# Patient Record
Sex: Male | Born: 1965 | Race: Black or African American | Hispanic: No | Marital: Married | Smoking: Never smoker
Health system: Southern US, Community
[De-identification: ages and names within clinical notes are randomized; demographics above are authoritative.]

## PROBLEM LIST (undated history)

## (undated) DIAGNOSIS — I509 Heart failure, unspecified: Secondary | ICD-10-CM

## (undated) DIAGNOSIS — I10 Essential (primary) hypertension: Secondary | ICD-10-CM

## (undated) DIAGNOSIS — N186 End stage renal disease: Secondary | ICD-10-CM

## (undated) HISTORY — PX: INCISION AND DRAINAGE: SHX5863

## (undated) HISTORY — PX: KNEE SURGERY: SHX244

## (undated) HISTORY — DX: End stage renal disease: N18.6

---

## 2002-09-08 ENCOUNTER — Encounter: Payer: Self-pay | Admitting: *Deleted

## 2002-09-08 ENCOUNTER — Emergency Department (HOSPITAL_COMMUNITY): Admission: EM | Admit: 2002-09-08 | Discharge: 2002-09-08 | Payer: Self-pay | Admitting: *Deleted

## 2003-06-20 ENCOUNTER — Emergency Department (HOSPITAL_COMMUNITY): Admission: EM | Admit: 2003-06-20 | Discharge: 2003-06-20 | Payer: Self-pay | Admitting: Emergency Medicine

## 2003-08-30 ENCOUNTER — Emergency Department (HOSPITAL_COMMUNITY): Admission: EM | Admit: 2003-08-30 | Discharge: 2003-08-30 | Payer: Self-pay | Admitting: Emergency Medicine

## 2005-04-09 ENCOUNTER — Emergency Department (HOSPITAL_COMMUNITY): Admission: EM | Admit: 2005-04-09 | Discharge: 2005-04-09 | Payer: Self-pay | Admitting: Emergency Medicine

## 2005-11-19 ENCOUNTER — Emergency Department (HOSPITAL_COMMUNITY): Admission: EM | Admit: 2005-11-19 | Discharge: 2005-11-19 | Payer: Self-pay | Admitting: *Deleted

## 2006-01-01 ENCOUNTER — Ambulatory Visit: Payer: Self-pay | Admitting: Orthopedic Surgery

## 2006-01-05 ENCOUNTER — Emergency Department (HOSPITAL_COMMUNITY): Admission: EM | Admit: 2006-01-05 | Discharge: 2006-01-05 | Payer: Self-pay | Admitting: Emergency Medicine

## 2006-01-15 ENCOUNTER — Ambulatory Visit (HOSPITAL_COMMUNITY): Admission: RE | Admit: 2006-01-15 | Discharge: 2006-01-15 | Payer: Self-pay | Admitting: Orthopedic Surgery

## 2006-02-05 ENCOUNTER — Ambulatory Visit: Payer: Self-pay | Admitting: Orthopedic Surgery

## 2006-02-19 ENCOUNTER — Encounter (HOSPITAL_COMMUNITY): Admission: RE | Admit: 2006-02-19 | Discharge: 2006-03-21 | Payer: Self-pay | Admitting: Orthopedic Surgery

## 2006-10-08 ENCOUNTER — Ambulatory Visit: Payer: Self-pay | Admitting: Orthopedic Surgery

## 2006-10-26 ENCOUNTER — Ambulatory Visit: Payer: Self-pay | Admitting: Orthopedic Surgery

## 2006-10-26 ENCOUNTER — Ambulatory Visit (HOSPITAL_COMMUNITY): Admission: RE | Admit: 2006-10-26 | Discharge: 2006-10-26 | Payer: Self-pay | Admitting: Orthopedic Surgery

## 2006-10-30 ENCOUNTER — Encounter (HOSPITAL_COMMUNITY): Admission: RE | Admit: 2006-10-30 | Discharge: 2006-11-29 | Payer: Self-pay | Admitting: Orthopedic Surgery

## 2006-11-01 ENCOUNTER — Ambulatory Visit: Payer: Self-pay | Admitting: Orthopedic Surgery

## 2006-11-15 ENCOUNTER — Ambulatory Visit: Payer: Self-pay | Admitting: Orthopedic Surgery

## 2008-10-29 ENCOUNTER — Emergency Department (HOSPITAL_COMMUNITY): Admission: EM | Admit: 2008-10-29 | Discharge: 2008-10-29 | Payer: Self-pay | Admitting: Emergency Medicine

## 2009-05-06 ENCOUNTER — Encounter (INDEPENDENT_AMBULATORY_CARE_PROVIDER_SITE_OTHER): Payer: Self-pay | Admitting: Urology

## 2009-05-06 ENCOUNTER — Ambulatory Visit (HOSPITAL_COMMUNITY): Admission: RE | Admit: 2009-05-06 | Discharge: 2009-05-06 | Payer: Self-pay | Admitting: Urology

## 2010-10-27 LAB — GLUCOSE, CAPILLARY
Glucose-Capillary: 169 mg/dL — ABNORMAL HIGH (ref 70–99)
Glucose-Capillary: 213 mg/dL — ABNORMAL HIGH (ref 70–99)

## 2010-10-27 LAB — BASIC METABOLIC PANEL
BUN: 14 mg/dL (ref 6–23)
CO2: 27 mEq/L (ref 19–32)
Calcium: 9.6 mg/dL (ref 8.4–10.5)
Chloride: 98 mEq/L (ref 96–112)
Creatinine, Ser: 1.01 mg/dL (ref 0.4–1.5)
GFR calc Af Amer: >60 mL/min (ref >60)
GFR calc non Af Amer: >60 mL/min (ref >60)
Glucose, Bld: 248 mg/dL — ABNORMAL HIGH (ref 70–99)
Potassium: 4.1 mEq/L (ref 3.5–5.1)
Sodium: 135 mEq/L (ref 135–145)

## 2010-10-27 LAB — HEMOGLOBIN AND HEMATOCRIT, BLOOD
HCT: 43.6 % (ref 39.0–52.0)
Hemoglobin: 14.6 g/dL (ref 13.0–17.0)

## 2010-12-09 NOTE — H&P (Signed)
NAME:  [name], [name] NO.:  [id number]   MEDICAL RECORD NO.:  [id number]          PATIENT TYPE:  AMB   LOCATION:  DAY                           FACILITY:  [facility]   PHYSICIAN:  Carole Civil, M.D.DATE OF BIRTH:  1966-07-23   DATE OF ADMISSION:  10/23/2006  DATE OF DISCHARGE:  LH                              HISTORY & PHYSICAL   CHIEF COMPLAINT:  Pain, left knee.   Dictation ended at this point.      Carole Civil, M.D.  Electronically Signed     SEH/MEDQ  D:  10/25/2006  T:  10/25/2006  Job:  [id number]

## 2010-12-09 NOTE — Op Note (Signed)
NAME:  [name], [name] NO.:  [id number]   MEDICAL RECORD NO.:  [id number]          PATIENT TYPE:  AMB   LOCATION:  DAY                           FACILITY:  [facility]   PHYSICIAN:  Carole Civil, M.D.DATE OF BIRTH:  1965-12-03   DATE OF PROCEDURE:  10/26/2006  DATE OF DISCHARGE:                               OPERATIVE REPORT   CHIEF COMPLAINT:  Left knee pain.   PREOPERATIVE DIAGNOSIS:  Torn medial meniscus, left knee.   POSTOPERATIVE DIAGNOSIS:  Chondromalacia, osteoarthritis, plaquing  chondrocalcinosis, left knee.   SURGEON:  Carole Civil, M.D.   ASSISTANT:  No assistants.   ANESTHETIC:  Spinal.   OPERATIVE FINDINGS:  The patient had chondromalacia mainly of the  patella and of the tibial plateau.  He had a large joint effusion upon  entry into the joint.  There was a plica of the medial compartment and  there was chondrocalcinosis in the knee.   The patient was identified as [name].  His left knee was marked  for surgery, countersigned by the surgeon.  Antibiotics were started.  He was given a spinal anesthetic once in the operating room and he was  placed supine, left leg placed in a knee holder, right leg in a padded  leg holder.  After sterile prep and drape, a time-out procedure was  completed.  Standard medial and lateral portals were established.  Diagnostic arthroscopy was performed.  Upon entering the joint with the  first portal, a large joint effusion was evacuated.  Diagnostic  arthroscopy was then performed.  Lateral meniscus, ACL, medial meniscus  and patella were all palpated on the second tour of the knee.  We noted  chondrocalcinosis immediately.  He had a medial plica; this was resected  using a shaver and biter.  Her had mild osteoarthritis, grade 2, on the  patella and tibial plateau.  We used the Paragon arthroscopic wand to  perform a chondroplasty; we also used a rasp to smooth the tibial  plateau surface.  We did a  chondroplasty with the Paragon of the  patella.  We irrigated the joint and closed with Steri-Strips.  We  injected 60 mL total of Marcaine, applied sterile dressings and Ace  bandage, Cryo Cuff and he was taken to the recovery room in stable  condition.  Postop plan is for full weightbearing.  I will follow him  next week.  He will have therapy next week.      Carole Civil, M.D.  Electronically Signed    SEH/MEDQ  D:  10/26/2006  T:  10/27/2006  Job:  [id number]

## 2010-12-09 NOTE — H&P (Signed)
NAME:  [name], [name] NO.:  [id number]   MEDICAL RECORD NO.:  [id number]          PATIENT TYPE:  AMB   LOCATION:  DAY                           FACILITY:  [facility]   PHYSICIAN:  Carole Civil, M.D.DATE OF BIRTH:  05-25-1966   DATE OF ADMISSION:  DATE OF DISCHARGE:  LH                              HISTORY & PHYSICAL   CHIEF COMPLAINT:  Left knee pain.   [name] is 45 years old, complains of left knee pain which has been  exacerbated over the last several months, associated with some catching,  locking and mechanical symptoms, severity moderate, timing constant,  quality dull and aching, unmodified by non-operative measures.  He  complains of a history of leg swelling, knee stiffening, fluid in his  lungs, arthritis.   SOCIAL HISTORY:  He is married.  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   REVIEW OF SYSTEMS:  Weight gain, chest pain, shortness of breath,  difficulty breathing, cough, history of joint pain and swelling.   EXAM:  GENERAL:  He is large, mesomorph to endomorphic, normal  development, grooming, hygiene awake, alert and oriented x3.  Pleasant  mood, sensation normal, coordination excellent, reflexes normal.  LYMPHS:  Normal.  CARDIOVASCULAR:  Peripheral pulses normal.  Venous stasis none.  Temperature normal.  No edema.  SKIN:  Normal.  MUSCULOSKELETAL:  Gait and station associated with a limp.  He has  restricted range of motion in the left knee.  The knee appears stable  with negative Lachman test, normal collaterals, normal patellofemoral,  medial joint line tender, meniscal signs questionable but seen positive.   IMPRESSION:  Torn medial meniscus, left knee.  Recommend arthroscopy,  left knee, code [id number].      Carole Civil, M.D.  Electronically Signed     SEH/MEDQ  D:  10/25/2006  T:  10/25/2006  Job:  [id number]   cc:   [facility]

## 2011-07-16 ENCOUNTER — Encounter: Payer: Self-pay | Admitting: *Deleted

## 2011-07-16 ENCOUNTER — Emergency Department (HOSPITAL_COMMUNITY)
Admission: EM | Admit: 2011-07-16 | Discharge: 2011-07-16 | Disposition: A | Discharge status: Home / Self Care | Payer: Managed Care, Other (non HMO) | Attending: Emergency Medicine | Admitting: Emergency Medicine

## 2011-07-16 DIAGNOSIS — R10814 Left lower quadrant abdominal tenderness: Secondary | ICD-10-CM | POA: Insufficient documentation

## 2011-07-16 DIAGNOSIS — L02219 Cutaneous abscess of trunk, unspecified: Secondary | ICD-10-CM | POA: Insufficient documentation

## 2011-07-16 DIAGNOSIS — Z79899 Other long term (current) drug therapy: Secondary | ICD-10-CM | POA: Insufficient documentation

## 2011-07-16 DIAGNOSIS — E119 Type 2 diabetes mellitus without complications: Secondary | ICD-10-CM | POA: Insufficient documentation

## 2011-07-16 DIAGNOSIS — L0291 Cutaneous abscess, unspecified: Secondary | ICD-10-CM

## 2011-07-16 DIAGNOSIS — L03319 Cellulitis of trunk, unspecified: Secondary | ICD-10-CM | POA: Insufficient documentation

## 2011-07-16 MED ORDER — DOXYCYCLINE HYCLATE 100 MG PO TABS
100.0000 mg | ORAL_TABLET | Freq: Once | ORAL | Status: AC
  Administered 2011-07-16: 100 mg via ORAL
  Filled 2011-07-16: qty 1

## 2011-07-16 MED ORDER — HYDROCODONE-ACETAMINOPHEN 5-325 MG PO TABS: 1.0000 | ORAL_TABLET | ORAL | Status: AC | PRN

## 2011-07-16 MED ORDER — HYDROCODONE-ACETAMINOPHEN 5-325 MG PO TABS
2.0000 | ORAL_TABLET | Freq: Once | ORAL | Status: AC
  Administered 2011-07-16: 2 via ORAL
  Filled 2011-07-16: qty 2

## 2011-07-16 MED ORDER — DOXYCYCLINE HYCLATE 100 MG PO CAPS: 100.0000 mg | ORAL_CAPSULE | Freq: Two times a day (BID) | ORAL | Status: DC

## 2011-07-16 NOTE — ED Notes (Addendum)
Pt reports lump in left lower portion of abdomen.  Area surrounding is reddened and warm to touch. Drainage noted from area.

## 2011-07-16 NOTE — ED Provider Notes (Signed)
History     CSN: [id number]  Arrival date & time 07/16/11  0105   First MD Initiated Contact with Patient 07/16/11 0118      Chief Complaint  Patient presents with  . Recurrent Skin Infections    (Consider location/radiation/quality/duration/timing/severity/associated sxs/prior treatment) HPI Comments: [name] is a 45 y.o. male who presents to the Emergency Department complaining of draining sore to his lower abdomen that has been present for two days. Patient states he had tenderness to the left lower abdomen for a week developing a raised erythematous lesion two days ago which opened and began to drain purulent material. He denies fever, chills. He has taken no medicines.   Past Medical History  Diagnosis Date  . Diabetes mellitus     Past Surgical History  Procedure Date  . Knee surgery     History reviewed. No pertinent family history.  History  Substance Use Topics  . Smoking status: Never Smoker   . Smokeless tobacco: Not on file  . Alcohol Use: No      Review of Systems 10 Systems reviewed and are negative for acute change except as noted in the HPI. Allergies  Review of patient's allergies indicates no known allergies.  Home Medications   Current Outpatient Rx  Name Route Sig Dispense Refill  . METFORMIN HCL 1000 MG PO TABS Oral Take 1,000 mg by mouth 2 (two) times daily with a meal.        BP 135/83  Pulse 108  Temp 101.3 F (38.5 C)  Resp 20  Ht 5\' 11"  (1.803 m)  Wt 288 lb (130.636 kg)  BMI 40.17 kg/m2  SpO2 96%  Physical Exam  Nursing note and vitals reviewed. Constitutional: He is oriented to person, place, and time. He appears well-developed and well-nourished.  HENT:  Head: Normocephalic.  Right Ear: External ear normal.  Nose: Nose normal.  Mouth/Throat: Oropharynx is clear and moist.  Eyes: EOM are normal.  Neck: Normal range of motion.  Cardiovascular: Normal rate, normal heart sounds and intact distal pulses.     Pulmonary/Chest: Effort normal and breath sounds normal.  Abdominal: Soft. Bowel sounds are normal.  Musculoskeletal: Normal range of motion.  Neurological: He is alert and oriented to person, place, and time.  Skin:       1 cm raised erythematous nodule to left lower abdomen draining scant amount of purulent material.No fluctuance.    ED Course  Procedures (including critical care time)      MDM  Patient with abscess to the lower abdomen. Initiated antibiotic therapy. Pt stable in ED with no significant deterioration in condition.The patient appears reasonably screened and/or stabilized for discharge and I doubt any other medical condition or other [facility] requiring further screening, evaluation, or treatment in the ED at this time prior to discharge.  MDM Reviewed: nursing note and vitals           Gypsy Balsam. Olin Hauser, MD 07/16/11 [phone]

## 2011-07-16 NOTE — ED Notes (Signed)
Pt states he has a knot on the lower left side of his abdomen. denies any other symptoms

## 2011-07-19 ENCOUNTER — Encounter (HOSPITAL_COMMUNITY): Payer: Self-pay

## 2011-07-19 ENCOUNTER — Inpatient Hospital Stay (HOSPITAL_COMMUNITY)
Admission: AD | Admit: 2011-07-19 | Discharge: 2011-07-24 | DRG: 603 | Disposition: A | Discharge status: Home / Self Care | Payer: Managed Care, Other (non HMO) | Source: Ambulatory Visit | Attending: Family Medicine | Admitting: Family Medicine

## 2011-07-19 DIAGNOSIS — E7800 Pure hypercholesterolemia, unspecified: Secondary | ICD-10-CM | POA: Diagnosis present

## 2011-07-19 DIAGNOSIS — B951 Streptococcus, group B, as the cause of diseases classified elsewhere: Secondary | ICD-10-CM | POA: Diagnosis present

## 2011-07-19 DIAGNOSIS — L02219 Cutaneous abscess of trunk, unspecified: Principal | ICD-10-CM | POA: Diagnosis present

## 2011-07-19 DIAGNOSIS — IMO0001 Reserved for inherently not codable concepts without codable children: Secondary | ICD-10-CM | POA: Diagnosis present

## 2011-07-19 LAB — DIFFERENTIAL
Basophils Absolute: 0 10*3/uL (ref 0.0–0.1)
Basophils Relative: 0 % (ref 0–1)
Eosinophils Absolute: 0 10*3/uL (ref 0.0–0.7)
Eosinophils Relative: 0 % (ref 0–5)
Lymphocytes Relative: 10 % — ABNORMAL LOW (ref 12–46)
Lymphs Abs: 1.7 10*3/uL (ref 0.7–4.0)
Monocytes Absolute: 1.8 10*3/uL — ABNORMAL HIGH (ref 0.1–1.0)
Monocytes Relative: 11 % (ref 3–12)
Neutro Abs: 12.8 10*3/uL — ABNORMAL HIGH (ref 1.7–7.7)
Neutrophils Relative %: 78 % — ABNORMAL HIGH (ref 43–77)

## 2011-07-19 LAB — COMPREHENSIVE METABOLIC PANEL
ALT: 18 U/L (ref 0–53)
AST: 12 U/L (ref 0–37)
Albumin: 2.6 g/dL — ABNORMAL LOW (ref 3.5–5.2)
Alkaline Phosphatase: 147 U/L — ABNORMAL HIGH (ref 39–117)
BUN: 8 mg/dL (ref 6–23)
CO2: 24 mEq/L (ref 19–32)
Calcium: 9.7 mg/dL (ref 8.4–10.5)
Chloride: 89 mEq/L — ABNORMAL LOW (ref 96–112)
Creatinine, Ser: 0.93 mg/dL (ref 0.50–1.35)
GFR calc Af Amer: >90 mL/min (ref >90)
GFR calc non Af Amer: >90 mL/min (ref >90)
Glucose, Bld: 292 mg/dL — ABNORMAL HIGH (ref 70–99)
Potassium: 3.8 mEq/L (ref 3.5–5.1)
Sodium: 128 mEq/L — ABNORMAL LOW (ref 135–145)
Total Bilirubin: 0.4 mg/dL (ref 0.3–1.2)
Total Protein: 7.9 g/dL (ref 6.0–8.3)

## 2011-07-19 LAB — CBC
HCT: 38.4 % — ABNORMAL LOW (ref 39.0–52.0)
Hemoglobin: 13.6 g/dL (ref 13.0–17.0)
MCH: 27.4 pg (ref 26.0–34.0)
MCHC: 35.4 g/dL (ref 30.0–36.0)
MCV: 77.3 fL — ABNORMAL LOW (ref 78.0–100.0)
Platelets: 332 10*3/uL (ref 150–400)
RBC: 4.97 MIL/uL (ref 4.22–5.81)
RDW: 12.8 % (ref 11.5–15.5)
WBC: 16.4 10*3/uL — ABNORMAL HIGH (ref 4.0–10.5)

## 2011-07-19 LAB — GLUCOSE, CAPILLARY: Glucose-Capillary: 258 mg/dL — ABNORMAL HIGH (ref 70–99)

## 2011-07-19 MED ORDER — SODIUM CHLORIDE 0.9 % IJ SOLN
INTRAMUSCULAR | Status: AC
  Administered 2011-07-19: 17:00:00
  Filled 2011-07-19: qty 3

## 2011-07-19 MED ORDER — HYDROMORPHONE HCL 4 MG PO TABS
4.0000 mg | ORAL_TABLET | ORAL | Status: DC | PRN
  Administered 2011-07-19: 4 mg via ORAL
  Filled 2011-07-19: qty 1

## 2011-07-19 MED ORDER — ONDANSETRON HCL 4 MG/2ML IJ SOLN: 8.0000 mg | Freq: Four times a day (QID) | INTRAMUSCULAR | Status: DC | PRN

## 2011-07-19 MED ORDER — DOXYCYCLINE HYCLATE 100 MG PO TABS
100.0000 mg | ORAL_TABLET | Freq: Two times a day (BID) | ORAL | Status: DC
  Administered 2011-07-19 – 2011-07-24 (×9): 100 mg via ORAL
  Filled 2011-07-19 (×10): qty 1

## 2011-07-19 MED ORDER — INSULIN ASPART 100 UNIT/ML ~~LOC~~ SOLN
0.0000 [IU] | Freq: Three times a day (TID) | SUBCUTANEOUS | Status: DC
Start: 2011-07-19 — End: 2011-07-24
  Administered 2011-07-19: 8 [IU] via SUBCUTANEOUS
  Administered 2011-07-20: 11 [IU] via SUBCUTANEOUS
  Administered 2011-07-20 (×2): 8 [IU] via SUBCUTANEOUS
  Administered 2011-07-21: 11 [IU] via SUBCUTANEOUS
  Administered 2011-07-22: 5 [IU] via SUBCUTANEOUS
  Administered 2011-07-22: 3 [IU] via SUBCUTANEOUS
  Administered 2011-07-22 – 2011-07-23 (×3): 8 [IU] via SUBCUTANEOUS
  Administered 2011-07-23 – 2011-07-24 (×3): 5 [IU] via SUBCUTANEOUS

## 2011-07-19 MED ORDER — HYDROMORPHONE HCL PF 1 MG/ML IJ SOLN
4.0000 mg | INTRAMUSCULAR | Status: DC | PRN
  Administered 2011-07-20: 4 mg via INTRAVENOUS
  Filled 2011-07-19: qty 4

## 2011-07-19 MED ORDER — METRONIDAZOLE IN NACL 5-0.79 MG/ML-% IV SOLN
500.0000 mg | Freq: Three times a day (TID) | INTRAVENOUS | Status: DC
  Administered 2011-07-19 – 2011-07-24 (×15): 500 mg via INTRAVENOUS
  Filled 2011-07-19 (×21): qty 100

## 2011-07-19 MED ORDER — HYDROMORPHONE HCL PF 1 MG/ML IJ SOLN: 4.0000 mg | INTRAMUSCULAR | Status: DC | PRN

## 2011-07-19 MED ORDER — INSULIN ASPART 100 UNIT/ML ~~LOC~~ SOLN
0.0000 [IU] | Freq: Every day | SUBCUTANEOUS | Status: DC
  Administered 2011-07-19: 4 [IU] via SUBCUTANEOUS
  Administered 2011-07-20: 2 [IU] via SUBCUTANEOUS
  Administered 2011-07-21 – 2011-07-22 (×2): 3 [IU] via SUBCUTANEOUS

## 2011-07-19 MED ORDER — ACETAMINOPHEN 500 MG PO TABS
500.0000 mg | ORAL_TABLET | ORAL | Status: DC | PRN
  Administered 2011-07-19: 500 mg via ORAL
  Filled 2011-07-19 (×2): qty 1

## 2011-07-19 MED ORDER — HYDROMORPHONE HCL 4 MG PO TABS
4.0000 mg | ORAL_TABLET | ORAL | Status: DC | PRN
  Administered 2011-07-20 (×3): 4 mg via ORAL
  Filled 2011-07-19 (×3): qty 1

## 2011-07-19 MED ORDER — HYDROMORPHONE HCL PF 2 MG/ML IJ SOLN: 4.0000 mg | INTRAMUSCULAR | Status: DC | PRN

## 2011-07-19 MED ORDER — HYDROMORPHONE HCL 4 MG PO TABS: 4.0000 mg | ORAL_TABLET | ORAL | Status: DC | PRN

## 2011-07-19 MED ORDER — METFORMIN HCL 500 MG PO TABS
1000.0000 mg | ORAL_TABLET | Freq: Two times a day (BID) | ORAL | Status: DC
  Administered 2011-07-19 – 2011-07-24 (×9): 1000 mg via ORAL
  Filled 2011-07-19 (×9): qty 2

## 2011-07-19 MED ORDER — DEXTROSE 5 % IV SOLN
1.0000 g | INTRAVENOUS | Status: DC
  Administered 2011-07-19 – 2011-07-23 (×5): 1 g via INTRAVENOUS
  Filled 2011-07-19 (×7): qty 10

## 2011-07-19 MED ORDER — SODIUM CHLORIDE 0.9 % IV SOLN
INTRAVENOUS | Status: DC
  Administered 2011-07-19 – 2011-07-20 (×3): via INTRAVENOUS

## 2011-07-20 ENCOUNTER — Inpatient Hospital Stay (HOSPITAL_COMMUNITY): Payer: Managed Care, Other (non HMO)

## 2011-07-20 LAB — GLUCOSE, CAPILLARY
Glucose-Capillary: 269 mg/dL — ABNORMAL HIGH (ref 70–99)
Glucose-Capillary: 258 mg/dL — ABNORMAL HIGH (ref 70–99)
Glucose-Capillary: 303 mg/dL — ABNORMAL HIGH (ref 70–99)
Glucose-Capillary: 250 mg/dL — ABNORMAL HIGH (ref 70–99)

## 2011-07-20 LAB — SURGICAL PCR SCREEN
MRSA, PCR: NEGATIVE
Staphylococcus aureus: NEGATIVE

## 2011-07-20 MED ORDER — ENOXAPARIN SODIUM 40 MG/0.4ML ~~LOC~~ SOLN
40.0000 mg | SUBCUTANEOUS | Status: AC
  Administered 2011-07-21: 40 mg via SUBCUTANEOUS

## 2011-07-20 MED ORDER — POTASSIUM CHLORIDE IN NACL 40-0.9 MEQ/L-% IV SOLN
INTRAVENOUS | Status: DC
  Administered 2011-07-20: via INTRAVENOUS
  Filled 2011-07-20 (×9): qty 1000

## 2011-07-20 MED ORDER — SODIUM CHLORIDE 0.9 % IJ SOLN
INTRAMUSCULAR | Status: AC
  Administered 2011-07-20: 16:00:00
  Filled 2011-07-20: qty 3

## 2011-07-20 MED ORDER — POTASSIUM CHLORIDE IN NACL 40-0.9 MEQ/L-% IV SOLN
INTRAVENOUS | Status: AC
  Filled 2011-07-20: qty 1000

## 2011-07-20 MED ORDER — ENOXAPARIN SODIUM 40 MG/0.4ML ~~LOC~~ SOLN: 40.0000 mg | Freq: Once | SUBCUTANEOUS | Status: DC

## 2011-07-20 NOTE — Progress Notes (Signed)
07/20/11 1142 Patient rated pain 9/10 despite receiving dilaudid 4 mg po tablet as ordered, also has order for dilaudid 4 mg IV. Notified Dr Everette Rank ( on call for dr Karie Kirks) of patient's persistent pain this morning, order received to go ahead and give dose of dilaudid IV as ordered, even though had not been 4 hours. Patient given dilaudid IV per m dickerson, RN. On reassessment, stated "took all my pain away, this is the first time i've been pain free". Nursing to monitor.

## 2011-07-20 NOTE — Progress Notes (Signed)
07/20/11 1650 Surgical PCR obtained and results negative for both MRSA and staph.

## 2011-07-20 NOTE — Progress Notes (Signed)
NAMETENNYSON, [name] NO.:  [id number]  MEDICAL RECORD NO.:  [id number]  LOCATION:  A306                          FACILITY:  [facility]  PHYSICIAN:  Estill Bamberg. Karie Kirks, M.D.DATE OF BIRTH:  01-Jul-1966  DATE OF PROCEDURE: DATE OF DISCHARGE:                                PROGRESS NOTE   SUBJECTIVE:  He is feeling a good deal of pain from his abscess.  He has been getting insulin on a regular basis.  OBJECTIVE:  His temp is 98.3, pulse 91, respiratory 20 blood pressure 145/83.  He is somewhat recumbent in bed.  Well-developed and morbidly obese.  His speech is normal.  His lungs are clear throughout.  His heart has a regular rhythm.  Rate of about 80.  His abdomen is soft, but in the inferior abdomen is tender with abscess is.  His white cell count on admission was 16,400, of which 78% neutrophils. Hemoglobin is 13.6, with an MCV of 77.3, platelet count of 332,000.  His serum sodium is 128, chloride 89, and glucose 292, alk phos 147, albumin 2.6.  Sugars have been in the 200 range since admission.  Wound cultures pending.  Few gram-negative rods are noted on Gram stain.  ASSESSMENT: 1. Abdominal abscess. 2. Uncontrolled type 2 diabetes mellitus. 3. Morbid obesity. 4. Electrolyte abnormalities.  PLAN:  Continue the metronidazole and Rocephin IV.  He has been seen by the general surgeon, Dr. Arnoldo Morale, general surgeon and will have an I and D tomorrow.     Estill Bamberg. Karie Kirks, M.D.     SDK/MEDQ  D:  07/20/2011  T:  07/20/2011  Job:  [id number]

## 2011-07-20 NOTE — Progress Notes (Signed)
Reason for Consult: Suprapubic abscess Referring Physician: Dr. Riley [name] is an 45 y.o. [name].  HPI: Patient is a [name] with multiple medical problems including diabetes mellitus who presents with a one-month history of worsening cellulitis and drainage in the suprapubic area over the trunk. He has had drainage from a previous wound, but has developed worsening suprapubic pain and swelling have a point inferior to the drainage part. He was noted to the hospital by Dr. [name] for control of his diabetes and intravenous antibiotic therapy. Surgery consultation is being obtained for drainage of the abscess.  Past Medical History  Diagnosis Date  . Diabetes mellitus     Past Surgical History  Procedure Date  . Knee surgery     No family history on file.  Social History:  reports that he has never smoked. He does not have any smokeless tobacco history on file. He reports that he does not drink alcohol or use illicit drugs.  Allergies: No Known Allergies  Medications: I have reviewed the patient's current medications.  Results for orders placed during the hospital encounter of 07/19/11 (from the past 48 hour(s))  WOUND CULTURE     Status: Normal (Preliminary result)   Collection Time   07/19/11  4:46 PM      Component Value Range Comment   Specimen Description OTHER      Special Requests Normal      Gram Stain        Value: RARE WBC PRESENT, PREDOMINANTLY PMN     NO SQUAMOUS EPITHELIAL CELLS SEEN     FEW GRAM NEGATIVE RODS   Culture NO GROWTH      Report Status PENDING     CBC     Status: Abnormal   Collection Time   07/19/11  5:01 PM      Component Value Range Comment   WBC 16.4 (*) 4.0 - 10.5 (K/uL)    RBC 4.97  4.22 - 5.81 (MIL/uL)    Hemoglobin 13.6  13.0 - 17.0 (g/dL)    HCT 38.4 (*) 39.0 - 52.0 (%)    MCV 77.3 (*) 78.0 - 100.0 (fL)    MCH 27.4  26.0 - 34.0 (pg)    MCHC 35.4  30.0 - 36.0 (g/dL)    RDW 12.8  11.5 - 15.5 (%)     Platelets 332  150 - 400 (K/uL)   DIFFERENTIAL     Status: Abnormal   Collection Time   07/19/11  5:01 PM      Component Value Range Comment   Neutrophils Relative 78 (*) 43 - 77 (%)    Neutro Abs 12.8 (*) 1.7 - 7.7 (K/uL)    Lymphocytes Relative 10 (*) 12 - 46 (%)    Lymphs Abs 1.7  0.7 - 4.0 (K/uL)    Monocytes Relative 11  3 - 12 (%)    Monocytes Absolute 1.8 (*) 0.1 - 1.0 (K/uL)    Eosinophils Relative 0  0 - 5 (%)    Eosinophils Absolute 0.0  0.0 - 0.7 (K/uL)    Basophils Relative 0  0 - 1 (%)    Basophils Absolute 0.0  0.0 - 0.1 (K/uL)   COMPREHENSIVE METABOLIC PANEL     Status: Abnormal   Collection Time   07/19/11  5:01 PM      Component Value Range Comment   Sodium 128 (*) 135 - 145 (mEq/L)    Potassium 3.8  3.5 - 5.1 (mEq/L)  Chloride 89 (*) 96 - 112 (mEq/L)    CO2 24  19 - 32 (mEq/L)    Glucose, Bld 292 (*) 70 - 99 (mg/dL)    BUN 8  6 - 23 (mg/dL)    Creatinine, Ser 0.93  0.50 - 1.35 (mg/dL)    Calcium 9.7  8.4 - 10.5 (mg/dL)    Total Protein 7.9  6.0 - 8.3 (g/dL)    Albumin 2.6 (*) 3.5 - 5.2 (g/dL)    AST 12  0 - 37 (U/L)    ALT 18  0 - 53 (U/L)    Alkaline Phosphatase 147 (*) 39 - 117 (U/L)    Total Bilirubin 0.4  0.3 - 1.2 (mg/dL)    GFR calc non Af Amer >90  >90 (mL/min)    GFR calc Af Amer >90  >90 (mL/min)   GLUCOSE, CAPILLARY     Status: Abnormal   Collection Time   07/19/11  5:23 PM      Component Value Range Comment   Glucose-Capillary 258 (*) 70 - 99 (mg/dL)    Comment 1 Notify RN      Comment 2 Documented in Chart     GLUCOSE, CAPILLARY     Status: Abnormal   Collection Time   07/19/11  9:07 PM      Component Value Range Comment   Glucose-Capillary 306 (*) 70 - 99 (mg/dL)   GLUCOSE, CAPILLARY     Status: Abnormal   Collection Time   07/20/11  7:22 AM      Component Value Range Comment   Glucose-Capillary 269 (*) 70 - 99 (mg/dL)    Comment 1 Notify RN      Comment 2 Documented in Chart       No results found.  ROS: See chart Blood  pressure 145/83, pulse 91, temperature 98.3 F (36.8 C), temperature source Oral, resp. rate 20, height 5\' 11"  (1.803 m), weight 132 kg (291 lb 0.1 oz), SpO2 96.00%. Physical Exam: Mildly obese black [name] in no acute distress. In the suprapubic area towards the left side, the patient has a large fluctuant, indurated mass. A small draining sinus is noted superior to this in the groin crease.  Assessment/Plan: Suprapubic abscess Plan: Patient will be taken to the operating room tomorrow for incision and drainage of the suprapubic abscess. The risks and benefits of the procedure were fully explained to the patient, gave informed consent.  Anyiah Coverdale A 07/20/2011, 8:02 AM

## 2011-07-20 NOTE — Progress Notes (Signed)
Inpatient Diabetes Program Recommendations  AACE/ADA: New Consensus Statement on Inpatient Glycemic Control (2009)  Target Ranges:  Prepandial:   less than 140 mg/dL      Peak postprandial:   less than 180 mg/dL (1-2 hours)      Critically ill patients:  140 - 180 mg/dL   Reason for Visit: Elevated fasting glucose: 269 mg/dL  Inpatient Diabetes Program Recommendations Insulin - Basal: Add Lantus 25 units daily:  (132 kg * 0.2= 26.4 units of basal insulin) HgbA1C: Check HgbA1C to assess glycemic control

## 2011-07-20 NOTE — Progress Notes (Signed)
07/20/11 1522 Notified radiology of patient's order for CXR preop, surgery tomorrow. Also notified respiratory therapy of order for EKG preop, stated would be done first thing in the morning.

## 2011-07-20 NOTE — H&P (Signed)
[name], [name] NO.:  [id number]  MEDICAL RECORD NO.:  [id number]  LOCATION:  A306                          FACILITY:  [facility]  PHYSICIAN:  Estill Bamberg. Karie Kirks, M.D.DATE OF BIRTH:  1965-09-14  DATE OF ADMISSION:  07/19/2011 DATE OF DISCHARGE:  LH                             HISTORY & PHYSICAL   HISTORY OF PRESENT ILLNESS:  This 45 year old man presented to the office today with an abscess to left lower abdomen.  He was in exquisite pain.  He had been seen at the [facility] emergency room 3 nights ago and started on doxycycline 100 mg b.i.d. but really was not significantly better.  The abscess has really been bothering him for about 4 weeks, 2 weeks quite badly.  He has been having draining from the abscess for 2 weeks.  He was in [place] when it started draining.  CURRENT MEDICATIONS:  Include: 1. Doxycycline 100 mg b.i.d. 2. Metformin 1000 mg b.i.d. 3. Hydrocodone/acetaminophen 5/325 q.4 hours p.r.n. pain. He ran out of glyburide about a month ago.  He was supposed to be on pravastatin 40 mg daily but did not really know whether he was taking his.  SOCIAL HISTORY:  He works as a Animal nutritionist.  He is married, and his [place] lives in the [place].  ADMISSION EXAM:  GENERAL:  An obese middle-aged man.  He was in exquisite pain. VITAL SIGNS:  His weight was 275 pounds.  His height is 69-1/2 inches. His BMI is 40.  Blood pressure 136/74, pulse 92.  His weight was down from 292 pounds back in May. HEART:  Regular rhythm, rate of 90. LUNGS:  Clear throughout. ABDOMEN:  He had an abscess oozing a whitish yellow material noted in the left inguinal region.  He had severe swelling and erythema and tenderness of the superior aspect of the mons pubis.  ADMISSION DIAGNOSES:  Include: 1. Abdominal wall abscess. 2. Type 2 diabetes, poorly controlled. 3. Morbid obesity. 4. Hypercholesterolemia. I have discussed his case with Dr. Arnoldo Morale,  general surgeon.  The patient will be on IV fluids, IV pain meds, Rocephin and metronidazole IV.  Culture of the infection is pending.  He will be on sliding-scale insulin, and I will continue him on metformin at this point.  A CBC and CMP are pending.     Estill Bamberg. Karie Kirks, M.D.     SDK/MEDQ  D:  07/19/2011  T:  07/20/2011  Job:  [id number]

## 2011-07-21 ENCOUNTER — Encounter (HOSPITAL_COMMUNITY): Payer: Self-pay | Admitting: Anesthesiology

## 2011-07-21 ENCOUNTER — Other Ambulatory Visit: Payer: Self-pay

## 2011-07-21 ENCOUNTER — Inpatient Hospital Stay (HOSPITAL_COMMUNITY): Payer: Managed Care, Other (non HMO) | Admitting: Anesthesiology

## 2011-07-21 ENCOUNTER — Encounter (HOSPITAL_COMMUNITY): Payer: Self-pay | Admitting: *Deleted

## 2011-07-21 ENCOUNTER — Encounter (HOSPITAL_COMMUNITY)
Admission: AD | Disposition: A | Discharge status: Home / Self Care | Payer: Self-pay | Source: Ambulatory Visit | Attending: Family Medicine

## 2011-07-21 LAB — CBC
HCT: 36.1 % — ABNORMAL LOW (ref 39.0–52.0)
MCH: 26.6 pg (ref 26.0–34.0)
MCV: 78.1 fL (ref 78.0–100.0)
Platelets: 323 10*3/uL (ref 150–400)
RDW: 12.8 % (ref 11.5–15.5)
WBC: 13.6 10*3/uL — ABNORMAL HIGH (ref 4.0–10.5)

## 2011-07-21 LAB — BASIC METABOLIC PANEL
BUN: 9 mg/dL (ref 6–23)
Calcium: 8.9 mg/dL (ref 8.4–10.5)
Chloride: 93 mEq/L — ABNORMAL LOW (ref 96–112)
Creatinine, Ser: 0.83 mg/dL (ref 0.50–1.35)
GFR calc Af Amer: >90 mL/min (ref >90)

## 2011-07-21 LAB — GLUCOSE, CAPILLARY
Glucose-Capillary: 249 mg/dL — ABNORMAL HIGH (ref 70–99)
Glucose-Capillary: 237 mg/dL — ABNORMAL HIGH (ref 70–99)
Glucose-Capillary: 265 mg/dL — ABNORMAL HIGH (ref 70–99)

## 2011-07-21 SURGERY — INCISION AND DRAINAGE, ABSCESS
Anesthesia: General | Wound class: Dirty or Infected

## 2011-07-21 MED ORDER — POTASSIUM CHLORIDE IN NACL 20-0.9 MEQ/L-% IV SOLN
INTRAVENOUS | Status: DC
  Administered 2011-07-21 – 2011-07-23 (×3): via INTRAVENOUS

## 2011-07-21 MED ORDER — ACETAMINOPHEN 10 MG/ML IV SOLN
INTRAVENOUS | Status: AC
  Filled 2011-07-21: qty 100

## 2011-07-21 MED ORDER — GLYCOPYRROLATE 0.2 MG/ML IJ SOLN
INTRAMUSCULAR | Status: AC
  Administered 2011-07-21: 0.2 mg via INTRAVENOUS
  Filled 2011-07-21: qty 1

## 2011-07-21 MED ORDER — PROPOFOL 10 MG/ML IV EMUL
INTRAVENOUS | Status: AC
  Filled 2011-07-21: qty 20

## 2011-07-21 MED ORDER — SODIUM CHLORIDE 0.9 % IR SOLN
Status: DC | PRN
  Administered 2011-07-21: 1000 mL

## 2011-07-21 MED ORDER — GLYCOPYRROLATE 0.2 MG/ML IJ SOLN
0.2000 mg | Freq: Once | INTRAMUSCULAR | Status: AC | PRN
  Administered 2011-07-21: 0.2 mg via INTRAVENOUS

## 2011-07-21 MED ORDER — PROPOFOL 10 MG/ML IV EMUL
INTRAVENOUS | Status: DC | PRN
  Administered 2011-07-21: 150 mg via INTRAVENOUS
  Administered 2011-07-21: 50 mg via INTRAVENOUS
  Administered 2011-07-21 (×2): 100 mg via INTRAVENOUS

## 2011-07-21 MED ORDER — LACTATED RINGERS IV SOLN
INTRAVENOUS | Status: DC
  Administered 2011-07-21: 1000 mL via INTRAVENOUS

## 2011-07-21 MED ORDER — ONDANSETRON HCL 4 MG PO TABS: 4.0000 mg | ORAL_TABLET | Freq: Four times a day (QID) | ORAL | Status: DC | PRN

## 2011-07-21 MED ORDER — ACETAMINOPHEN 325 MG PO TABS: 325.0000 mg | ORAL_TABLET | ORAL | Status: DC | PRN

## 2011-07-21 MED ORDER — ENOXAPARIN SODIUM 40 MG/0.4ML ~~LOC~~ SOLN
SUBCUTANEOUS | Status: AC
  Administered 2011-07-21: 40 mg via SUBCUTANEOUS
  Filled 2011-07-21: qty 0.4

## 2011-07-21 MED ORDER — HYDROMORPHONE HCL PF 1 MG/ML IJ SOLN: 2.0000 mg | INTRAMUSCULAR | Status: DC | PRN

## 2011-07-21 MED ORDER — MIDAZOLAM HCL 2 MG/2ML IJ SOLN
1.0000 mg | INTRAMUSCULAR | Status: DC | PRN
  Administered 2011-07-21: 2 mg via INTRAVENOUS

## 2011-07-21 MED ORDER — FENTANYL CITRATE 0.05 MG/ML IJ SOLN
INTRAMUSCULAR | Status: DC | PRN
  Administered 2011-07-21 (×2): 50 ug via INTRAVENOUS

## 2011-07-21 MED ORDER — FENTANYL CITRATE 0.05 MG/ML IJ SOLN: 25.0000 ug | INTRAMUSCULAR | Status: DC | PRN

## 2011-07-21 MED ORDER — ACETAMINOPHEN 10 MG/ML IV SOLN
1000.0000 mg | Freq: Four times a day (QID) | INTRAVENOUS | Status: AC
  Administered 2011-07-21 – 2011-07-22 (×4): 1000 mg via INTRAVENOUS
  Filled 2011-07-21 (×3): qty 100

## 2011-07-21 MED ORDER — HYDROMORPHONE HCL 4 MG PO TABS
4.0000 mg | ORAL_TABLET | ORAL | Status: DC | PRN
  Administered 2011-07-21 – 2011-07-24 (×11): 4 mg via ORAL
  Filled 2011-07-21 (×5): qty 1
  Filled 2011-07-21: qty 2
  Filled 2011-07-21 (×5): qty 1

## 2011-07-21 MED ORDER — ENOXAPARIN SODIUM 40 MG/0.4ML ~~LOC~~ SOLN
40.0000 mg | SUBCUTANEOUS | Status: DC
  Administered 2011-07-22 – 2011-07-24 (×3): 40 mg via SUBCUTANEOUS
  Filled 2011-07-21 (×3): qty 0.4

## 2011-07-21 MED ORDER — MIDAZOLAM HCL 2 MG/2ML IJ SOLN
INTRAMUSCULAR | Status: AC
  Administered 2011-07-21: 2 mg via INTRAVENOUS
  Filled 2011-07-21: qty 2

## 2011-07-21 MED ORDER — ONDANSETRON HCL 4 MG/2ML IJ SOLN
INTRAMUSCULAR | Status: AC
  Administered 2011-07-21: 4 mg via INTRAVENOUS
  Filled 2011-07-21: qty 2

## 2011-07-21 MED ORDER — ONDANSETRON HCL 4 MG/2ML IJ SOLN: 4.0000 mg | Freq: Four times a day (QID) | INTRAMUSCULAR | Status: DC | PRN

## 2011-07-21 MED ORDER — FENTANYL CITRATE 0.05 MG/ML IJ SOLN
INTRAMUSCULAR | Status: AC
  Filled 2011-07-21: qty 2

## 2011-07-21 MED ORDER — ONDANSETRON HCL 4 MG/2ML IJ SOLN
4.0000 mg | Freq: Once | INTRAMUSCULAR | Status: AC
  Administered 2011-07-21: 4 mg via INTRAVENOUS

## 2011-07-21 MED ORDER — ONDANSETRON HCL 4 MG/2ML IJ SOLN: 4.0000 mg | Freq: Once | INTRAMUSCULAR | Status: DC | PRN

## 2011-07-21 SURGICAL SUPPLY — 26 items
BAG HAMPER (MISCELLANEOUS) ×2 IMPLANT
BANDAGE CONFORM 2  STR LF (GAUZE/BANDAGES/DRESSINGS) IMPLANT
CLOTH BEACON ORANGE TIMEOUT ST (SAFETY) ×2 IMPLANT
COVER LIGHT HANDLE STERIS (MISCELLANEOUS) ×4 IMPLANT
ELECT REM PT RETURN 9FT ADLT (ELECTROSURGICAL) ×2
ELECTRODE REM PT RTRN 9FT ADLT (ELECTROSURGICAL) ×1 IMPLANT
GAUZE PACKING IODOFORM 2 (PACKING) ×2 IMPLANT
GLOVE BIOGEL M STRL SZ7.5 (GLOVE) ×2 IMPLANT
GLOVE BIOGEL PI IND STRL 7.5 (GLOVE) ×1 IMPLANT
GLOVE BIOGEL PI INDICATOR 7.5 (GLOVE) ×1
GLOVE ECLIPSE 7.0 STRL STRAW (GLOVE) ×2 IMPLANT
GOWN STRL REIN XL XLG (GOWN DISPOSABLE) ×4 IMPLANT
KIT ROOM TURNOVER APOR (KITS) ×2 IMPLANT
MANIFOLD NEPTUNE II (INSTRUMENTS) ×2 IMPLANT
MARKER SKIN DUAL TIP RULER LAB (MISCELLANEOUS) IMPLANT
NS IRRIG 1000ML POUR BTL (IV SOLUTION) ×2 IMPLANT
PACK BASIC LIMB (CUSTOM PROCEDURE TRAY) IMPLANT
PACK MINOR (CUSTOM PROCEDURE TRAY) ×2 IMPLANT
PAD ABD 5X9 TENDERSORB (GAUZE/BANDAGES/DRESSINGS) ×2 IMPLANT
PAD ARMBOARD 7.5X6 YLW CONV (MISCELLANEOUS) ×2 IMPLANT
SET BASIN LINEN APH (SET/KITS/TRAYS/PACK) ×2 IMPLANT
SPONGE GAUZE 4X4 12PLY (GAUZE/BANDAGES/DRESSINGS) ×2 IMPLANT
SWAB CULTURE LIQ STUART DBL (MISCELLANEOUS) ×2 IMPLANT
SYR BULB IRRIGATION 50ML (SYRINGE) IMPLANT
TAPE CLOTH SURG 4X10 WHT LF (GAUZE/BANDAGES/DRESSINGS) ×2 IMPLANT
TUBE ANAEROBIC PORT A CUL  W/M (MISCELLANEOUS) ×2 IMPLANT

## 2011-07-21 NOTE — Transfer of Care (Signed)
Immediate Anesthesia Transfer of Care Note  Patient: [name]  Procedure(s) Performed:  INCISION AND DRAINAGE ABSCESS - Suprapubic abscess  Patient Location: PACU  Anesthesia Type: General  Level of Consciousness: awake  Airway & Oxygen Therapy: Patient Spontanous Breathing and non-rebreather face mask  Post-op Assessment: Report given to PACU RN, Post -op Vital signs reviewed and stable and Patient moving all extremities  Post vital signs: Reviewed and stable  Complications: No apparent anesthesia complications

## 2011-07-21 NOTE — Anesthesia Preprocedure Evaluation (Signed)
Anesthesia Evaluation  Patient identified by MRN, date of birth, ID band Patient awake    Reviewed: Allergy & Precautions, H&P , NPO status , Patient's Chart, lab work & pertinent test results  History of Anesthesia Complications Negative for: history of anesthetic complications  Airway Mallampati: I TM Distance: >3 FB Neck ROM: Full    Dental No notable dental hx.    Pulmonary neg pulmonary ROS,    Pulmonary exam normal       Cardiovascular neg cardio ROS Regular Normal    Neuro/Psych Negative Neurological ROS  Negative Psych ROS   GI/Hepatic negative GI ROS, Neg liver ROS,   Endo/Other  Diabetes mellitus-, Poorly Controlled, Type 2, Oral Hypoglycemic AgentsMorbid obesity  Renal/GU negative Renal ROS     Musculoskeletal negative musculoskeletal ROS (+)   Abdominal (+) obese,  Abdomen: soft.    Peds  Hematology  (+) Blood dyscrasia, anemia ,   Anesthesia Other Findings   Reproductive/Obstetrics                           Anesthesia Physical Anesthesia Plan  ASA: II  Anesthesia Plan: General   Post-op Pain Management:    Induction: Intravenous  Airway Management Planned: LMA  Additional Equipment:   Intra-op Plan:   Post-operative Plan: Extubation in OR  Informed Consent: I have reviewed the patients History and Physical, chart, labs and discussed the procedure including the risks, benefits and alternatives for the proposed anesthesia with the patient or authorized representative who has indicated his/her understanding and acceptance.     Plan Discussed with: CRNA  Anesthesia Plan Comments:         Anesthesia Quick Evaluation

## 2011-07-21 NOTE — Anesthesia Postprocedure Evaluation (Signed)
Anesthesia Post Note  Patient: [name]  Procedure(s) Performed:  INCISION AND DRAINAGE ABSCESS - Suprapubic abscess  Anesthesia type: General  Patient location: PACU  Post pain: Pain level controlled  Post assessment: Post-op Vital signs reviewed, Patient's Cardiovascular Status Stable, Respiratory Function Stable, Patent Airway, No signs of Nausea or vomiting and Pain level controlled  Last Vitals:  Filed Vitals:   07/21/11 1025  BP: 143/79  Pulse: 80  Temp: 36.6 C  Resp: 12    Post vital signs: Reviewed and stable  Level of consciousness: awake and alert   Complications: No apparent anesthesia complications

## 2011-07-21 NOTE — Progress Notes (Signed)
CARE MANAGEMENT NOTE 07/21/2011  Patient:  [name], [name]   Account Number:  [id number]  Date Initiated:  07/21/2011  Documentation initiated by:  Claretha Cooper  Subjective/Objective Assessment:   Pt admitted with lower abd abscess, suprapubic. PTA, lived at home with spouse.     Action/Plan:   Dc home with no identified HH needs   Anticipated DC Date:  07/22/2011   Anticipated DC Plan:  [place]  CM consult      Choice offered to / List presented to:             Status of service:  In process, will continue to follow Medicare Important Message given?   (If response is "NO", the following Medicare IM given date fields will be blank) Date Medicare IM given:   Date Additional Medicare IM given:    Discharge Disposition:    Per UR Regulation:    Comments:  07/21/11 [place]

## 2011-07-21 NOTE — Op Note (Signed)
Patient:  [name]  DOB:  1966-04-24  MRN:  [id number]   Preop Diagnosis:  Abscess, abdominal wall, suprapubic region  Postop Diagnosis:  Same  Procedure:  Incision and drainage of suprapubic abscess, abdominal wall  Surgeon:  Aviva Signs, M.D.  Anes:  General   Indications:  Patient is a 45 year old black male with diabetes mellitus who presents with a one-month history of worsening cellulitis and drainage from a suprapubic abscess. Patient now comes the operating room for incision and drainage of the abscess. The risks and benefits of the procedure were fully explained to the patient, gave informed consent.  Procedure note:  Patient was placed in the supine position after general anesthesia was administered. The suprapubic region was prepped and draped using usual sterile technique with Betadine. Surgical site confirmation was performed.  An incision was made in the suprapubic region down to the subcutaneous tissue. On further blunt dissection, a large abscess cavity was noted in the left suprapubic region, extending superiorly to the abdominal wall in the left groin region. Aerobic and anaerobic cultures were taken and sent to microbiology. The wound was copious irrigated normal saline. Any bleeding was controlled using Bovie electrocautery. Iodoform new gauze was then packed into the wound. A dry sterile dressing was then applied.  All tape and needle counts were correct the end of the procedure. Patient was awakened and transferred to PACU in stable condition.  Complications:  None  EBL:  10 cc  Specimen:  Aerobic and anaerobic cultures of abdominal wall abscess

## 2011-07-21 NOTE — Anesthesia Procedure Notes (Signed)
Procedure Name: LMA Insertion Date/Time: 07/21/2011 9:54 AM Performed by: Drucie Opitz Pre-anesthesia Checklist: Patient identified, Patient being monitored, Emergency Drugs available, Timeout performed and Suction available Patient Re-evaluated:Patient Re-evaluated prior to inductionOxygen Delivery Method: Circle System Utilized Preoxygenation: Pre-oxygenation with 100% oxygen Intubation Type: IV induction Ventilation: Mask ventilation with difficulty LMA: LMA inserted LMA Size: 4.0 Number of attempts: 1 Placement Confirmation: positive ETCO2 and breath sounds checked- equal and bilateral Comments: Leak with LMA 4 will remove and use 5 LMA    Procedure Name: LMA Insertion Date/Time: 07/21/2011 9:59 AM Performed by: Drucie Opitz Pre-anesthesia Checklist: Patient identified, Patient being monitored, Emergency Drugs available, Timeout performed and Suction available Patient Re-evaluated:Patient Re-evaluated prior to inductionOxygen Delivery Method: Circle System Utilized Preoxygenation: Pre-oxygenation with 100% oxygen Intubation Type: IV induction LMA: LMA inserted LMA Size: 4.0 and 5.0 Number of attempts: 1 Placement Confirmation: positive ETCO2 and breath sounds checked- equal and bilateral Comments: Small leak

## 2011-07-21 NOTE — Progress Notes (Signed)
[name], MANDALA NO.:  [id number]  MEDICAL RECORD NO.:  [id number]  LOCATION:  APPO                          FACILITY:  [facility]  PHYSICIAN:  Estill Bamberg. Karie Kirks, M.D.DATE OF BIRTH:  [birth year]  DATE OF PROCEDURE: DATE OF DISCHARGE:                                PROGRESS NOTE   SUBJECTIVE:  He is feeling somewhat better.  OBJECTIVE:  Temp is 98.8, pulse 87, respiratory rate 18, blood pressure 140/90.  He is somewhat recumbent in bed.  He is in no acute distress. Well-developed and morbidly obese.  His heart has a regular rhythm, rate of 110.  Lungs are clear throughout.  He is moving air.  Abdomen is soft.  He does have a 2 cm diameter umbilical hernia.  He still has extensive swelling and erythema of the superior aspect of the mons pubis, particularly on the left.  His white cell count down to 13,600, and his sodium is up to 131 with a chloride of 93.  ASSESSMENT: 1. Abdominal abscess. 2. Uncontrolled type 2 diabetes. 3. Morbid obesity. 4. Electrolyte abnormalities, improving.  PLAN:  He will have a night and I and D today done in the operating room.  Continue with sliding scale insulin.  I reviewed his wound culture, which showed no growth at present.     Estill Bamberg. Karie Kirks, M.D.     SDK/MEDQ  D:  07/21/2011  T:  07/21/2011  Job:  [id number]

## 2011-07-22 LAB — BASIC METABOLIC PANEL WITH GFR
BUN: 9 mg/dL (ref 6–23)
CO2: 28 meq/L (ref 19–32)
Calcium: 8.9 mg/dL (ref 8.4–10.5)
Chloride: 94 meq/L — ABNORMAL LOW (ref 96–112)
Creatinine, Ser: 0.83 mg/dL (ref 0.50–1.35)
GFR calc Af Amer: >90 mL/min
GFR calc non Af Amer: >90 mL/min
Glucose, Bld: 259 mg/dL — ABNORMAL HIGH (ref 70–99)
Potassium: 3.9 meq/L (ref 3.5–5.1)
Sodium: 131 meq/L — ABNORMAL LOW (ref 135–145)

## 2011-07-22 LAB — CBC
HCT: 35.6 % — ABNORMAL LOW (ref 39.0–52.0)
Hemoglobin: 11.9 g/dL — ABNORMAL LOW (ref 13.0–17.0)
MCH: 26.2 pg (ref 26.0–34.0)
MCHC: 33.4 g/dL (ref 30.0–36.0)
MCV: 78.2 fL (ref 78.0–100.0)
Platelets: 330 10*3/uL (ref 150–400)
RBC: 4.55 MIL/uL (ref 4.22–5.81)
RDW: 12.9 % (ref 11.5–15.5)
WBC: 8.9 10*3/uL (ref 4.0–10.5)

## 2011-07-22 LAB — GLUCOSE, CAPILLARY: Glucose-Capillary: 231 mg/dL — ABNORMAL HIGH (ref 70–99)

## 2011-07-22 LAB — WOUND CULTURE

## 2011-07-22 MED ORDER — SODIUM CHLORIDE 0.9 % IJ SOLN
INTRAMUSCULAR | Status: AC
  Administered 2011-07-22: 3 mL
  Filled 2011-07-22: qty 3

## 2011-07-22 NOTE — Progress Notes (Signed)
1 Day Post-Op  Subjective: Moderate pain at incision site.  Objective: Vital signs in last 24 hours: Temp:  [97.9 F (36.6 C)-99 F (37.2 C)] 98.2 F (36.8 C) (12/29 0609) Pulse Rate:  [80-100] 83  (12/29 0609) Resp:  [0-25] 20  (12/29 0609) BP: (115-152)/(68-92) 137/87 mmHg (12/29 0609) SpO2:  [90 %-100 %] 93 % (12/29 0609) Last BM Date: 07/20/11 (per patient)  Intake/Output from previous day: 12/28 0701 - 12/29 0700 In: 2415 [P.O.:1415; I.V.:900; IV Piggyback:100] Out: 10 [Blood:10] Intake/Output this shift:    Incision/Wound: packing removed from suprapubic wound. No purulent drainage noted. Some blood noted.  Lab Results:   [facility] 07/22/11 0635 07/21/11 0452  WBC 8.9 13.6*  HGB 11.9* 12.3*  HCT 35.6* 36.1*  PLT 330 323   BMET  Basename 07/22/11 0635 07/21/11 0452  NA 131* 131*  K 3.9 3.8  CL 94* 93*  CO2 28 27  GLUCOSE 259* 219*  BUN 9 9  CREATININE 0.83 0.83  CALCIUM 8.9 8.9   Cultures: No organisms seen on Gram stain. Final cultures pending.  Studies/Results: Dg Chest 2 View  07/20/2011  *RADIOLOGY REPORT*  Clinical Data: Preoperative respiratory evaluation.  CHEST - 2 VIEW 07/20/2011:  Comparison: Portable chest x-ray 01/05/2006 [facility].  Findings: Cardiac silhouette upper normal in size to perhaps slightly enlarged but stable.  Hilar and mediastinal contours otherwise unremarkable.  Lungs clear.  Bronchovascular markings normal.  Pulmonary vascularity normal.  No pleural effusions. Visualized bony thorax intact.  IMPRESSION: Borderline heart size.  No acute cardiopulmonary disease.  Original Report Authenticated By: Deniece Portela, M.D.    Anti-infectives: Anti-infectives     Start     Dose/Rate Route Frequency Ordered Stop   07/19/11 2200   doxycycline (VIBRA-TABS) tablet 100 mg        100 mg Oral Every 12 hours 07/19/11 1626     07/19/11 1730   metroNIDAZOLE (FLAGYL) IVPB 500 mg        500 mg 100 mL/hr over 60 Minutes  Intravenous Every 8 hours 07/19/11 1619     07/19/11 1700   cefTRIAXone (ROCEPHIN) 1 g in dextrose 5 % 50 mL IVPB        1 g 100 mL/hr over 30 Minutes Intravenous Every 24 hours 07/19/11 1619            Assessment/Plan: s/p Procedure(s): INCISION AND DRAINAGE ABSCESS Plan: Wound care orders have been prescribed. Patient will need home health for ongoing wound management. Will need tighter control of his blood glucoses.  LOS: 3 days    [name] A 07/22/2011

## 2011-07-22 NOTE — Anesthesia Postprocedure Evaluation (Signed)
Anesthesia Post Note  Patient: [name]  Procedure(s) Performed:  INCISION AND DRAINAGE ABSCESS - Suprapubic abscess  Anesthesia type: General  Patient location: 306  Post pain: Pain level controlled  Post assessment: Post-op Vital signs reviewed, Patient's Cardiovascular Status Stable, Respiratory Function Stable, Patent Airway, No signs of Nausea or vomiting and Pain level controlled  Last Vitals:  Filed Vitals:   07/22/11 1500  BP: 136/84  Pulse: 82  Temp: 36.7 C  Resp: 18    Post vital signs: Reviewed and stable  Level of consciousness: awake and alert   Complications: No apparent anesthesia complications

## 2011-07-22 NOTE — Addendum Note (Signed)
Addendum  created 07/22/11 1512 by Drucie Opitz, CRNA   Modules edited:Notes Section

## 2011-07-23 LAB — GLUCOSE, CAPILLARY
Glucose-Capillary: 259 mg/dL — ABNORMAL HIGH (ref 70–99)
Glucose-Capillary: 216 mg/dL — ABNORMAL HIGH (ref 70–99)
Glucose-Capillary: 182 mg/dL — ABNORMAL HIGH (ref 70–99)

## 2011-07-23 MED ORDER — SODIUM CHLORIDE 0.9 % IJ SOLN
INTRAMUSCULAR | Status: AC
  Filled 2011-07-23: qty 3

## 2011-07-23 NOTE — Progress Notes (Signed)
NAMELINVEL, SCHWENKER NO.:  [id number]  MEDICAL RECORD NO.:  [id number]  LOCATION:  A306                          FACILITY:  [facility]  PHYSICIAN:  Unk Lightning, MDDATE OF BIRTH:  05/16/1966  DATE OF PROCEDURE: DATE OF DISCHARGE:                                PROGRESS NOTE   The patient is postop day number two and a half for abdominal abscess, has obesity, type 2 diabetes, Currently given insulin, currently antibiotic regimen is Rocephin, doxycycline, as well as Flagyl intravenously.  The patient appears somewhat comfortable.  PHYSICAL EXAMINATION:  LUNGS:  Clear. HEART:  Unremarkable. ABDOMEN:  Tender in lower part near incision site.  White count dropped from 13.6 to 8.9, hemoglobin 11.9, potassium 3.9, glucose is averaging about 220. LUNGS:  Clear. HEART:  Unremarkable.  No S3, S4, gallop.  The chest x-ray is essentially clear.  Plan right now is continue triple antibiotic regimen, surgical oversight.  Monitor glucoses with a.c. and h.s. glucoses sliding scale. Continue metformin and Dr. Karie Kirks will follow up in the a.m.     Unk Lightning, MD     RMD/MEDQ  D:  07/23/2011  T:  07/23/2011  Job:  [id number]

## 2011-07-23 NOTE — Progress Notes (Signed)
2 Days Post-Op  Subjective: Less pain noted at incision site  Objective: Vital signs in last 24 hours: Temp:  [98 F (36.7 C)-98.4 F (36.9 C)] 98.3 F (36.8 C) (12/30 0532) Pulse Rate:  [82-94] 84  (12/30 0532) Resp:  [18] 18  (12/30 0532) BP: (126-145)/(84-87) 126/86 mmHg (12/30 0532) SpO2:  [90 %-96 %] 93 % (12/30 0532) Last BM Date: 07/20/11  Intake/Output from previous day: 12/29 0701 - 12/30 0700 In: 996 [P.O.:996] Out: -  Intake/Output this shift:    General appearance: alert, cooperative and no distress Incision/Wound: dressing intact. Less erythema noted.  Lab Results:   [facility] 07/22/11 0635 07/21/11 0452  WBC 8.9 13.6*  HGB 11.9* 12.3*  HCT 35.6* 36.1*  PLT 330 323   BMET  Basename 07/22/11 0635 07/21/11 0452  NA 131* 131*  K 3.9 3.8  CL 94* 93*  CO2 28 27  GLUCOSE 259* 219*  BUN 9 9  CREATININE 0.83 0.83  CALCIUM 8.9 8.9   PT/INR No results found for this basename: LABPROT:2,INR:2 in the last 72 hours  Studies/Results: No results found.  Anti-infectives: Anti-infectives     Start     Dose/Rate Route Frequency Ordered Stop   07/19/11 2200   doxycycline (VIBRA-TABS) tablet 100 mg        100 mg Oral Every 12 hours 07/19/11 1626     07/19/11 1730   metroNIDAZOLE (FLAGYL) IVPB 500 mg        500 mg 100 mL/hr over 60 Minutes Intravenous Every 8 hours 07/19/11 1619     07/19/11 1700   cefTRIAXone (ROCEPHIN) 1 g in dextrose 5 % 50 mL IVPB        1 g 100 mL/hr over 30 Minutes Intravenous Every 24 hours 07/19/11 1619            Assessment/Plan: s/p Procedure(s): INCISION AND DRAINAGE ABSCESS, resolving Plan: Continue wound care. Anticipate discharge in next 2448 hours. Blood glucose is still elevated.  LOS: 4 days    Jaszmine Navejas A 07/23/2011

## 2011-07-23 NOTE — Progress Notes (Signed)
[id number] 

## 2011-07-23 NOTE — Progress Notes (Signed)
NAMECHARLESON, MIKULICH NO.:  [id number]  MEDICAL RECORD NO.:  [id number]  LOCATION:  A306                          FACILITY:  [facility]  PHYSICIAN:  Estill Bamberg. Karie Kirks, M.D.DATE OF BIRTH:  1966-01-18  DATE OF PROCEDURE:  07/22/2011 DATE OF DISCHARGE:                                PROGRESS NOTE   SUBJECTIVE:  The patient feels about the same as yesterday, but now he is having pain in his suprapubic region where he had an I and D yesterday through Dr. Arnoldo Morale the general surgeon.  OBJECTIVE:  VITAL SIGNS:  Temperature 98.4. Pulse 89. Respiratory rate 18.  Blood pressure 145/87. LUNGS:  Clear throughout. HEART:  Regular rhythm, rate of about 80. GENITAL AREA:  He has a horizontal incision over the superior aspect of the mons pubis.  LABORATORY DATA:  White cell count today was 8900.  Sodium was 131 with a chloride of 94.  BUN was 9 with a creatinine of 0.83.  Sugars have been in the 2-3 hundred range  His wound culture showed a few gram- negative rods and rare WBCs.  The cultures will be re-incubated for better growth.  Anaerobic and routine cultures of his abscess from yesterday are pending, negative at this point.  ASSESSMENT: 1. Abdominal wall abscess. 2. Uncontrolled type 2 diabetes. 3. Morbid obesity. 4. Electrolyte abnormalities.  PLAN:  Continue on IV antibiotics.  Cultures are pending.  I am discontinuing his IV today, switching to a saline lock so he can get up and around.  I discussed his case with Dr. Arnoldo Morale, general surgeon, this morning.     Estill Bamberg. Karie Kirks, M.D.     SDK/MEDQ  D:  07/22/2011  T:  07/23/2011  Job:  [id number]

## 2011-07-24 LAB — CULTURE, ROUTINE-ABSCESS

## 2011-07-24 MED ORDER — METRONIDAZOLE 500 MG PO TABS: 500.0000 mg | ORAL_TABLET | Freq: Three times a day (TID) | ORAL | Status: AC

## 2011-07-24 MED ORDER — GLIMEPIRIDE 4 MG PO TABS: 4.0000 mg | ORAL_TABLET | Freq: Every day | ORAL | Status: DC

## 2011-07-24 MED ORDER — METFORMIN HCL 1000 MG PO TABS: 1000.0000 mg | ORAL_TABLET | Freq: Two times a day (BID) | ORAL | Status: DC

## 2011-07-24 MED ORDER — CEFUROXIME AXETIL 250 MG PO TABS: 500.0000 mg | ORAL_TABLET | Freq: Two times a day (BID) | ORAL | Status: AC

## 2011-07-24 NOTE — Discharge Summary (Signed)
NAMEBERTRAM, GIPE NO.:  [id number]  MEDICAL RECORD NO.:  [id number]  LOCATION:  A306                          FACILITY:  [facility]  PHYSICIAN:  Estill Bamberg. Karie Kirks, M.D.DATE OF BIRTH:  September 21, 1965  DATE OF ADMISSION:  07/19/2011 DATE OF DISCHARGE:  LH                              DISCHARGE SUMMARY   HISTORY:  This 45 year old was admitted to the hospital with an abdominal wall abscess and uncontrolled diabetes.  He had a benign 6 day hospitalization extending from July 19, 2011 to July 24, 2011. Vital signs were stable.  His admission white cell count was 16,400 of which 78% were neutrophils, 10 lymphs, sodium was 128 with a chloride 89, glucose 292, alk phos 147. White cell count dropped to 13,600, 8900.  Sodium remained stable at 131, chloride 93, recheck 94.  His glucoses were in the 200 range.  Routine cultures abscess showed no growth at 3 days.  Anaerobic cultures were also negative at 3 days.  Wound culture from admission from the drains at the left lateral lower abdomen showed moderate group B strep isolated.  Gram stain showed a few gram-negative rods.  This was a Streptococcus agalactiae.  DIAGNOSTICS: 1. Admission chest x-ray was essentially normal.  The cardiac     silhouette was felt to be upper normal size. 2. His 12-lead EKG was essentially normal.  TREATMENT:  Included ceftriaxone 1 gm IV q.24 h.; metronidazole 500 mg IV q.8 h.; Metformin 1000 mg b.i.d.; sliding-scale insulin; Lovenox 40 mg subcutaneous daily; doxycycline 100 mg q.12 h. and p.r.n. hydromorphone and ondansetron.  He is put on normal saline IV  initially ran at 100 mL an hour.   By his third day, Dr. Guido Sander did an incision and drainage on his abscess.  At that point, he felt he was dealing with a sterile abscess and cultures were negative as above.  Patient gradually recuperated from this and ready for discharge home his sixth hospital day.  FINAL DISCHARGE  DIAGNOSES: 1. Abdominal wall abscess. 2. Streptococcus agalactiae (group B Strep) infection. 3. Uncontrolled type 2 diabetes. 4. Morbid obesity. 5. Hypercholesterolemia.  DISPOSITION:  The patient discharged home today.  DISCHARGE MEDICATIONS: 1. Cefuroxime 250 mg two tablets b.i.d. for a 10-day course. 2. He will also be on metronidazole 500 mg t.i.d. for a 10-day course     (in case anaerobes were missed). 3. He is to continue on metformin 1000 mg b.i.d. (180 with three     refills). 4. Go back to glimepiride 4 mg q.a.m. (90 with three refills).  FOLLOW UP: 1. He will see me back in the office in 4 days for followup. 2. He will see the surgeon, Dr. Guido Sander in a week. 3. Visiting nurse will be checking his wound daily and irrigating it.     Estill Bamberg. Karie Kirks, M.D.     SDK/MEDQ  D:  07/24/2011  T:  07/24/2011  Job:  [id number]

## 2011-07-24 NOTE — Progress Notes (Signed)
CARE MANAGEMENT NOTE 07/24/2011  Patient:  [name], [name]   Account Number:  [id number]  Date Initiated:  07/21/2011  Documentation initiated by:  Claretha Cooper  Subjective/Objective Assessment:   Pt admitted with lower abd abscess, suprapubic. PTA, lived at home with spouse.     Action/Plan:   Dc home with no identified [place] needs   Anticipated DC Date:  07/22/2011   Anticipated DC Plan:  [place]  CM consult      Choice offered to / List presented to:          [facility] arranged  HH-1 RN      [place].   Status of service:  Completed, signed off Medicare Important Message given?   (If response is "NO", the following Medicare IM given date fields will be blank) Date Medicare IM given:   Date Additional Medicare IM given:    Discharge Disposition:  [place]  Per UR Regulation:    Comments:  07/21/11 [place] BSN

## 2011-07-24 NOTE — Progress Notes (Signed)
Dressing change to left groin area wnl,states understanding of discharge instructions,prescriptions given.

## 2011-07-24 NOTE — Progress Notes (Signed)
3 Days Post-Op  Subjective: Feels much better.  Objective: Vital signs in last 24 hours: Temp:  [98.3 F (36.8 C)-98.4 F (36.9 C)] 98.3 F (36.8 C) (12/31 0500) Pulse Rate:  [75-97] 77  (12/31 0500) Resp:  [18] 18  (12/31 0500) BP: (128-136)/(72-83) 136/72 mmHg (12/31 0500) SpO2:  [92 %-94 %] 94 % (12/31 0500) Last BM Date: 07/20/11  Intake/Output from previous day: 12/30 0701 - 12/31 0700 In: 840 [P.O.:840] Out: -  Intake/Output this shift:    Wound healing well.  Aerobic cultures are negative for growth.  Lab Results:   [facility] 07/22/11 0635  WBC 8.9  HGB 11.9*  HCT 35.6*  PLT 330   BMET  Basename 07/22/11 0635  NA 131*  K 3.9  CL 94*  CO2 28  GLUCOSE 259*  BUN 9  CREATININE 0.83  CALCIUM 8.9   PT/INR No results found for this basename: LABPROT:2,INR:2 in the last 72 hours  Studies/Results: No results found.  Anti-infectives: Anti-infectives     Start     Dose/Rate Route Frequency Ordered Stop   07/19/11 2200   doxycycline (VIBRA-TABS) tablet 100 mg        100 mg Oral Every 12 hours 07/19/11 1626     07/19/11 1730   metroNIDAZOLE (FLAGYL) IVPB 500 mg        500 mg 100 mL/hr over 60 Minutes Intravenous Every 8 hours 07/19/11 1619     07/19/11 1700   cefTRIAXone (ROCEPHIN) 1 g in dextrose 5 % 50 mL IVPB        1 g 100 mL/hr over 30 Minutes Intravenous Every 24 hours 07/19/11 1619            Assessment/Plan: s/p Procedure(s): INCISION AND DRAINAGE ABSCESS Agree with discharge. Home health to see the patient daily for wound care.  LOS: 5 days    Oaklen Thiam A 07/24/2011

## 2011-07-26 LAB — ANAEROBIC CULTURE: Gram Stain: NONE SEEN

## 2011-08-19 ENCOUNTER — Encounter (HOSPITAL_COMMUNITY): Payer: Self-pay

## 2011-08-19 ENCOUNTER — Emergency Department (HOSPITAL_COMMUNITY)
Admission: EM | Admit: 2011-08-19 | Discharge: 2011-08-19 | Disposition: A | Discharge status: Home / Self Care | Payer: Managed Care, Other (non HMO) | Attending: Emergency Medicine | Admitting: Emergency Medicine

## 2011-08-19 DIAGNOSIS — Z5189 Encounter for other specified aftercare: Secondary | ICD-10-CM | POA: Insufficient documentation

## 2011-08-19 DIAGNOSIS — E119 Type 2 diabetes mellitus without complications: Secondary | ICD-10-CM | POA: Insufficient documentation

## 2011-08-19 MED ORDER — HYDROMORPHONE HCL PF 2 MG/ML IJ SOLN
2.0000 mg | Freq: Once | INTRAMUSCULAR | Status: AC
  Administered 2011-08-19: 2 mg via INTRAMUSCULAR
  Filled 2011-08-19: qty 1

## 2011-08-19 NOTE — ED Provider Notes (Signed)
Scribed for Trisha Mangle, MD, the patient was seen in room APA03/APA03 . This chart was scribed by Glory Buff.   CSN: [id number]  Arrival date & time 08/19/11  2016   First MD Initiated Contact with Patient 08/19/11 2033      Chief Complaint  Patient presents with  . Wound Check    (Consider location/radiation/quality/duration/timing/severity/associated sxs/prior treatment) HPI Pt seen at 8:36 PM AJITH GABY is a 46 y.o. male who presents to the Emergency Department for wound check. Pt says he had an abscess drained on his lower abdomin 07/24/2011 by Dr. Arnoldo Morale. At a follow up with the surgeon on 08/01/2011, Pt c/o of "ball" around the wound and was told it was part of the healing process. Pt says he pulled gauze out 5 days ago from the wound that was not supposed to be left in the wound. Is concerned more guaze remains and that it is now infected. Pt  denies fever. Last antibiotic dosage 07/29/2011.    Past Medical History  Diagnosis Date  . Diabetes mellitus     Past Surgical History  Procedure Date  . Knee surgery     No family history on file.  History  Substance Use Topics  . Smoking status: Never Smoker   . Smokeless tobacco: Not on file  . Alcohol Use: No     Review of Systems  Constitutional: Negative for fever, activity change, appetite change and fatigue.  HENT: Negative for congestion, sore throat, rhinorrhea, neck pain and neck stiffness.   Respiratory: Negative for cough and wheezing.   Cardiovascular: Negative for chest pain and palpitations.  Gastrointestinal: Negative for nausea, vomiting and abdominal pain.  Genitourinary: Negative for dysuria, urgency, frequency and flank pain.  Skin: Negative for rash.  Neurological: Negative for dizziness, weakness, light-headedness, numbness and headaches.  All other systems reviewed and are negative.    Allergies  Review of patient's allergies indicates no known allergies.  Home Medications   Current  Outpatient Rx  Name Route Sig Dispense Refill  . GLIMEPIRIDE 4 MG PO TABS Oral Take 1 tablet (4 mg total) by mouth daily before breakfast. 90 tablet 3  . METFORMIN HCL 1000 MG PO TABS Oral Take 1,000 mg by mouth 2 (two) times daily with a meal.      . METFORMIN HCL 1000 MG PO TABS Oral Take 1 tablet (1,000 mg total) by mouth 2 (two) times daily with a meal. 180 tablet 3    BP 136/75  Pulse 108  Temp(Src) 98.2 F (36.8 C) (Oral)  Resp 20  Ht 5\' 11"  (1.803 m)  Wt 289 lb 6 oz (131.26 kg)  BMI 40.36 kg/m2  SpO2 96%  Physical Exam  Nursing note and vitals reviewed. Constitutional: He is oriented to person, place, and time. He appears well-developed and well-nourished. No distress.  HENT:  Head: Normocephalic and atraumatic.  Eyes: Conjunctivae and EOM are normal.  Neck: Normal range of motion. Neck supple.  Pulmonary/Chest: Effort normal. No respiratory distress.  Abdominal: Soft. There is tenderness.       Area of tenderness and induration associated with .5cm present in left groin.   Musculoskeletal: Normal range of motion.  Neurological: He is alert and oriented to person, place, and time.  Skin: Skin is warm and dry.  Psychiatric: He has a normal mood and affect. His behavior is normal.    ED Course  Procedures (including critical care time) DIAGNOSTIC STUDIES: Oxygen Saturation is 96% on room air, normal by  my interpretation.    COORDINATION OF CARE:  Labs Reviewed  GLUCOSE, CAPILLARY - Abnormal; Notable for the following:    Glucose-Capillary 371 (*)    All other components within normal limits  POCT CBG MONITORING   No results found.  8:41 PM EDP performed bedside US. No additional signs of abscess visualized.  8:47 PM Consult with Dr. Arnoldo Morale, surgeon. Dr. Arnoldo Morale agrees to see patient.   1. Visit for wound check      MDM  Patient was concerned that there was gauze still present within the wound. I consulted Dr. Arnoldo Morale who performed the initial incision and  drainage on this patient. He evaluated the patient and states he is safe for discharge to home without antibiotics or pain medication. He is instructed to followup in the clinic.  No concern for abscess or cellulitis at this time.  Dr Arnoldo Morale evaluated for the possibility of foreign body present  I personally performed the services described in this documentation, which was scribed in my presence. The recorded information has been reviewed and considered.         Trisha Mangle, MD 08/19/11 2130

## 2011-08-19 NOTE — ED Notes (Signed)
Pt presents with wound to lower abdomen. Pt states he had surgery on abscess and was released on 07/24/2011. Pt followed up with surgeon on 08/01/2011. Pt states he was released from surgeons care but noticed a "ball" around wound. Pt states he pulled gauze out of wound that was not supposed to be left in him. Wife states "infection" is present as well. Both insist gauze is still in wound.

## 2011-08-19 NOTE — Consult Note (Signed)
Patient is s/p incision and drainage of a suprapubic abscess 07/20/11.  Was cared for postoperatively by home health with packing dressing.  Patient and wife state they pulled out some gauze six days ago from the wound.  Is a Architectural technologist.  Presented this evening because of the concern of a retained piece of gauze.  Wound has healed over, with a small granuloma present just inferior to the wound.  U/S of the area did not show an abscess.  I explored the granuloma opening locally and did not find any foreign body.  No deep induration noted.  No purulent drainage present.  I told them that I could not find any gauze in the area of concern.  Should it not heal over the next few days, he should see me in the office.  He was fine with that.  Wife still convinced that gauze may be present.  I told them that if the wound does not heal, I could explore it in the OR under sedation as the original cavity was deep.  No need for antibiotic therapy.

## 2011-08-19 NOTE — ED Notes (Signed)
Pt alert & oriented x4, stable gait. Pt given discharge instructions, paperwork, pt verbalized understanding. Pt left department w/ no further questions.

## 2011-08-19 NOTE — ED Notes (Signed)
As per dr Arnoldo Morale, d/c home, follow up in office. edp notified.

## 2011-08-19 NOTE — ED Notes (Signed)
Dr Arnoldo Morale in w/ pt at this time.

## 2011-08-19 NOTE — ED Notes (Signed)
Wound to the left groin, pt states thinks gauze is still in place. edp used ultra sound & talked w/ Psychologist, sport and exercise.

## 2012-11-27 ENCOUNTER — Other Ambulatory Visit (HOSPITAL_COMMUNITY): Payer: Self-pay

## 2012-11-27 ENCOUNTER — Ambulatory Visit: Payer: Managed Care, Other (non HMO) | Attending: Family Medicine | Admitting: Sleep Medicine

## 2012-11-27 DIAGNOSIS — G4733 Obstructive sleep apnea (adult) (pediatric): Secondary | ICD-10-CM

## 2012-11-27 DIAGNOSIS — Z6841 Body Mass Index (BMI) 40.0 and over, adult: Secondary | ICD-10-CM | POA: Insufficient documentation

## 2012-12-02 NOTE — Procedures (Signed)
Estell Manor A. Merlene Laughter, MD     [email or web address]        NAME:  ARMARI, POINT               ACCOUNT NO.:  [id number]  MEDICAL RECORD NO.:  [id number]          PATIENT TYPE:  OUT  LOCATION:  SLEEP LAB                     FACILITY:  [facility]  PHYSICIAN:  Deara Bober A. Merlene Laughter, M.D. DATE OF BIRTH:  18-Oct-1965  DATE OF STUDY:  11/27/2012                           NOCTURNAL POLYSOMNOGRAM  REFERRING PHYSICIAN:  Estill Bamberg. Karie Kirks, M.D.  INDICATION:  This is a 47 year old man who presents with obesity and snoring.  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  MEDICATIONS:  Metformin.  EPWORTH SLEEPINESS SCALE:  3.  BMI:  44.  ARCHITECTURAL SUMMARY:  The total recording time is 383 minutes. Sleep efficiency 93%.  Sleep latency 13 minutes.  REM latency 68 minutes.  Stage N1 of 5%, N2 of 67%, N3 of 0%, and REM sleep 29%.  RESPIRATORY SUMMARY:  Baseline oxygen saturation is 96, lowest saturation 75 during REM sleep.  Diagnostic AHI is 20 with the events occurring almost exclusively during REM sleep.  The REM AHI is 58.  LIMB MOVEMENT SUMMARY:  PLM index 0.  ELECTROCARDIOGRAM SUMMARY:  Average heart rate is 73 with no significant dysrhythmias observed.  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  RECOMMENDATION:  Formal CPAP titration study.  Thanks for this referal.    Lissy Deuser A. Merlene Laughter, M.D.    KAD/MEDQ  D:  12/02/2012 09:21:50  T:  12/02/2012 09:56:16  Job:  [id number]

## 2012-12-06 ENCOUNTER — Encounter (HOSPITAL_COMMUNITY): Payer: Self-pay | Admitting: *Deleted

## 2012-12-06 ENCOUNTER — Emergency Department (HOSPITAL_COMMUNITY)
Admission: EM | Admit: 2012-12-06 | Discharge: 2012-12-06 | Disposition: A | Discharge status: Home / Self Care | Payer: Managed Care, Other (non HMO) | Attending: Emergency Medicine | Admitting: Emergency Medicine

## 2012-12-06 DIAGNOSIS — Y929 Unspecified place or not applicable: Secondary | ICD-10-CM | POA: Insufficient documentation

## 2012-12-06 DIAGNOSIS — S90861A Insect bite (nonvenomous), right foot, initial encounter: Secondary | ICD-10-CM

## 2012-12-06 DIAGNOSIS — E119 Type 2 diabetes mellitus without complications: Secondary | ICD-10-CM | POA: Insufficient documentation

## 2012-12-06 DIAGNOSIS — IMO0002 Reserved for concepts with insufficient information to code with codable children: Secondary | ICD-10-CM | POA: Insufficient documentation

## 2012-12-06 DIAGNOSIS — Y939 Activity, unspecified: Secondary | ICD-10-CM | POA: Insufficient documentation

## 2012-12-06 DIAGNOSIS — Z79899 Other long term (current) drug therapy: Secondary | ICD-10-CM | POA: Insufficient documentation

## 2012-12-06 MED ORDER — DOXYCYCLINE HYCLATE 100 MG PO CAPS: 100.0000 mg | ORAL_CAPSULE | Freq: Two times a day (BID) | ORAL | Status: DC

## 2012-12-06 NOTE — ED Notes (Signed)
Had tick removed lt medial ankle 2 weeks ago. Has red  Area to this area.  Painful

## 2012-12-06 NOTE — ED Provider Notes (Signed)
History    This chart was scribed for Rhunette Croft, MD by Shona Needles, ED Scribe. The patient was seen in room APA06/APA06. Patient's care was started at 2243.   CSN: [id number]  Arrival date & time 12/06/12  2243   First MD Initiated Contact with Patient 12/06/12 2301      No chief complaint on file.  The history is provided by the patient. No language interpreter was used.    HPI Comments:  HEWEY REDINGTON is a 47 y.o. male with h/o DM, who presents to the Emergency Department for insect bites to the right ankle. Pt now has 1 week of rash with redness to the inner right ankle after removing 5 small black "tick-like" insects from the area of complaint. Pain is 7/10, constant, and worse when walk. Despite cleaning the area with rubbing alcohol there has been no improvement. Pt denies headache, dysuria, nausea, vomiting, diarrhea, weakness, cough, SOB and any other pain. Pt denies allergies. Pt denies use of tobacco, alcohol, and illicit drug use. Pt's current address is [address] [place], [place].    Past Medical History  Diagnosis Date  . Diabetes mellitus     Past Surgical History  Procedure Laterality Date  . Knee surgery      History reviewed. No pertinent family history.  History  Substance Use Topics  . Smoking status: Never Smoker   . Smokeless tobacco: Not on file  . Alcohol Use: No      Review of Systems At least 10pt or greater review of systems completed and are negative except where specified in the HPI.  Allergies  Review of patient's allergies indicates no known allergies.  Home Medications   Current Outpatient Rx  Name  Route  Sig  Dispense  Refill  . EXPIRED: glimepiride (AMARYL) 4 MG tablet   Oral   Take 1 tablet (4 mg total) by mouth daily before breakfast.   90 tablet   3   . metFORMIN (GLUCOPHAGE) 1000 MG tablet   Oral   Take 1,000 mg by mouth 2 (two) times daily with a meal.           . EXPIRED: metFORMIN (GLUCOPHAGE) 1000 MG  tablet   Oral   Take 1 tablet (1,000 mg total) by mouth 2 (two) times daily with a meal.   180 tablet   3     BP 127/75  Pulse 99  Temp(Src) 100.3 F (37.9 C) (Oral)  Resp 18  Ht 5\' 11"  (1.803 m)  Wt 312 lb (141.522 kg)  BMI 43.53 kg/m2  SpO2 100%  Physical Exam  Nursing notes reviewed.  Electronic medical record reviewed. VITAL SIGNS:   Filed Vitals:   12/06/12 2255 12/06/12 2256  BP:  127/75  Pulse:  99  Temp:  100.3 F (37.9 C)  TempSrc:  Oral  Resp:  18  Height: 5\' 11"  (1.803 m)   Weight: 312 lb (141.522 kg)   SpO2:  100%   CONSTITUTIONAL: Awake, oriented, appears non-toxic HENT: Atraumatic, normocephalic, oral mucosa pink and moist, airway patent. Nares patent without drainage. External ears normal. EYES: Conjunctiva clear, EOMI, PERRLA NECK: Trachea midline, non-tender, supple CARDIOVASCULAR: Normal heart rate, Normal rhythm, No murmurs, rubs, gallops PULMONARY/CHEST: Clear to auscultation, no rhonchi, wheezes, or rales. Symmetrical breath sounds. Non-tender. ABDOMINAL: Non-distended, soft, non-tender - no rebound or guarding.  BS normal. NEUROLOGIC: Non-focal, moving all four extremities, no gross sensory or motor deficits. EXTREMITIES: No clubbing, cyanosis, or edema SKIN: Warm, Dry,  No erythema, annular erythematous rash on right ankle as shown    ED Course  Procedures (including critical care time)  Labs Reviewed - No data to display No results found.   1. Tick bite of foot, right, initial encounter       MDM  DANNI MEES is a 47 y.o. male presents with tick bite about a week ago now has a red area to the right medial aspect of his ankle. This is tender to palpation, it does not appear classic for erythema migrans, and Lyme disease not endemic to this area, however based on the patient's past history of difficulty with healing specifically with a lower abdominal abscess, we'll place the patient on doxycycline and have advised him to protect  the area using moleskin or similar materials to avoid chafing or rubbing the area off. Patient will followup with his primary care physician in about a week. Patient is nontoxic, does have a low-grade temp of 100.3 which also supports treating with antibiotics, otherwise his vital signs are stable and within normal limits     I personally performed the services described in this documentation, which was scribed in my presence. The recorded information has been reviewed and is accurate. Rhunette Croft, M.D.      Rhunette Croft, MD 12/07/12 [id number]

## 2012-12-06 NOTE — ED Notes (Signed)
Red area noted to inside of right ankle. Patient complaining of pain in that area.

## 2014-01-02 ENCOUNTER — Emergency Department (HOSPITAL_COMMUNITY)
Admission: EM | Admit: 2014-01-02 | Discharge: 2014-01-02 | Disposition: A | Discharge status: Home / Self Care | Payer: Managed Care, Other (non HMO) | Attending: Emergency Medicine | Admitting: Emergency Medicine

## 2014-01-02 ENCOUNTER — Encounter (HOSPITAL_COMMUNITY): Payer: Self-pay | Admitting: Emergency Medicine

## 2014-01-02 DIAGNOSIS — Z9889 Other specified postprocedural states: Secondary | ICD-10-CM | POA: Insufficient documentation

## 2014-01-02 DIAGNOSIS — M109 Gout, unspecified: Secondary | ICD-10-CM | POA: Insufficient documentation

## 2014-01-02 DIAGNOSIS — E119 Type 2 diabetes mellitus without complications: Secondary | ICD-10-CM | POA: Insufficient documentation

## 2014-01-02 DIAGNOSIS — Z79899 Other long term (current) drug therapy: Secondary | ICD-10-CM | POA: Insufficient documentation

## 2014-01-02 MED ORDER — COLCHICINE 0.6 MG PO TABS: 0.6000 mg | ORAL_TABLET | Freq: Two times a day (BID) | ORAL | Status: DC

## 2014-01-02 MED ORDER — HYDROCODONE-ACETAMINOPHEN 5-325 MG PO TABS: 2.0000 | ORAL_TABLET | ORAL | Status: DC | PRN

## 2014-01-02 NOTE — Discharge Instructions (Signed)

## 2014-01-02 NOTE — ED Provider Notes (Signed)
CSN: [id number]     Arrival date & time 01/02/14  1836 History   First MD Initiated Contact with Patient 01/02/14 1854     Chief Complaint  Patient presents with  . Foot Pain     (Consider location/radiation/quality/duration/timing/severity/associated sxs/prior Treatment) Patient is a 48 y.o. male presenting with lower extremity pain. The history is provided by the patient. No language interpreter was used.  Foot Pain This is a new problem. The current episode started today. The problem occurs constantly. The problem has been gradually worsening. Associated symptoms include joint swelling and myalgias. Nothing aggravates the symptoms. He has tried nothing for the symptoms. The treatment provided moderate relief.    Past Medical History  Diagnosis Date  . Diabetes mellitus    Past Surgical History  Procedure Laterality Date  . Knee surgery     History reviewed. No pertinent family history. History  Substance Use Topics  . Smoking status: Never Smoker   . Smokeless tobacco: Not on file  . Alcohol Use: No    Review of Systems  Musculoskeletal: Positive for joint swelling and myalgias.  All other systems reviewed and are negative.     Allergies  Review of patient's allergies indicates no known allergies.  Home Medications   Prior to Admission medications   Medication Sig Start Date End Date Taking? Authorizing Provider  glimepiride (AMARYL) 4 MG tablet Take 1 tablet (4 mg total) by mouth daily before breakfast. 07/24/11 01/02/14 Yes Robert Bellow, MD  ibuprofen (ADVIL,MOTRIN) 200 MG tablet Take 400 mg by mouth every 6 (six) hours as needed.   Yes Historical Provider, MD  metFORMIN (GLUCOPHAGE) 1000 MG tablet Take 1,000 mg by mouth 2 (two) times daily with a meal.     Yes Historical Provider, MD  colchicine 0.6 MG tablet Take 1 tablet (0.6 mg total) by mouth 2 (two) times daily. 01/02/14   Fransico Meadow, PA-C  HYDROcodone-acetaminophen (NORCO/VICODIN) 5-325 MG per  tablet Take 2 tablets by mouth every 4 (four) hours as needed for moderate pain. 01/02/14   Fransico Meadow, PA-C   BP 138/91  Pulse 107  Temp(Src) 98.1 F (36.7 C) (Oral)  Resp 16  Ht 5\' 11"  (1.803 m)  Wt 287 lb (130.182 kg)  BMI 40.05 kg/m2  SpO2 95% Physical Exam  Nursing note and vitals reviewed. Constitutional: He is oriented to person, place, and time. He appears well-developed and well-nourished.  HENT:  Head: Normocephalic and atraumatic.  Eyes: EOM are normal. Pupils are equal, round, and reactive to light.  Cardiovascular: Normal rate.   Pulmonary/Chest: Effort normal.  Abdominal: He exhibits no distension.  Musculoskeletal: He exhibits tenderness.  Swollen right 1st toe,  Decreased range of motion,  nv and ns intact  Neurological: He is alert and oriented to person, place, and time.  Skin: There is erythema.  Psychiatric: He has a normal mood and affect.    ED Course  Procedures (including critical care time) Labs Review Labs Reviewed - No data to display  Imaging Review No results found.   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

## 2014-01-02 NOTE — ED Provider Notes (Signed)
Medical screening examination/treatment/procedure(s) were performed by non-physician practitioner and as supervising physician I was immediately available for consultation/collaboration.   EKG Interpretation None        Orpah Greek, MD 01/02/14 1907

## 2014-01-02 NOTE — ED Notes (Signed)
Pain rt foot, and rt foot.  No known injury

## 2014-01-02 NOTE — ED Notes (Signed)
Patient with continued pain, Rx for pain medication given.Marland Kitchen Respirations even and unlabored. Skin warm/dry. Discharge instructions reviewed with patient at this time. Patient given opportunity to voice concerns/ask questions. Patient discharged at this time and left Emergency Department with steady gait.

## 2020-03-02 ENCOUNTER — Encounter (HOSPITAL_COMMUNITY): Payer: Self-pay | Admitting: Emergency Medicine

## 2020-03-02 ENCOUNTER — Other Ambulatory Visit: Payer: Self-pay

## 2020-03-02 DIAGNOSIS — J189 Pneumonia, unspecified organism: Secondary | ICD-10-CM | POA: Diagnosis not present

## 2020-03-02 DIAGNOSIS — Z20822 Contact with and (suspected) exposure to covid-19: Secondary | ICD-10-CM | POA: Insufficient documentation

## 2020-03-02 DIAGNOSIS — Z7984 Long term (current) use of oral hypoglycemic drugs: Secondary | ICD-10-CM | POA: Insufficient documentation

## 2020-03-02 DIAGNOSIS — Z79899 Other long term (current) drug therapy: Secondary | ICD-10-CM | POA: Diagnosis not present

## 2020-03-02 DIAGNOSIS — R0981 Nasal congestion: Secondary | ICD-10-CM | POA: Diagnosis present

## 2020-03-02 DIAGNOSIS — I10 Essential (primary) hypertension: Secondary | ICD-10-CM | POA: Insufficient documentation

## 2020-03-02 DIAGNOSIS — E119 Type 2 diabetes mellitus without complications: Secondary | ICD-10-CM | POA: Insufficient documentation

## 2020-03-02 LAB — CBG MONITORING, ED: Glucose-Capillary: 245 mg/dL — ABNORMAL HIGH (ref 70–99)

## 2020-03-02 NOTE — ED Triage Notes (Addendum)
Pt c/o shortness of breath with cold symptoms. Denies known covid exposure. NAD noted

## 2020-03-03 ENCOUNTER — Emergency Department (HOSPITAL_COMMUNITY)
Admission: EM | Admit: 2020-03-03 | Discharge: 2020-03-03 | Disposition: A | Discharge status: Home / Self Care | Payer: Commercial Managed Care - PPO | Attending: Emergency Medicine | Admitting: Emergency Medicine

## 2020-03-03 ENCOUNTER — Emergency Department (HOSPITAL_COMMUNITY): Payer: Commercial Managed Care - PPO

## 2020-03-03 DIAGNOSIS — J189 Pneumonia, unspecified organism: Secondary | ICD-10-CM

## 2020-03-03 DIAGNOSIS — R0981 Nasal congestion: Secondary | ICD-10-CM

## 2020-03-03 DIAGNOSIS — I10 Essential (primary) hypertension: Secondary | ICD-10-CM

## 2020-03-03 HISTORY — DX: Essential (primary) hypertension: I10

## 2020-03-03 LAB — SARS CORONAVIRUS 2 BY RT PCR (HOSPITAL ORDER, PERFORMED IN ~~LOC~~ HOSPITAL LAB): SARS Coronavirus 2: NEGATIVE

## 2020-03-03 LAB — SARS CORONAVIRUS 2 (TAT 6-24 HRS): SARS Coronavirus 2: NEGATIVE

## 2020-03-03 MED ORDER — AZITHROMYCIN 250 MG PO TABS
250.0000 mg | ORAL_TABLET | Freq: Every day | ORAL | 0 refills | Status: DC
Start: 2020-03-03 — End: 2020-04-10

## 2020-03-03 MED ORDER — AZITHROMYCIN 250 MG PO TABS
500.0000 mg | ORAL_TABLET | Freq: Once | ORAL | Status: AC
  Administered 2020-03-03: 500 mg via ORAL
  Filled 2020-03-03: qty 2

## 2020-03-03 MED ORDER — CEFTRIAXONE SODIUM 1 G IJ SOLR
1.0000 g | Freq: Once | INTRAMUSCULAR | Status: AC
  Administered 2020-03-03: 1 g via INTRAMUSCULAR
  Filled 2020-03-03: qty 10

## 2020-03-03 MED ORDER — STERILE WATER FOR INJECTION IJ SOLN
INTRAMUSCULAR | Status: AC
  Administered 2020-03-03: 10 mL
  Filled 2020-03-03: qty 10

## 2020-03-03 MED ORDER — CEFDINIR 300 MG PO CAPS
300.0000 mg | ORAL_CAPSULE | Freq: Two times a day (BID) | ORAL | 0 refills | Status: DC
Start: 2020-03-03 — End: 2020-04-10

## 2020-03-03 MED ORDER — AMLODIPINE BESYLATE 5 MG PO TABS
5.0000 mg | ORAL_TABLET | Freq: Every day | ORAL | 0 refills | Status: DC
Start: 2020-03-03 — End: 2020-04-10

## 2020-03-03 NOTE — ED Provider Notes (Signed)
[facility] EMERGENCY DEPARTMENT Provider Note   CSN: [id number] Arrival date & time: 03/02/20  2137     History Chief Complaint  Patient presents with  . Nasal Congestion    [name] is a 54 y.o. male.  Patient presents to the emergency department for evaluation of URI symptoms. Patient reports he has had nasal congestion for a couple of days. Today started having a cough and brought up a bunch of phlegm. He was feeling short of breath earlier but since he coughed up of the phlegm his breathing has significantly improved. He has not noticed any fevers. He has not had Covid vaccination.        Past Medical History:  Diagnosis Date  . Diabetes mellitus   . Hypertension     There are no problems to display for this patient.   Past Surgical History:  Procedure Laterality Date  . INCISION AND DRAINAGE    . KNEE SURGERY         History reviewed. No pertinent family history.  Social History   Tobacco Use  . Smoking status: Never Smoker  . Smokeless tobacco: Never Used  Substance Use Topics  . Alcohol use: No  . Drug use: No    Home Medications Prior to Admission medications   Medication Sig Start Date End Date Taking? Authorizing Provider  amLODipine (NORVASC) 5 MG tablet Take 1 tablet (5 mg total) by mouth daily. 03/03/20   Orpah Greek, MD  azithromycin (ZITHROMAX Z-PAK) 250 MG tablet Take 1 tablet (250 mg total) by mouth daily. 03/03/20   Orpah Greek, MD  cefdinir (OMNICEF) 300 MG capsule Take 1 capsule (300 mg total) by mouth 2 (two) times daily. 03/03/20   Orpah Greek, MD  colchicine 0.6 MG tablet Take 1 tablet (0.6 mg total) by mouth 2 (two) times daily. 01/02/14   Fransico Meadow, PA-C  glimepiride (AMARYL) 4 MG tablet Take 1 tablet (4 mg total) by mouth daily before breakfast. 07/24/11 01/02/14  [name], MD  HYDROcodone-acetaminophen (NORCO/VICODIN) 5-325 MG per tablet Take 2 tablets by mouth every 4 (four) hours  as needed for moderate pain. 01/02/14   Fransico Meadow, PA-C  ibuprofen (ADVIL,MOTRIN) 200 MG tablet Take 400 mg by mouth every 6 (six) hours as needed.    [provider]  metFORMIN (GLUCOPHAGE) 1000 MG tablet Take 1,000 mg by mouth 2 (two) times daily with a meal.      [provider]    Allergies    Patient has no known allergies.  Review of Systems   Review of Systems  Physical Exam Updated Vital Signs BP (!) 188/106   Pulse 84   Temp 98.4 F (36.9 C) (Oral)   Resp 16   Ht 5\' 11"  (1.803 m)   Wt 123.5 kg   SpO2 99%   BMI 37.98 kg/m   Physical Exam  ED Results / Procedures / Treatments   Labs (all labs ordered are listed, but only abnormal results are displayed) Labs Reviewed  CBG MONITORING, ED - Abnormal; Notable for the following components:      Result Value   Glucose-Capillary 245 (*)    All other components within normal limits  SARS CORONAVIRUS 2 BY RT PCR (HOSPITAL ORDER, [facility])  SARS CORONAVIRUS 2 (TAT 6-24 HRS)    EKG EKG Interpretation  Date/Time:  Tuesday March 02 2020 22:05:05 EDT Ventricular Rate:  94 PR Interval:  176 QRS Duration:  88 QT Interval:  356 QTC Calculation: 445 R Axis:   113 Text Interpretation: Normal sinus rhythm Left posterior fascicular block Nonspecific T wave abnormality Abnormal ECG No significant change since last tracing Confirmed by Orpah Greek [phone]) on 03/03/2020 2:50:25 AM   Radiology DG Chest Port 1 View  Result Date: 03/03/2020 CLINICAL DATA:  Shortness of breath.  Cold symptoms. EXAM: PORTABLE CHEST 1 VIEW COMPARISON:  Two-view chest x-ray 07/20/2011 FINDINGS: Heart size is exaggerate by low lung volumes. Bibasilar airspace opacities are present, left greater than right. Pleural effusions are present. Diffuse interstitial coarsening is present bilaterally. IMPRESSION: 1. Bibasilar airspace disease, left greater than right. This is concerning for bilateral  pneumonia. 2. Small bilateral pleural effusions. Electronically Signed   By: San Morelle M.D.   On: 03/03/2020 04:06    Procedures Procedures (including critical care time)  Medications Ordered in ED Medications  cefTRIAXone (ROCEPHIN) injection 1 g (has no administration in time range)  azithromycin (ZITHROMAX) tablet 500 mg (has no administration in time range)    ED Course  I have reviewed the triage vital signs and the nursing notes.  Pertinent labs & imaging results that were available during my care of the patient were reviewed by me and considered in my medical decision making (see chart for details).    MDM Rules/Calculators/A&P                          Patient appears comfortable currently. His oxygen saturations are 98% on room air. He was reporting shortness of breath earlier that cleared after he coughed up a bunch of phlegm. He has had URI symptoms for several days and has not been vaccinated. This was concerning for possible Covid infection. Patient with bilateral bibasilar airspace disease on his x-ray which was also concerning for possible Covid pneumonia. His PCR Covid test, however, was negative. We'll send repeat test to outpatient lab. As patient is in no distress he does not require hospitalization, will treat for community-acquired pneumonia.  Blood pressure noted to be elevated here today. He is asymptomatic. Will give prescription for Norvasc. Patient is to follow-up with primary care in the next week for recheck of his blood pressure and to ensure clearing of his pneumonia. Return to the ER for worsening symptoms.  Final Clinical Impression(s) / ED Diagnoses Final diagnoses:  Community acquired pneumonia, unspecified laterality  Essential hypertension    Rx / DC Orders ED Discharge Orders         Ordered    amLODipine (NORVASC) 5 MG tablet  Daily     Discontinue  Reprint     03/03/20 0539    cefdinir (OMNICEF) 300 MG capsule  2 times daily      Discontinue  Reprint     03/03/20 0539    azithromycin (ZITHROMAX Z-PAK) 250 MG tablet  Daily     Discontinue  Reprint     03/03/20 0539           Orpah Greek, MD 03/03/20 [phone]

## 2020-03-03 NOTE — Discharge Instructions (Signed)
Schedule follow-up with your doctor in 1 week for a recheck of your blood pressure and to ensure that your lungs are improving. If you have increasing difficulty breathing return to the ER.

## 2020-04-05 ENCOUNTER — Ambulatory Visit
Admission: EM | Admit: 2020-04-05 | Discharge: 2020-04-05 | Disposition: A | Discharge status: Home / Self Care | Payer: 59

## 2020-04-05 ENCOUNTER — Encounter (HOSPITAL_COMMUNITY): Payer: Self-pay

## 2020-04-05 ENCOUNTER — Emergency Department (HOSPITAL_COMMUNITY): Payer: Commercial Managed Care - PPO

## 2020-04-05 ENCOUNTER — Inpatient Hospital Stay (HOSPITAL_COMMUNITY)
Admission: EM | Admit: 2020-04-05 | Discharge: 2020-04-10 | DRG: 291 | Disposition: A | Discharge status: Home / Self Care | Payer: Commercial Managed Care - PPO | Attending: Internal Medicine | Admitting: Internal Medicine

## 2020-04-05 ENCOUNTER — Other Ambulatory Visit: Payer: Self-pay

## 2020-04-05 DIAGNOSIS — E1121 Type 2 diabetes mellitus with diabetic nephropathy: Secondary | ICD-10-CM

## 2020-04-05 DIAGNOSIS — N182 Chronic kidney disease, stage 2 (mild): Secondary | ICD-10-CM | POA: Diagnosis not present

## 2020-04-05 DIAGNOSIS — E1165 Type 2 diabetes mellitus with hyperglycemia: Secondary | ICD-10-CM | POA: Diagnosis not present

## 2020-04-05 DIAGNOSIS — N179 Acute kidney failure, unspecified: Secondary | ICD-10-CM | POA: Diagnosis present

## 2020-04-05 DIAGNOSIS — G4733 Obstructive sleep apnea (adult) (pediatric): Secondary | ICD-10-CM | POA: Diagnosis present

## 2020-04-05 DIAGNOSIS — E1122 Type 2 diabetes mellitus with diabetic chronic kidney disease: Secondary | ICD-10-CM | POA: Diagnosis present

## 2020-04-05 DIAGNOSIS — E669 Obesity, unspecified: Secondary | ICD-10-CM

## 2020-04-05 DIAGNOSIS — I5033 Acute on chronic diastolic (congestive) heart failure: Secondary | ICD-10-CM | POA: Diagnosis present

## 2020-04-05 DIAGNOSIS — Z7984 Long term (current) use of oral hypoglycemic drugs: Secondary | ICD-10-CM | POA: Diagnosis not present

## 2020-04-05 DIAGNOSIS — R600 Localized edema: Secondary | ICD-10-CM | POA: Diagnosis not present

## 2020-04-05 DIAGNOSIS — Z833 Family history of diabetes mellitus: Secondary | ICD-10-CM

## 2020-04-05 DIAGNOSIS — Z9111 Patient's noncompliance with dietary regimen: Secondary | ICD-10-CM | POA: Diagnosis not present

## 2020-04-05 DIAGNOSIS — Z8701 Personal history of pneumonia (recurrent): Secondary | ICD-10-CM | POA: Diagnosis not present

## 2020-04-05 DIAGNOSIS — I509 Heart failure, unspecified: Secondary | ICD-10-CM | POA: Diagnosis not present

## 2020-04-05 DIAGNOSIS — I5031 Acute diastolic (congestive) heart failure: Secondary | ICD-10-CM | POA: Diagnosis not present

## 2020-04-05 DIAGNOSIS — Z20822 Contact with and (suspected) exposure to covid-19: Secondary | ICD-10-CM | POA: Diagnosis present

## 2020-04-05 DIAGNOSIS — Z79899 Other long term (current) drug therapy: Secondary | ICD-10-CM

## 2020-04-05 DIAGNOSIS — Z6838 Body mass index (BMI) 38.0-38.9, adult: Secondary | ICD-10-CM

## 2020-04-05 DIAGNOSIS — N1831 Chronic kidney disease, stage 3a: Secondary | ICD-10-CM | POA: Diagnosis not present

## 2020-04-05 DIAGNOSIS — I130 Hypertensive heart and chronic kidney disease with heart failure and stage 1 through stage 4 chronic kidney disease, or unspecified chronic kidney disease: Secondary | ICD-10-CM | POA: Diagnosis not present

## 2020-04-05 DIAGNOSIS — I10 Essential (primary) hypertension: Secondary | ICD-10-CM

## 2020-04-05 DIAGNOSIS — I160 Hypertensive urgency: Secondary | ICD-10-CM | POA: Diagnosis not present

## 2020-04-05 DIAGNOSIS — E785 Hyperlipidemia, unspecified: Secondary | ICD-10-CM | POA: Diagnosis present

## 2020-04-05 DIAGNOSIS — E6609 Other obesity due to excess calories: Secondary | ICD-10-CM | POA: Diagnosis not present

## 2020-04-05 LAB — CBC WITH DIFFERENTIAL/PLATELET
Abs Immature Granulocytes: 0.02 10*3/uL (ref 0.00–0.07)
Basophils Absolute: 0 10*3/uL (ref 0.0–0.1)
Basophils Relative: 0 %
Eosinophils Absolute: 0.2 10*3/uL (ref 0.0–0.5)
Eosinophils Relative: 3 %
HCT: 33.4 % — ABNORMAL LOW (ref 39.0–52.0)
Hemoglobin: 10.8 g/dL — ABNORMAL LOW (ref 13.0–17.0)
Immature Granulocytes: 0 %
Lymphocytes Relative: 28 %
Lymphs Abs: 1.4 10*3/uL (ref 0.7–4.0)
MCH: 27.1 pg (ref 26.0–34.0)
MCHC: 32.3 g/dL (ref 30.0–36.0)
MCV: 83.9 fL (ref 80.0–100.0)
Monocytes Absolute: 0.6 10*3/uL (ref 0.1–1.0)
Monocytes Relative: 11 %
Neutro Abs: 2.9 10*3/uL (ref 1.7–7.7)
Neutrophils Relative %: 58 %
Platelets: 242 10*3/uL (ref 150–400)
RBC: 3.98 MIL/uL — ABNORMAL LOW (ref 4.22–5.81)
RDW: 14 % (ref 11.5–15.5)
WBC: 5.1 10*3/uL (ref 4.0–10.5)
nRBC: 0 % (ref 0.0–0.2)

## 2020-04-05 LAB — COMPREHENSIVE METABOLIC PANEL
ALT: 35 U/L (ref 0–44)
AST: 32 U/L (ref 15–41)
Albumin: 3 g/dL — ABNORMAL LOW (ref 3.5–5.0)
Alkaline Phosphatase: 94 U/L (ref 38–126)
Anion gap: 8 (ref 5–15)
BUN: 33 mg/dL — ABNORMAL HIGH (ref 6–20)
CO2: 26 mmol/L (ref 22–32)
Calcium: 8.1 mg/dL — ABNORMAL LOW (ref 8.9–10.3)
Chloride: 106 mmol/L (ref 98–111)
Creatinine, Ser: 1.67 mg/dL — ABNORMAL HIGH (ref 0.61–1.24)
GFR calc Af Amer: 53 mL/min — ABNORMAL LOW (ref >60)
GFR calc non Af Amer: 45 mL/min — ABNORMAL LOW (ref >60)
Glucose, Bld: 192 mg/dL — ABNORMAL HIGH (ref 70–99)
Potassium: 3.5 mmol/L (ref 3.5–5.1)
Sodium: 140 mmol/L (ref 135–145)
Total Bilirubin: 0.4 mg/dL (ref 0.3–1.2)
Total Protein: 6 g/dL — ABNORMAL LOW (ref 6.5–8.1)

## 2020-04-05 LAB — TROPONIN I (HIGH SENSITIVITY)
Troponin I (High Sensitivity): 14 ng/L (ref <18)
Troponin I (High Sensitivity): 14 ng/L (ref <18)

## 2020-04-05 LAB — SARS CORONAVIRUS 2 BY RT PCR (HOSPITAL ORDER, PERFORMED IN ~~LOC~~ HOSPITAL LAB): SARS Coronavirus 2: NEGATIVE

## 2020-04-05 LAB — BRAIN NATRIURETIC PEPTIDE: B Natriuretic Peptide: 311 pg/mL — ABNORMAL HIGH (ref 0.0–100.0)

## 2020-04-05 MED ORDER — HYDRALAZINE HCL 25 MG PO TABS
25.0000 mg | ORAL_TABLET | Freq: Four times a day (QID) | ORAL | Status: DC | PRN
  Administered 2020-04-05 – 2020-04-09 (×6): 25 mg via ORAL
  Filled 2020-04-05 (×5): qty 1

## 2020-04-05 MED ORDER — FUROSEMIDE 10 MG/ML IJ SOLN
40.0000 mg | Freq: Two times a day (BID) | INTRAMUSCULAR | Status: DC
  Administered 2020-04-06: 40 mg via INTRAVENOUS
  Filled 2020-04-05: qty 4

## 2020-04-05 MED ORDER — CARVEDILOL 3.125 MG PO TABS
6.2500 mg | ORAL_TABLET | Freq: Two times a day (BID) | ORAL | Status: DC
  Administered 2020-04-05: 6.25 mg via ORAL
  Filled 2020-04-05: qty 2

## 2020-04-05 MED ORDER — ONDANSETRON HCL 4 MG PO TABS: 4.0000 mg | ORAL_TABLET | Freq: Four times a day (QID) | ORAL | Status: DC | PRN

## 2020-04-05 MED ORDER — AMLODIPINE BESYLATE 5 MG PO TABS
5.0000 mg | ORAL_TABLET | Freq: Every day | ORAL | Status: DC
  Administered 2020-04-05: 5 mg via ORAL
  Filled 2020-04-05: qty 1

## 2020-04-05 MED ORDER — PRAVASTATIN SODIUM 40 MG PO TABS
40.0000 mg | ORAL_TABLET | Freq: Every day | ORAL | Status: DC
  Administered 2020-04-05 – 2020-04-10 (×6): 40 mg via ORAL
  Filled 2020-04-05 (×8): qty 1

## 2020-04-05 MED ORDER — SODIUM CHLORIDE 0.9% FLUSH: 3.0000 mL | INTRAVENOUS | Status: DC | PRN

## 2020-04-05 MED ORDER — ENOXAPARIN SODIUM 40 MG/0.4ML ~~LOC~~ SOLN
40.0000 mg | Freq: Two times a day (BID) | SUBCUTANEOUS | Status: DC
  Administered 2020-04-05 – 2020-04-10 (×10): 40 mg via SUBCUTANEOUS
  Filled 2020-04-05 (×10): qty 0.4

## 2020-04-05 MED ORDER — SODIUM CHLORIDE 0.9 % IV SOLN: 250.0000 mL | INTRAVENOUS | Status: DC | PRN

## 2020-04-05 MED ORDER — SODIUM CHLORIDE 0.9% FLUSH
3.0000 mL | Freq: Two times a day (BID) | INTRAVENOUS | Status: DC
  Administered 2020-04-05 – 2020-04-10 (×10): 3 mL via INTRAVENOUS

## 2020-04-05 MED ORDER — LABETALOL HCL 5 MG/ML IV SOLN
20.0000 mg | Freq: Once | INTRAVENOUS | Status: AC
  Administered 2020-04-05: 20 mg via INTRAVENOUS
  Filled 2020-04-05: qty 4

## 2020-04-05 MED ORDER — ONDANSETRON HCL 4 MG/2ML IJ SOLN: 4.0000 mg | Freq: Four times a day (QID) | INTRAMUSCULAR | Status: DC | PRN

## 2020-04-05 MED ORDER — LISINOPRIL 10 MG PO TABS
10.0000 mg | ORAL_TABLET | Freq: Every day | ORAL | Status: DC
  Filled 2020-04-05: qty 1

## 2020-04-05 MED ORDER — FUROSEMIDE 10 MG/ML IJ SOLN
40.0000 mg | Freq: Once | INTRAMUSCULAR | Status: AC
  Administered 2020-04-05: 40 mg via INTRAVENOUS
  Filled 2020-04-05: qty 4

## 2020-04-05 MED ORDER — INSULIN ASPART 100 UNIT/ML ~~LOC~~ SOLN
0.0000 [IU] | Freq: Three times a day (TID) | SUBCUTANEOUS | Status: DC
  Administered 2020-04-06: 2 [IU] via SUBCUTANEOUS
  Administered 2020-04-06: 1 [IU] via SUBCUTANEOUS
  Administered 2020-04-06 – 2020-04-08 (×4): 2 [IU] via SUBCUTANEOUS
  Administered 2020-04-08: 3 [IU] via SUBCUTANEOUS
  Administered 2020-04-08: 2 [IU] via SUBCUTANEOUS
  Administered 2020-04-09: 1 [IU] via SUBCUTANEOUS
  Administered 2020-04-09 (×2): 3 [IU] via SUBCUTANEOUS
  Administered 2020-04-10: 1 [IU] via SUBCUTANEOUS
  Administered 2020-04-10: 3 [IU] via SUBCUTANEOUS
  Filled 2020-04-05 (×2): qty 1

## 2020-04-05 MED ORDER — LISINOPRIL 10 MG PO TABS
10.0000 mg | ORAL_TABLET | Freq: Once | ORAL | Status: AC
  Administered 2020-04-05: 10 mg via ORAL
  Filled 2020-04-05: qty 1

## 2020-04-05 NOTE — H&P (Signed)
TRH H&P    Patient Demographics:    [name], is a 54 y.o. male  MRN: [id number]  DOB - 07/22/1966  Admit Date - 04/05/2020  Referring MD/NP/PA: Evalee Jefferson  Outpatient Primary MD for the patient is [name], MD  Patient coming from: Home  Chief complaint-leg swelling   HPI:    [name]  is a 54 y.o. male, with history of hypertension, diabetes mellitus type 2, presented to ED with complaints of 1 month history of bilateral lower extremity swelling and shortness of breath on exertion. Patient states that he has hypertension and has been compliant with taking his medications. He was seen in the ED on 03/03/2020 and was prescribed cefdinir for pneumonia. Patient said that he has noted increasing swelling of lower extremities and has gained weight. He denies chest pain. Denies nausea vomiting or diarrhea. Complains of mild dysuria. No previous history of stroke or seizures. No history of cancer In the ED, patient was found to be in hypertensive urgency, BNP elevated at 311. Creatinine was elevated 1.67, last creatinine from 2012 was 0.83. Patient was given Lasix 40 mg IV x1.   Review of systems:    In addition to the HPI above,    All other systems reviewed and are negative.    Past History of the following :    Past Medical History:  Diagnosis Date  . Diabetes mellitus   . Hypertension       Past Surgical History:  Procedure Laterality Date  . INCISION AND DRAINAGE    . KNEE SURGERY        Social History:      Social History   Tobacco Use  . Smoking status: Never Smoker  . Smokeless tobacco: Never Used  Substance Use Topics  . Alcohol use: No       Family History :   Patient's father had cancer but patient not sure which cancer he had.   Home Medications:   Prior to Admission medications   Medication Sig Start Date End Date Taking? Authorizing Provider   aspirin 325 MG tablet Take 325 mg by mouth every 6 (six) hours as needed for mild pain.   Yes [provider]  cefdinir (OMNICEF) 300 MG capsule Take 1 capsule (300 mg total) by mouth 2 (two) times daily. 03/03/20  Yes Pollina, Gwenyth Allegra, MD  glimepiride (AMARYL) 4 MG tablet Take 1 tablet (4 mg total) by mouth daily before breakfast. Patient taking differently: Take 4 mg by mouth in the morning and at bedtime.  07/24/11 04/05/20 Yes [name], MD  ibuprofen (ADVIL,MOTRIN) 200 MG tablet Take 400 mg by mouth every 6 (six) hours as needed for mild pain.    Yes [provider]  lisinopril (ZESTRIL) 10 MG tablet Take 10 mg by mouth daily.   Yes [provider]  metFORMIN (GLUCOPHAGE-XR) 500 MG 24 hr tablet Take 500 mg by mouth in the morning and at bedtime.   Yes [provider]  Multiple Vitamin (MULTIVITAMIN WITH MINERALS) TABS tablet Take 1  tablet by mouth daily.   Yes [provider]  pravastatin (PRAVACHOL) 40 MG tablet Take 40 mg by mouth daily.   Yes [provider]  amLODipine (NORVASC) 5 MG tablet Take 1 tablet (5 mg total) by mouth daily. Patient not taking: Reported on 04/05/2020 03/03/20   Orpah Greek, MD  azithromycin (ZITHROMAX Z-PAK) 250 MG tablet Take 1 tablet (250 mg total) by mouth daily. Patient not taking: Reported on 04/05/2020 03/03/20   Orpah Greek, MD  colchicine 0.6 MG tablet Take 1 tablet (0.6 mg total) by mouth 2 (two) times daily. Patient not taking: Reported on 04/05/2020 01/02/14   Fransico Meadow, PA-C     Allergies:    No Known Allergies   Physical Exam:   Vitals  Blood pressure (!) 188/107, pulse 81, temperature 98.1 F (36.7 C), temperature source Oral, resp. rate 18, height 5\' 11"  (1.803 m), weight 122.5 kg, SpO2 94 %.  1.  General: Appears in no acute distress  2. Psychiatric: Alert, oriented x3, intact insight and judgment  3. Neurologic: Cranial nerves II through grossly  intact, no focal deficit noted  4. HEENMT:  Atraumatic normocephalic, extraocular muscles are intact  5. Respiratory : Bibasilar crackles auscultated  6. Cardiovascular : S1-S2, regular, no murmur auscultated, bilateral 2+ pitting edema to the extremities  7. Gastrointestinal:  Abdomen is soft, distended, nontender to palpation  8. Skin:  No rashes noted     Data Review:    CBC Recent Labs  Lab 04/05/20 1350  WBC 5.1  HGB 10.8*  HCT 33.4*  PLT 242  MCV 83.9  MCH 27.1  MCHC 32.3  RDW 14.0  LYMPHSABS 1.4  MONOABS 0.6  EOSABS 0.2  BASOSABS 0.0   ------------------------------------------------------------------------------------------------------------------  Results for orders placed or performed during the hospital encounter of 04/05/20 (from the past 48 hour(s))  Troponin I (High Sensitivity)     Status: None   Collection Time: 04/05/20  1:50 PM  Result Value Ref Range   Troponin I (High Sensitivity) 14 <18 ng/L    Comment: (NOTE) Elevated high sensitivity troponin I (hsTnI) values and significant  changes across serial measurements may suggest ACS but many other  chronic and acute conditions are known to elevate hsTnI results.  Refer to the "Links" section for chest pain algorithms and additional  guidance. Performed at [facility], [address] [place], [place] [address]   Brain natriuretic peptide     Status: Abnormal   Collection Time: 04/05/20  1:50 PM  Result Value Ref Range   B Natriuretic Peptide 311.0 (H) 0.0 - 100.0 pg/mL    Comment: Performed at [facility], [address] [place], [place] [address]  Comprehensive metabolic panel     Status: Abnormal   Collection Time: 04/05/20  1:50 PM  Result Value Ref Range   Sodium 140 135 - 145 mmol/L   Potassium 3.5 3.5 - 5.1 mmol/L   Chloride 106 98 - 111 mmol/L   CO2 26 22 - 32 mmol/L   Glucose, Bld 192 (H) 70 - 99 mg/dL    Comment: Glucose reference range applies only to samples taken after  fasting for at least 8 hours.   BUN 33 (H) 6 - 20 mg/dL   Creatinine, Ser 1.67 (H) 0.61 - 1.24 mg/dL   Calcium 8.1 (L) 8.9 - 10.3 mg/dL   Total Protein 6.0 (L) 6.5 - 8.1 g/dL   Albumin 3.0 (L) 3.5 - 5.0 g/dL   AST 32 15 -  41 U/L   ALT 35 0 - 44 U/L   Alkaline Phosphatase 94 38 - 126 U/L   Total Bilirubin 0.4 0.3 - 1.2 mg/dL   GFR calc non Af Amer 45 (L) >60 mL/min   GFR calc Af Amer 53 (L) >60 mL/min   Anion gap 8 5 - 15    Comment: Performed at [facility], [address] [place], [place] [address]  CBC with Differential/Platelet     Status: Abnormal   Collection Time: 04/05/20  1:50 PM  Result Value Ref Range   WBC 5.1 4.0 - 10.5 K/uL   RBC 3.98 (L) 4.22 - 5.81 MIL/uL   Hemoglobin 10.8 (L) 13.0 - 17.0 g/dL   HCT 33.4 (L) 39 - 52 %   MCV 83.9 80.0 - 100.0 fL   MCH 27.1 26.0 - 34.0 pg   MCHC 32.3 30.0 - 36.0 g/dL   RDW 14.0 11.5 - 15.5 %   Platelets 242 150 - 400 K/uL   nRBC 0.0 0.0 - 0.2 %   Neutrophils Relative % 58 %   Neutro Abs 2.9 1.7 - 7.7 K/uL   Lymphocytes Relative 28 %   Lymphs Abs 1.4 0.7 - 4.0 K/uL   Monocytes Relative 11 %   Monocytes Absolute 0.6 0 - 1 K/uL   Eosinophils Relative 3 %   Eosinophils Absolute 0.2 0 - 0 K/uL   Basophils Relative 0 %   Basophils Absolute 0.0 0 - 0 K/uL   Immature Granulocytes 0 %   Abs Immature Granulocytes 0.02 0.00 - 0.07 K/uL    Comment: Performed at [facility], [address] [place], [place] [address]  SARS Coronavirus 2 by RT PCR (hospital order, performed in [facility] hospital lab) Nasopharyngeal Nasopharyngeal Swab     Status: None   Collection Time: 04/05/20  1:53 PM   Specimen: Nasopharyngeal Swab  Result Value Ref Range   SARS Coronavirus 2 NEGATIVE NEGATIVE    Comment: (NOTE) SARS-CoV-2 target nucleic acids are NOT DETECTED.  The SARS-CoV-2 RNA is generally detectable in upper and lower respiratory specimens during the acute phase of infection. The lowest concentration of SARS-CoV-2 viral copies this assay  can detect is 250 copies / mL. A negative result does not preclude SARS-CoV-2 infection and should not be used as the sole basis for treatment or other patient management decisions.  A negative result may occur with improper specimen collection / handling, submission of specimen other than nasopharyngeal swab, presence of viral mutation(s) within the areas targeted by this assay, and inadequate number of viral copies (<250 copies / mL). A negative result must be combined with clinical observations, patient history, and epidemiological information.  Fact Sheet for Patients:   StrictlyIdeas.no  Fact Sheet for Healthcare Providers: BankingDealers.co.za  This test is not yet approved or  cleared by the [place] FDA and has been authorized for detection and/or diagnosis of SARS-CoV-2 by FDA under an Emergency Use Authorization (EUA).  This EUA will remain in effect (meaning this test can be used) for the duration of the COVID-19 declaration under Section 564(b)(1) of the Act, 21 U.S.C. section 360bbb-3(b)(1), unless the authorization is terminated or revoked sooner.  Performed at [facility], [address], [place], [place] [address]   Troponin I (High Sensitivity)     Status: None   Collection Time: 04/05/20  3:31 PM  Result Value Ref Range   Troponin I (High Sensitivity) 14 <18 ng/L    Comment: (NOTE) Elevated high sensitivity  troponin I (hsTnI) values and significant  changes across serial measurements may suggest ACS but many other  chronic and acute conditions are known to elevate hsTnI results.  Refer to the "Links" section for chest pain algorithms and additional  guidance. Performed at [facility], [address], [place], [place] [address]     Chemistries  Recent Labs  Lab 04/05/20 1350  NA 140  K 3.5  CL 106  CO2 26  GLUCOSE 192*  BUN 33*  CREATININE 1.67*  CALCIUM 8.1*  AST 32  ALT 35  ALKPHOS 94  BILITOT  0.4   ------------------------------------------------------------------------------------------------------------------  ------------------------------------------------------------------------------------------------------------------ GFR: Estimated Creatinine Clearance: 66.6 mL/min (A) (by C-G formula based on SCr of 1.67 mg/dL (H)). Liver Function Tests:    Imaging Results:    DG Chest Portable 1 View  Result Date: 04/05/2020 CLINICAL DATA:  Shortness of breath and lower extremity edema EXAM: PORTABLE CHEST 1 VIEW COMPARISON:  March 03, 2020 FINDINGS: There is a small left pleural effusion. There is mild left base atelectasis. Lungs elsewhere are clear. Heart is borderline enlarged with pulmonary vascularity normal. No adenopathy. No bone lesions. IMPRESSION: Small left pleural effusion with mild left base atelectasis. Lungs elsewhere clear. Stable cardiac prominence. No adenopathy appreciable. Electronically Signed   By: Lowella Grip III M.D.   On: 04/05/2020 13:40    My personal review of EKG: Rhythm NSR, right axis deviation   Assessment & Plan:    Active Problems:   Bilateral leg edema  1. Bilateral leg edema-concern for? Acute diastolic versus systolic CHF. He also has bibasilar crackles. Will obtain echocardiogram in a.m. Patient received Lasix 40 mg IV in the ED. Will start Lasix 40 mg IV every 2 hour. Strict intake and output. Daily weights. Check BMP in a.m. Consult cardiology in a.m. 2. Diabetes mellitus type 2-we will start sliding scale insulin NovoLog. Hold Metformin. 3. Hypertension-blood pressure is significantly elevated. Will start Coreg 6.25 mg p.o. twice daily, continue home medication including amlodipine 5 mg daily, lisinopril 10 mg daily. Start hydralazine 25 mg p.o. every 6 hours as needed for BP greater than 160/100. 4. Hyperlipidemia-continue Pravachol   DVT Prophylaxis-   Lovenox   AM Labs Ordered, also please review Full Orders  Family  Communication: Admission, patients condition and plan of care including tests being ordered have been discussed with the patient  who indicate understanding and agree with the plan and Code Status.  Code Status: Full code  Admission status: Observation/Inpatient :The appropriate admission status for this patient is INPATIENT. Inpatient status is judged to be reasonable and necessary in order to provide the required intensity of service to ensure the patient's safety. The patient's presenting symptoms, physical exam findings, and initial radiographic and laboratory data in the context of their chronic comorbidities is felt to place them at high risk for further clinical deterioration. Furthermore, it is not anticipated that the patient will be medically stable for discharge from the hospital within 2 midnights of admission. The following factors support the admission status of inpatient.     The patient's presenting symptoms include leg swelling and shortness of breath. The worrisome physical exam findings include bilateral lower extremity edema, bibasilar crackles. The initial radiographic and laboratory data are worrisome because of CHF. The chronic co-morbidities include diabetes mellitus type 2.       * I certify that at the point of admission it is my clinical judgment that the patient will require inpatient hospital care spanning beyond 2 midnights from  the point of admission due to high intensity of service, high risk for further deterioration and high frequency of surveillance required.*  Time spent in minutes : 60 minutes   Gale Hulse S Stormie Ventola M.D

## 2020-04-05 NOTE — ED Notes (Signed)
Oxygen sat  down to 70's while sleeping.  Pt woke easy when spoke name.  Pt states he does have a history of sleep apnea,  But does not wear a mask.  Placed on [place] 2 liters.

## 2020-04-05 NOTE — ED Provider Notes (Signed)
[facility] EMERGENCY DEPARTMENT Provider Note   CSN: [id number] Arrival date & time: 04/05/20  1309     History Chief Complaint  Patient presents with   Shortness of [name] is a 54 y.o. male with a history of DM and HTN, presenting with persistent shortness of breath and worsening bilateral lower extremity edema, he also notes his abdomen feels swollen as well since his last visit here on August 11.  At that time he was diagnosed with community-acquired pneumonia, completed a partial course of cefdinir which made him feel bad so stopped taking, since then has had increased shortness of breath and bilateral lower extremity edema.  He reports cough which has been dry, positive for orthopnea and difficulty with sob with ambulation.  He denies chest pain, dizziness, palpitations, also denies lower extremity pain, but his legs feel heavy, especially after a night on his feet (works nights).  He has not taken his morning bp medications today prior to arrival.   The history is provided by the patient.       Past Medical History:  Diagnosis Date   Diabetes mellitus    Hypertension     There are no problems to display for this patient.   Past Surgical History:  Procedure Laterality Date   INCISION AND DRAINAGE     KNEE SURGERY         No family history on file.  Social History   Tobacco Use   Smoking status: Never Smoker   Smokeless tobacco: Never Used  Substance Use Topics   Alcohol use: No   Drug use: No    Home Medications Prior to Admission medications   Medication Sig Start Date End Date Taking? Authorizing Provider  aspirin 325 MG tablet Take 325 mg by mouth every 6 (six) hours as needed for mild pain.   Yes [provider]  cefdinir (OMNICEF) 300 MG capsule Take 1 capsule (300 mg total) by mouth 2 (two) times daily. 03/03/20  Yes Pollina, Gwenyth Allegra, MD  glimepiride (AMARYL) 4 MG tablet Take 1 tablet (4 mg total) by mouth  daily before breakfast. Patient taking differently: Take 4 mg by mouth in the morning and at bedtime.  07/24/11 04/05/20 Yes [name], MD  ibuprofen (ADVIL,MOTRIN) 200 MG tablet Take 400 mg by mouth every 6 (six) hours as needed for mild pain.    Yes [provider]  lisinopril (ZESTRIL) 10 MG tablet Take 10 mg by mouth daily.   Yes [provider]  metFORMIN (GLUCOPHAGE-XR) 500 MG 24 hr tablet Take 500 mg by mouth in the morning and at bedtime.   Yes [provider]  Multiple Vitamin (MULTIVITAMIN WITH MINERALS) TABS tablet Take 1 tablet by mouth daily.   Yes [provider]  pravastatin (PRAVACHOL) 40 MG tablet Take 40 mg by mouth daily.   Yes [provider]  amLODipine (NORVASC) 5 MG tablet Take 1 tablet (5 mg total) by mouth daily. Patient not taking: Reported on 04/05/2020 03/03/20   Orpah Greek, MD  azithromycin (ZITHROMAX Z-PAK) 250 MG tablet Take 1 tablet (250 mg total) by mouth daily. Patient not taking: Reported on 04/05/2020 03/03/20   Orpah Greek, MD  colchicine 0.6 MG tablet Take 1 tablet (0.6 mg total) by mouth 2 (two) times daily. Patient not taking: Reported on 04/05/2020 01/02/14   Fransico Meadow, PA-C    Allergies    Patient has no known allergies.  Review of  Systems   Review of Systems  Constitutional: Negative for chills and fever.  HENT: Negative for congestion.   Eyes: Negative.   Respiratory: Positive for cough and shortness of breath. Negative for chest tightness and wheezing.   Cardiovascular: Positive for leg swelling. Negative for chest pain and palpitations.  Gastrointestinal: Negative for abdominal pain, nausea and vomiting.  Genitourinary: Negative.   Musculoskeletal: Negative for arthralgias, joint swelling and neck pain.  Skin: Negative.  Negative for rash and wound.  Neurological: Negative for dizziness, weakness, light-headedness, numbness and headaches.  Psychiatric/Behavioral:  Negative.     Physical Exam Updated Vital Signs BP (!) 184/107    Pulse 80    Temp 98.1 F (36.7 C) (Oral)    Resp 18    Ht 5\' 11"  (1.803 m)    Wt 122.5 kg    SpO2 95%    BMI 37.66 kg/m   Physical Exam Vitals and nursing note reviewed.  Constitutional:      Appearance: He is well-developed.  HENT:     Head: Normocephalic and atraumatic.  Eyes:     Conjunctiva/sclera: Conjunctivae normal.  Neck:     Vascular: No JVD.  Cardiovascular:     Rate and Rhythm: Normal rate and regular rhythm.     Heart sounds: Normal heart sounds.  Pulmonary:     Effort: Pulmonary effort is normal.     Breath sounds: Examination of the left-lower field reveals rales. Rales present. No wheezing or rhonchi.  Abdominal:     General: Bowel sounds are normal.     Palpations: Abdomen is soft.     Tenderness: There is no abdominal tenderness. There is no guarding.  Musculoskeletal:        General: Normal range of motion.     Cervical back: Normal range of motion.     Right lower leg: Edema present.     Left lower leg: Edema present.     Comments: Bilateral pitting edema to knees.   Skin:    General: Skin is warm and dry.  Neurological:     Mental Status: He is alert.     ED Results / Procedures / Treatments   Labs (all labs ordered are listed, but only abnormal results are displayed) Labs Reviewed  BRAIN NATRIURETIC PEPTIDE - Abnormal; Notable for the following components:      Result Value   B Natriuretic Peptide 311.0 (*)    All other components within normal limits  COMPREHENSIVE METABOLIC PANEL - Abnormal; Notable for the following components:   Glucose, Bld 192 (*)    BUN 33 (*)    Creatinine, Ser 1.67 (*)    Calcium 8.1 (*)    Total Protein 6.0 (*)    Albumin 3.0 (*)    GFR calc non Af Amer 45 (*)    GFR calc Af Amer 53 (*)    All other components within normal limits  CBC WITH DIFFERENTIAL/PLATELET - Abnormal; Notable for the following components:   RBC 3.98 (*)    Hemoglobin  10.8 (*)    HCT 33.4 (*)    All other components within normal limits  SARS CORONAVIRUS 2 BY RT PCR (HOSPITAL ORDER, [facility])  TROPONIN I (HIGH SENSITIVITY)  TROPONIN I (HIGH SENSITIVITY)    EKG EKG Interpretation  Date/Time:  Monday April 05 2020 13:26:54 EDT Ventricular Rate:  87 PR Interval:    QRS Duration: 96 QT Interval:  385 QTC Calculation: 464 R Axis:  101 Text Interpretation: Sinus rhythm Right axis deviation Confirmed by Milton Ferguson [phone]) on 04/05/2020 1:55:20 PM   Radiology DG Chest Portable 1 View  Result Date: 04/05/2020 CLINICAL DATA:  Shortness of breath and lower extremity edema EXAM: PORTABLE CHEST 1 VIEW COMPARISON:  March 03, 2020 FINDINGS: There is a small left pleural effusion. There is mild left base atelectasis. Lungs elsewhere are clear. Heart is borderline enlarged with pulmonary vascularity normal. No adenopathy. No bone lesions. IMPRESSION: Small left pleural effusion with mild left base atelectasis. Lungs elsewhere clear. Stable cardiac prominence. No adenopathy appreciable. Electronically Signed   By: Lowella Grip III M.D.   On: 04/05/2020 13:40    Procedures Procedures (including critical care time)  Medications Ordered in ED Medications  labetalol (NORMODYNE) injection 20 mg (20 mg Intravenous Given 04/05/20 1422)  furosemide (LASIX) injection 40 mg (40 mg Intravenous Given 04/05/20 1422)  lisinopril (ZESTRIL) tablet 10 mg (10 mg Oral Given 04/05/20 1640)    ED Course  I have reviewed the triage vital signs and the nursing notes.  Pertinent labs & imaging results that were available during my care of the patient were reviewed by me and considered in my medical decision making (see chart for details).    MDM Rules/Calculators/A&P                          Pt with new onset CHF and acute renal injury in setting of HTN and noncompliance (at least today) with bp meds, pt stating he does take his meds  most days however. Labs significant for an elevated BNP at 311,  Creatinine 1.67, BUN 33, comparison creatinine 0.83, although this was from 2012, pt denies being told he had any kidney problems prior to today.    He was given labetolol IV and had transient improvement in BP,  Added PO lisinopril since he missed this am's dose, also given Lasix IV  40 mg.  He will benefit from admission for further diuresis along with monitoring of his renal function status.    Pt is covid negative.    Discussed with Dr. Darrick Meigs who accepts pt for admission.    Final Clinical Impression(s) / ED Diagnoses Final diagnoses:  Acute congestive heart failure, unspecified heart failure type ([place])  Acute renal failure, unspecified acute renal failure type [facility])  Essential hypertension    Rx / DC Orders ED Discharge Orders    None       Landis Martins 04/05/20 1750    Milton Ferguson, MD 04/07/20 1249

## 2020-04-05 NOTE — ED Triage Notes (Signed)
Pt presents wit continues sob and bilateral leg edema after being treated for pneumonia  Patient is being discharged from the Urgent Care and sent to the Emergency Department via private vehicle  . Per B W[place]st , patient is in need of higher level of care to rule out CHF . Patient is aware and verbalizes understanding of plan of care.  Vitals:   04/05/20 1259  BP: (!) 200/120  Pulse: (!) 18  Resp: 20  Temp: 98.9 F (37.2 C)  SpO2: 95%

## 2020-04-05 NOTE — ED Notes (Signed)
Pt resting quietly.  Does c/o SOB.  Edema to lower extremities times one month.  Pt no visible distress.

## 2020-04-05 NOTE — ED Triage Notes (Signed)
Pt presents to ED with continued SOB and bilateral leg edema. Pt also c/o constipation

## 2020-04-06 ENCOUNTER — Inpatient Hospital Stay (HOSPITAL_COMMUNITY): Payer: Commercial Managed Care - PPO

## 2020-04-06 ENCOUNTER — Encounter (HOSPITAL_COMMUNITY): Payer: Self-pay | Admitting: Family Medicine

## 2020-04-06 DIAGNOSIS — E785 Hyperlipidemia, unspecified: Secondary | ICD-10-CM

## 2020-04-06 DIAGNOSIS — I160 Hypertensive urgency: Secondary | ICD-10-CM

## 2020-04-06 DIAGNOSIS — E1165 Type 2 diabetes mellitus with hyperglycemia: Secondary | ICD-10-CM

## 2020-04-06 DIAGNOSIS — I5031 Acute diastolic (congestive) heart failure: Secondary | ICD-10-CM

## 2020-04-06 LAB — ECHOCARDIOGRAM COMPLETE
AR max vel: 2.66 cm2
AV Area VTI: 2.73 cm2
AV Area mean vel: 2.51 cm2
AV Mean grad: 2 mmHg
AV Peak grad: 3.9 mmHg
Ao pk vel: 0.99 m/s
Area-P 1/2: 3.53 cm2
Height: 71 in
MV M vel: 2.51 m/s
MV Peak grad: 25.2 mmHg
S' Lateral: 3.99 cm
Weight: 4320 oz

## 2020-04-06 LAB — CBC
HCT: 32.3 % — ABNORMAL LOW (ref 39.0–52.0)
Hemoglobin: 10.5 g/dL — ABNORMAL LOW (ref 13.0–17.0)
MCH: 27.3 pg (ref 26.0–34.0)
MCHC: 32.5 g/dL (ref 30.0–36.0)
MCV: 83.9 fL (ref 80.0–100.0)
Platelets: 252 10*3/uL (ref 150–400)
RBC: 3.85 MIL/uL — ABNORMAL LOW (ref 4.22–5.81)
RDW: 14.2 % (ref 11.5–15.5)
WBC: 4.8 10*3/uL (ref 4.0–10.5)
nRBC: 0 % (ref 0.0–0.2)

## 2020-04-06 LAB — GLUCOSE, CAPILLARY
Glucose-Capillary: 163 mg/dL — ABNORMAL HIGH (ref 70–99)
Glucose-Capillary: 164 mg/dL — ABNORMAL HIGH (ref 70–99)

## 2020-04-06 LAB — BASIC METABOLIC PANEL
Anion gap: 8 (ref 5–15)
BUN: 30 mg/dL — ABNORMAL HIGH (ref 6–20)
CO2: 28 mmol/L (ref 22–32)
Calcium: 8.2 mg/dL — ABNORMAL LOW (ref 8.9–10.3)
Chloride: 106 mmol/L (ref 98–111)
Creatinine, Ser: 1.58 mg/dL — ABNORMAL HIGH (ref 0.61–1.24)
GFR calc Af Amer: 56 mL/min — ABNORMAL LOW (ref >60)
GFR calc non Af Amer: 49 mL/min — ABNORMAL LOW (ref >60)
Glucose, Bld: 138 mg/dL — ABNORMAL HIGH (ref 70–99)
Potassium: 3.6 mmol/L (ref 3.5–5.1)
Sodium: 142 mmol/L (ref 135–145)

## 2020-04-06 LAB — HEMOGLOBIN A1C
Hgb A1c MFr Bld: 9.4 % — ABNORMAL HIGH (ref 4.8–5.6)
Hgb A1c MFr Bld: 9.5 % — ABNORMAL HIGH (ref 4.8–5.6)
Mean Plasma Glucose: 223.08 mg/dL
Mean Plasma Glucose: 225.95 mg/dL

## 2020-04-06 LAB — CBG MONITORING, ED
Glucose-Capillary: 140 mg/dL — ABNORMAL HIGH (ref 70–99)
Glucose-Capillary: 182 mg/dL — ABNORMAL HIGH (ref 70–99)

## 2020-04-06 LAB — HIV ANTIBODY (ROUTINE TESTING W REFLEX): HIV Screen 4th Generation wRfx: NONREACTIVE

## 2020-04-06 MED ORDER — AMLODIPINE BESYLATE 5 MG PO TABS
10.0000 mg | ORAL_TABLET | Freq: Every day | ORAL | Status: DC
  Administered 2020-04-07 – 2020-04-10 (×4): 10 mg via ORAL
  Filled 2020-04-06 (×4): qty 2

## 2020-04-06 MED ORDER — CARVEDILOL 12.5 MG PO TABS: 12.5000 mg | ORAL_TABLET | Freq: Two times a day (BID) | ORAL | Status: DC

## 2020-04-06 MED ORDER — HYDRALAZINE HCL 25 MG PO TABS
25.0000 mg | ORAL_TABLET | Freq: Three times a day (TID) | ORAL | Status: DC
  Administered 2020-04-07: 25 mg via ORAL
  Filled 2020-04-06 (×3): qty 1

## 2020-04-06 MED ORDER — FUROSEMIDE 10 MG/ML IJ SOLN: 20.0000 mg | Freq: Two times a day (BID) | INTRAMUSCULAR | Status: DC

## 2020-04-06 MED ORDER — AMLODIPINE BESYLATE 5 MG PO TABS
5.0000 mg | ORAL_TABLET | Freq: Every day | ORAL | Status: DC
  Administered 2020-04-06: 5 mg via ORAL
  Filled 2020-04-06: qty 1

## 2020-04-06 MED ORDER — FUROSEMIDE 10 MG/ML IJ SOLN
40.0000 mg | Freq: Two times a day (BID) | INTRAMUSCULAR | Status: DC
  Administered 2020-04-06 – 2020-04-10 (×8): 40 mg via INTRAVENOUS
  Filled 2020-04-06 (×9): qty 4

## 2020-04-06 MED ORDER — AMLODIPINE BESYLATE 5 MG PO TABS: 2.5000 mg | ORAL_TABLET | Freq: Every day | ORAL | Status: DC

## 2020-04-06 MED ORDER — AMLODIPINE BESYLATE 5 MG PO TABS
5.0000 mg | ORAL_TABLET | Freq: Once | ORAL | Status: AC
  Administered 2020-04-06: 5 mg via ORAL
  Filled 2020-04-06: qty 1

## 2020-04-06 NOTE — Consult Note (Addendum)
Cardiology Consult    Patient ID: [name]; [id number]; March 30, 1966   Admit date: 04/05/2020 Date of Consult: 04/06/2020  Primary Care Provider: [name], MD Primary Cardiologist: New to [facility] - Dr. Harl Bowie  Patient Profile    [name] is a 54 y.o. male with past medical history of HTN, HLD, and Type 2 DM who is being seen today for the evaluation of CHF at the request of Dr. Darrick Meigs.   History of Present Illness    [name] was recently evaluated at [facility] ED last month for URI symptoms including dyspnea, a productive cough and nasal congestion. Was negative for COVID-19 but CXR did show bibasilar airspace disease concerning for PNA and small bilateral pleural effusions. Was treated for CAP with Omnicef and Azithromycin along with Amlodipine 5 mg daily being added for elevated BP.   He presented back to the ED on 04/05/2020 for evaluation of worsening dyspnea and lower extremity edema for the past month. In talking with the patient today, he reports having worsening dyspnea on exertion which actually started prior to his ED evaluation in 02/2020.  He noticed this initially with exertion but says over the past several weeks he has developed orthopnea and PND. He has always slept with 2-3 pillows at night but is now having to sleep with more. Says he wakes up in the night and cannot catch his breath. Also reports a productive cough. Denies any recent chest pain or palpitations. He has experienced worsening lower extremity edema and abdominal distention. Reports his edema worsened after he was started on Amlodipine 5 mg daily and this was reduced by his PCP to 2.5 mg daily with improvement in his symptoms.   He denies any known history of CAD or CHF. No prior tobacco use. No known family history of CAD or CHF.  Reports his mother passed away from dementia and his father is still living has Type II DM.  BP was initially significantly elevated to 184/107 while in the ED.  Initial labs show WBC 5.1, Hgb 10.8, platelets 242, Na+ 140, K+ 3.5 and creatinine 1.67 (no recent labs available for comparison - previously 0.8 in 2012). BNP 311. Initial and delta HS Troponin values negative. COVID negative. CXR showing a small left pleural effusion. EKG shows NSR, HR 88 with LPFB. Nonspecific ST abnormality along inferior leads.   He was started on IV Lasix 40mg  BID on admission and reports significant urine output thus far (recorded amount of -1.4 L and additional 500 + mL in urinal this AM). Was continued on Lisinopril 10mg  daily and Amlodipine 5 mg daily with Coreg 6.25mg  BID being added to his medication regimen.    Past Medical History:  Diagnosis Date  . Diabetes mellitus   . Hypertension     Past Surgical History:  Procedure Laterality Date  . INCISION AND DRAINAGE    . KNEE SURGERY       Home Medications:  Prior to Admission medications   Medication Sig Start Date End Date Taking? Authorizing Provider  aspirin 325 MG tablet Take 325 mg by mouth every 6 (six) hours as needed for mild pain.   Yes [provider]  cefdinir (OMNICEF) 300 MG capsule Take 1 capsule (300 mg total) by mouth 2 (two) times daily. 03/03/20  Yes Pollina, Gwenyth Allegra, MD  glimepiride (AMARYL) 4 MG tablet Take 1 tablet (4 mg total) by mouth daily before breakfast. Patient taking differently: Take 4 mg by mouth in the  morning and at bedtime.  07/24/11 04/05/20 Yes [name], MD  ibuprofen (ADVIL,MOTRIN) 200 MG tablet Take 400 mg by mouth every 6 (six) hours as needed for mild pain.    Yes [provider]  lisinopril (ZESTRIL) 10 MG tablet Take 10 mg by mouth daily.   Yes [provider]  metFORMIN (GLUCOPHAGE-XR) 500 MG 24 hr tablet Take 500 mg by mouth in the morning and at bedtime.   Yes [provider]  Multiple Vitamin (MULTIVITAMIN WITH MINERALS) TABS tablet Take 1 tablet by mouth daily.   Yes [provider]  pravastatin (PRAVACHOL) 40  MG tablet Take 40 mg by mouth daily.   Yes [provider]  amLODipine (NORVASC) 5 MG tablet Take 1 tablet (5 mg total) by mouth daily. Patient not taking: Reported on 04/05/2020 03/03/20   Orpah Greek, MD  azithromycin (ZITHROMAX Z-PAK) 250 MG tablet Take 1 tablet (250 mg total) by mouth daily. Patient not taking: Reported on 04/05/2020 03/03/20   Orpah Greek, MD  colchicine 0.6 MG tablet Take 1 tablet (0.6 mg total) by mouth 2 (two) times daily. Patient not taking: Reported on 04/05/2020 01/02/14   Fransico Meadow, PA-C    Inpatient Medications: Scheduled Meds: . amLODipine  5 mg Oral Daily  . carvedilol  12.5 mg Oral BID WC  . enoxaparin (LOVENOX) injection  40 mg Subcutaneous Q12H  . furosemide  40 mg Intravenous BID  . hydrALAZINE  25 mg Oral Q8H  . insulin aspart  0-9 Units Subcutaneous TID WC  . pravastatin  40 mg Oral Daily  . sodium chloride flush  3 mL Intravenous Q12H   Continuous Infusions: . sodium chloride     PRN Meds: sodium chloride, hydrALAZINE, ondansetron **OR** ondansetron (ZOFRAN) IV, sodium chloride flush  Allergies:   No Known Allergies  Social History:   Social History   Socioeconomic History  . Marital status: Married    Spouse name: Not on file  . Number of children: Not on file  . Years of education: Not on file  . Highest education level: Not on file  Occupational History  . Not on file  Tobacco Use  . Smoking status: Never Smoker  . Smokeless tobacco: Never Used  Substance and Sexual Activity  . Alcohol use: No  . Drug use: No  . Sexual activity: Not on file  Other Topics Concern  . Not on file  Social History Narrative  . Not on file   Social Determinants of Health   Financial Resource Strain:   . Difficulty of Paying Living Expenses: Not on file  Food Insecurity:   . Worried About Charity fundraiser in the Last Year: Not on file  . Ran Out of Food in the Last Year: Not on file  Transportation Needs:     . Lack of Transportation (Medical): Not on file  . Lack of Transportation (Non-Medical): Not on file  Physical Activity:   . Days of Exercise per Week: Not on file  . Minutes of Exercise per Session: Not on file  Stress:   . Feeling of Stress : Not on file  Social Connections:   . Frequency of Communication with Friends and Family: Not on file  . Frequency of Social Gatherings with Friends and Family: Not on file  . Attends Religious Services: Not on file  . Active Member of Clubs or Organizations: Not on file  . Attends Archivist Meetings: Not on file  .  Marital Status: Not on file  Intimate Partner Violence:   . Fear of Current or Ex-Partner: Not on file  . Emotionally Abused: Not on file  . Physically Abused: Not on file  . Sexually Abused: Not on file     Family History:    Family History  Problem Relation Age of Onset  . Dementia Mother       Review of Systems    General:  No chills, fever, night sweats or weight changes.  Cardiovascular:  No chest pain, palpitations. Positive for dyspnea on exertion, edema and orthopnea. Dermatological: No rash, lesions/masses Respiratory: Positive for cough and dyspnea. Urologic: No hematuria, dysuria Abdominal:   No nausea, vomiting, diarrhea, bright red blood per rectum, melena, or hematemesis Neurologic:  No visual changes, wkns, changes in mental status. All other systems reviewed and are otherwise negative except as noted above.  Physical Exam/Data    Vitals:   04/06/20 0700 04/06/20 0800 04/06/20 0815 04/06/20 0830  BP: (!) 144/83 (!) 189/106  (!) 179/102  Pulse: 79 82 83 83  Resp:      Temp:      TempSrc:      SpO2: 94% 96% 96% 96%  Weight:      Height:        Intake/Output Summary (Last 24 hours) at 04/06/2020 0858 Last data filed at 04/05/2020 1748 Gross per 24 hour  Intake --  Output 1425 ml  Net -1425 ml   Filed Weights   04/05/20 1315  Weight: 122.5 kg   Body mass index is 37.66 kg/m.    General: Pleasant male appearing in NAD Psych: Normal affect. Neuro: Alert and oriented X 3. Moves all extremities spontaneously. HEENT: Normal  Neck: Supple without bruits. JVD at 9 cm. Lungs:  Resp regular and unlabored, rales along bases bilaterally. Heart: RRR no s3, s4, or murmurs. Abdomen: Soft, non-tender, non-distended, BS + x 4.  Extremities: No clubbing or cyanosis. 2+ pitting edema bilaterally. DP/PT/Radials 2+ and equal bilaterally.   EKG:  The EKG was personally reviewed and demonstrates: NSR, HR 88 with LPFB. Nonspecific ST abnormality along inferior leads.   Telemetry:  Telemetry was personally reviewed and demonstrates: NSR, HR in 70's to 80's No significant arrhythmias.    Labs/Studies     Relevant CV Studies:  Echocardiogram: Pending  Laboratory Data:  Chemistry Recent Labs  Lab 04/05/20 1350  NA 140  K 3.5  CL 106  CO2 26  GLUCOSE 192*  BUN 33*  CREATININE 1.67*  CALCIUM 8.1*  GFRNONAA 45*  GFRAA 53*  ANIONGAP 8    Recent Labs  Lab 04/05/20 1350  PROT 6.0*  ALBUMIN 3.0*  AST 32  ALT 35  ALKPHOS 94  BILITOT 0.4   Hematology Recent Labs  Lab 04/05/20 1350 04/06/20 0249  WBC 5.1 4.8  RBC 3.98* 3.85*  HGB 10.8* 10.5*  HCT 33.4* 32.3*  MCV 83.9 83.9  MCH 27.1 27.3  MCHC 32.3 32.5  RDW 14.0 14.2  PLT 242 252   Cardiac EnzymesNo results for input(s): TROPONINI in the last 168 hours. No results for input(s): TROPIPOC in the last 168 hours.  BNP Recent Labs  Lab 04/05/20 1350  BNP 311.0*    DDimer No results for input(s): DDIMER in the last 168 hours.  Radiology/Studies:  DG Chest Portable 1 View  Result Date: 04/05/2020 CLINICAL DATA:  Shortness of breath and lower extremity edema EXAM: PORTABLE CHEST 1 VIEW COMPARISON:  March 03, 2020 FINDINGS: There is a  small left pleural effusion. There is mild left base atelectasis. Lungs elsewhere are clear. Heart is borderline enlarged with pulmonary vascularity normal. No  adenopathy. No bone lesions. IMPRESSION: Small left pleural effusion with mild left base atelectasis. Lungs elsewhere clear. Stable cardiac prominence. No adenopathy appreciable. Electronically Signed   By: Lowella Grip III M.D.   On: 04/05/2020 13:40     Assessment & Plan    1. Acute CHF Exacerbation (Echo pending to determine Systolic versus Diastolic Dysfunction) - He presented with a 1+ month history of progressive dyspnea on exertion, orthopnea and edema. Initially treated for PNA but did not experience improvement in his symptoms.  - BNP elevated to 311. Initial and delta HS Troponin values negative. Echo pending.  - Would continue with IV Lasix 40mg  BID as he reports a good urinary response with this and has already noticed improvement in his respiratory status. Remains volume overloaded and will likely require several days of IV diuresis. Will order BMET for this AM. Follow I&O's along with daily weights.  - May require further medication adjustments pending the results of his echo. At risk for hypertensive cardiomyopathy given his significantly elevated BP but also has multiple risk factors for CAD (HTN, HLD, and Type 2 DM).   2. Accelerated HTN - BP has been elevated at 135/110 - 200/120 since admission. Says he does not check this regularly but was elevated at his outpatient appointment several weeks ago. He has been continued on Lisinopril and would follow renal function closely as this may need to be discontinued if renal function worsens. He is currently on Amlodipine 2.5mg  daily at home as he experienced worsening edema with higher dosing and will adjust his inpatient dose to this. He has been started on Coreg 6.25mg  BID which can be further titrated.   3. HLD - Followed by his PCP as an outpatient. He has been continued on Pravastatin 40mg  daily.   4. AKI - Creatinine elevated to 1.67 on admission with no recent values available for comparison. Will order repeat BMET for this  AM. Follow with diuresis.  5. Nocturnal Desaturations - Oxygen saturations declined into the 70's overnight by review of notes. He did have a sleep study in 2014 which showed OSA but he does not currently use a CPAP. Would recommend a repeat sleep study as an outpatient.    For questions or updates, please contact [place] Please consult [email or web address] for contact info under Cardiology/STEMI.  Signed, Erma Heritage, PA-C 04/06/2020, 8:58 AM Pager: [phone]  Attending note Patient seen and discussed with PA Ahmed Prima, I agree with her documentation. 54 yo male history of HTN,DM2 admitted with SOB and LE edema. FOund to have severe HTN in er with SBP in 200s, signs of volume overload. Sats were 98% on RA.     ER vitlas 200/120 p 88 98% RA BNP 311 K 3.5 Cr 1.67 BUN 33 WBC 5.1 Hgb 10.8 Plt 242 HgbA1c 9.4  COVID neg hstop 14-->14 CXR small left effusion eKG SR, RAD  Prelim echo read: LVEF 50%, grade II diastolic dysfunction   Acute diastolic HF in setting of severe uncontrolled HTN. I/Os not documented, he had received IV lasix 40mg  x 2 total doses. Downtrend in Cr with diuresis consistnet with venous congestion and CHF. Continue IV diuresis. Essentially hypertensive heart disease with grade II diastolic dysfunction, severe LVH. Suspect poorly controled severe HTN for some time.   Severe HTN on admission SBPs in 200s. Currently on norvasc 5, hydralazine 25mg   tid. Home lisinopril on hold given renal function. Increase norvasc to 10mg , room to titrate hydralzine. Pending Cr trend could restart his ACE-I. Bp's should have some improvement with diuresis as well. With normal LVEF would not start coreg at this time, titrate other hypertensive meds initially.   Carlyle Dolly MD

## 2020-04-06 NOTE — ED Notes (Signed)
Echo in progress.

## 2020-04-06 NOTE — Progress Notes (Addendum)
PROGRESS NOTE    [name]  YIR:[id number] DOB: [birth year] DOA: 04/05/2020 PCP: [name], MD    Chief Complaint  Patient presents with  . Shortness of Breath    Brief Narrative:  [name]  is a 54 y.o. male, with history of hypertension, diabetes mellitus type 2, presented to ED with complaints of 1 month history of bilateral lower extremity swelling and shortness of breath on exertion. Patient states that he has hypertension and has been compliant with taking his medications. He was seen in the ED on 03/03/2020 and was prescribed cefdinir for pneumonia. Patient said that he has noted increasing swelling of lower extremities and has gained weight.  In the ED, patient was found to be in hypertensive urgency, BNP elevated at 311. Creatinine was elevated 1.67, last creatinine from 2012 was 0.83. Patient was given Lasix 40 mg IV x1.   Assessment & Plan:   Active Problems:   Bilateral leg edema  1. Bilateral leg edema- Acute diastolic versus systolic CHF.  -2D echocardiogram ordered.  Patient started on Lasix 40 IV twice daily.  However, his creatinine elevated.  If his creatinine continues to be elevated will consider decreasing the dose of Lasix.  Cardiology consulted.  Continue to monitor intake/output.  Continue to monitor BMP, daily weights.   2. Diabetes mellitus type 2: Patient admits to being noncompliant with his diabetic diet in the past but now trying to be more careful.  Will order hemoglobin A1c.  Given that the creatinine is trending up hold Metformin for now.   -Continue to monitor Accu-Cheks, insulin sliding scale.  If his blood glucose is on the higher side we may need to start him on basal insulin.    3. Hypertension-blood pressure is significantly elevated.  Started on Coreg 6.25 mg twice daily.  Since his blood pressure continues to be high will increase the dose of Coreg, increase the dose of amlodipine.  Given that his creatinine is worsening will hold  lisinopril.  Continue hydralazine.  Continue to monitor blood pressure closely and adjust medications as needed.    4. Hyperlipidemia-continue Pravachol.  Will order fasting lipid profile in a.m.  Follow-up and adjust medications accordingly.  5.  Obstructive sleep apnea-patient says he was diagnosed with obstructive sleep apnea in the past and was recommended to use CPAP.  He says he used to CPAP briefly but felt uncomfortable with the CPAP therefore he stopped using CPAP. -Discussed with the patient about the risks of untreated sleep apnea and encouraged him to start using the CPAP again.    DVT prophylaxis: Subcutaneous Lovenox Code Status: Full code Family Communication: Patient awake and oriented, no family at the bedside. Disposition:   Status is: Inpatient  Remains inpatient appropriate because:Patient with volume overload, CHF exacerbation, uncontrolled hypertension requiring IV diuresis   Dispo: The patient is from: Home              Anticipated d/c is to: Home              Anticipated d/c date is: 2 days              Patient currently is not medically stable to d/c.     Consultants:   Cardiology  Procedures:  None  Antimicrobials:   None    Subjective: He is on oxygen by nasal cannula.  He is still complaining of shortness of breath. Has lower extremity edema.  Blood pressure continues to be elevated. Creatinine worsened today.  Objective: Vitals:   04/06/20 1245 04/06/20 1400 04/06/20 1415 04/06/20 1443  BP:  (!) 158/89  (!) 178/101  Pulse: 86 83 87 88  Resp:    18  Temp:      TempSrc:      SpO2: 100% 95% 99% 97%  Weight:    125.1 kg  Height:    5\' 11"  (1.803 m)    Intake/Output Summary (Last 24 hours) at 04/06/2020 1530 Last data filed at 04/05/2020 1748 Gross per 24 hour  Intake --  Output 825 ml  Net -825 ml   Filed Weights   04/05/20 1315 04/06/20 1443  Weight: 122.5 kg 125.1 kg    Examination:  General exam: Awake and oriented, not in  any acute distress at this time Respiratory system: Decreased breath sounds lower lobes, bibasilar crackles, no rhonchi. Cardiovascular system: S1 & S2, pedal edema. Gastrointestinal system: Abdomen is nondistended, soft and nontender. No organomegaly or masses felt. Normal bowel sounds heard. Central nervous system: Alert and oriented. No focal neurological deficits. Extremities: Lower extremity edema, symmetric 5 x 5 power. Skin: No rashes, lesions or ulcers Psychiatry: Judgement and insight appear normal. Mood & affect appropriate.     Data Reviewed: I have personally reviewed following labs and imaging studies  CBC: Recent Labs  Lab 04/05/20 1350 04/06/20 0249  WBC 5.1 4.8  NEUTROABS 2.9  --   HGB 10.8* 10.5*  HCT 33.4* 32.3*  MCV 83.9 83.9  PLT 242 175    Basic Metabolic Panel: Recent Labs  Lab 04/05/20 1350 04/06/20 0847  NA 140 142  K 3.5 3.6  CL 106 106  CO2 26 28  GLUCOSE 192* 138*  BUN 33* 30*  CREATININE 1.67* 1.58*  CALCIUM 8.1* 8.2*    GFR: Estimated Creatinine Clearance: 71.1 mL/min (A) (by C-G formula based on SCr of 1.58 mg/dL (H)).  Liver Function Tests: Recent Labs  Lab 04/05/20 1350  AST 32  ALT 35  ALKPHOS 94  BILITOT 0.4  PROT 6.0*  ALBUMIN 3.0*    CBG: Recent Labs  Lab 04/06/20 0812 04/06/20 1252  GLUCAP 140* 182*     Recent Results (from the past 240 hour(s))  SARS Coronavirus 2 by RT PCR (hospital order, performed in [facility] hospital lab) Nasopharyngeal Nasopharyngeal Swab     Status: None   Collection Time: 04/05/20  1:53 PM   Specimen: Nasopharyngeal Swab  Result Value Ref Range Status   SARS Coronavirus 2 NEGATIVE NEGATIVE Final    Comment: (NOTE) SARS-CoV-2 target nucleic acids are NOT DETECTED.  The SARS-CoV-2 RNA is generally detectable in upper and lower respiratory specimens during the acute phase of infection. The lowest concentration of SARS-CoV-2 viral copies this assay can detect is 250 copies / mL. A  negative result does not preclude SARS-CoV-2 infection and should not be used as the sole basis for treatment or other patient management decisions.  A negative result may occur with improper specimen collection / handling, submission of specimen other than nasopharyngeal swab, presence of viral mutation(s) within the areas targeted by this assay, and inadequate number of viral copies (<250 copies / mL). A negative result must be combined with clinical observations, patient history, and epidemiological information.  Fact Sheet for Patients:   StrictlyIdeas.no  Fact Sheet for Healthcare Providers: BankingDealers.co.za  This test is not yet approved or  cleared by the [place] FDA and has been authorized for detection and/or diagnosis of SARS-CoV-2 by FDA under an Emergency Use Authorization (EUA).  This EUA will remain in effect (meaning this test can be used) for the duration of the COVID-19 declaration under Section 564(b)(1) of the Act, 21 U.S.C. section 360bbb-3(b)(1), unless the authorization is terminated or revoked sooner.  Performed at [facility], [address], [place], [place] [address]          Radiology Studies: DG Chest Portable 1 View  Result Date: 04/05/2020 CLINICAL DATA:  Shortness of breath and lower extremity edema EXAM: PORTABLE CHEST 1 VIEW COMPARISON:  March 03, 2020 FINDINGS: There is a small left pleural effusion. There is mild left base atelectasis. Lungs elsewhere are clear. Heart is borderline enlarged with pulmonary vascularity normal. No adenopathy. No bone lesions. IMPRESSION: Small left pleural effusion with mild left base atelectasis. Lungs elsewhere clear. Stable cardiac prominence. No adenopathy appreciable. Electronically Signed   By: Lowella Grip III M.D.   On: 04/05/2020 13:40   ECHOCARDIOGRAM COMPLETE  Result Date: 04/06/2020    ECHOCARDIOGRAM REPORT   Patient Name:   [name]  Date of Exam: 04/06/2020 Medical Rec #:  [id number]       Height:       71.0 in Accession #:    [id number]      Weight:       270.0 lb Date of Birth:  1965/08/27       BSA:          2.395 m Patient Age:    70 years        BP:           182/105 mmHg Patient Gender: M               HR:           80 bpm. Exam Location:  [facility] Procedure: 2D Echo Indications:    CHF-Acute Diastolic 528.41 / L24.40  History:        Patient has no prior history of Echocardiogram examinations.                 Risk Factors:Diabetes, Non-Smoker and Hypertension. Bilateral                 Edema.  Sonographer:    Leavy Cella RDCS (AE) Referring Phys: [place]  1. Left ventricular ejection fraction, by estimation, is 50%. The left ventricle has normal function. The left ventricle has no regional wall motion abnormalities. There is severe left ventricular hypertrophy. Left ventricular diastolic parameters are consistent with Grade II diastolic dysfunction (pseudonormalization). Elevated left atrial pressure.  2. Right ventricular systolic function is normal. The right ventricular size is normal.  3. Left atrial size was mildly dilated.  4. Large pleural effusion in the left lateral region.  5. The mitral valve is normal in structure. Trivial mitral valve regurgitation. No evidence of mitral stenosis.  6. The aortic valve is tricuspid. Aortic valve regurgitation is not visualized. No aortic stenosis is present.  7. The inferior vena cava is normal in size with greater than 50% respiratory variability, suggesting right atrial pressure of 3 mmHg. FINDINGS  Left Ventricle: Left ventricular ejection fraction, by estimation, is 50%. The left ventricle has normal function. The left ventricle has no regional wall motion abnormalities. The left ventricular internal cavity size was normal in size. There is severe left ventricular hypertrophy. Left ventricular diastolic parameters are consistent with Grade II diastolic  dysfunction (pseudonormalization). Elevated left atrial pressure. Right Ventricle: The right ventricular size is normal. No increase in right  ventricular wall thickness. Right ventricular systolic function is normal. Left Atrium: Left atrial size was mildly dilated. Right Atrium: Right atrial size was normal in size. Pericardium: There is no evidence of pericardial effusion. Mitral Valve: The mitral valve is normal in structure. Trivial mitral valve regurgitation. No evidence of mitral valve stenosis. Tricuspid Valve: The tricuspid valve is normal in structure. Tricuspid valve regurgitation is not demonstrated. No evidence of tricuspid stenosis. Aortic Valve: The aortic valve is tricuspid. Aortic valve regurgitation is not visualized. No aortic stenosis is present. Aortic valve mean gradient measures 2.0 mmHg. Aortic valve peak gradient measures 3.9 mmHg. Aortic valve area, by VTI measures 2.73 cm. Pulmonic Valve: The pulmonic valve was not well visualized. Pulmonic valve regurgitation is not visualized. No evidence of pulmonic stenosis. Aorta: The aortic root is normal in size and structure. Pulmonary Artery: Indeterminant PASP, inadequate TR jet. Venous: The inferior vena cava is normal in size with greater than 50% respiratory variability, suggesting right atrial pressure of 3 mmHg. IAS/Shunts: No atrial level shunt detected by color flow Doppler. Additional Comments: There is a large pleural effusion in the left lateral region.  LEFT VENTRICLE PLAX 2D LVIDd:         5.09 cm  Diastology LVIDs:         3.99 cm  LV e' medial:    7.29 cm/s LV PW:         1.50 cm  LV E/e' medial:  13.0 LV IVS:        1.42 cm  LV e' lateral:   6.42 cm/s LVOT diam:     2.10 cm  LV E/e' lateral: 14.8 LV SV:         52 LV SV Index:   22 LVOT Area:     3.46 cm  RIGHT VENTRICLE RV S prime:     11.00 cm/s TAPSE (M-mode): 2.5 cm LEFT ATRIUM           Index       RIGHT ATRIUM           Index LA diam:      4.50 cm 1.88 cm/m  RA Area:      15.00 cm LA Vol (A2C): 65.3 ml 27.26 ml/m RA Volume:   40.70 ml  16.99 ml/m LA Vol (A4C): 66.0 ml 27.55 ml/m  AORTIC VALVE AV Area (Vmax):    2.66 cm AV Area (Vmean):   2.51 cm AV Area (VTI):     2.73 cm AV Vmax:           98.82 cm/s AV Vmean:          67.098 cm/s AV VTI:            0.191 m AV Peak Grad:      3.9 mmHg AV Mean Grad:      2.0 mmHg LVOT Vmax:         75.84 cm/s LVOT Vmean:        48.631 cm/s LVOT VTI:          0.151 m LVOT/AV VTI ratio: 0.79  AORTA Ao Root diam: 2.80 cm MITRAL VALVE MV Area (PHT): 3.53 cm    SHUNTS MV Decel Time: 215 msec    Systemic VTI:  0.15 m MR Peak grad: 25.2 mmHg    Systemic Diam: 2.10 cm MR Vmax:      251.00 cm/s MV E velocity: 95.10 cm/s MV A velocity: 20.10 cm/s MV E/A ratio:  4.73 Carlyle Dolly MD Electronically  signed by Carlyle Dolly MD Signature Date/Time: 04/06/2020/11:04:27 AM    Final     Scheduled Meds: . Derrill Memo ON 04/07/2020] amLODipine  10 mg Oral Daily  . enoxaparin (LOVENOX) injection  40 mg Subcutaneous Q12H  . furosemide  40 mg Intravenous BID  . hydrALAZINE  25 mg Oral Q8H  . insulin aspart  0-9 Units Subcutaneous TID WC  . pravastatin  40 mg Oral Daily  . sodium chloride flush  3 mL Intravenous Q12H   Continuous Infusions: . sodium chloride       LOS: 1 day    Yaakov Guthrie, MD [facility]   To contact the attending provider between 7A-7P or the covering provider during after hours 7P-7A, please log into the web site [email or web address] and access using universal [place] password for that web site. If you do not have the password, please call the hospital operator.  04/06/2020, 3:30 PM

## 2020-04-06 NOTE — Progress Notes (Signed)
*  PRELIMINARY RESULTS* Echocardiogram 2D Echocardiogram has been performed.  Leavy Cella 04/06/2020, 10:06 AM

## 2020-04-07 DIAGNOSIS — Z6838 Body mass index (BMI) 38.0-38.9, adult: Secondary | ICD-10-CM

## 2020-04-07 DIAGNOSIS — N182 Chronic kidney disease, stage 2 (mild): Secondary | ICD-10-CM

## 2020-04-07 DIAGNOSIS — I5031 Acute diastolic (congestive) heart failure: Secondary | ICD-10-CM

## 2020-04-07 DIAGNOSIS — E6609 Other obesity due to excess calories: Secondary | ICD-10-CM

## 2020-04-07 DIAGNOSIS — E1122 Type 2 diabetes mellitus with diabetic chronic kidney disease: Secondary | ICD-10-CM

## 2020-04-07 LAB — BASIC METABOLIC PANEL
Anion gap: 10 (ref 5–15)
BUN: 31 mg/dL — ABNORMAL HIGH (ref 6–20)
CO2: 27 mmol/L (ref 22–32)
Calcium: 8.2 mg/dL — ABNORMAL LOW (ref 8.9–10.3)
Chloride: 104 mmol/L (ref 98–111)
Creatinine, Ser: 1.66 mg/dL — ABNORMAL HIGH (ref 0.61–1.24)
GFR calc Af Amer: 53 mL/min — ABNORMAL LOW (ref >60)
GFR calc non Af Amer: 46 mL/min — ABNORMAL LOW (ref >60)
Glucose, Bld: 124 mg/dL — ABNORMAL HIGH (ref 70–99)
Potassium: 3.5 mmol/L (ref 3.5–5.1)
Sodium: 141 mmol/L (ref 135–145)

## 2020-04-07 LAB — GLUCOSE, CAPILLARY
Glucose-Capillary: 117 mg/dL — ABNORMAL HIGH (ref 70–99)
Glucose-Capillary: 191 mg/dL — ABNORMAL HIGH (ref 70–99)
Glucose-Capillary: 180 mg/dL — ABNORMAL HIGH (ref 70–99)
Glucose-Capillary: 202 mg/dL — ABNORMAL HIGH (ref 70–99)

## 2020-04-07 LAB — LIPID PANEL
Cholesterol: 161 mg/dL (ref 0–200)
HDL: 58 mg/dL (ref >40)
LDL Cholesterol: 87 mg/dL (ref 0–99)
Total CHOL/HDL Ratio: 2.8 RATIO
Triglycerides: 80 mg/dL (ref <150)
VLDL: 16 mg/dL (ref 0–40)

## 2020-04-07 LAB — CBC
HCT: 34 % — ABNORMAL LOW (ref 39.0–52.0)
Hemoglobin: 10.8 g/dL — ABNORMAL LOW (ref 13.0–17.0)
MCH: 26.5 pg (ref 26.0–34.0)
MCHC: 31.8 g/dL (ref 30.0–36.0)
MCV: 83.5 fL (ref 80.0–100.0)
Platelets: 269 10*3/uL (ref 150–400)
RBC: 4.07 MIL/uL — ABNORMAL LOW (ref 4.22–5.81)
RDW: 13.9 % (ref 11.5–15.5)
WBC: 5.2 10*3/uL (ref 4.0–10.5)
nRBC: 0 % (ref 0.0–0.2)

## 2020-04-07 MED ORDER — HYDRALAZINE HCL 25 MG PO TABS
50.0000 mg | ORAL_TABLET | Freq: Three times a day (TID) | ORAL | Status: DC
  Administered 2020-04-07 (×3): 50 mg via ORAL
  Filled 2020-04-07 (×4): qty 2

## 2020-04-07 MED ORDER — ISOSORB DINITRATE-HYDRALAZINE 20-37.5 MG PO TABS
2.0000 | ORAL_TABLET | Freq: Two times a day (BID) | ORAL | Status: DC
  Filled 2020-04-07 (×5): qty 2

## 2020-04-07 NOTE — Progress Notes (Signed)
PROGRESS NOTE    [name]  BPZ:[id number] DOB: 10-Oct-1965 DOA: 04/05/2020 PCP: [name], MD    Chief Complaint  Patient presents with  . Shortness of Breath    Brief Narrative:  [name]  is a 54 y.o. male, with history of hypertension, diabetes mellitus type 2, presented to ED with complaints of 1 month history of bilateral lower extremity swelling and shortness of breath on exertion. Patient states that he has hypertension and has been compliant with taking his medications. He was seen in the ED on 03/03/2020 and was prescribed cefdinir for pneumonia. Patient said that he has noted increasing swelling of lower extremities and has gained weight.  In the ED, patient was found to be in hypertensive urgency, BNP elevated at 311. Creatinine was elevated 1.67, last creatinine from 2012 was 0.83. Patient was given Lasix 40 mg IV x1.   Assessment & Plan:   Active Problems:   Bilateral leg edema  1. Acute hypertensive diastolic CHF.  -2D echocardiogram demonstrating hypertensive HF, grade 2 diastolic CHF -continue Lasix 40mg  IV twice daily. -follow daily weight and strict I's and O's -low sodium diet emphasized  -continue BP control and follow cardiology service rec's.   2. Diabetes mellitus type 2: with nephropathy -Patient admits to being noncompliant with his diabetic diet in the past but now trying to be more careful.  -A1c 9.5.   -continue holding metformin while inpatient -continue SSI and continue modified carb diet -CBG's < 200 currently  3.  Uncontrolled/accelerated hypertension -blood pressure continue to be elevated and not at goal. -Continue the use of hydralazine 50 mg 3 times daily, 10 mg of Norvasc and IV diuretics.   -Due to increasing creatinine level we will continue holding the use of ACE inhibitors or ARB.    4. Hyperlipidemia -continue Pravachol.  -Heart healthy diet has been encouraged.  5.  Obstructive sleep apnea-patient says he was  diagnosed with obstructive sleep apnea in the past and was recommended to use CPAP.  He says he used to CPAP briefly but felt uncomfortable with the CPAP therefore he stopped using it. -Discussed with the patient about the risks of untreated sleep apnea -Patient will require repeat a sleep study after discharge and resumption of CPAP management.  6.  Class II obesity -Low calorie diet, portion control and increase physical activity discussed with patient. -Body mass index is 38.43 kg/m.    7.  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 Disposition:   Status is: Inpatient  Remains inpatient appropriate because:Patient with volume overload, CHF exacerbation, uncontrolled hypertension requiring IV diuresis.   Dispo: The patient is from: Home              Anticipated d/c is to: Home              Anticipated d/c date is: 2 days              Patient currently is no medically stable for discharge; still with signs of fluid overload and feeling short of breath\short winded with activity.  Actively receiving IV Lasix and further adjustment on antihypertensive agents.  Continue to closely follow electrolytes and renal function.     Consultants:   Cardiology  Procedures:  None  Antimicrobials:   None    Subjective: Currently  no requiring oxygen supplementation; continue to have elevated blood pressure (even improved); denies chest pain, no nausea, no vomiting.  Still with signs of fluid overload  Objective: Vitals:   04/07/20 0850 04/07/20 1011 04/07/20 1546 04/07/20 1640  BP: (!) 183/111 (!) 166/93 (!) 169/101 (!) 165/96  Pulse:   95   Resp:   17   Temp:   98.8 F (37.1 C)   TempSrc:      SpO2:   91%   Weight:      Height:        Intake/Output  Summary (Last 24 hours) at 04/07/2020 1659 Last data filed at 04/07/2020 0440 Gross per 24 hour  Intake 240 ml  Output 1250 ml  Net -1010 ml   Filed Weights   04/05/20 1315 04/06/20 1443 04/07/20 0448  Weight: 122.5 kg 125.1 kg 125 kg    Examination:  General exam: Alert, awake, oriented x 3, denies chest pain, no nausea, no vomiting. Respiratory system: Good air movement bilaterally, decreased breath sounds at the bases; no frank crackles, no wheezing, no using accessory muscles. Cardiovascular system:RRR. No murmurs, rubs or gallops.  2+ lower extremity edema appreciated bilaterally. Gastrointestinal system: Abdomen is nondistended, soft and nontender. No organomegaly or masses felt. Normal bowel sounds heard. Central nervous system: Alert and oriented. No focal neurological deficits. Extremities: No cyanosis or clubbing.  Muscle strength 5 out of 5 bilaterally. Skin: No rashes, lesions or ulcers. Psychiatry: Judgement and insight appear normal. Mood & affect appropriate.    Data Reviewed: I have personally reviewed following labs and imaging studies  CBC: Recent Labs  Lab 04/05/20 1350 04/06/20 0249 04/07/20 0602  WBC 5.1 4.8 5.2  NEUTROABS 2.9  --   --   HGB 10.8* 10.5* 10.8*  HCT 33.4* 32.3* 34.0*  MCV 83.9 83.9 83.5  PLT 242 252 810    Basic Metabolic Panel: Recent Labs  Lab 04/05/20 1350 04/06/20 0847 04/07/20 0602  NA 140 142 141  K 3.5 3.6 3.5  CL 106 106 104  CO2 26 28 27   GLUCOSE 192* 138* 124*  BUN 33* 30* 31*  CREATININE 1.67* 1.58* 1.66*  CALCIUM 8.1* 8.2* 8.2*    GFR: Estimated Creatinine Clearance: 67.7 mL/min (A) (by C-G formula based on SCr of 1.66 mg/dL (H)).  Liver Function Tests: Recent Labs  Lab 04/05/20 1350  AST 32  ALT 35  ALKPHOS 94  BILITOT 0.4  PROT 6.0*  ALBUMIN 3.0*    CBG: Recent Labs  Lab 04/06/20 1652 04/06/20 2142 04/07/20 0743 04/07/20 1125 04/07/20 1614  GLUCAP 163* 164* 117* 191* 180*     Recent  Results (from the past 240 hour(s))  SARS Coronavirus 2 by RT PCR (hospital order, performed in [facility] hospital lab) Nasopharyngeal Nasopharyngeal Swab     Status: None   Collection Time: 04/05/20  1:53 PM   Specimen: Nasopharyngeal Swab  Result Value Ref Range Status   SARS Coronavirus 2 NEGATIVE NEGATIVE Final    Comment: (NOTE) SARS-CoV-2 target nucleic acids are NOT DETECTED.  The SARS-CoV-2 RNA is generally detectable in upper and lower respiratory specimens during the acute phase of infection. The lowest concentration of SARS-CoV-2 viral copies this assay can detect is 250 copies / mL. A negative result does not preclude SARS-CoV-2 infection and should not be used as the sole basis for treatment or other patient management decisions.  A negative result may occur with improper specimen collection / handling, submission of specimen other than nasopharyngeal swab,  presence of viral mutation(s) within the areas targeted by this assay, and inadequate number of viral copies (<250 copies / mL). A negative result must be combined with clinical observations, patient history, and epidemiological information.  Fact Sheet for Patients:   StrictlyIdeas.no  Fact Sheet for Healthcare Providers: BankingDealers.co.za  This test is not yet approved or  cleared by the [place] FDA and has been authorized for detection and/or diagnosis of SARS-CoV-2 by FDA under an Emergency Use Authorization (EUA).  This EUA will remain in effect (meaning this test can be used) for the duration of the COVID-19 declaration under Section 564(b)(1) of the Act, 21 U.S.C. section 360bbb-3(b)(1), unless the authorization is terminated or revoked sooner.  Performed at [facility], [address], [place], [place] [address]      Radiology Studies: ECHOCARDIOGRAM COMPLETE  Result Date: 04/06/2020    ECHOCARDIOGRAM REPORT   Patient Name:   [name]  Date of Exam: 04/06/2020 Medical Rec #:  [id number]       Height:       71.0 in Accession #:    [id number]      Weight:       270.0 lb Date of Birth:  [birth year]       BSA:          2.395 m Patient Age:    67 years        BP:           182/105 mmHg Patient Gender: M               HR:           80 bpm. Exam Location:  [facility] Procedure: 2D Echo Indications:    CHF-Acute Diastolic 568.12 / X51.70  History:        Patient has no prior history of Echocardiogram examinations.                 Risk Factors:Diabetes, Non-Smoker and Hypertension. Bilateral                 Edema.  Sonographer:    Leavy Cella RDCS (AE) Referring Phys: [place]  1. Left ventricular ejection fraction, by estimation, is 50%. The left ventricle has normal function. The left ventricle has no regional wall motion abnormalities. There is severe left ventricular hypertrophy. Left ventricular diastolic parameters are consistent with Grade II diastolic dysfunction (pseudonormalization). Elevated left atrial pressure.  2. Right ventricular systolic function is normal. The right ventricular size is normal.  3. Left atrial size was mildly dilated.  4. Large pleural effusion in the left lateral region.  5. The mitral valve is normal in structure. Trivial mitral valve regurgitation. No evidence of mitral stenosis.  6. The aortic valve is tricuspid. Aortic valve regurgitation is not visualized. No aortic stenosis is present.  7. The inferior vena cava is normal in size with greater than 50% respiratory variability, suggesting right atrial pressure of 3 mmHg. FINDINGS  Left Ventricle: Left ventricular ejection fraction, by estimation, is 50%. The left ventricle has normal function. The left ventricle has no regional wall motion abnormalities. The left ventricular internal cavity size was normal in size. There is severe left ventricular hypertrophy. Left ventricular diastolic parameters are consistent with Grade II diastolic  dysfunction (pseudonormalization). Elevated left atrial pressure. Right Ventricle: The right ventricular size is normal. No increase in right ventricular wall thickness. Right ventricular systolic function is normal. Left Atrium: Left atrial size was mildly  dilated. Right Atrium: Right atrial size was normal in size. Pericardium: There is no evidence of pericardial effusion. Mitral Valve: The mitral valve is normal in structure. Trivial mitral valve regurgitation. No evidence of mitral valve stenosis. Tricuspid Valve: The tricuspid valve is normal in structure. Tricuspid valve regurgitation is not demonstrated. No evidence of tricuspid stenosis. Aortic Valve: The aortic valve is tricuspid. Aortic valve regurgitation is not visualized. No aortic stenosis is present. Aortic valve mean gradient measures 2.0 mmHg. Aortic valve peak gradient measures 3.9 mmHg. Aortic valve area, by VTI measures 2.73 cm. Pulmonic Valve: The pulmonic valve was not well visualized. Pulmonic valve regurgitation is not visualized. No evidence of pulmonic stenosis. Aorta: The aortic root is normal in size and structure. Pulmonary Artery: Indeterminant PASP, inadequate TR jet. Venous: The inferior vena cava is normal in size with greater than 50% respiratory variability, suggesting right atrial pressure of 3 mmHg. IAS/Shunts: No atrial level shunt detected by color flow Doppler. Additional Comments: There is a large pleural effusion in the left lateral region.  LEFT VENTRICLE PLAX 2D LVIDd:         5.09 cm  Diastology LVIDs:         3.99 cm  LV e' medial:    7.29 cm/s LV PW:         1.50 cm  LV E/e' medial:  13.0 LV IVS:        1.42 cm  LV e' lateral:   6.42 cm/s LVOT diam:     2.10 cm  LV E/e' lateral: 14.8 LV SV:         52 LV SV Index:   22 LVOT Area:     3.46 cm  RIGHT VENTRICLE RV S prime:     11.00 cm/s TAPSE (M-mode): 2.5 cm LEFT ATRIUM           Index       RIGHT ATRIUM           Index LA diam:      4.50 cm 1.88 cm/m  RA Area:      15.00 cm LA Vol (A2C): 65.3 ml 27.26 ml/m RA Volume:   40.70 ml  16.99 ml/m LA Vol (A4C): 66.0 ml 27.55 ml/m  AORTIC VALVE AV Area (Vmax):    2.66 cm AV Area (Vmean):   2.51 cm AV Area (VTI):     2.73 cm AV Vmax:           98.82 cm/s AV Vmean:          67.098 cm/s AV VTI:            0.191 m AV Peak Grad:      3.9 mmHg AV Mean Grad:      2.0 mmHg LVOT Vmax:         75.84 cm/s LVOT Vmean:        48.631 cm/s LVOT VTI:          0.151 m LVOT/AV VTI ratio: 0.79  AORTA Ao Root diam: 2.80 cm MITRAL VALVE MV Area (PHT): 3.53 cm    SHUNTS MV Decel Time: 215 msec    Systemic VTI:  0.15 m MR Peak grad: 25.2 mmHg    Systemic Diam: 2.10 cm MR Vmax:      251.00 cm/s MV E velocity: 95.10 cm/s MV A velocity: 20.10 cm/s MV E/A ratio:  4.73 Carlyle Dolly MD Electronically signed by Carlyle Dolly MD Signature Date/Time: 04/06/2020/11:04:27 AM    Final  Scheduled Meds: . amLODipine  10 mg Oral Daily  . enoxaparin (LOVENOX) injection  40 mg Subcutaneous Q12H  . furosemide  40 mg Intravenous BID  . hydrALAZINE  50 mg Oral TID  . insulin aspart  0-9 Units Subcutaneous TID WC  . pravastatin  40 mg Oral Daily  . sodium chloride flush  3 mL Intravenous Q12H   Continuous Infusions: . sodium chloride       LOS: 2 days    Barton Dubois MD [facility]   To contact the attending provider between 7A-7P or the covering provider during after hours 7P-7A, please log into the web site [email or web address] and access using universal [place] password for that web site. If you do not have the password, please call the hospital operator.  04/07/2020, 4:59 PM

## 2020-04-07 NOTE — Progress Notes (Addendum)
Progress Note  Patient Name: [name] Date of Encounter: 04/07/2020  Primary Cardiologist: Carlyle Dolly, MD   Subjective   Orthopnea and dyspnea improved. No chest pain or palpitations. Says he urinated multiple times in the commode yesterday due to his urinal being full and output was unable to be recorded.    Inpatient Medications    Scheduled Meds: . amLODipine  10 mg Oral Daily  . enoxaparin (LOVENOX) injection  40 mg Subcutaneous Q12H  . furosemide  40 mg Intravenous BID  . insulin aspart  0-9 Units Subcutaneous TID WC  . isosorbide-hydrALAZINE  2 tablet Oral BID  . pravastatin  40 mg Oral Daily  . sodium chloride flush  3 mL Intravenous Q12H   Continuous Infusions: . sodium chloride     PRN Meds: sodium chloride, hydrALAZINE, ondansetron **OR** ondansetron (ZOFRAN) IV, sodium chloride flush   Vital Signs    Vitals:   04/06/20 1443 04/06/20 2333 04/07/20 0445 04/07/20 0448  BP: (!) 178/101 (!) 162/89 (!) 183/100   Pulse: 88 92 88   Resp: 18 18    Temp:  98.5 F (36.9 C) 98.5 F (36.9 C)   TempSrc:   Oral   SpO2: 97% 95% 97%   Weight: 125.1 kg   125 kg  Height: 5\' 11"  (1.803 m)       Intake/Output Summary (Last 24 hours) at 04/07/2020 0837 Last data filed at 04/07/2020 0440 Gross per 24 hour  Intake 240 ml  Output 1250 ml  Net -1010 ml    Last 3 Weights 04/07/2020 04/06/2020 04/05/2020  Weight (lbs) 275 lb 9.2 oz 275 lb 12.7 oz 270 lb  Weight (kg) 125 kg 125.1 kg 122.471 kg      Telemetry    NSR, HR in 80's to 90's. No significant arrhythmias.  - Personally Reviewed  ECG    No new tracings.   Physical Exam   General: Well developed, well nourished, male appearing in no acute distress. Head: Normocephalic, atraumatic.  Neck: Supple without bruits, JVD at 8 cm. Lungs:  Resp regular and unlabored, mild rales along left base. Heart: RRR, S1, S2, no S3, S4, or murmur; no rub. Abdomen: Soft, non-tender, non-distended with normoactive  bowel sounds. No hepatomegaly. No rebound/guarding. No obvious abdominal masses. Extremities: No clubbing or cyanosis, 2+ pitting edema bilaterally. Distal pedal pulses are 2+ bilaterally. Neuro: Alert and oriented X 3. Moves all extremities spontaneously. Psych: Normal affect.  Labs    Chemistry Recent Labs  Lab 04/05/20 1350 04/06/20 0847 04/07/20 0602  NA 140 142 141  K 3.5 3.6 3.5  CL 106 106 104  CO2 26 28 27   GLUCOSE 192* 138* 124*  BUN 33* 30* 31*  CREATININE 1.67* 1.58* 1.66*  CALCIUM 8.1* 8.2* 8.2*  PROT 6.0*  --   --   ALBUMIN 3.0*  --   --   AST 32  --   --   ALT 35  --   --   ALKPHOS 94  --   --   BILITOT 0.4  --   --   GFRNONAA 45* 49* 46*  GFRAA 53* 56* 53*  ANIONGAP 8 8 10      Hematology Recent Labs  Lab 04/05/20 1350 04/06/20 0249 04/07/20 0602  WBC 5.1 4.8 5.2  RBC 3.98* 3.85* 4.07*  HGB 10.8* 10.5* 10.8*  HCT 33.4* 32.3* 34.0*  MCV 83.9 83.9 83.5  MCH 27.1 27.3 26.5  MCHC 32.3 32.5 31.8  RDW 14.0 14.2 13.9  PLT 242 252 269    Cardiac EnzymesNo results for input(s): TROPONINI in the last 168 hours. No results for input(s): TROPIPOC in the last 168 hours.   BNP Recent Labs  Lab 04/05/20 1350  BNP 311.0*     DDimer No results for input(s): DDIMER in the last 168 hours.   Radiology    DG Chest Portable 1 View  Result Date: 04/05/2020 CLINICAL DATA:  Shortness of breath and lower extremity edema EXAM: PORTABLE CHEST 1 VIEW COMPARISON:  March 03, 2020 FINDINGS: There is a small left pleural effusion. There is mild left base atelectasis. Lungs elsewhere are clear. Heart is borderline enlarged with pulmonary vascularity normal. No adenopathy. No bone lesions. IMPRESSION: Small left pleural effusion with mild left base atelectasis. Lungs elsewhere clear. Stable cardiac prominence. No adenopathy appreciable. Electronically Signed   By: Lowella Grip III M.D.   On: 04/05/2020 13:40   Cardiac Studies   Echocardiogram:  04/06/2020 IMPRESSIONS    1. Left ventricular ejection fraction, by estimation, is 50%. The left  ventricle has normal function. The left ventricle has no regional wall  motion abnormalities. There is severe left ventricular hypertrophy. Left  ventricular diastolic parameters are  consistent with Grade II diastolic dysfunction (pseudonormalization).  Elevated left atrial pressure.  2. Right ventricular systolic function is normal. The right ventricular  size is normal.  3. Left atrial size was mildly dilated.  4. Large pleural effusion in the left lateral region.  5. The mitral valve is normal in structure. Trivial mitral valve  regurgitation. No evidence of mitral stenosis.  6. The aortic valve is tricuspid. Aortic valve regurgitation is not  visualized. No aortic stenosis is present.  7. The inferior vena cava is normal in size with greater than 50%  respiratory variability, suggesting right atrial pressure of 3 mmHg.   Patient Profile     54 y.o. male w/ PMH of HTN, HLD, and Type 2 DM who presented with a 1 month history of progressive dyspnea and lower extremity edema. Admitted for an acute CHF exacerbation.   Assessment & Plan    1. Acute Diastolic CHF Exacerbation - BNP elevated to 311 on admission and CXR consistent with CHF. Echocardiogram shows a low-normal EF of 50% with no regional WMA. Noted to have severe LVH and Grade 2 DD. - He has been receiving IV Lasix 40mg  BID with a recorded output of -2.4L thus far but multiple urine occurrences have not been recorded as discussed above. Weight listed as 275 lbs (unchanged from admission) and he reports a baseline of 250 - 260 lbs. Continue with IV Lasix today. Repeat BMET in AM and if stable renal function, would further titrate Lasix. Reviewed the importance of limiting sodium intake with the patient. 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    2. Accelerated HTN - BP has remained elevated at 144/83 - 189/106 within the past 24 hours. Currently on Amlodipine 10mg  daily and Hydralazine 25mg  TID. He did not receive his first dose of Hydralazine until this morning by review of the MAR. Would follow BP today with initiation of this and can titrate to 50mg  TID tomorrow if BP remains above goal. PTA ACE-I held given AKI.   3. HLD - FLP shows total cholesterol of 161, HDL 58 and LDL 87. He has been continued on PTA Pravastatin 40mg  daily.    4. AKI - Previous creatinine of  0.83 eight years ago but no recent labs for comparison. Creatinine at 1.67 on admission, at 1.66 today.   5. Nocturnal Desaturations - Prior sleep study in 2014 showed OSA but he does not currently use a CPAP. Would recommend an outpatient sleep study.   6. Type 2 DM - Hgb A1c elevated to 9.5 this admission. Will need close follow-up with his PCP as an outpatient for additional medication adjustments.    For questions or updates, please contact [place] Please consult [email or web address] for contact info under Cardiology/STEMI.   Arna Medici , PA-C 8:37 AM 04/07/2020 Pager: [phone]  Attending note Patient seen and discussed with PA Ahmed Prima, I agree with her documentation. Admitted with acute diastolic HF. Echo with normal LVEF, grade II diastolic dysfunction, severe LVH. Severe HTN on admission, I suspect poorly controlled for some time leading to LVH and subsequent diastolic dysfunciton. I/Os incomplete, urinated frequently in ER yesterday but not documented. He is on IV lasix 40mg  bid, mild variations in renal function, continue IV lasix  Severe HTN on admisson. Currently he is on norvac 10mg ,. Given his normal LVEF and cost of bidil would favor using just hydralazine for him. Would use hydralazine 50mg  tid. May have some improvement with diuresis in bp as well. Room to titrate hydral further, other options would include labetalol or  aldactone. With renal function avoiding ACE/ARB. Unknown baseline renal function, I suspect with his HTN and signs of cardiac end organ damage may also have hypertensive renal disease and this may be chronic dysfunction, follow trends.   Carlyle Dolly MD

## 2020-04-08 LAB — GLUCOSE, CAPILLARY
Glucose-Capillary: 165 mg/dL — ABNORMAL HIGH (ref 70–99)
Glucose-Capillary: 214 mg/dL — ABNORMAL HIGH (ref 70–99)
Glucose-Capillary: 158 mg/dL — ABNORMAL HIGH (ref 70–99)

## 2020-04-08 LAB — BASIC METABOLIC PANEL
Anion gap: 8 (ref 5–15)
BUN: 37 mg/dL — ABNORMAL HIGH (ref 6–20)
CO2: 29 mmol/L (ref 22–32)
Calcium: 8.4 mg/dL — ABNORMAL LOW (ref 8.9–10.3)
Chloride: 103 mmol/L (ref 98–111)
Creatinine, Ser: 1.96 mg/dL — ABNORMAL HIGH (ref 0.61–1.24)
GFR calc Af Amer: 43 mL/min — ABNORMAL LOW (ref >60)
GFR calc non Af Amer: 37 mL/min — ABNORMAL LOW (ref >60)
Glucose, Bld: 172 mg/dL — ABNORMAL HIGH (ref 70–99)
Potassium: 4 mmol/L (ref 3.5–5.1)
Sodium: 140 mmol/L (ref 135–145)

## 2020-04-08 LAB — MAGNESIUM: Magnesium: 1.7 mg/dL (ref 1.7–2.4)

## 2020-04-08 MED ORDER — HYDRALAZINE HCL 25 MG PO TABS
75.0000 mg | ORAL_TABLET | Freq: Three times a day (TID) | ORAL | Status: DC
  Administered 2020-04-08 (×3): 75 mg via ORAL
  Filled 2020-04-08 (×3): qty 3

## 2020-04-08 NOTE — Progress Notes (Addendum)
SATURATION QUALIFICATIONS: (This note is used to comply with regulatory documentation for home oxygen)  Patient Saturations on Room Air at Rest = 93%  Patient Saturations on Room Air while Ambulating = 87% 

## 2020-04-08 NOTE — Progress Notes (Signed)
Inpatient Diabetes Program Recommendations  AACE/ADA: New Consensus Statement on Inpatient Glycemic Control (2015)  Target Ranges:  Prepandial:   less than 140 mg/dL      Peak postprandial:   less than 180 mg/dL (1-2 hours)      Critically ill patients:  140 - 180 mg/dL   Lab Results  Component Value Date   GLUCAP 214 (H) 04/08/2020   HGBA1C 9.5 (H) 04/06/2020    Review of Glycemic Control Results for MCCLELLAN, DEMARAIS (MRN [id number]) as of 04/08/2020 14:12  Ref. Range 04/07/2020 21:36 04/08/2020 08:07 04/08/2020 11:28  Glucose-Capillary Latest Ref Range: 70 - 99 mg/dL 202 (H) 165 (H) 214 (H)   Diabetes history: DM 2 Outpatient Diabetes medications:  Amaryl 4 mg bid, Metformin 500 mg bid Current orders for Inpatient glycemic control:  Novolog sensitive tid with meals Inpatient Diabetes Program Recommendations:   May consider adding Lantus 10 units daily.  Thanks,  Adah Perl, RN, BC-ADM Inpatient Diabetes Coordinator Pager [phone] (8a-5p)

## 2020-04-08 NOTE — Progress Notes (Addendum)
Progress Note  Patient Name: [name] Date of Encounter: 04/08/2020  Primary Cardiologist: Carlyle Dolly, MD   Subjective   Breathing improved but he did experience orthopnea overnight. No chest pain or palpitations.   Inpatient Medications    Scheduled Meds: . amLODipine  10 mg Oral Daily  . enoxaparin (LOVENOX) injection  40 mg Subcutaneous Q12H  . furosemide  40 mg Intravenous BID  . hydrALAZINE  50 mg Oral TID  . insulin aspart  0-9 Units Subcutaneous TID WC  . pravastatin  40 mg Oral Daily  . sodium chloride flush  3 mL Intravenous Q12H   Continuous Infusions: . sodium chloride     PRN Meds: sodium chloride, hydrALAZINE, ondansetron **OR** ondansetron (ZOFRAN) IV, sodium chloride flush   Vital Signs    Vitals:   04/07/20 1938 04/07/20 2136 04/08/20 0600 04/08/20 0616  BP:  (!) 157/88  (!) 170/98  Pulse:  95  93  Resp:    16  Temp:  98.6 F (37 C)  98.2 F (36.8 C)  TempSrc:      SpO2: 94% 90%  94%  Weight:   122.6 kg   Height:       No intake or output data in the 24 hours ending 04/08/20 0752  Last 3 Weights 04/08/2020 04/07/2020 04/06/2020  Weight (lbs) 270 lb 4.5 oz 275 lb 9.2 oz 275 lb 12.7 oz  Weight (kg) 122.6 kg 125 kg 125.1 kg      Telemetry    NSR, HR in 80's to 90's. No significant arrhythmias.  - Personally Reviewed  ECG    No new tracings.   Physical Exam   General: Well developed, well nourished, male appearing in no acute distress. Head: Normocephalic, atraumatic.  Neck: Supple without bruits, JVD at 8 cm. Lungs:  Resp regular and unlabored, mild rales along bases. Heart: RRR, S1, S2, no S3, S4, or murmur; no rub. Abdomen: Soft, non-tender, non-distended with normoactive bowel sounds. No hepatomegaly. No rebound/guarding. No obvious abdominal masses. Extremities: No clubbing or cyanosis. 2+ pitting edema bilaterally. Distal pedal pulses are 2+ bilaterally. Neuro: Alert and oriented X 3. Moves all extremities  spontaneously. Psych: Normal affect.  Labs    Chemistry Recent Labs  Lab 04/05/20 1350 04/06/20 0847 04/07/20 0602  NA 140 142 141  K 3.5 3.6 3.5  CL 106 106 104  CO2 26 28 27   GLUCOSE 192* 138* 124*  BUN 33* 30* 31*  CREATININE 1.67* 1.58* 1.66*  CALCIUM 8.1* 8.2* 8.2*  PROT 6.0*  --   --   ALBUMIN 3.0*  --   --   AST 32  --   --   ALT 35  --   --   ALKPHOS 94  --   --   BILITOT 0.4  --   --   GFRNONAA 45* 49* 46*  GFRAA 53* 56* 53*  ANIONGAP 8 8 10      Hematology Recent Labs  Lab 04/05/20 1350 04/06/20 0249 04/07/20 0602  WBC 5.1 4.8 5.2  RBC 3.98* 3.85* 4.07*  HGB 10.8* 10.5* 10.8*  HCT 33.4* 32.3* 34.0*  MCV 83.9 83.9 83.5  MCH 27.1 27.3 26.5  MCHC 32.3 32.5 31.8  RDW 14.0 14.2 13.9  PLT 242 252 269    Cardiac EnzymesNo results for input(s): TROPONINI in the last 168 hours. No results for input(s): TROPIPOC in the last 168 hours.   BNP Recent Labs  Lab 04/05/20 1350  BNP 311.0*  DDimer No results for input(s): DDIMER in the last 168 hours.   Radiology   No new imaging.   Cardiac Studies   Echocardiogram: 03/2020 IMPRESSIONS    1. Left ventricular ejection fraction, by estimation, is 50%. The left  ventricle has normal function. The left ventricle has no regional wall  motion abnormalities. There is severe left ventricular hypertrophy. Left  ventricular diastolic parameters are  consistent with Grade II diastolic dysfunction (pseudonormalization).  Elevated left atrial pressure.  2. Right ventricular systolic function is normal. The right ventricular  size is normal.  3. Left atrial size was mildly dilated.  4. Large pleural effusion in the left lateral region.  5. The mitral valve is normal in structure. Trivial mitral valve  regurgitation. No evidence of mitral stenosis.  6. The aortic valve is tricuspid. Aortic valve regurgitation is not  visualized. No aortic stenosis is present.  7. The inferior vena cava is normal in  size with greater than 50%  respiratory variability, suggesting right atrial pressure of 3 mmHg.   Patient Profile     54 y.o. male w/ PMH of HTN, HLD, and Type 2 DMwho presented with a 1 month history of progressive dyspnea and lower extremity edema. Admitted for an acute CHF exacerbation.   Assessment & Plan    1. Acute Diastolic CHF Exacerbation -BNPelevated CH885 on admission and CXR consistent with CHF. Echocardiogram shows a low-normal EF of 50% with no regional WMA. Noted to have severe LVH and Grade 2 DD. - Currently receiving IV Lasix 40mg  BID. I&O's not recorded but weight has declined by 5 lbs (275 --> 270 lbs). Reports a previous baseline of 250-260 lbs. Will order a repeat BMET and Mg. If renal function stable, would recommend further titration of IV Lasix to 60mg  BID given his volume status. Sodium and fluid restriction reviewed.   2. Accelerated HTN - BP has improved but still elevated at 157/88 - 183/111 within the past 24 hours, at 170/98 on most recent check. Currently receiving Amlodipine 10mg  daily and Hydralazine 50mg  TID. Will titrate Hydralazine to 75mg  TID. Would consider the addition of Coreg or Spironolactone if BP remains above goal.   3. HLD - Lipid panel this admission shows total cholesterol 161, HDL 58, Triglycerides 80 and LDL 87. Remains on Pravastatin 40mg  daily.    4. AKI - Previous creatinine of 0.83 eight years ago but no recent labs for comparison. Suspect a component of underlying CKD given his uncontrolled HTN. Creatinine was at 1.66 on 6/15 which is similar to values on admission. Will order a repeat BMET for this AM.   5. Nocturnal Desaturations - He did have a sleep study in 2014 which demonstrated OSA but was intolerant to CPAP at that time. Would benefit from a repeat sleep study as an outpatient.   6. Type 2 DM - Hgb A1c at 9.5 this admission. Further management per admitting team.    For questions or updates, please contact [place] Please consult [email or web address] for contact info under Cardiology/STEMI.   Arna Medici , PA-C 7:52 AM 04/08/2020 Pager: [phone]   Patient seen and discussed with PA Ahmed Prima, I agree with her documentation. Admitted with acute diatolic HF in setting of uncontrolled HTN. I/Os data is incomplete, weights would suggest 5 lbs weight loss. He is on IV lasix 40mg  bid, labs are pending today. Symptoms and edema have been improving on this regimen, but remains fluid overloaded, continue IV diuresis, f/u labs. Agree with stable  renal function can increase lasix to 60mg  bid.   Admitted with severe HTN SBPs in the 200s. Medication options limited due to renal dysfunction. On norvasc 10mg , hydral 50mg  tid. BP's remain hypertensive, increase hydralazine to 75mg  tid. Other options would include labetalol and/or spironolactone, diuresis should also help.    Carlyle Dolly MD

## 2020-04-08 NOTE — Progress Notes (Signed)
PROGRESS NOTE    [name]  KGM:[id number] DOB: 03/02/1966 DOA: 04/05/2020 PCP: [name], MD    Chief Complaint  Patient presents with  . Shortness of Breath    Brief Narrative:  [name]  is a 54 y.o. male, with history of hypertension, diabetes mellitus type 2, presented to ED with complaints of 1 month history of bilateral lower extremity swelling and shortness of breath on exertion. Patient states that he has hypertension and has been compliant with taking his medications. He was seen in the ED on 03/03/2020 and was prescribed cefdinir for pneumonia. Patient said that he has noted increasing swelling of lower extremities and has gained weight.  In the ED, patient was found to be in hypertensive urgency, BNP elevated at 311. Creatinine was elevated 1.67, last creatinine from 2012 was 0.83. Patient was given Lasix 40 mg IV x1.   Assessment & Plan:   Active Problems:   Bilateral leg edema  1. Acute hypertensive diastolic CHF.  -2D echocardiogram demonstrating hypertensive HF, grade 2 diastolic CHF -continue Lasix 40mg  IV twice daily. -follow daily weight and strict I's and O's -low sodium diet emphasized  -continue BP control and follow cardiology service rec's.   2. Diabetes mellitus type 2: with nephropathy -Patient admits to being noncompliant with his diabetic diet in the past but now trying to be more careful.  -A1c 9.5.   -continue holding metformin while inpatient -continue SSI and continue modified carb diet -CBG's < 200 currently  3.  Uncontrolled/accelerated hypertension -blood pressure continue to be elevated and not at goal. -Continue the use of hydralazine now 75 mg 3 times daily, 10 mg of Norvasc and IV diuretics.   -Due to increasing creatinine level we will continue holding the use of ACE inhibitors or ARB.    4. Hyperlipidemia -continue Pravachol.  -Heart healthy diet has been encouraged.  5.  Obstructive sleep apnea-patient says he  was diagnosed with obstructive sleep apnea in the past and was recommended to use CPAP.  He says he used to CPAP briefly but felt uncomfortable with the CPAP therefore he stopped using it. -Discussed with the patient about the risks of untreated sleep apnea -Patient will require repeat a sleep study after discharge and resumption of CPAP management.  6.  Class II obesity -Low calorie diet, portion control and increase physical activity discussed with patient. -Body mass index is 37.7 kg/m.    7.  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  DVT prophylaxis: Subcutaneous Lovenox Code Status: Full code Family Communication: Patient awake and oriented, no family at the bedside. Disposition:   Status is: Inpatient  Remains inpatient appropriate because:Patient with volume overload, CHF exacerbation, uncontrolled hypertension requiring IV diuresis.   Dispo: The patient is from: Home              Anticipated d/c is to: Home              Anticipated d/c date is: 2 days              Patient currently is no medically stable for discharge; still with signs of fluid overload and feeling short of breath\short winded with activity.  Actively receiving IV Lasix and further adjustment on antihypertensive agents.  Continue to closely follow electrolytes and renal function.     Consultants:   Cardiology  Procedures:  None  Antimicrobials:   None    Subjective: No CP, no nausea, no vomiting; still having orthopnea symptoms and feeling SOB with activity. BP is better today.  Objective: Vitals:   04/07/20 1938 04/07/20 2136 04/08/20 0600 04/08/20 0616  BP:  (!) 157/88  (!) 170/98  Pulse:  95  93  Resp:    16  Temp:  98.6 F (37 C)  98.2 F (36.8 C)  TempSrc:      SpO2: 94% 90%  94%  Weight:   122.6 kg   Height:        Intake/Output Summary  (Last 24 hours) at 04/08/2020 1107 Last data filed at 04/08/2020 0800 Gross per 24 hour  Intake --  Output 800 ml  Net -800 ml   Filed Weights   04/06/20 1443 04/07/20 0448 04/08/20 0600  Weight: 125.1 kg 125 kg 122.6 kg    Examination: General exam: Alert, awake, oriented x 3; denies chest pain, no nausea, no vomiting.  Still complaining of orthopnea and having some shortness of breath with activity.  Patient reports improvement in his swelling and is also having good urine output. Respiratory system: Decreased breath sounds at the bases; no wheezing, no using accessory muscles. Cardiovascular system: Rate controlled, no rubs, no gallops, no JVD on exam.  2+ lower extremity edema appreciated bilaterally. Gastrointestinal system: Abdomen is nondistended, soft and nontender. No organomegaly or masses felt. Normal bowel sounds heard. Central nervous system: Alert and oriented. No focal neurological deficits. Extremities: No cyanosis or clubbing. Skin: No rashes, no petechiae. Psychiatry: Judgement and insight appear normal. Mood & affect appropriate.     Data Reviewed: I have personally reviewed following labs and imaging studies  CBC: Recent Labs  Lab 04/05/20 1350 04/06/20 0249 04/07/20 0602  WBC 5.1 4.8 5.2  NEUTROABS 2.9  --   --   HGB 10.8* 10.5* 10.8*  HCT 33.4* 32.3* 34.0*  MCV 83.9 83.9 83.5  PLT 242 252 166    Basic Metabolic Panel: Recent Labs  Lab 04/05/20 1350 04/06/20 0847 04/07/20 0602 04/08/20 0820  NA 140 142 141 140  K 3.5 3.6 3.5 4.0  CL 106 106 104 103  CO2 26 28 27 29   GLUCOSE 192* 138* 124* 172*  BUN 33* 30* 31* 37*  CREATININE 1.67* 1.58* 1.66* 1.96*  CALCIUM 8.1* 8.2* 8.2* 8.4*  MG  --   --   --  1.7    GFR: Estimated Creatinine Clearance: 56.7 mL/min (A) (by C-G formula based on SCr of 1.96 mg/dL (H)).  Liver Function Tests: Recent Labs  Lab 04/05/20 1350  AST 32  ALT 35  ALKPHOS 94  BILITOT 0.4  PROT 6.0*  ALBUMIN 3.0*     CBG: Recent Labs  Lab 04/07/20 0743 04/07/20 1125 04/07/20 1614 04/07/20 2136 04/08/20 0807  GLUCAP 117* 191* 180* 202* 165*     Recent Results (from the past 240 hour(s))  SARS Coronavirus 2 by RT PCR (hospital order, performed in [facility] hospital lab) Nasopharyngeal Nasopharyngeal Swab     Status: None   Collection Time: 04/05/20  1:53 PM   Specimen: Nasopharyngeal Swab  Result Value Ref Range Status   SARS Coronavirus 2 NEGATIVE NEGATIVE Final    Comment: (NOTE) SARS-CoV-2 target nucleic acids are NOT DETECTED.  The SARS-CoV-2 RNA is generally detectable in upper and lower respiratory specimens during the acute phase of infection. The lowest concentration of SARS-CoV-2 viral copies this assay can detect is 250 copies / mL. A negative result  does not preclude SARS-CoV-2 infection and should not be used as the sole basis for treatment or other patient management decisions.  A negative result may occur with improper specimen collection / handling, submission of specimen other than nasopharyngeal swab, presence of viral mutation(s) within the areas targeted by this assay, and inadequate number of viral copies (<250 copies / mL). A negative result must be combined with clinical observations, patient history, and epidemiological information.  Fact Sheet for Patients:   StrictlyIdeas.no  Fact Sheet for Healthcare Providers: BankingDealers.co.za  This test is not yet approved or  cleared by the [place] FDA and has been authorized for detection and/or diagnosis of SARS-CoV-2 by FDA under an Emergency Use Authorization (EUA).  This EUA will remain in effect (meaning this test can be used) for the duration of the COVID-19 declaration under Section 564(b)(1) of the Act, 21 U.S.C. section 360bbb-3(b)(1), unless the authorization is terminated or revoked sooner.  Performed at [facility], [address]  [place], [place] [address]      Radiology Studies: No results found.  Scheduled Meds: . amLODipine  10 mg Oral Daily  . enoxaparin (LOVENOX) injection  40 mg Subcutaneous Q12H  . furosemide  40 mg Intravenous BID  . hydrALAZINE  75 mg Oral TID  . insulin aspart  0-9 Units Subcutaneous TID WC  . pravastatin  40 mg Oral Daily  . sodium chloride flush  3 mL Intravenous Q12H   Continuous Infusions: . sodium chloride       LOS: 3 days    Barton Dubois MD [facility]   To contact the attending provider between 7A-7P or the covering provider during after hours 7P-7A, please log into the web site [email or web address] and access using universal [place] password for that web site. If you do not have the password, please call the hospital operator.  04/08/2020, 11:07 AM

## 2020-04-09 ENCOUNTER — Telehealth: Payer: Self-pay | Admitting: Licensed Clinical Social Worker

## 2020-04-09 LAB — BASIC METABOLIC PANEL
Anion gap: 8 (ref 5–15)
BUN: 34 mg/dL — ABNORMAL HIGH (ref 6–20)
CO2: 30 mmol/L (ref 22–32)
Calcium: 8.1 mg/dL — ABNORMAL LOW (ref 8.9–10.3)
Chloride: 103 mmol/L (ref 98–111)
Creatinine, Ser: 1.77 mg/dL — ABNORMAL HIGH (ref 0.61–1.24)
GFR calc Af Amer: 49 mL/min — ABNORMAL LOW (ref >60)
GFR calc non Af Amer: 42 mL/min — ABNORMAL LOW (ref >60)
Glucose, Bld: 139 mg/dL — ABNORMAL HIGH (ref 70–99)
Potassium: 3.7 mmol/L (ref 3.5–5.1)
Sodium: 141 mmol/L (ref 135–145)

## 2020-04-09 LAB — GLUCOSE, CAPILLARY
Glucose-Capillary: 122 mg/dL — ABNORMAL HIGH (ref 70–99)
Glucose-Capillary: 206 mg/dL — ABNORMAL HIGH (ref 70–99)
Glucose-Capillary: 231 mg/dL — ABNORMAL HIGH (ref 70–99)
Glucose-Capillary: 257 mg/dL — ABNORMAL HIGH (ref 70–99)

## 2020-04-09 MED ORDER — LABETALOL HCL 200 MG PO TABS
100.0000 mg | ORAL_TABLET | Freq: Two times a day (BID) | ORAL | Status: DC
  Filled 2020-04-09: qty 1

## 2020-04-09 MED ORDER — LABETALOL HCL 200 MG PO TABS
200.0000 mg | ORAL_TABLET | Freq: Two times a day (BID) | ORAL | Status: DC
  Administered 2020-04-09 – 2020-04-10 (×3): 200 mg via ORAL
  Filled 2020-04-09 (×2): qty 1

## 2020-04-09 MED ORDER — HYDRALAZINE HCL 25 MG PO TABS
100.0000 mg | ORAL_TABLET | Freq: Three times a day (TID) | ORAL | Status: DC
  Administered 2020-04-09 – 2020-04-10 (×5): 100 mg via ORAL
  Filled 2020-04-09 (×5): qty 4

## 2020-04-09 NOTE — Telephone Encounter (Signed)
CSW referred to assist patient with obtaining a BP cuff. CSW contacted patient to inform cuff will be delivered to home. Patient grateful for support and assistance. CSW available as needed. Jackie Dutchess Crosland, LCSW, CCSW-MCS [phone]  

## 2020-04-09 NOTE — Plan of Care (Signed)

## 2020-04-09 NOTE — Progress Notes (Signed)
PROGRESS NOTE    [name]  ZTI:[id number] DOB: 11/07/1965 DOA: 04/05/2020 PCP: [name], MD    Chief Complaint  Patient presents with  . Shortness of Breath    Brief Narrative:  [name]  is a 54 y.o. male, with history of hypertension, diabetes mellitus type 2, presented to ED with complaints of 1 month history of bilateral lower extremity swelling and shortness of breath on exertion. Patient states that he has hypertension and has been compliant with taking his medications. He was seen in the ED on 03/03/2020 and was prescribed cefdinir for pneumonia. Patient said that he has noted increasing swelling of lower extremities and has gained weight.  In the ED, patient was found to be in hypertensive urgency, BNP elevated at 311. Creatinine was elevated 1.67, last creatinine from 2012 was 0.83. Patient was given Lasix 40 mg IV x1.   Assessment & Plan:   Active Problems:   Bilateral leg edema  1. Acute hypertensive diastolic CHF.  -2D echocardiogram demonstrating hypertensive HF, grade 2 diastolic CHF -continue Lasix 40mg  IV twice daily. -follow daily weight and strict I's and O's -low sodium diet emphasized  -continue BP control and follow cardiology service rec's.   2. Diabetes mellitus type 2: with nephropathy -Patient admits to being noncompliant with his diabetic diet in the past but now trying to be more careful.  -A1c 9.5.   -continue holding metformin while inpatient -continue SSI and continue modified carb diet -CBG's < 200 currently  3.  Uncontrolled/accelerated hypertension -blood pressure continue to be elevated and not at goal. -Continue the use of hydralazine now 100 mg 3 times daily, 10 mg of Norvasc daily, labetalol 200mg  BID and continue IV diuretics.   -Due to increasing creatinine level we will continue holding the use of ACE inhibitors or ARB.    4. Hyperlipidemia -continue Pravachol.  -Heart healthy diet has been encouraged.  5.   Obstructive sleep apnea-patient says he was diagnosed with obstructive sleep apnea in the past and was recommended to use CPAP.  He says he used to CPAP briefly but felt uncomfortable with the CPAP therefore he stopped using it. -Discussed with the patient about the risks of untreated sleep apnea -Patient will require repeat a sleep study after discharge and resumption of CPAP management.  6.  Class II obesity -Low calorie diet, portion control and increase physical activity discussed with patient. -Body mass index is 37.57 kg/m.    7.  Elevated creatinine: Patient meeting criteria for acute on chronic kidney disease a stage II -Continue to follow trend closely -diuresis and CHF contributing to problem -Cr 1.77 now.  DVT prophylaxis: Subcutaneous Lovenox Code Status: Full code Family Communication: Patient awake and oriented, no family at the bedside. Disposition:   Status is: Inpatient  Remains inpatient appropriate because:Patient with volume overload, CHF exacerbation, uncontrolled hypertension requiring IV diuresis.   Dispo: The patient is from: Home              Anticipated d/c is to: Home              Anticipated d/c date is: 2 days              Patient currently is no medically stable for discharge; still with signs of fluid overload and feeling short of breath\short winded with activity.  Actively receiving IV Lasix and further adjustment on antihypertensive agents.  Continue to closely follow electrolytes and renal function.     Consultants:  Cardiology  Procedures:  None  Antimicrobials:   None    Subjective: Some improvement in blood pressure appreciated with most recent adjustment; denies chest pain, no palpitations, no nausea, no vomiting.  Improving urine output.  Still short winded with activity, no requiring oxygen supplementation and able to speak in full sentences.  Positive signs of fluid overload appreciated on examination.  Patient reports  orthopnea.  Objective: Vitals:   04/09/20 0500 04/09/20 0547 04/09/20 1019 04/09/20 1800  BP:  (!) 175/101 (!) 162/90 (!) 142/82  Pulse:  92 96 96  Resp:  18 18 16   Temp:  98.6 F (37 C)    TempSrc:      SpO2:  91% 98% 98%  Weight: 122.2 kg     Height:        Intake/Output Summary (Last 24 hours) at 04/09/2020 1857 Last data filed at 04/09/2020 1530 Gross per 24 hour  Intake 6 ml  Output 2450 ml  Net -2444 ml   Filed Weights   04/07/20 0448 04/08/20 0600 04/09/20 0500  Weight: 125 kg 122.6 kg 122.2 kg    Examination: General exam: Alert, awake, oriented x 3; no chest pain, no nausea, no vomiting, no fever.  Still with signs of fluid overload and complains of orthopnea (even more improved according to patient reports).  Patient able to speak in full sentences but very short winded with activity. Respiratory system: Decreased breath sounds at the bases, no wheezing, no using accessory muscle.  Good oxygen saturation on room air. Cardiovascular system: RRR. No murmurs, rubs, gallops.  1-2+ edema appreciated bilaterally; TED hoses in place. Gastrointestinal system: Abdomen is nondistended, soft and nontender. No organomegaly or masses felt. Normal bowel sounds heard. Central nervous system: Alert and oriented. No focal neurological deficits. Extremities: No cyanosis or clubbing. Skin: No rashes, no petechiae. Psychiatry: Judgement and insight appear normal. Mood & affect appropriate.   Data Reviewed: I have personally reviewed following labs and imaging studies  CBC: Recent Labs  Lab 04/05/20 1350 04/06/20 0249 04/07/20 0602  WBC 5.1 4.8 5.2  NEUTROABS 2.9  --   --   HGB 10.8* 10.5* 10.8*  HCT 33.4* 32.3* 34.0*  MCV 83.9 83.9 83.5  PLT 242 252 809    Basic Metabolic Panel: Recent Labs  Lab 04/05/20 1350 04/06/20 0847 04/07/20 0602 04/08/20 0820 04/09/20 0844  NA 140 142 141 140 141  K 3.5 3.6 3.5 4.0 3.7  CL 106 106 104 103 103  CO2 26 28 27 29 30   GLUCOSE  192* 138* 124* 172* 139*  BUN 33* 30* 31* 37* 34*  CREATININE 1.67* 1.58* 1.66* 1.96* 1.77*  CALCIUM 8.1* 8.2* 8.2* 8.4* 8.1*  MG  --   --   --  1.7  --     GFR: Estimated Creatinine Clearance: 62.8 mL/min (A) (by C-G formula based on SCr of 1.77 mg/dL (H)).  Liver Function Tests: Recent Labs  Lab 04/05/20 1350  AST 32  ALT 35  ALKPHOS 94  BILITOT 0.4  PROT 6.0*  ALBUMIN 3.0*    CBG: Recent Labs  Lab 04/08/20 1128 04/08/20 1639 04/09/20 0801 04/09/20 1135 04/09/20 1630  GLUCAP 214* 158* 122* 206* 231*     Recent Results (from the past 240 hour(s))  SARS Coronavirus 2 by RT PCR (hospital order, performed in [facility] hospital lab) Nasopharyngeal Nasopharyngeal Swab     Status: None   Collection Time: 04/05/20  1:53 PM   Specimen: Nasopharyngeal Swab  Result Value  Ref Range Status   SARS Coronavirus 2 NEGATIVE NEGATIVE Final    Comment: (NOTE) SARS-CoV-2 target nucleic acids are NOT DETECTED.  The SARS-CoV-2 RNA is generally detectable in upper and lower respiratory specimens during the acute phase of infection. The lowest concentration of SARS-CoV-2 viral copies this assay can detect is 250 copies / mL. A negative result does not preclude SARS-CoV-2 infection and should not be used as the sole basis for treatment or other patient management decisions.  A negative result may occur with improper specimen collection / handling, submission of specimen other than nasopharyngeal swab, presence of viral mutation(s) within the areas targeted by this assay, and inadequate number of viral copies (<250 copies / mL). A negative result must be combined with clinical observations, patient history, and epidemiological information.  Fact Sheet for Patients:   StrictlyIdeas.no  Fact Sheet for Healthcare Providers: BankingDealers.co.za  This test is not yet approved or  cleared by the [place] FDA and has been authorized  for detection and/or diagnosis of SARS-CoV-2 by FDA under an Emergency Use Authorization (EUA).  This EUA will remain in effect (meaning this test can be used) for the duration of the COVID-19 declaration under Section 564(b)(1) of the Act, 21 U.S.C. section 360bbb-3(b)(1), unless the authorization is terminated or revoked sooner.  Performed at [facility], [address], [place], [place] [address]      Radiology Studies: No results found.  Scheduled Meds: . amLODipine  10 mg Oral Daily  . enoxaparin (LOVENOX) injection  40 mg Subcutaneous Q12H  . furosemide  40 mg Intravenous BID  . hydrALAZINE  100 mg Oral TID  . insulin aspart  0-9 Units Subcutaneous TID WC  . labetalol  200 mg Oral BID  . pravastatin  40 mg Oral Daily  . sodium chloride flush  3 mL Intravenous Q12H   Continuous Infusions: . sodium chloride       LOS: 4 days    Barton Dubois MD [facility]   To contact the attending provider between 7A-7P or the covering provider during after hours 7P-7A, please log into the web site [email or web address] and access using universal [place] password for that web site. If you do not have the password, please call the hospital operator.  04/09/2020, 6:57 PM

## 2020-04-09 NOTE — Progress Notes (Signed)
Progress Note  Patient Name: TEAGHAN FORMICA Date of Encounter: 04/09/2020  [facility] Cardiologist: Carlyle Dolly, MD   Subjective   Breathing is improving.  Inpatient Medications    Scheduled Meds: . amLODipine  10 mg Oral Daily  . enoxaparin (LOVENOX) injection  40 mg Subcutaneous Q12H  . furosemide  40 mg Intravenous BID  . hydrALAZINE  75 mg Oral TID  . insulin aspart  0-9 Units Subcutaneous TID WC  . labetalol  100 mg Oral BID  . pravastatin  40 mg Oral Daily  . sodium chloride flush  3 mL Intravenous Q12H   Continuous Infusions: . sodium chloride     PRN Meds: sodium chloride, hydrALAZINE, ondansetron **OR** ondansetron (ZOFRAN) IV, sodium chloride flush   Vital Signs    Vitals:   04/08/20 2110 04/08/20 2245 04/09/20 0500 04/09/20 0547  BP:  (!) 159/91  (!) 175/101  Pulse:  90  92  Resp:    18  Temp:    98.6 F (37 C)  TempSrc:      SpO2: 91%   91%  Weight:   122.2 kg   Height:        Intake/Output Summary (Last 24 hours) at 04/09/2020 0951 Last data filed at 04/08/2020 2058 Gross per 24 hour  Intake 240 ml  Output 3400 ml  Net -3160 ml   Last 3 Weights 04/09/2020 04/08/2020 04/07/2020  Weight (lbs) 269 lb 6.4 oz 270 lb 4.5 oz 275 lb 9.2 oz  Weight (kg) 122.2 kg 122.6 kg 125 kg      Telemetry    SR- Personally Reviewed  ECG    n/a - Personally Reviewed  Physical Exam   GEN: No acute distress.   Neck: elevated JVD Cardiac: RRR, no murmurs, rubs, or gallops.  Respiratory: Clear to auscultation bilaterally. GI: Soft, nontender, non-distended  MS: 1+bilateral LE edema; No deformity. Neuro:  Nonfocal  Psych: Normal affect   Labs    High Sensitivity Troponin:   Recent Labs  Lab 04/05/20 1350 04/05/20 1531  TROPONINIHS 14 14      Chemistry Recent Labs  Lab 04/05/20 1350 04/06/20 0847 04/07/20 0602 04/08/20 0820 04/09/20 0844  NA 140   < > 141 140 141  K 3.5   < > 3.5 4.0 3.7  CL 106   < > 104 103 103  CO2 26   < > 27  29 30   GLUCOSE 192*   < > 124* 172* 139*  BUN 33*   < > 31* 37* 34*  CREATININE 1.67*   < > 1.66* 1.96* 1.77*  CALCIUM 8.1*   < > 8.2* 8.4* 8.1*  PROT 6.0*  --   --   --   --   ALBUMIN 3.0*  --   --   --   --   AST 32  --   --   --   --   ALT 35  --   --   --   --   ALKPHOS 94  --   --   --   --   BILITOT 0.4  --   --   --   --   GFRNONAA 45*   < > 46* 37* 42*  GFRAA 53*   < > 53* 43* 49*  ANIONGAP 8   < > 10 8 8    < > = values in this interval not displayed.     Hematology Recent Labs  Lab 04/05/20 1350 04/06/20 0249  04/07/20 0602  WBC 5.1 4.8 5.2  RBC 3.98* 3.85* 4.07*  HGB 10.8* 10.5* 10.8*  HCT 33.4* 32.3* 34.0*  MCV 83.9 83.9 83.5  MCH 27.1 27.3 26.5  MCHC 32.3 32.5 31.8  RDW 14.0 14.2 13.9  PLT 242 252 269    BNP Recent Labs  Lab 04/05/20 1350  BNP 311.0*     DDimer No results for input(s): DDIMER in the last 168 hours.   Radiology    No results found.  Cardiac Studies     Patient Profile     54 y.o. male w/ PMHof HTN, HLD, and Type 2 DMwho presented with a 1 month history of progressive dyspnea and lower extremity edema. Admitted for an acute CHF exacerbation.  Assessment & Plan    1. Acute diastolic HF - Echocardiogram shows a low-normal EF of 50% with no regional WMA. Noted to have severe LVH and Grade 2 DD. - negative 4 L yesterday, neg 6.5 L since admission. He is on IV lasix 40mg  bid, fluctuations in renal function without clear trend.   - diuresing well, continue current IV lasix.    2. Resistant HTN - Admitted with severe HTN SBPs in the 200s. Medication options limited due to renal dysfunction - on norvasc 10mg  daily, hydralazine 75 mg tid, labetalol 100mg  bid - bp's remain elevated, increase hydral to 100mg  tid. To start labetalol this AM, would increase to 200mg  bid.  - for consideration of secondary HTN add renin/aldo to labs, TSH. WIll need outpatient sleep study. If initial workup benign consider renal arter [place]  3. OSA -  will need outpatient eval - may be playing a role in resistant HTN      For questions or updates, please contact [place] Please consult [email or web address] for contact info under        Signed, Carlyle Dolly, MD  04/09/2020, 9:51 AM

## 2020-04-10 DIAGNOSIS — E669 Obesity, unspecified: Secondary | ICD-10-CM

## 2020-04-10 DIAGNOSIS — I5033 Acute on chronic diastolic (congestive) heart failure: Secondary | ICD-10-CM

## 2020-04-10 DIAGNOSIS — I10 Essential (primary) hypertension: Secondary | ICD-10-CM

## 2020-04-10 DIAGNOSIS — E1121 Type 2 diabetes mellitus with diabetic nephropathy: Secondary | ICD-10-CM

## 2020-04-10 DIAGNOSIS — N179 Acute kidney failure, unspecified: Secondary | ICD-10-CM

## 2020-04-10 DIAGNOSIS — E66812 Obesity, class 2: Secondary | ICD-10-CM

## 2020-04-10 DIAGNOSIS — N1831 Chronic kidney disease, stage 3a: Secondary | ICD-10-CM

## 2020-04-10 LAB — BASIC METABOLIC PANEL
Anion gap: 8 (ref 5–15)
BUN: 40 mg/dL — ABNORMAL HIGH (ref 6–20)
CO2: 30 mmol/L (ref 22–32)
Calcium: 8 mg/dL — ABNORMAL LOW (ref 8.9–10.3)
Chloride: 101 mmol/L (ref 98–111)
Creatinine, Ser: 2.12 mg/dL — ABNORMAL HIGH (ref 0.61–1.24)
GFR calc Af Amer: 39 mL/min — ABNORMAL LOW (ref >60)
GFR calc non Af Amer: 34 mL/min — ABNORMAL LOW (ref >60)
Glucose, Bld: 170 mg/dL — ABNORMAL HIGH (ref 70–99)
Potassium: 3.8 mmol/L (ref 3.5–5.1)
Sodium: 139 mmol/L (ref 135–145)

## 2020-04-10 LAB — TSH: TSH: 2.318 u[IU]/mL (ref 0.350–4.500)

## 2020-04-10 LAB — GLUCOSE, CAPILLARY
Glucose-Capillary: 149 mg/dL — ABNORMAL HIGH (ref 70–99)
Glucose-Capillary: 213 mg/dL — ABNORMAL HIGH (ref 70–99)

## 2020-04-10 MED ORDER — METFORMIN HCL ER 500 MG PO TB24
1000.0000 mg | ORAL_TABLET | Freq: Two times a day (BID) | ORAL | 2 refills | Status: DC
Start: 2020-04-10 — End: 2020-10-01

## 2020-04-10 MED ORDER — FUROSEMIDE 40 MG PO TABS: 60.0000 mg | ORAL_TABLET | Freq: Every day | ORAL | 2 refills | Status: DC

## 2020-04-10 MED ORDER — AMLODIPINE BESYLATE 10 MG PO TABS
10.0000 mg | ORAL_TABLET | Freq: Every day | ORAL | 2 refills | Status: DC
Start: 2020-04-11 — End: 2020-04-28

## 2020-04-10 MED ORDER — LABETALOL HCL 200 MG PO TABS
200.0000 mg | ORAL_TABLET | Freq: Two times a day (BID) | ORAL | 2 refills | Status: DC
Start: 2020-04-10 — End: 2020-04-28

## 2020-04-10 MED ORDER — HYDRALAZINE HCL 100 MG PO TABS
100.0000 mg | ORAL_TABLET | Freq: Three times a day (TID) | ORAL | 2 refills | Status: DC
Start: 2020-04-10 — End: 2020-10-01

## 2020-04-10 NOTE — Discharge Summary (Signed)
Physician Discharge Summary  GABERIAL CADA WOE:[id number] DOB: 07/12/1966 DOA: 04/05/2020  PCP: [name], MD  Admit date: 04/05/2020 Discharge date: 04/10/2020  Time spent: 35 minutes  Recommendations for Outpatient Follow-up:  1. Repeat basic metabolic panel to follow across renal function 2. Reassess blood pressure and further adjust antihypertensive regimen as needed 3. Close monitoring of patient's CBGs with repeat A1c and further adjustment to hypoglycemic regimen recommended. 4. Please assist patient in getting sleep study as an outpatient with resumption of CPAP if required   Discharge Diagnoses:  Active Problems:   Bilateral leg edema   Essential hypertension   Type 2 diabetes with nephropathy (HCC)   Acute diastolic CHF (congestive heart failure) (HCC)   Acute renal failure superimposed on stage 3a chronic kidney disease (HCC)   Class 2 obesity Obstructive sleep apnea  Discharge Condition: Stable and improved.  Discharged home with instruction to follow-up with PCP in 10 days.  Patient will also follow-up with cardiology service as an outpatient.  CODE STATUS: Full code.  Diet recommendation: Heart healthy diet and modified carbohydrate.  Filed Weights   04/08/20 0600 04/09/20 0500 04/10/20 0540  Weight: 122.6 kg 122.2 kg 122.6 kg    History of present illness:  CharlesMoyeris a54 y.o.male,with history of hypertension, diabetes mellitus type 2, presented to ED with complaints of 1 month history of bilateral lower extremity swelling and shortness of breath on exertion. Patient states that he has hypertension and has been compliant with taking his medications. He was seen in the ED on 03/03/2020 and was prescribed cefdinir for pneumonia. Patient said that he has noted increasing swelling of lower extremities and has gained weight.  In the ED, patient was found to be in hypertensive urgency, BNP elevated at 311. Creatinine was elevated 1.67, last creatinine  from 2012 was 0.83. Patient was given Lasix 40 mg IV x1.  Hospital Course:  1. Acute hypertensive diastolic CHF.  -2D echocardiogram demonstrating hypertensive HF, grade 2 diastolic CHF -Patient diuresis successfully and no longer short of breath, no experiencing orthopnea and a slightly increased in his baseline renal function appreciated. -Discharged home on Lasix 60 mg by mouth daily. -Continue to follow daily weights. -low sodium diet emphasized  -continue BP control and follow with cardiology service as an outpatient.  2. Diabetes mellitus type 2: with nephropathy -Patient admits to being noncompliant with his diabetic diet in the past but now trying to be more careful.  -A1c 9.5.   -Resume adjusted dose of hypoglycemic regimen -Patient instructed to follow modified carbohydrate diet -Close monitoring of patient's CBGs and repeat A1c to further determine hypoglycemic management is recommended -Patient moving towards the need of insulin therapy.  3.  Uncontrolled/accelerated hypertension -blood pressure much improved at time of discharge.  Will require further adjustment to antihypertensive regimen in the next 2 weeks. -Continue the use of hydralazine now 100 mg 3 times daily, 10 mg of Norvasc daily, labetalol 200mg  BID and Lasix 60 mg by mouth daily. -Due to increasing creatinine level we will continue holding the use of ACE inhibitors or ARB. -Patient advised to follow low-sodium diet and to maintain adequate hydration.   4. Hyperlipidemia -continue Pravachol.  -Heart healthy diet has been encouraged.  5.  Obstructive sleep apnea-patient says he was diagnosed with obstructive sleep apnea in the past and was recommended to use CPAP.  He says he used to CPAP briefly but felt uncomfortable with the CPAP therefore he stopped using it. -Discussed with the patient about the  risks of untreated sleep apnea -Patient will require repeat a sleep study after discharge and resumption of  CPAP management.  6.  Class II obesity -Low calorie diet, portion control and increase physical activity discussed with patient. -Body mass index is 37.57 kg/m.    7.  Elevated creatinine: Patient meeting criteria for acute on chronic kidney disease a stage II -diuresis and CHF contributing to problem. -Cr 2.1 at time of discharge.   -Recommended repeat basic metabolic panel follow-up visit to reassess renal function and electrolytes stability. -Patient advised to maintain adequate hydration and continue minimizing the use of nephrotoxic agents.   Procedures:  See below for x-ray reports  2D echo: Demonstrating grade 2 diastolic dysfunction, no wall motion normality.  No significant valvular disorders.  Consultations:  Cardiology service  Discharge Exam: Vitals:   04/10/20 0540 04/10/20 0937  BP: (!) 153/82 (!) 165/90  Pulse: 89 90  Resp: 20   Temp: 99.1 F (37.3 C)   SpO2: 95%     General: Afebrile, no chest pain, no nausea, no vomiting, no orthopnea.  Reports feeling back to his baseline from a breathing standpoint and is ready to go home. Cardiovascular: S1 and S2, no rubs, no gallops, no murmurs.  No JVD on exam. Respiratory: Improved air movement bilaterally, no wheezing, no frank crackles, no using accessory muscle.  Good oxygen saturation on room air. Abdomen: Soft, nontender, distended, positive bowel sounds Extremities: No cyanosis or clubbing; TED hoses in place.  Trace to 1+ edema appreciated bilaterally.  Discharge Instructions   Discharge Instructions    (HEART FAILURE PATIENTS) Call MD:  Anytime you have any of the following symptoms: 1) 3 pound weight gain in 24 hours or 5 pounds in 1 week 2) shortness of breath, with or without a dry hacking cough 3) swelling in the hands, feet or stomach 4) if you have to sleep on extra pillows at night in order to breathe.   Complete by: As directed    Diet - low sodium heart healthy   Complete by: As directed     Diet Carb Modified   Complete by: As directed    Discharge instructions   Complete by: As directed    Medications are prescribed Maintain adequate hydration Check your weight on daily basis Follow heart healthy/modified carbohydrate diet (less than 2.5 g of sodium on daily basis). Arrange follow-up with PCP in 10 days Follow-up with cardiology service as instructed. Continue to use TED hoses 12 hours on 12 hours off to assist with lower extremity swelling. Check blood sugar at least 3 times a day and keep log of your measurements for future adjustments.     Allergies as of 04/10/2020   No Known Allergies     Medication List    STOP taking these medications   aspirin 325 MG tablet   azithromycin 250 MG tablet Commonly known as: Zithromax Z-Pak   cefdinir 300 MG capsule Commonly known as: OMNICEF   colchicine 0.6 MG tablet   ibuprofen 200 MG tablet Commonly known as: ADVIL   lisinopril 10 MG tablet Commonly known as: ZESTRIL     TAKE these medications   amLODipine 10 MG tablet Commonly known as: NORVASC Take 1 tablet (10 mg total) by mouth daily. Start taking on: April 11, 2020 What changed:   medication strength  how much to take   furosemide 40 MG tablet Commonly known as: Lasix Take 1.5 tablets (60 mg total) by mouth daily.   glimepiride 4  MG tablet Commonly known as: Amaryl Take 1 tablet (4 mg total) by mouth daily before breakfast. What changed: when to take this   hydrALAZINE 100 MG tablet Commonly known as: APRESOLINE Take 1 tablet (100 mg total) by mouth 3 (three) times daily.   labetalol 200 MG tablet Commonly known as: NORMODYNE Take 1 tablet (200 mg total) by mouth 2 (two) times daily.   metFORMIN 500 MG 24 hr tablet Commonly known as: GLUCOPHAGE-XR Take 2 tablets (1,000 mg total) by mouth in the morning and at bedtime. What changed: how much to take   multivitamin with minerals Tabs tablet Take 1 tablet by mouth daily.    pravastatin 40 MG tablet Commonly known as: PRAVACHOL Take 40 mg by mouth daily.      No Known Allergies  Follow-up Information    [name], MD. Schedule an appointment as soon as possible for a visit in 10 day(s).   Specialty: Family Medicine Contact information: [place] [address] [phone]        [name], MD .   Specialty: Cardiology Contact information: [address] [place] [address] [phone]               The results of significant diagnostics from this hospitalization (including imaging, microbiology, ancillary and laboratory) are listed below for reference.    Significant Diagnostic Studies: DG Chest Portable 1 View  Result Date: 04/05/2020 CLINICAL DATA:  Shortness of breath and lower extremity edema EXAM: PORTABLE CHEST 1 VIEW COMPARISON:  March 03, 2020 FINDINGS: There is a small left pleural effusion. There is mild left base atelectasis. Lungs elsewhere are clear. Heart is borderline enlarged with pulmonary vascularity normal. No adenopathy. No bone lesions. IMPRESSION: Small left pleural effusion with mild left base atelectasis. Lungs elsewhere clear. Stable cardiac prominence. No adenopathy appreciable. Electronically Signed   By: Lowella Grip III M.D.   On: 04/05/2020 13:40   ECHOCARDIOGRAM COMPLETE  Result Date: 04/06/2020    ECHOCARDIOGRAM REPORT   Patient Name:   [name] Date of Exam: 04/06/2020 Medical Rec #:  [id number]       Height:       71.0 in Accession #:    [id number]      Weight:       270.0 lb Date of Birth:  29-Dec-1965       BSA:          2.395 m Patient Age:    54 years        BP:           182/105 mmHg Patient Gender: M               HR:           80 bpm. Exam Location:  [facility] Procedure: 2D Echo Indications:    CHF-Acute Diastolic 390.30 / S92.33  History:        Patient has no prior history of Echocardiogram examinations.                 Risk Factors:Diabetes, Non-Smoker  and Hypertension. Bilateral                 Edema.  Sonographer:    Leavy Cella RDCS (AE) Referring Phys: [place]  1. Left ventricular ejection fraction, by estimation, is 50%. The left ventricle has normal function. The left ventricle has no regional wall motion abnormalities. There is severe left ventricular hypertrophy.  Left ventricular diastolic parameters are consistent with Grade II diastolic dysfunction (pseudonormalization). Elevated left atrial pressure.  2. Right ventricular systolic function is normal. The right ventricular size is normal.  3. Left atrial size was mildly dilated.  4. Large pleural effusion in the left lateral region.  5. The mitral valve is normal in structure. Trivial mitral valve regurgitation. No evidence of mitral stenosis.  6. The aortic valve is tricuspid. Aortic valve regurgitation is not visualized. No aortic stenosis is present.  7. The inferior vena cava is normal in size with greater than 50% respiratory variability, suggesting right atrial pressure of 3 mmHg. FINDINGS  Left Ventricle: Left ventricular ejection fraction, by estimation, is 50%. The left ventricle has normal function. The left ventricle has no regional wall motion abnormalities. The left ventricular internal cavity size was normal in size. There is severe left ventricular hypertrophy. Left ventricular diastolic parameters are consistent with Grade II diastolic dysfunction (pseudonormalization). Elevated left atrial pressure. Right Ventricle: The right ventricular size is normal. No increase in right ventricular wall thickness. Right ventricular systolic function is normal. Left Atrium: Left atrial size was mildly dilated. Right Atrium: Right atrial size was normal in size. Pericardium: There is no evidence of pericardial effusion. Mitral Valve: The mitral valve is normal in structure. Trivial mitral valve regurgitation. No evidence of mitral valve stenosis. Tricuspid Valve: The tricuspid  valve is normal in structure. Tricuspid valve regurgitation is not demonstrated. No evidence of tricuspid stenosis. Aortic Valve: The aortic valve is tricuspid. Aortic valve regurgitation is not visualized. No aortic stenosis is present. Aortic valve mean gradient measures 2.0 mmHg. Aortic valve peak gradient measures 3.9 mmHg. Aortic valve area, by VTI measures 2.73 cm. Pulmonic Valve: The pulmonic valve was not well visualized. Pulmonic valve regurgitation is not visualized. No evidence of pulmonic stenosis. Aorta: The aortic root is normal in size and structure. Pulmonary Artery: Indeterminant PASP, inadequate TR jet. Venous: The inferior vena cava is normal in size with greater than 50% respiratory variability, suggesting right atrial pressure of 3 mmHg. IAS/Shunts: No atrial level shunt detected by color flow Doppler. Additional Comments: There is a large pleural effusion in the left lateral region.  LEFT VENTRICLE PLAX 2D LVIDd:         5.09 cm  Diastology LVIDs:         3.99 cm  LV e' medial:    7.29 cm/s LV PW:         1.50 cm  LV E/e' medial:  13.0 LV IVS:        1.42 cm  LV e' lateral:   6.42 cm/s LVOT diam:     2.10 cm  LV E/e' lateral: 14.8 LV SV:         52 LV SV Index:   22 LVOT Area:     3.46 cm  RIGHT VENTRICLE RV S prime:     11.00 cm/s TAPSE (M-mode): 2.5 cm LEFT ATRIUM           Index       RIGHT ATRIUM           Index LA diam:      4.50 cm 1.88 cm/m  RA Area:     15.00 cm LA Vol (A2C): 65.3 ml 27.26 ml/m RA Volume:   40.70 ml  16.99 ml/m LA Vol (A4C): 66.0 ml 27.55 ml/m  AORTIC VALVE AV Area (Vmax):    2.66 cm AV Area (Vmean):   2.51 cm AV Area (VTI):  2.73 cm AV Vmax:           98.82 cm/s AV Vmean:          67.098 cm/s AV VTI:            0.191 m AV Peak Grad:      3.9 mmHg AV Mean Grad:      2.0 mmHg LVOT Vmax:         75.84 cm/s LVOT Vmean:        48.631 cm/s LVOT VTI:          0.151 m LVOT/AV VTI ratio: 0.79  AORTA Ao Root diam: 2.80 cm MITRAL VALVE MV Area (PHT): 3.53 cm     SHUNTS MV Decel Time: 215 msec    Systemic VTI:  0.15 m MR Peak grad: 25.2 mmHg    Systemic Diam: 2.10 cm MR Vmax:      251.00 cm/s MV E velocity: 95.10 cm/s MV A velocity: 20.10 cm/s MV E/A ratio:  4.73 Carlyle Dolly MD Electronically signed by Carlyle Dolly MD Signature Date/Time: 04/06/2020/11:04:27 AM    Final     Microbiology: Recent Results (from the past 240 hour(s))  SARS Coronavirus 2 by RT PCR (hospital order, performed in [place] hospital lab) Nasopharyngeal Nasopharyngeal Swab     Status: None   Collection Time: 04/05/20  1:53 PM   Specimen: Nasopharyngeal Swab  Result Value Ref Range Status   SARS Coronavirus 2 NEGATIVE NEGATIVE Final    Comment: (NOTE) SARS-CoV-2 target nucleic acids are NOT DETECTED.  The SARS-CoV-2 RNA is generally detectable in upper and lower respiratory specimens during the acute phase of infection. The lowest concentration of SARS-CoV-2 viral copies this assay can detect is 250 copies / mL. A negative result does not preclude SARS-CoV-2 infection and should not be used as the sole basis for treatment or other patient management decisions.  A negative result may occur with improper specimen collection / handling, submission of specimen other than nasopharyngeal swab, presence of viral mutation(s) within the areas targeted by this assay, and inadequate number of viral copies (<250 copies / mL). A negative result must be combined with clinical observations, patient history, and epidemiological information.  Fact Sheet for Patients:   StrictlyIdeas.no  Fact Sheet for Healthcare Providers: BankingDealers.co.za  This test is not yet approved or  cleared by the [place] FDA and has been authorized for detection and/or diagnosis of SARS-CoV-2 by FDA under an Emergency Use Authorization (EUA).  This EUA will remain in effect (meaning this test can be used) for the duration of the COVID-19  declaration under Section 564(b)(1) of the Act, 21 U.S.C. section 360bbb-3(b)(1), unless the authorization is terminated or revoked sooner.  Performed at [facility], [address], [place], [place] [address]      Labs: Basic Metabolic Panel: Recent Labs  Lab 04/06/20 0847 04/07/20 0602 04/08/20 0820 04/09/20 0844 04/10/20 0639  NA 142 141 140 141 139  K 3.6 3.5 4.0 3.7 3.8  CL 106 104 103 103 101  CO2 28 27 29 30 30   GLUCOSE 138* 124* 172* 139* 170*  BUN 30* 31* 37* 34* 40*  CREATININE 1.58* 1.66* 1.96* 1.77* 2.12*  CALCIUM 8.2* 8.2* 8.4* 8.1* 8.0*  MG  --   --  1.7  --   --    Liver Function Tests: Recent Labs  Lab 04/05/20 1350  AST 32  ALT 35  ALKPHOS 94  BILITOT 0.4  PROT 6.0*  ALBUMIN 3.0*  CBC: Recent Labs  Lab 04/05/20 1350 04/06/20 0249 04/07/20 0602  WBC 5.1 4.8 5.2  NEUTROABS 2.9  --   --   HGB 10.8* 10.5* 10.8*  HCT 33.4* 32.3* 34.0*  MCV 83.9 83.9 83.5  PLT 242 252 269    BNP (last 3 results) Recent Labs    04/05/20 1350  BNP 311.0*    ProBNP (last 3 results) No results for input(s): PROBNP in the last 8760 hours.  CBG: Recent Labs  Lab 04/09/20 1135 04/09/20 1630 04/09/20 2113 04/10/20 0755 04/10/20 1119  GLUCAP 206* 231* 257* 149* 213*       Signed:  Barton Dubois MD.  [facility] 04/10/2020, 2:53 PM

## 2020-04-10 NOTE — Progress Notes (Signed)
Nsg Discharge Note  Admit Date:  04/05/2020 Discharge date: 04/10/2020   [name] to be D/C'd Home  per MD order.  AVS completed.  Patient able to verbalize understanding.  Discharge Medication: Allergies as of 04/10/2020   No Known Allergies     Medication List    STOP taking these medications   aspirin 325 MG tablet   azithromycin 250 MG tablet Commonly known as: Zithromax Z-Pak   cefdinir 300 MG capsule Commonly known as: OMNICEF   colchicine 0.6 MG tablet   ibuprofen 200 MG tablet Commonly known as: ADVIL   lisinopril 10 MG tablet Commonly known as: ZESTRIL     TAKE these medications   amLODipine 10 MG tablet Commonly known as: NORVASC Take 1 tablet (10 mg total) by mouth daily. Start taking on: April 11, 2020 What changed:   medication strength  how much to take   furosemide 40 MG tablet Commonly known as: Lasix Take 1.5 tablets (60 mg total) by mouth daily.   glimepiride 4 MG tablet Commonly known as: Amaryl Take 1 tablet (4 mg total) by mouth daily before breakfast. What changed: when to take this   hydrALAZINE 100 MG tablet Commonly known as: APRESOLINE Take 1 tablet (100 mg total) by mouth 3 (three) times daily.   labetalol 200 MG tablet Commonly known as: NORMODYNE Take 1 tablet (200 mg total) by mouth 2 (two) times daily.   metFORMIN 500 MG 24 hr tablet Commonly known as: GLUCOPHAGE-XR Take 2 tablets (1,000 mg total) by mouth in the morning and at bedtime. What changed: how much to take   multivitamin with minerals Tabs tablet Take 1 tablet by mouth daily.   pravastatin 40 MG tablet Commonly known as: PRAVACHOL Take 40 mg by mouth daily.       Discharge Assessment: Vitals:   04/10/20 0540 04/10/20 0937  BP: (!) 153/82 (!) 165/90  Pulse: 89 90  Resp: 20   Temp: 99.1 F (37.3 C)   SpO2: 95%    Skin clean, dry and intact without evidence of skin break down, no evidence of skin tears noted. IV catheter discontinued  intact. Site without signs and symptoms of complications - no redness or edema noted at insertion site, patient denies c/o pain - only slight tenderness at site.  Dressing with slight pressure applied.  D/c Instructions-Education: Discharge instructions given to patient with verbalized understanding. D/c education completed with patient including follow up instructions, medication list, d/c activities limitations if indicated, with other d/c instructions as indicated by MD - patient able to verbalize understanding, all questions fully answered. Patient instructed to return to ED, call 911, or call MD for any changes in condition.  Patient escorted via [place], and D/C home via private auto.  Berton Bon, RN 04/10/2020 3:05 PM

## 2020-04-21 ENCOUNTER — Ambulatory Visit (HOSPITAL_COMMUNITY)
Admission: RE | Admit: 2020-04-21 | Discharge: 2020-04-21 | Disposition: A | Discharge status: Home / Self Care | Payer: Commercial Managed Care - PPO | Source: Ambulatory Visit | Attending: Nurse Practitioner | Admitting: Nurse Practitioner

## 2020-04-21 ENCOUNTER — Other Ambulatory Visit: Payer: Self-pay | Admitting: Nurse Practitioner

## 2020-04-21 ENCOUNTER — Other Ambulatory Visit (HOSPITAL_COMMUNITY): Payer: Self-pay | Admitting: Nurse Practitioner

## 2020-04-21 ENCOUNTER — Other Ambulatory Visit: Payer: Self-pay

## 2020-04-21 DIAGNOSIS — R600 Localized edema: Secondary | ICD-10-CM | POA: Insufficient documentation

## 2020-04-27 NOTE — Progress Notes (Signed)
Cardiology Office Note    Date:  04/28/2020   ID:  [name], DOB 06-30-1966, MRN [id number]  PCP:  [name], MD  Cardiologist: Carlyle Dolly, MD EPS: None  Chief Complaint  Patient presents with  . Hospitalization Follow-up    History of Present Illness:  [name] is a 54 y.o. male with past medical history of HTN, HLD, and Type 2 DM   Patient discharged 04/10/2020 from [facility] after admission with acute CHF diuresed over 6 L.  Also had resistant hypertension.  Will need outpatient sleep study and consider renal artery ultrasound if initial work-up benign.  Most recent creatinine 2.12 on 04/10/2020.  Discharged home on Lasix 60 mg daily weight 269 lbs. Echo 04/06/20 EF 50% severe LVH, grade 2 DD  Patient comes in for f/u in [place] b/c no appt in [place]. Weight up to 280 lbs. Swelling about the same. Eating frozen dinners. Complains of 2 pillow orthopnea. Short of breath walking to the mailbox. Didn't take meds today and BP high. Was supposed to be on labetalol 200 mg bid and norvasc 10 mg daily and doesn't have.  Past Medical History:  Diagnosis Date  . Diabetes mellitus   . Hypertension     Past Surgical History:  Procedure Laterality Date  . INCISION AND DRAINAGE    . KNEE SURGERY      Current Medications: Current Meds  Medication Sig  . furosemide (LASIX) 40 MG tablet Take 1.5 tablets (60 mg total) by mouth 2 (two) times daily.  Marland Kitchen glimepiride (AMARYL) 4 MG tablet Take 1 tablet (4 mg total) by mouth daily before breakfast. (Patient taking differently: Take 4 mg by mouth in the morning and at bedtime. )  . hydrALAZINE (APRESOLINE) 100 MG tablet Take 1 tablet (100 mg total) by mouth 3 (three) times daily.  . metFORMIN (GLUCOPHAGE-XR) 500 MG 24 hr tablet Take 2 tablets (1,000 mg total) by mouth in the morning and at bedtime.  . Multiple Vitamin (MULTIVITAMIN WITH MINERALS) TABS tablet Take 1 tablet by mouth daily.  . pravastatin (PRAVACHOL)  40 MG tablet Take 40 mg by mouth daily.  . [DISCONTINUED] furosemide (LASIX) 40 MG tablet Take 1.5 tablets (60 mg total) by mouth daily.     Allergies:   Patient has no known allergies.   Social History   Socioeconomic History  . Marital status: Married    Spouse name: Not on file  . Number of children: Not on file  . Years of education: Not on file  . Highest education level: Not on file  Occupational History  . Not on file  Tobacco Use  . Smoking status: Never Smoker  . Smokeless tobacco: Never Used  Substance and Sexual Activity  . Alcohol use: No  . Drug use: No  . Sexual activity: Not on file  Other Topics Concern  . Not on file  Social History Narrative  . Not on file   Social Determinants of Health   Financial Resource Strain:   . Difficulty of Paying Living Expenses: Not on file  Food Insecurity:   . Worried About Charity fundraiser in the Last Year: Not on file  . Ran Out of Food in the Last Year: Not on file  Transportation Needs:   . Lack of Transportation (Medical): Not on file  . Lack of Transportation (Non-Medical): Not on file  Physical Activity:   . Days of Exercise per Week: Not on file  . Minutes of  Exercise per Session: Not on file  Stress:   . Feeling of Stress : Not on file  Social Connections:   . Frequency of Communication with Friends and Family: Not on file  . Frequency of Social Gatherings with Friends and Family: Not on file  . Attends Religious Services: Not on file  . Active Member of Clubs or Organizations: Not on file  . Attends Archivist Meetings: Not on file  . Marital Status: Not on file     Family History:  The patient's family history includes Dementia in his mother.   ROS:   Please see the history of present illness.    ROS All other systems reviewed and are negative.   PHYSICAL EXAM:   VS:  BP (!) 200/100   Pulse (!) 101   Ht _0  (1.803 m)   Wt 280 lb 12.8 oz (127.4 kg)   SpO2 94%   BMI 39.16 kg/m     Physical Exam  GEN: Obese, in no acute distress  Neck: no JVD, carotid bruits, or masses Cardiac:RRR; no murmurs, rubs, or gallops  Respiratory:  clear to auscultation bilaterally, normal work of breathing GI: soft, nontender, nondistended, + BS Ext: +3 edema bilaterally   Neuro:  Alert and Oriented x 3 Psych: euthymic mood, full affect  Wt Readings from Last 3 Encounters:  04/28/20 280 lb 12.8 oz (127.4 kg)  04/10/20 270 lb 4.5 oz (122.6 kg)  03/02/20 272 lb 4.8 oz (123.5 kg)      Studies/Labs Reviewed:   EKG:  EKG is not ordered today.    Recent Labs: 04/05/2020: ALT 35; B Natriuretic Peptide 311.0 04/07/2020: Hemoglobin 10.8; Platelets 269 04/08/2020: Magnesium 1.7 04/10/2020: BUN 40; Creatinine, Ser 2.12; Potassium 3.8; Sodium 139; TSH 2.318   Lipid Panel    Component Value Date/Time   CHOL 161 04/07/2020 0602   TRIG 80 04/07/2020 0602   HDL 58 04/07/2020 0602   CHOLHDL 2.8 04/07/2020 0602   VLDL 16 04/07/2020 0602   LDLCALC 87 04/07/2020 0602    Additional studies/ records that were reviewed today include:  04/06/2020  IMPRESSIONS     1. Left ventricular ejection fraction, by estimation, is 50%. The left  ventricle has normal function. The left ventricle has no regional wall  motion abnormalities. There is severe left ventricular hypertrophy. Left  ventricular diastolic parameters are  consistent with Grade II diastolic dysfunction (pseudonormalization).  Elevated left atrial pressure.   2. Right ventricular systolic function is normal. The right ventricular  size is normal.   3. Left atrial size was mildly dilated.   4. Large pleural effusion in the left lateral region.   5. The mitral valve is normal in structure. Trivial mitral valve  regurgitation. No evidence of mitral stenosis.   6. The aortic valve is tricuspid. Aortic valve regurgitation is not  visualized. No aortic stenosis is present.   7. The inferior vena cava is normal in size with greater than  50%  respiratory variability, suggesting right atrial pressure of 3 mmHg.   FINDINGS   Left Ventricle: Left ventricular ejection fraction, by estimation, is  50%. The left ventricle has normal function. The left ventricle has no  regional wall motion abnormalities. The left ventricular internal cavity  size was normal in size. There is  severe left ventricular hypertrophy. Left ventricular diastolic parameters  are consistent with Grade II diastolic dysfunction (pseudonormalization).  Elevated left atrial pressure.   Right Ventricle: The right ventricular size is normal.  No increase in  right ventricular wall thickness. Right ventricular systolic function is  normal.   Left Atrium: Left atrial size was mildly dilated.   Right Atrium: Right atrial size was normal in size.   Pericardium: There is no evidence of pericardial effusion.   Mitral Valve: The mitral valve is normal in structure. Trivial mitral  valve regurgitation. No evidence of mitral valve stenosis.   Tricuspid Valve: The tricuspid valve is normal in structure. Tricuspid  valve regurgitation is not demonstrated. No evidence of tricuspid  stenosis.   Aortic Valve: The aortic valve is tricuspid. Aortic valve regurgitation is  not visualized. No aortic stenosis is present. Aortic valve mean gradient  measures 2.0 mmHg. Aortic valve peak gradient measures 3.9 mmHg. Aortic  valve area, by VTI measures 2.73  cm.   Pulmonic Valve: The pulmonic valve was not well visualized. Pulmonic valve  regurgitation is not visualized. No evidence of pulmonic stenosis.   Aorta: The aortic root is normal in size and structure.   Pulmonary Artery: Indeterminant PASP, inadequate TR jet.   Venous: The inferior vena cava is normal in size with greater than 50%  respiratory variability, suggesting right atrial pressure of 3 mmHg.   IAS/Shunts: No atrial level shunt detected by color flow Doppler.   Additional Comments: There is a  large pleural effusion in the left lateral  region.    ASSESSMENT:    1. Acute on chronic diastolic (congestive) heart failure ([place])   2. Resistant hypertension   3. OSA (obstructive sleep apnea)   4. Hyperlipidemia, unspecified hyperlipidemia type   5. Type 2 diabetes with nephropathy (H[facility])      PLAN:  In order of problems listed above:  Acute diastolic HF - Echocardiogram shows a low-normal EF of 50% with no regional WMA. Noted to have severe LVH and Grade 2 DD.  neg 6.5 L during hospitalization.   Discharge weight 269 pounds.  Patient's weight is up to 280 pounds today.  Blood pressure has been running high.  He never got labetalol or amlodipine filled.  Eating frozen dinners.  Has not taken his meds yet today.  He has his meds here so I gave him his hydralazine and Lasix.  Increase Lasix to 60 mg twice daily check be met in follow-up next week in [place].  Ask home health to see and assist with CHF management       Resistant HTN - Admitted with severe HTN SBPs in the 200s. Medication options limited due to renal dysfunction -Was supposed to be on norvasc 64m daily,  labetalol 2064mbid in addition to hydralazine but has not gotten these amlodipine or labetalol filled. - bp's remain elevated, WIll need outpatient sleep study but will hold off on ordering until CHF controlled    OSA - will need outpatient eval - may be playing a role in resistant HTN    HLD on Pravachol  DM type II with nephropathy A1c 9.5 managed by PCP       Medication Adjustments/Labs and Tests Ordered: Current medicines are reviewed at length with the patient today.  Concerns regarding medicines are outlined above.  Medication changes, Labs and Tests ordered today are listed in the Patient Instructions below. Patient Instructions   Medication Instructions:  Your physician has recommended you make the following change in your medication:   START: Labetalol 20073mwice daily START:  Amlodipine 36m62mily INCREASE: Furosemide to 60mg47mce daily  *If you need a refill on your cardiac medications  before your next appointment, please call your pharmacy*   Lab Work: TODAY: BMET  If you have labs (blood work) drawn today and your tests are completely normal, you will receive your results only by: Marland Kitchen MyChart Message (if you have MyChart) OR . A paper copy in the mail If you have any lab test that is abnormal or we need to change your treatment, we will call you to review the results.   Testing/Procedures: None   Follow-Up: At [facility], you and your health needs are our priority.  As part of our continuing mission to provide you with exceptional heart care, we have created designated Provider Care Teams.  These Care Teams include your primary Cardiologist (physician) and Advanced Practice Providers (APPs -  Physician Assistants and Nurse Practitioners) who all work together to provide you with the care you need, when you need it.  We recommend signing up for the patient portal called "MyChart".  Sign up information is provided on this After Visit Summary.  MyChart is used to connect with patients for Virtual Visits (Telemedicine).  Patients are able to view lab/test results, encounter notes, upcoming appointments, etc.  Non-urgent messages can be sent to your provider as well.   To learn more about what you can do with MyChart, go to [email or web address].    Your next appointment:    05/03/2020 @ 1:30  The format for your next appointment:   In Person  Provider:   Ermalinda Barrios, PA-C   Two Gram Sodium Diet 2000 mg  What is Sodium? Sodium is a mineral found naturally in many foods. The most significant source of sodium in the diet is table salt, which is about 40% sodium.  Processed, convenience, and preserved foods also contain a large amount of sodium.  The body needs only 500 mg of sodium daily to function,  A normal diet provides more than enough sodium  even if you do not use salt.  Why Limit Sodium? A build up of sodium in the body can cause thirst, increased blood pressure, shortness of breath, and water retention.  Decreasing sodium in the diet can reduce edema and risk of heart attack or stroke associated with high blood pressure.  Keep in mind that there are many other factors involved in these health problems.  Heredity, obesity, lack of exercise, cigarette smoking, stress and what you eat all play a role.  General Guidelines:  Do not add salt at the table or in cooking.  One teaspoon of salt contains over 2 grams of sodium.  Read food labels  Avoid processed and convenience foods  Ask your dietitian before eating any foods not dicussed in the menu planning guidelines  Consult your physician if you wish to use a salt substitute or a sodium containing medication such as antacids.  Limit milk and milk products to 16 oz (2 cups) per day.  Shopping Hints:  READ LABELS!! "Dietetic" does not necessarily mean low sodium.  Salt and other sodium ingredients are often added to foods during processing.   Menu Planning Guidelines Food Group Choose More Often Avoid  Beverages (see also the milk group All fruit juices, low-sodium, salt-free vegetables juices, low-sodium carbonated beverages Regular vegetable or tomato juices, commercially softened water used for drinking or cooking  Breads and Cereals Enriched white, wheat, rye and pumpernickel bread, hard rolls and dinner rolls; muffins, cornbread and waffles; most dry cereals, cooked cereal without added salt; unsalted crackers and breadsticks; low sodium or homemade bread crumbs Bread,  rolls and crackers with salted tops; quick breads; instant hot cereals; pancakes; commercial bread stuffing; self-rising flower and biscuit mixes; regular bread crumbs or cracker crumbs  Desserts and Sweets Desserts and sweets mad with mild should be within allowance Instant pudding mixes and cake mixes  Fats  Butter or margarine; vegetable oils; unsalted salad dressings, regular salad dressings limited to 1 Tbs; light, sour and heavy cream Regular salad dressings containing bacon fat, bacon bits, and salt pork; snack dips made with instant soup mixes or processed cheese; salted nuts  Fruits Most fresh, frozen and canned fruits Fruits processed with salt or sodium-containing ingredient (some dried fruits are processed with sodium sulfites        Vegetables Fresh, frozen vegetables and low- sodium canned vegetables Regular canned vegetables, sauerkraut, pickled vegetables, and others prepared in brine; frozen vegetables in sauces; vegetables seasoned with ham, bacon or salt pork  Condiments, Sauces, Miscellaneous  Salt substitute with physician's approval; pepper, herbs, spices; vinegar, lemon or lime juice; hot pepper sauce; garlic powder, onion powder, low sodium soy sauce (1 Tbs.); low sodium condiments (ketchup, chili sauce, mustard) in limited amounts (1 tsp.) fresh ground horseradish; unsalted tortilla chips, pretzels, potato chips, popcorn, salsa (1/4 cup) Any seasoning made with salt including garlic salt, celery salt, onion salt, and seasoned salt; sea salt, rock salt, kosher salt; meat tenderizers; monosodium glutamate; mustard, regular soy sauce, barbecue, sauce, chili sauce, teriyaki sauce, steak sauce, Worcestershire sauce, and most flavored vinegars; canned gravy and mixes; regular condiments; salted snack foods, olives, picles, relish, horseradish sauce, catsup   Food preparation: Try these seasonings Meats:    Pork Sage, onion Serve with applesauce  Chicken Poultry seasoning, thyme, parsley Serve with cranberry sauce  Lamb Curry powder, rosemary, garlic, thyme Serve with mint sauce or jelly  Veal Marjoram, basil Serve with current jelly, cranberry sauce  Beef Pepper, bay leaf Serve with dry mustard, unsalted chive butter  Fish Bay leaf, dill Serve with unsalted lemon butter, unsalted  parsley butter  Vegetables:    Asparagus Lemon juice   Broccoli Lemon juice   Carrots Mustard dressing parsley, mint, nutmeg, glazed with unsalted butter and sugar   Green beans Marjoram, lemon juice, nutmeg,dill seed   Tomatoes Basil, marjoram, onion   Spice /blend for [employer]" 4 tsp ground thyme 1 tsp ground sage 3 tsp ground rosemary 4 tsp ground marjoram   Test your knowledge 1. A product that says "Salt Free" may still contain sodium. True or False 2. Garlic Powder and Hot Pepper Sauce an be used as alternative seasonings.True or False 3. Processed foods have more sodium than fresh foods.  True or False 4. Canned Vegetables have less sodium than froze True or False  WAYS TO DECREASE YOUR SODIUM INTAKE 1. Avoid the use of added salt in cooking and at the table.  Table salt (and other prepared seasonings which contain salt) is probably one of the greatest sources of sodium in the diet.  Unsalted foods can gain flavor from the sweet, sour, and butter taste sensations of herbs and spices.  Instead of using salt for seasoning, try the following seasonings with the foods listed.  Remember: how you use them to enhance natural food flavors is limited only by your creativity... Allspice-Meat, fish, eggs, fruit, peas, red and yellow vegetables Almond Extract-Fruit baked goods Anise Seed-Sweet breads, fruit, carrots, beets, cottage cheese, cookies (tastes like licorice) Basil-Meat, fish, eggs, vegetables, rice, vegetables salads, soups, sauces Bay Leaf-Meat, fish, stews, poultry Burnet-Salad, vegetables (  cucumber-like flavor) Caraway Seed-Bread, cookies, cottage cheese, meat, vegetables, cheese, rice Cardamon-Baked goods, fruit, soups Celery Powder or seed-Salads, salad dressings, sauces, meatloaf, soup, bread.Do not use  celery salt Chervil-Meats, salads, fish, eggs, vegetables, cottage cheese (parsley-like flavor) Chili Power-Meatloaf, chicken cheese, corn, eggplant, egg  dishes Chives-Salads cottage cheese, egg dishes, soups, vegetables, sauces Cilantro-Salsa, casseroles Cinnamon-Baked goods, fruit, pork, lamb, chicken, carrots Cloves-Fruit, baked goods, fish, pot roast, green beans, beets, carrots Coriander-Pastry, cookies, meat, salads, cheese (lemon-orange flavor) Cumin-Meatloaf, fish,cheese, eggs, cabbage,fruit pie (caraway flavor) [employer], fruit, eggs, fish, poultry, cottage cheese, vegetables Dill Seed-Meat, cottage cheese, poultry, vegetables, fish, salads, bread Fennel Seed-Bread, cookies, apples, pork, eggs, fish, beets, cabbage, cheese, Licorice-like flavor Garlic-(buds or powder) Salads, meat, poultry, fish, bread, butter, vegetables, potatoes.Do not  use garlic salt Ginger-Fruit, vegetables, baked goods, meat, fish, poultry Horseradish Root-Meet, vegetables, butter Lemon Juice or Extract-Vegetables, fruit, tea, baked goods, fish salads Mace-Baked goods fruit, vegetables, fish, poultry (taste like nutmeg) Maple Extract-Syrups Marjoram-Meat, chicken, fish, vegetables, breads, green salads (taste like Sage) Mint-Tea, lamb, sherbet, vegetables, desserts, carrots, cabbage Mustard, Dry or Seed-Cheese, eggs, meats, vegetables, poultry Nutmeg-Baked goods, fruit, chicken, eggs, vegetables, desserts Onion Powder-Meat, fish, poultry, vegetables, cheese, eggs, bread, rice salads (Do not use   Onion salt) Orange Extract-Desserts, baked goods Oregano-Pasta, eggs, cheese, onions, pork, lamb, fish, chicken, vegetables, green salads Paprika-Meat, fish, poultry, eggs, cheese, vegetables Parsley Flakes-Butter, vegetables, meat fish, poultry, eggs, bread, salads (certain forms may   Contain sodium Pepper-Meat fish, poultry, vegetables, eggs Peppermint Extract-Desserts, baked goods Poppy Seed-Eggs, bread, cheese, fruit dressings, baked goods, noodles, vegetables, cottage  [employer], poultry, meat, fish,  cauliflower, turnips,eggs bread Saffron-Rice, bread, veal, chicken, fish, eggs Sage-Meat, fish, poultry, onions, eggplant, tomateos, pork, stews Savory-Eggs, salads, poultry, meat, rice, vegetables, soups, pork Tarragon-Meat, poultry, fish, eggs, butter, vegetables (licorice-like flavor)  Thyme-Meat, poultry, fish, eggs, vegetables, (clover-like flavor), sauces, soups Tumeric-Salads, butter, eggs, fish, rice, vegetables (saffron-like flavor) Vanilla Extract-Baked goods, candy Vinegar-Salads, vegetables, meat marinades Walnut Extract-baked goods, candy  2. Choose your Foods Wisely   The following is a list of foods to avoid which are high in sodium:  Meats-Avoid all smoked, canned, salt cured, dried and kosher meat and fish as well as Anchovies   [employer] meats:Bologna, Liverwurst, Pastrami Canned meat or fish  Marinated herring Caviar    Pepperoni Corned Beef   Pizza Dried chipped beef  Salami Frozen breaded fish or meat Salt pork Frankfurters or hot dogs  Sardines Gefilte fish   Sausage Ham (boiled ham, Proscuitto Smoked butt    spiced ham)   Spam      TV Dinners Vegetables Canned vegetables (Regular) Relish Canned mushrooms  Sauerkraut Olives    Tomato juice Pickles  Bakery and Dessert Products Canned puddings  Cream pies Cheesecake   Decorated cakes Cookies  Beverages/Juices Tomato juice, regular  Gatorade   V-8 vegetable juice, regular  Breads and Cereals Biscuit mixes   Salted potato chips, corn chips, pretzels Bread stuffing mixes  Salted crackers and rolls Pancake and waffle mixes Self-rising flour  Seasonings Accent    Meat sauces Barbecue sauce  Meat tenderizer Catsup    Monosodium glutamate (MSG) Celery salt   Onion salt Chili sauce   Prepared mustard Garlic salt   Salt, seasoned salt, sea salt Gravy mixes   Soy sauce Horseradish   Steak sauce Ketchup   Tartar sauce Lite salt    Teriyaki sauce Marinade mixes   Worcestershire  sauce  Others Baking powder   Cocoa and cocoa mixes Baking soda   Commercial casserole mixes Candy-caramels, chocolate  Dehydrated soups    Bars, fudge,nougats  Instant rice and pasta mixes Canned broth or soup  Maraschino cherries Cheese, aged and processed cheese and cheese spreads  Learning Assessment Quiz  Indicated T (for True) or F (for False) for each of the following statements:  1. _____ Fresh fruits and vegetables and unprocessed grains are generally low in sodium 2. _____ Water may contain a considerable amount of sodium, depending on the source 3. _____ You can always tell if a food is high in sodium by tasting it 4. _____ Certain laxatives my be high in sodium and should be avoided unless prescribed   by a physician or pharmacist 5. _____ Salt substitutes may be used freely by anyone on a sodium restricted diet 6. _____ Sodium is present in table salt, food additives and as a natural component of   most foods 7. _____ Table salt is approximately 90% sodium 8. _____ Limiting sodium intake may help prevent excess fluid accumulation in the body 9. _____ On a sodium-restricted diet, seasonings such as bouillon soy sauce, and    cooking wine should be used in place of table salt 10. _____ On an ingredient list, a product which lists monosodium glutamate as the first   ingredient is an appropriate food to include on a low sodium diet  Circle the best answer(s) to the following statements (Hint: there may be more than one correct answer)  11. On a low-sodium diet, some acceptable snack items are:    A. Olives  F. Bean dip   K. Grapefruit juice    B. Salted Pretzels G. Commercial Popcorn   L. Canned peaches    C. Carrot Sticks  H. Bouillon   M. Unsalted nuts   D. [place] fries  I. Peanut butter crackers N. Salami   E. Sweet pickles J. Tomato Juice   O. Pizza  12.  Seasonings that may be used freely on a reduced - sodium diet include   A. Lemon wedges F.Monosodium  glutamate K. Celery seed    B.Soysauce   G. Pepper   L. Mustard powder   C. Sea salt  H. Cooking wine  M. Onion flakes   D. Vinegar  E. Prepared horseradish N. Salsa   E. Sage   J. Worcestershire sauce  O. [id number]         Sumner Boast, PA-C  04/28/2020 2:34 PM    [facility] [address], [place], [place]  [address] Phone: [phone]; Fax: [phone]

## 2020-04-28 ENCOUNTER — Other Ambulatory Visit: Payer: Self-pay

## 2020-04-28 ENCOUNTER — Telehealth: Payer: Self-pay | Admitting: *Deleted

## 2020-04-28 ENCOUNTER — Ambulatory Visit (INDEPENDENT_AMBULATORY_CARE_PROVIDER_SITE_OTHER): Payer: Commercial Managed Care - PPO | Admitting: Physician Assistant

## 2020-04-28 ENCOUNTER — Encounter: Payer: Self-pay | Admitting: Physician Assistant

## 2020-04-28 VITALS — BP 200/100 | HR 101 | Ht 71.0 in | Wt 280.8 lb

## 2020-04-28 DIAGNOSIS — E785 Hyperlipidemia, unspecified: Secondary | ICD-10-CM | POA: Diagnosis not present

## 2020-04-28 DIAGNOSIS — I10 Essential (primary) hypertension: Secondary | ICD-10-CM | POA: Diagnosis not present

## 2020-04-28 DIAGNOSIS — I5033 Acute on chronic diastolic (congestive) heart failure: Secondary | ICD-10-CM

## 2020-04-28 DIAGNOSIS — E1121 Type 2 diabetes mellitus with diabetic nephropathy: Secondary | ICD-10-CM

## 2020-04-28 DIAGNOSIS — G4733 Obstructive sleep apnea (adult) (pediatric): Secondary | ICD-10-CM | POA: Diagnosis not present

## 2020-04-28 MED ORDER — FUROSEMIDE 40 MG PO TABS: 60.0000 mg | ORAL_TABLET | Freq: Two times a day (BID) | ORAL | 3 refills | Status: DC

## 2020-04-28 MED ORDER — LABETALOL HCL 200 MG PO TABS: 200.0000 mg | ORAL_TABLET | Freq: Two times a day (BID) | ORAL | 3 refills | Status: DC

## 2020-04-28 MED ORDER — AMLODIPINE BESYLATE 10 MG PO TABS: 10.0000 mg | ORAL_TABLET | Freq: Every day | ORAL | 3 refills | Status: DC

## 2020-04-28 NOTE — Addendum Note (Signed)
Addended by: Carylon Perches on: 04/28/2020 04:24 PM   Modules accepted: Orders

## 2020-04-28 NOTE — Telephone Encounter (Signed)
[facility] Visit Initial Request  Date of Request ([place]):  April 28, 2020  Requesting Provider:  Estella Husk, [facility]    Agency Requested:    [facility] Contact:  Glory Buff, NP [address] [place], [place] [address] Phone #:  [phone] Fax #:  [phone]  Patient Demographic Information: Name:  [name] Age:  54 y.o.   DOB:  1965/11/26  MRN:  [id number]   Address:   [address] [place] [address]   Phone Numbers:   Home Phone [phone]  Mobile [phone]     Emergency Contact Information on File:   Contact Information    Name Relation Home Work Mobile   Losee,Annette Spouse [phone]        The above family members may be contacted for information on this patient (review DPR on file):  Yes    Patient Clinical Information:  Primary Care Provider:  [name], MD  Primary Cardiologist:  Carlyle Dolly, MD  Primary Electrophysiologist:  None   Past Medical Hx: Mr. Cahoon  has a past medical history of Diabetes mellitus and Hypertension.   Allergies: He has No Known Allergies.   Medications: Current Outpatient Medications on File Prior to Visit  Medication Sig  . amLODipine (NORVASC) 10 MG tablet Take 1 tablet (10 mg total) by mouth daily.  . furosemide (LASIX) 40 MG tablet Take 1.5 tablets (60 mg total) by mouth 2 (two) times daily.  Marland Kitchen glimepiride (AMARYL) 4 MG tablet Take 1 tablet (4 mg total) by mouth daily before breakfast. (Patient taking differently: Take 4 mg by mouth in the morning and at bedtime. )  . hydrALAZINE (APRESOLINE) 100 MG tablet Take 1 tablet (100 mg total) by mouth 3 (three) times daily.  Marland Kitchen labetalol (NORMODYNE) 200 MG tablet Take 1 tablet (200 mg total) by mouth 2 (two) times daily.  . metFORMIN (GLUCOPHAGE-XR) 500 MG 24 hr tablet Take 2 tablets (1,000 mg total) by mouth in the morning and at bedtime.  . Multiple Vitamin (MULTIVITAMIN WITH MINERALS) TABS tablet  Take 1 tablet by mouth daily.  . pravastatin (PRAVACHOL) 40 MG tablet Take 40 mg by mouth daily.   No current facility-administered medications on file prior to visit.     Social Hx: He  reports that he has never smoked. He has never used smokeless tobacco. He reports that he does not drink alcohol and does not use drugs.    Diagnosis/Reason for Visit:   HEART FAILURE  Services Requested:  Vital Signs (BP, Pulse, O2, Weight)  Physical Exam  Medication Reconciliation  HEART FAILURE MANAGEMENT  # of Visits Needed/Frequency per Week: 2 PER WEEK

## 2020-04-28 NOTE — Progress Notes (Signed)
Cardiology Office Note    Date:  05/03/2020   ID:  [name], DOB [birth year], MRN [id number]  PCP:  [name], MD  Cardiologist: Carlyle Dolly, MD EPS: None  No chief complaint on file.   History of Present Illness:  [name] is a 54 y.o. male with past medical history of HTN, HLD, and Type 2 DM   Patient discharged 04/10/2020 from [facility] after admission with acute CHF diuresed over 6 L.  Also had resistant hypertension.  Will need outpatient sleep study and consider renal artery ultrasound if initial work-up benign.  Most recent creatinine 2.12 on 04/10/2020.  Discharged home on Lasix 60 mg daily weight 269 lbs. Echo 04/06/20 EF 50% severe LVH, grade 2 DD  I saw the Patient comes for f/u in [facility] 04/28/20 b/c no appt in [place]. Weight was up to 280 lbs. Swelling about the same. Eating frozen dinners. Complains of 2 pillow orthopnea. Short of breath walking to the mailbox. Didn't take meds and BP high. Was supposed to be on labetalol 200 mg bid and norvasc 10 mg daily and doesn't have.  I resume these medications and increase Lasix to 60 mg twice daily.  Patient comes into the office very dizzy and blurred vision. Blood sugar was 76 this am and only 87 when he got here. Usually runs in 300's. didn't get norvasc and labetolol filled because [facility] told him he had picked it up before and couldn't get it until today.      Past Medical History:  Diagnosis Date  . Diabetes mellitus   . Hypertension     Past Surgical History:  Procedure Laterality Date  . INCISION AND DRAINAGE    . KNEE SURGERY      Current Medications: Current Meds  Medication Sig  . furosemide (LASIX) 40 MG tablet Take 1.5 tablets (60 mg total) by mouth 2 (two) times daily.  Marland Kitchen glimepiride (AMARYL) 4 MG tablet Take 1 tablet (4 mg total) by mouth daily before breakfast. (Patient taking differently: Take 4 mg by mouth in the morning and at bedtime. )  . hydrALAZINE  (APRESOLINE) 100 MG tablet Take 1 tablet (100 mg total) by mouth 3 (three) times daily.  . metFORMIN (GLUCOPHAGE-XR) 500 MG 24 hr tablet Take 2 tablets (1,000 mg total) by mouth in the morning and at bedtime.  . Multiple Vitamin (MULTIVITAMIN WITH MINERALS) TABS tablet Take 1 tablet by mouth daily.  . pravastatin (PRAVACHOL) 40 MG tablet Take 40 mg by mouth daily.     Allergies:   Patient has no known allergies.   Social History   Socioeconomic History  . Marital status: Married    Spouse name: Not on file  . Number of children: Not on file  . Years of education: Not on file  . Highest education level: Not on file  Occupational History  . Not on file  Tobacco Use  . Smoking status: Never Smoker  . Smokeless tobacco: Never Used  Vaping Use  . Vaping Use: Never used  Substance and Sexual Activity  . Alcohol use: No  . Drug use: No  . Sexual activity: Not on file  Other Topics Concern  . Not on file  Social History Narrative  . Not on file   Social Determinants of Health   Financial Resource Strain:   . Difficulty of Paying Living Expenses: Not on file  Food Insecurity:   . Worried About Charity fundraiser in the Last  Year: Not on file  . Ran Out of Food in the Last Year: Not on file  Transportation Needs:   . Lack of Transportation (Medical): Not on file  . Lack of Transportation (Non-Medical): Not on file  Physical Activity:   . Days of Exercise per Week: Not on file  . Minutes of Exercise per Session: Not on file  Stress:   . Feeling of Stress : Not on file  Social Connections:   . Frequency of Communication with Friends and Family: Not on file  . Frequency of Social Gatherings with Friends and Family: Not on file  . Attends Religious Services: Not on file  . Active Member of Clubs or Organizations: Not on file  . Attends Archivist Meetings: Not on file  . Marital Status: Not on file     Family History:  The patient's   family history includes  Dementia in his mother; Diabetes in his father.   ROS:   Please see the history of present illness.    ROS All other systems reviewed and are negative.   PHYSICAL EXAM:   VS:  BP (!) 176/94   Pulse (!) 105   Ht 5\' 11"  (1.803 m)   Wt 279 lb (126.6 kg)   BMI 38.91 kg/m   Physical Exam  GEN: Obese, in no acute distress  HEENT: normal  Neck: no JVD, carotid bruits, or masses Cardiac:RRR; no murmurs, rubs, or gallops  Respiratory:  clear to auscultation bilaterally, normal work of breathing GI: soft, nontender, nondistended, + BS Ext: without cyanosis, clubbing, or edema, Good distal pulses bilaterally MS: no deformity or atrophy  Skin: warm and dry, no rash Neuro:  Alert and Oriented x 3, Strength and sensation are intact Psych: euthymic mood, full affect  Wt Readings from Last 3 Encounters:  05/03/20 279 lb (126.6 kg)  04/28/20 280 lb 12.8 oz (127.4 kg)  04/10/20 270 lb 4.5 oz (122.6 kg)      Studies/Labs Reviewed:   EKG:  EKG is ordered today.  The ekg ordered today demonstrates Sinus tachycardia 103/m LPFB  Recent Labs: 04/05/2020: ALT 35; B Natriuretic Peptide 311.0 04/07/2020: Hemoglobin 10.8; Platelets 269 04/08/2020: Magnesium 1.7 04/10/2020: TSH 2.318 04/28/2020: BUN 34; Creatinine, Ser 1.55; Potassium 4.4; Sodium 145   Lipid Panel    Component Value Date/Time   CHOL 161 04/07/2020 0602   TRIG 80 04/07/2020 0602   HDL 58 04/07/2020 0602   CHOLHDL 2.8 04/07/2020 0602   VLDL 16 04/07/2020 0602   LDLCALC 87 04/07/2020 0602    Additional studies/ records that were reviewed today include:  04/06/2020  IMPRESSIONS     1. Left ventricular ejection fraction, by estimation, is 50%. The left  ventricle has normal function. The left ventricle has no regional wall  motion abnormalities. There is severe left ventricular hypertrophy. Left  ventricular diastolic parameters are  consistent with Grade II diastolic dysfunction (pseudonormalization).  Elevated left  atrial pressure.   2. Right ventricular systolic function is normal. The right ventricular  size is normal.   3. Left atrial size was mildly dilated.   4. Large pleural effusion in the left lateral region.   5. The mitral valve is normal in structure. Trivial mitral valve  regurgitation. No evidence of mitral stenosis.   6. The aortic valve is tricuspid. Aortic valve regurgitation is not  visualized. No aortic stenosis is present.   7. The inferior vena cava is normal in size with greater than 50%  respiratory  variability, suggesting right atrial pressure of 3 mmHg.   FINDINGS   Left Ventricle: Left ventricular ejection fraction, by estimation, is  50%. The left ventricle has normal function. The left ventricle has no  regional wall motion abnormalities. The left ventricular internal cavity  size was normal in size. There is  severe left ventricular hypertrophy. Left ventricular diastolic parameters  are consistent with Grade II diastolic dysfunction (pseudonormalization).  Elevated left atrial pressure.   Right Ventricle: The right ventricular size is normal. No increase in  right ventricular wall thickness. Right ventricular systolic function is  normal.   Left Atrium: Left atrial size was mildly dilated.   Right Atrium: Right atrial size was normal in size.   Pericardium: There is no evidence of pericardial effusion.   Mitral Valve: The mitral valve is normal in structure. Trivial mitral  valve regurgitation. No evidence of mitral valve stenosis.   Tricuspid Valve: The tricuspid valve is normal in structure. Tricuspid  valve regurgitation is not demonstrated. No evidence of tricuspid  stenosis.   Aortic Valve: The aortic valve is tricuspid. Aortic valve regurgitation is  not visualized. No aortic stenosis is present. Aortic valve mean gradient  measures 2.0 mmHg. Aortic valve peak gradient measures 3.9 mmHg. Aortic  valve area, by VTI measures 2.73  cm.   Pulmonic  Valve: The pulmonic valve was not well visualized. Pulmonic valve  regurgitation is not visualized. No evidence of pulmonic stenosis.   Aorta: The aortic root is normal in size and structure.   Pulmonary Artery: Indeterminant PASP, inadequate TR jet.   Venous: The inferior vena cava is normal in size with greater than 50%  respiratory variability, suggesting right atrial pressure of 3 mmHg.   IAS/Shunts: No atrial level shunt detected by color flow Doppler.   Additional Comments: There is a large pleural effusion in the left lateral  region.      ASSESSMENT:    1. Acute on chronic diastolic CHF (congestive heart failure) (HCC)      PLAN:  In order of problems listed above:  Acute diastolic HF - Echocardiogram shows a low-normal EF of 50% with no regional WMA. Noted to have severe LVH and Grade 2 DD.  neg 6.5 L during hospitalization.   Discharge weight 269 pounds.  Patient's weight was up to 280 pounds last Wed and 279 lbs today. Has trouble urinating..  Blood pressure has been running high.  Still hasn't gotten labetalol or amlodipine filled. I Increased Lasix to 60 mg twice daily but not putting much out. 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 Medication options limited due to renal dysfunction -Was supposed to be on norvasc 10mg  daily,  labetalol 200mg  bid in addition to hydralazine but has not gotten these  filled. 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 Sugars running in the high 300's but 78 this am with blurred vision-has come up to 100 after coke in the office. Still feeling poorly-going to ED.       Medication Adjustments/Labs and Tests Ordered: Current medicines are reviewed at length with the patient today.  Concerns regarding  medicines are outlined above.  Medication changes, Labs and Tests ordered today are listed in  the Patient Instructions below. There are no Patient Instructions on file for this visit.   Sumner Boast, PA-C  05/03/2020 12:17 PM    [facility] [place], [place], [place]  [address] Phone: [phone]; Fax: [phone]

## 2020-04-28 NOTE — Patient Instructions (Addendum)
Medication Instructions:  Your physician has recommended you make the following change in your medication:   START: Labetalol 200mg  twice daily START: Amlodipine 10mg  daily INCREASE: Furosemide to 60mg  Twice daily  *If you need a refill on your cardiac medications before your next appointment, please call your pharmacy*   Lab Work: TODAY: BMET  If you have labs (blood work) drawn today and your tests are completely normal, you will receive your results only by: Marland Kitchen MyChart Message (if you have MyChart) OR . A paper copy in the mail If you have any lab test that is abnormal or we need to change your treatment, we will call you to review the results.   Testing/Procedures: None   Follow-Up: At [facility], you and your health needs are our priority.  As part of our continuing mission to provide you with exceptional heart care, we have created designated Provider Care Teams.  These Care Teams include your primary Cardiologist (physician) and Advanced Practice Providers (APPs -  Physician Assistants and Nurse Practitioners) who all work together to provide you with the care you need, when you need it.  We recommend signing up for the patient portal called "MyChart".  Sign up information is provided on this After Visit Summary.  MyChart is used to connect with patients for Virtual Visits (Telemedicine).  Patients are able to view lab/test results, encounter notes, upcoming appointments, etc.  Non-urgent messages can be sent to your provider as well.   To learn more about what you can do with MyChart, go to [email or web address].    Your next appointment:    05/03/2020 @ 1:30  The format for your next appointment:   In Person  Provider:   Ermalinda Barrios, PA-C   Two Gram Sodium Diet 2000 mg  What is Sodium? Sodium is a mineral found naturally in many foods. The most significant source of sodium in the diet is table salt, which is about 40% sodium.  Processed, convenience, and  preserved foods also contain a large amount of sodium.  The body needs only 500 mg of sodium daily to function,  A normal diet provides more than enough sodium even if you do not use salt.  Why Limit Sodium? A build up of sodium in the body can cause thirst, increased blood pressure, shortness of breath, and water retention.  Decreasing sodium in the diet can reduce edema and risk of heart attack or stroke associated with high blood pressure.  Keep in mind that there are many other factors involved in these health problems.  Heredity, obesity, lack of exercise, cigarette smoking, stress and what you eat all play a role.  General Guidelines:  Do not add salt at the table or in cooking.  One teaspoon of salt contains over 2 grams of sodium.  Read food labels  Avoid processed and convenience foods  Ask your dietitian before eating any foods not dicussed in the menu planning guidelines  Consult your physician if you wish to use a salt substitute or a sodium containing medication such as antacids.  Limit milk and milk products to 16 oz (2 cups) per day.  Shopping Hints:  READ LABELS!! "Dietetic" does not necessarily mean low sodium.  Salt and other sodium ingredients are often added to foods during processing.   Menu Planning Guidelines Food Group Choose More Often Avoid  Beverages (see also the milk group All fruit juices, low-sodium, salt-free vegetables juices, low-sodium carbonated beverages Regular vegetable or tomato juices, commercially softened water used for  drinking or cooking  Breads and Cereals Enriched white, wheat, rye and pumpernickel bread, hard rolls and dinner rolls; muffins, cornbread and waffles; most dry cereals, cooked cereal without added salt; unsalted crackers and breadsticks; low sodium or homemade bread crumbs Bread, rolls and crackers with salted tops; quick breads; instant hot cereals; pancakes; commercial bread stuffing; self-rising flower and biscuit mixes; regular  bread crumbs or cracker crumbs  Desserts and Sweets Desserts and sweets mad with mild should be within allowance Instant pudding mixes and cake mixes  Fats Butter or margarine; vegetable oils; unsalted salad dressings, regular salad dressings limited to 1 Tbs; light, sour and heavy cream Regular salad dressings containing bacon fat, bacon bits, and salt pork; snack dips made with instant soup mixes or processed cheese; salted nuts  Fruits Most fresh, frozen and canned fruits Fruits processed with salt or sodium-containing ingredient (some dried fruits are processed with sodium sulfites        Vegetables Fresh, frozen vegetables and low- sodium canned vegetables Regular canned vegetables, sauerkraut, pickled vegetables, and others prepared in brine; frozen vegetables in sauces; vegetables seasoned with ham, bacon or salt pork  Condiments, Sauces, Miscellaneous  Salt substitute with physician's approval; pepper, herbs, spices; vinegar, lemon or lime juice; hot pepper sauce; garlic powder, onion powder, low sodium soy sauce (1 Tbs.); low sodium condiments (ketchup, chili sauce, mustard) in limited amounts (1 tsp.) fresh ground horseradish; unsalted tortilla chips, pretzels, potato chips, popcorn, salsa (1/4 cup) Any seasoning made with salt including garlic salt, celery salt, onion salt, and seasoned salt; sea salt, rock salt, kosher salt; meat tenderizers; monosodium glutamate; mustard, regular soy sauce, barbecue, sauce, chili sauce, teriyaki sauce, steak sauce, Worcestershire sauce, and most flavored vinegars; canned gravy and mixes; regular condiments; salted snack foods, olives, picles, relish, horseradish sauce, catsup   Food preparation: Try these seasonings Meats:    Pork Sage, onion Serve with applesauce  Chicken Poultry seasoning, thyme, parsley Serve with cranberry sauce  Lamb Curry powder, rosemary, garlic, thyme Serve with mint sauce or jelly  Veal Marjoram, basil Serve with current  jelly, cranberry sauce  Beef Pepper, bay leaf Serve with dry mustard, unsalted chive butter  Fish [place] leaf, dill Serve with unsalted lemon butter, unsalted parsley butter  Vegetables:    Asparagus Lemon juice   Broccoli Lemon juice   Carrots Mustard dressing parsley, mint, nutmeg, glazed with unsalted butter and sugar   Green beans Marjoram, lemon juice, nutmeg,dill seed   Tomatoes Basil, marjoram, onion   Spice /blend for [employer]" 4 tsp ground thyme 1 tsp ground sage 3 tsp ground rosemary 4 tsp ground marjoram   Test your knowledge 1. A product that says "Salt Free" may still contain sodium. True or False 2. Garlic Powder and Hot Pepper Sauce an be used as alternative seasonings.True or False 3. Processed foods have more sodium than fresh foods.  True or False 4. Canned Vegetables have less sodium than froze True or False  WAYS TO DECREASE YOUR SODIUM INTAKE 1. Avoid the use of added salt in cooking and at the table.  Table salt (and other prepared seasonings which contain salt) is probably one of the greatest sources of sodium in the diet.  Unsalted foods can gain flavor from the sweet, sour, and butter taste sensations of herbs and spices.  Instead of using salt for seasoning, try the following seasonings with the foods listed.  Remember: how you use them to enhance natural food flavors is limited only by your  creativity... Allspice-Meat, fish, eggs, fruit, peas, red and yellow vegetables Almond Extract-Fruit baked goods Anise Seed-Sweet breads, fruit, carrots, beets, cottage cheese, cookies (tastes like licorice) Basil-Meat, fish, eggs, vegetables, rice, vegetables salads, soups, sauces Bay Leaf-Meat, fish, stews, poultry Burnet-Salad, vegetables (cucumber-like flavor) Caraway Seed-Bread, cookies, cottage cheese, meat, vegetables, cheese, rice Cardamon-Baked goods, fruit, soups Celery Powder or seed-Salads, salad dressings, sauces, meatloaf, soup, bread.Do not use  celery  salt Chervil-Meats, salads, fish, eggs, vegetables, cottage cheese (parsley-like flavor) Chili Power-Meatloaf, chicken cheese, corn, eggplant, egg dishes Chives-Salads cottage cheese, egg dishes, soups, vegetables, sauces Cilantro-Salsa, casseroles Cinnamon-Baked goods, fruit, pork, lamb, chicken, carrots Cloves-Fruit, baked goods, fish, pot roast, green beans, beets, carrots Coriander-Pastry, cookies, meat, salads, cheese (lemon-orange flavor) Cumin-Meatloaf, fish,cheese, eggs, cabbage,fruit pie (caraway flavor) [employer], fruit, eggs, fish, poultry, cottage cheese, vegetables Dill Seed-Meat, cottage cheese, poultry, vegetables, fish, salads, bread Fennel Seed-Bread, cookies, apples, pork, eggs, fish, beets, cabbage, cheese, Licorice-like flavor Garlic-(buds or powder) Salads, meat, poultry, fish, bread, butter, vegetables, potatoes.Do not  use garlic salt Ginger-Fruit, vegetables, baked goods, meat, fish, poultry Horseradish Root-Meet, vegetables, butter Lemon Juice or Extract-Vegetables, fruit, tea, baked goods, fish salads Mace-Baked goods fruit, vegetables, fish, poultry (taste like nutmeg) Maple Extract-Syrups Marjoram-Meat, chicken, fish, vegetables, breads, green salads (taste like Sage) Mint-Tea, lamb, sherbet, vegetables, desserts, carrots, cabbage Mustard, Dry or Seed-Cheese, eggs, meats, vegetables, poultry Nutmeg-Baked goods, fruit, chicken, eggs, vegetables, desserts Onion Powder-Meat, fish, poultry, vegetables, cheese, eggs, bread, rice salads (Do not use   Onion salt) Orange Extract-Desserts, baked goods Oregano-Pasta, eggs, cheese, onions, pork, lamb, fish, chicken, vegetables, green salads Paprika-Meat, fish, poultry, eggs, cheese, vegetables Parsley Flakes-Butter, vegetables, meat fish, poultry, eggs, bread, salads (certain forms may   Contain sodium Pepper-Meat fish, poultry, vegetables, eggs Peppermint Extract-Desserts, baked goods Poppy Seed-Eggs, bread,  cheese, fruit dressings, baked goods, noodles, vegetables, cottage  [employer], poultry, meat, fish, cauliflower, turnips,eggs bread Saffron-Rice, bread, veal, chicken, fish, eggs Sage-Meat, fish, poultry, onions, eggplant, tomateos, pork, stews Savory-Eggs, salads, poultry, meat, rice, vegetables, soups, pork Tarragon-Meat, poultry, fish, eggs, butter, vegetables (licorice-like flavor)  Thyme-Meat, poultry, fish, eggs, vegetables, (clover-like flavor), sauces, soups Tumeric-Salads, butter, eggs, fish, rice, vegetables (saffron-like flavor) Vanilla Extract-Baked goods, candy Vinegar-Salads, vegetables, meat marinades Walnut Extract-baked goods, candy  2. Choose your Foods Wisely   The following is a list of foods to avoid which are high in sodium:  Meats-Avoid all smoked, canned, salt cured, dried and kosher meat and fish as well as Anchovies   [employer] meats:Bologna, Liverwurst, Pastrami Canned meat or fish  Marinated herring Caviar    Pepperoni Corned Beef   Pizza Dried chipped beef  Salami Frozen breaded fish or meat Salt pork Frankfurters or hot dogs  Sardines Gefilte fish   Sausage Ham (boiled ham, Proscuitto Smoked butt    spiced ham)   Spam      TV Dinners Vegetables Canned vegetables (Regular) Relish Canned mushrooms  Sauerkraut Olives    Tomato juice Pickles  Bakery and Dessert Products Canned puddings  Cream pies Cheesecake   Decorated cakes Cookies  Beverages/Juices Tomato juice, regular  Gatorade   V-8 vegetable juice, regular  Breads and Cereals Biscuit mixes   Salted potato chips, corn chips, pretzels Bread stuffing mixes  Salted crackers and rolls Pancake and waffle mixes Self-rising flour  Seasonings Accent    Meat sauces Barbecue sauce  Meat tenderizer Catsup    Monosodium glutamate (MSG) Celery salt   Onion salt Chili sauce  Prepared mustard Garlic salt   Salt, seasoned salt, sea  salt Gravy mixes   Soy sauce Horseradish   Steak sauce Ketchup   Tartar sauce Lite salt    Teriyaki sauce Marinade mixes   Worcestershire sauce  Others Baking powder   Cocoa and cocoa mixes Baking soda   Commercial casserole mixes Candy-caramels, chocolate  Dehydrated soups    Bars, fudge,nougats  Instant rice and pasta mixes Canned broth or soup  Maraschino cherries Cheese, aged and processed cheese and cheese spreads  Learning Assessment Quiz  Indicated T (for True) or F (for False) for each of the following statements:  1. _____ Fresh fruits and vegetables and unprocessed grains are generally low in sodium 2. _____ Water may contain a considerable amount of sodium, depending on the source 3. _____ You can always tell if a food is high in sodium by tasting it 4. _____ Certain laxatives my be high in sodium and should be avoided unless prescribed   by a physician or pharmacist 5. _____ Salt substitutes may be used freely by anyone on a sodium restricted diet 6. _____ Sodium is present in table salt, food additives and as a natural component of   most foods 7. _____ Table salt is approximately 90% sodium 8. _____ Limiting sodium intake may help prevent excess fluid accumulation in the body 9. _____ On a sodium-restricted diet, seasonings such as bouillon soy sauce, and    cooking wine should be used in place of table salt 10. _____ On an ingredient list, a product which lists monosodium glutamate as the first   ingredient is an appropriate food to include on a low sodium diet  Circle the best answer(s) to the following statements (Hint: there may be more than one correct answer)  11. On a low-sodium diet, some acceptable snack items are:    A. Olives  F. Bean dip   K. Grapefruit juice    B. Salted Pretzels G. Commercial Popcorn   L. Canned peaches    C. Carrot Sticks  H. Bouillon   M. Unsalted nuts   D. [place] fries  I. Peanut butter crackers N. Salami   E. Sweet pickles J.  Tomato Juice   O. Pizza  12.  Seasonings that may be used freely on a reduced - sodium diet include   A. Lemon wedges F.Monosodium glutamate K. Celery seed    B.Soysauce   G. Pepper   L. Mustard powder   C. Sea salt  H. Cooking wine  M. Onion flakes   D. Vinegar  E. Prepared horseradish N. Salsa   E. Sage   J. Worcestershire sauce  O. Chutney

## 2020-04-28 NOTE — Telephone Encounter (Signed)
Per April G. Covering CMA for Ermalinda Barrios, [facility] we are cancelling the [place] with [facility]. I will send notes to Laure Kidney, NP. If any questions please call the office.

## 2020-04-29 LAB — BASIC METABOLIC PANEL
BUN/Creatinine Ratio: 22 — ABNORMAL HIGH (ref 9–20)
BUN: 34 mg/dL — ABNORMAL HIGH (ref 6–24)
CO2: 28 mmol/L (ref 20–29)
Calcium: 8.4 mg/dL — ABNORMAL LOW (ref 8.7–10.2)
Chloride: 106 mmol/L (ref 96–106)
Creatinine, Ser: 1.55 mg/dL — ABNORMAL HIGH (ref 0.76–1.27)
GFR calc Af Amer: 57 mL/min/{1.73_m2} — ABNORMAL LOW (ref >59)
GFR calc non Af Amer: 50 mL/min/{1.73_m2} — ABNORMAL LOW (ref >59)
Glucose: 176 mg/dL — ABNORMAL HIGH (ref 65–99)
Potassium: 4.4 mmol/L (ref 3.5–5.2)
Sodium: 145 mmol/L — ABNORMAL HIGH (ref 134–144)

## 2020-05-03 ENCOUNTER — Emergency Department (HOSPITAL_COMMUNITY): Payer: Commercial Managed Care - PPO

## 2020-05-03 ENCOUNTER — Inpatient Hospital Stay (HOSPITAL_COMMUNITY)
Admission: EM | Admit: 2020-05-03 | Discharge: 2020-05-07 | DRG: 291 | Disposition: A | Discharge status: Home / Self Care | Payer: Commercial Managed Care - PPO | Source: Ambulatory Visit | Attending: Internal Medicine | Admitting: Internal Medicine

## 2020-05-03 ENCOUNTER — Encounter (HOSPITAL_COMMUNITY): Payer: Self-pay

## 2020-05-03 ENCOUNTER — Ambulatory Visit (INDEPENDENT_AMBULATORY_CARE_PROVIDER_SITE_OTHER): Payer: Commercial Managed Care - PPO | Admitting: Physician Assistant

## 2020-05-03 ENCOUNTER — Encounter: Payer: Self-pay | Admitting: Physician Assistant

## 2020-05-03 ENCOUNTER — Other Ambulatory Visit: Payer: Self-pay

## 2020-05-03 VITALS — BP 176/94 | HR 105 | Ht 71.0 in | Wt 279.0 lb

## 2020-05-03 DIAGNOSIS — Z91128 Patient's intentional underdosing of medication regimen for other reason: Secondary | ICD-10-CM

## 2020-05-03 DIAGNOSIS — I509 Heart failure, unspecified: Secondary | ICD-10-CM | POA: Diagnosis not present

## 2020-05-03 DIAGNOSIS — Z9111 Patient's noncompliance with dietary regimen: Secondary | ICD-10-CM

## 2020-05-03 DIAGNOSIS — I5033 Acute on chronic diastolic (congestive) heart failure: Secondary | ICD-10-CM | POA: Diagnosis present

## 2020-05-03 DIAGNOSIS — Z20822 Contact with and (suspected) exposure to covid-19: Secondary | ICD-10-CM | POA: Diagnosis present

## 2020-05-03 DIAGNOSIS — T461X6A Underdosing of calcium-channel blockers, initial encounter: Secondary | ICD-10-CM | POA: Diagnosis present

## 2020-05-03 DIAGNOSIS — R600 Localized edema: Secondary | ICD-10-CM | POA: Diagnosis present

## 2020-05-03 DIAGNOSIS — N183 Chronic kidney disease, stage 3 unspecified: Secondary | ICD-10-CM | POA: Diagnosis present

## 2020-05-03 DIAGNOSIS — J9811 Atelectasis: Secondary | ICD-10-CM | POA: Diagnosis present

## 2020-05-03 DIAGNOSIS — Z833 Family history of diabetes mellitus: Secondary | ICD-10-CM | POA: Diagnosis not present

## 2020-05-03 DIAGNOSIS — I10 Essential (primary) hypertension: Secondary | ICD-10-CM | POA: Diagnosis not present

## 2020-05-03 DIAGNOSIS — Z6838 Body mass index (BMI) 38.0-38.9, adult: Secondary | ICD-10-CM | POA: Diagnosis not present

## 2020-05-03 DIAGNOSIS — E1122 Type 2 diabetes mellitus with diabetic chronic kidney disease: Secondary | ICD-10-CM | POA: Diagnosis present

## 2020-05-03 DIAGNOSIS — Z79899 Other long term (current) drug therapy: Secondary | ICD-10-CM

## 2020-05-03 DIAGNOSIS — E1121 Type 2 diabetes mellitus with diabetic nephropathy: Secondary | ICD-10-CM | POA: Diagnosis not present

## 2020-05-03 DIAGNOSIS — E785 Hyperlipidemia, unspecified: Secondary | ICD-10-CM | POA: Diagnosis present

## 2020-05-03 DIAGNOSIS — I161 Hypertensive emergency: Secondary | ICD-10-CM | POA: Diagnosis present

## 2020-05-03 DIAGNOSIS — I130 Hypertensive heart and chronic kidney disease with heart failure and stage 1 through stage 4 chronic kidney disease, or unspecified chronic kidney disease: Principal | ICD-10-CM | POA: Diagnosis present

## 2020-05-03 DIAGNOSIS — T448X6A Underdosing of centrally-acting and adrenergic-neuron-blocking agents, initial encounter: Secondary | ICD-10-CM | POA: Diagnosis present

## 2020-05-03 DIAGNOSIS — H547 Unspecified visual loss: Secondary | ICD-10-CM | POA: Diagnosis present

## 2020-05-03 DIAGNOSIS — E162 Hypoglycemia, unspecified: Secondary | ICD-10-CM | POA: Diagnosis not present

## 2020-05-03 DIAGNOSIS — I160 Hypertensive urgency: Secondary | ICD-10-CM | POA: Diagnosis not present

## 2020-05-03 DIAGNOSIS — Z7984 Long term (current) use of oral hypoglycemic drugs: Secondary | ICD-10-CM | POA: Diagnosis not present

## 2020-05-03 DIAGNOSIS — H538 Other visual disturbances: Secondary | ICD-10-CM | POA: Diagnosis not present

## 2020-05-03 DIAGNOSIS — E1165 Type 2 diabetes mellitus with hyperglycemia: Secondary | ICD-10-CM | POA: Diagnosis present

## 2020-05-03 DIAGNOSIS — H539 Unspecified visual disturbance: Secondary | ICD-10-CM | POA: Diagnosis not present

## 2020-05-03 DIAGNOSIS — N1831 Chronic kidney disease, stage 3a: Secondary | ICD-10-CM | POA: Diagnosis present

## 2020-05-03 HISTORY — DX: Heart failure, unspecified: I50.9

## 2020-05-03 LAB — BASIC METABOLIC PANEL
Anion gap: 11 (ref 5–15)
BUN: 44 mg/dL — ABNORMAL HIGH (ref 6–20)
CO2: 27 mmol/L (ref 22–32)
Calcium: 8.3 mg/dL — ABNORMAL LOW (ref 8.9–10.3)
Chloride: 103 mmol/L (ref 98–111)
Creatinine, Ser: 1.89 mg/dL — ABNORMAL HIGH (ref 0.61–1.24)
GFR, Estimated: 39 mL/min — ABNORMAL LOW (ref >60)
Glucose, Bld: 116 mg/dL — ABNORMAL HIGH (ref 70–99)
Potassium: 4 mmol/L (ref 3.5–5.1)
Sodium: 141 mmol/L (ref 135–145)

## 2020-05-03 LAB — BRAIN NATRIURETIC PEPTIDE: B Natriuretic Peptide: 229 pg/mL — ABNORMAL HIGH (ref 0.0–100.0)

## 2020-05-03 LAB — CBC
HCT: 33.8 % — ABNORMAL LOW (ref 39.0–52.0)
Hemoglobin: 11 g/dL — ABNORMAL LOW (ref 13.0–17.0)
MCH: 26.6 pg (ref 26.0–34.0)
MCHC: 32.5 g/dL (ref 30.0–36.0)
MCV: 81.8 fL (ref 80.0–100.0)
Platelets: 257 10*3/uL (ref 150–400)
RBC: 4.13 MIL/uL — ABNORMAL LOW (ref 4.22–5.81)
RDW: 14.1 % (ref 11.5–15.5)
WBC: 6.1 10*3/uL (ref 4.0–10.5)
nRBC: 0 % (ref 0.0–0.2)

## 2020-05-03 LAB — TROPONIN I (HIGH SENSITIVITY)
Troponin I (High Sensitivity): 11 ng/L (ref <18)
Troponin I (High Sensitivity): 13 ng/L (ref <18)

## 2020-05-03 LAB — APTT: aPTT: 30 seconds (ref 24–36)

## 2020-05-03 LAB — PROTIME-INR
INR: 1 (ref 0.8–1.2)
Prothrombin Time: 12.8 seconds (ref 11.4–15.2)

## 2020-05-03 LAB — GLUCOSE, CAPILLARY: Glucose-Capillary: 147 mg/dL — ABNORMAL HIGH (ref 70–99)

## 2020-05-03 LAB — RESPIRATORY PANEL BY RT PCR (FLU A&B, COVID)
Influenza A by PCR: NEGATIVE
Influenza B by PCR: NEGATIVE
SARS Coronavirus 2 by RT PCR: NEGATIVE

## 2020-05-03 LAB — CBG MONITORING, ED: Glucose-Capillary: 121 mg/dL — ABNORMAL HIGH (ref 70–99)

## 2020-05-03 MED ORDER — ADULT MULTIVITAMIN W/MINERALS CH
1.0000 | ORAL_TABLET | Freq: Every day | ORAL | Status: DC
  Administered 2020-05-04 – 2020-05-07 (×4): 1 via ORAL
  Filled 2020-05-03 (×4): qty 1

## 2020-05-03 MED ORDER — LABETALOL HCL 200 MG PO TABS
200.0000 mg | ORAL_TABLET | Freq: Two times a day (BID) | ORAL | Status: DC
  Administered 2020-05-03 – 2020-05-07 (×8): 200 mg via ORAL
  Filled 2020-05-03 (×8): qty 1

## 2020-05-03 MED ORDER — SODIUM CHLORIDE 0.9% FLUSH
3.0000 mL | Freq: Two times a day (BID) | INTRAVENOUS | Status: DC
  Administered 2020-05-03 – 2020-05-07 (×8): 3 mL via INTRAVENOUS

## 2020-05-03 MED ORDER — ENOXAPARIN SODIUM 60 MG/0.6ML ~~LOC~~ SOLN
60.0000 mg | SUBCUTANEOUS | Status: DC
  Administered 2020-05-03 – 2020-05-06 (×5): 60 mg via SUBCUTANEOUS
  Filled 2020-05-03 (×6): qty 0.6

## 2020-05-03 MED ORDER — HYDRALAZINE HCL 25 MG PO TABS
100.0000 mg | ORAL_TABLET | Freq: Three times a day (TID) | ORAL | Status: DC
  Administered 2020-05-03 – 2020-05-07 (×11): 100 mg via ORAL
  Filled 2020-05-03: qty 4
  Filled 2020-05-03: qty 2
  Filled 2020-05-03: qty 4
  Filled 2020-05-03 (×3): qty 2
  Filled 2020-05-03 (×7): qty 4
  Filled 2020-05-03: qty 2
  Filled 2020-05-03 (×2): qty 4

## 2020-05-03 MED ORDER — ACETAMINOPHEN 325 MG PO TABS: 650.0000 mg | ORAL_TABLET | ORAL | Status: DC | PRN

## 2020-05-03 MED ORDER — AMLODIPINE BESYLATE 5 MG PO TABS
10.0000 mg | ORAL_TABLET | Freq: Every day | ORAL | Status: DC
  Administered 2020-05-03 – 2020-05-07 (×5): 10 mg via ORAL
  Filled 2020-05-03 (×5): qty 2

## 2020-05-03 MED ORDER — SODIUM CHLORIDE 0.9 % IV SOLN: 250.0000 mL | INTRAVENOUS | Status: DC | PRN

## 2020-05-03 MED ORDER — PRAVASTATIN SODIUM 40 MG PO TABS
40.0000 mg | ORAL_TABLET | Freq: Every morning | ORAL | Status: DC
  Administered 2020-05-05 – 2020-05-07 (×3): 40 mg via ORAL
  Filled 2020-05-03: qty 1

## 2020-05-03 MED ORDER — SODIUM CHLORIDE 0.9% FLUSH: 3.0000 mL | INTRAVENOUS | Status: DC | PRN

## 2020-05-03 MED ORDER — ONDANSETRON HCL 4 MG/2ML IJ SOLN: 4.0000 mg | Freq: Four times a day (QID) | INTRAMUSCULAR | Status: DC | PRN

## 2020-05-03 MED ORDER — FUROSEMIDE 10 MG/ML IJ SOLN
60.0000 mg | Freq: Two times a day (BID) | INTRAMUSCULAR | Status: DC
  Administered 2020-05-03 – 2020-05-05 (×4): 60 mg via INTRAVENOUS
  Filled 2020-05-03 (×4): qty 6

## 2020-05-03 NOTE — ED Triage Notes (Signed)
Pt brought over by cardiac rehab. Reports CP last night which has resolved . Pt states he woke up with blurry vision  And unstable gait. Pt reports 75 cbg and it is normally in the 300's He ate 2 deviled eggs and then drove himself to cardiac rehab Pt seemed to be confused to staff and CBG was then 88 and given candy and 1/2 soda. Pt states he feels anxious

## 2020-05-03 NOTE — ED Notes (Signed)
Pt states vision back to normal, denies any blurry vision at this time

## 2020-05-03 NOTE — H&P (Signed)
[facility] History & Physical  [name], hospital MD  [name] 05/03/2020  Chief Complaint: Blurry vision HPI: The patient is a 54 y.o. year-old w/ hx of HTN, acute diast CHF, CKD 3a, HL presented to ED sent from cardiology office due to blurred vision and leg swelling.  Pt dc'd 3 wks ago after diast CHF admisison, dc'd at 269 lbs on lasix 60 qd. He went to see his cardiologist this am, wt up to 280 lbs, +2 pillow orthopnea, leg swelling significant. SOB walking up stairs or to the mailbox. Was supposed to on labetalol and norvasc but hasn't gotten those filled yet since his discharge. He also was c/o blurred vision onset this am, lasted a few hours. BS was 76. In ED CXR read as no active disease, BP's high 176/94, SpO2 91 % on room air. RR 22, HR 101. No visual c/o's here.  CT head negative.    Pt was admitted here from 9/13- 04/10/20 for bilat LE edema, HTN, DM2/ CKD 3a and acute diast CHF. Pt had HTN'sive urgency on admit. Echo showed G2DD and HHF, EF 50%.  dc'd home on lasix 60 qd.   Pt seen in ED. Blurred vision is better, was in both eyes, no assoc arm/ leg weakness, swallowing issues. Legs are swollen, not much different than his last hosp admission he says. No chest pain, fevers, prod cough, no abd pain or n/v/d.  Voiding w/o difficulty.     ROS  denies CP  no joint pain   no HA  no blurry vision  no rash  no diarrhea  no nausea/ vomiting  no dysuria  no difficulty voiding  no change in urine color   Past Medical History  Past Medical History:  Diagnosis Date  . CHF (congestive heart failure) ([place])   . Diabetes mellitus   . Hypertension    Past Surgical History  Past Surgical History:  Procedure Laterality Date  . INCISION AND DRAINAGE    . KNEE SURGERY     Family History  Family History  Problem Relation Age of Onset  . Dementia Mother   . Diabetes Father    Social History  reports that he has never smoked. He has never used smokeless tobacco. He  reports that he does not drink alcohol and does not use drugs. Allergies No Known Allergies Home medications Prior to Admission medications   Medication Sig Start Date End Date Taking? Authorizing Provider  furosemide (LASIX) 40 MG tablet Take 1.5 tablets (60 mg total) by mouth 2 (two) times daily. 04/28/20 07/27/20 Yes Imogene Burn, PA-C  glimepiride (AMARYL) 4 MG tablet Take 1 tablet (4 mg total) by mouth daily before breakfast. Patient taking differently: Take 4 mg by mouth in the morning and at bedtime.  07/24/11 05/03/20 Yes [name], MD  hydrALAZINE (APRESOLINE) 100 MG tablet Take 1 tablet (100 mg total) by mouth 3 (three) times daily. 04/10/20  Yes Barton Dubois, MD  metFORMIN (GLUCOPHAGE-XR) 500 MG 24 hr tablet Take 2 tablets (1,000 mg total) by mouth in the morning and at bedtime. 04/10/20  Yes Barton Dubois, MD  Multiple Vitamin (MULTIVITAMIN WITH MINERALS) TABS tablet Take 1 tablet by mouth daily.   Yes [provider]  pravastatin (PRAVACHOL) 40 MG tablet Take 40 mg by mouth in the morning.    Yes [provider]  amLODipine (NORVASC) 10 MG tablet Take 1 tablet (10 mg total) by mouth daily. Patient not taking: Reported on 05/03/2020 04/28/20  Imogene Burn, PA-C  labetalol (NORMODYNE) 200 MG tablet Take 1 tablet (200 mg total) by mouth 2 (two) times daily. Patient not taking: Reported on 05/03/2020 04/28/20   Imogene Burn, PA-C       Exam Gen alert, lying at 30 deg, no distress No rash, cyanosis or gangrene Sclera anicteric, throat clear No jvd or bruit Chest bilat rales 1/3 up post, no wheezing, no distress RRR +S4 gallop, no MR Abd soft ntnd no mass or ascites +bs GU normal male MS no joint effusions or deformity Ext diffuse bilat 2-3+ LE edema from feet to above the knees Neuro is alert, Ox 3 , nf    Home meds:  - lasix 60  bid/ hydralazine 100 tid/ amlodipine 10 qd/ labetalol 20 bid  - pravachol 40 am  - metformin 1 gm bid/ amaryl  4 mg bid  - prn's/ vitamins/ supplements   CXR - IMPRESSION: Improvement in bibasilar atelectasis and effusion. Negative for edema.  EKG - Sinus tachycardia Right axis deviation Borderline T wave abnormalities  Echo 04/06/20 - severe LVH , EV 50%, G2DD, no valve disease, RV ok  Na 141 K 4.0 CO2 27 BN 44  Cr 1.89  eGFR 39  HB 11 wbc 6k  COVID negative     Assessment/ Plan: 1. DOE/ acute on chronic diastolic CHF - pt w recurrent a/c diast CHF w/ sig volume overload / LE edema. No chest pain, EKG negative. Needs diuresis.  Starting IV lasix. Seen in cardiology clinic this am.  2. Blurred vision - bilat, no other focal symptoms or findings, onset this am, resolved at this time. Pt had eye surgery this summer, he is not sure if diab retinopathy or not.  CT head negative. Could be d/t uncont HTN. Follow 3. Essential HTN/ HTN'sive urgency- never got 2 of his 3 bp medications filled after sept admit. Will start him on his 3 home bp meds and titrate as needed, prn IV labetalol.   4. DM 2 - on oral agents, use mild SSI for here 5. CKD 3a - baseline creat 1.5- 1.8, egFR 49- 31m/min. Creat 1.9 here.     RKelly Splinter MD 05/03/2020, 4:58 PM       Hx HTN , DM2, CHF  bnp 229 trop 11  Hb 11  Creat 1.8   170/ 94  HR 103  RR 22-25  afeb   RA 93%

## 2020-05-03 NOTE — ED Provider Notes (Signed)
[facility] EMERGENCY DEPARTMENT Provider Note   CSN: [id number] Arrival date & time: 05/03/20  1217     History Chief Complaint  Patient presents with  . Chest Pain  . Blurred Vision    [name] is a 54 y.o. male history of obesity, CHF, non-insulin-dependent diabetes, hypertension.  Patient presents today for concern of blurry/double vision.  He reports he woke up this morning around 9 AM and noticed that he was having difficulty seeing, he reports things around his house appeared "dimmer" than they normally do, he reports this is a problem with both of his eyes, not monocular.  He got out of bed and was having difficulty walking around his home, having to hold onto the walls to ambulate.  He then checked his blood sugar and reported it was 76.  He ate some deviled eggs that his wife had made and a small cup of milk and drove to his appointment at the cardiac rehab center.  Patient reports when he got to the rehab center he believes he may have passed out in his car for a short amount of time.  He was given candy by nursing staff and sent to the ED for evaluation.  Patient reports that the dim vision has gradually improved throughout the day and he is feeling better now.  His second concern is shortness of breath which has been consistent for the last 2 months, he reports he has been having difficulty filling his home medications through his pharmacy.  He describes shortness of breath worsened with exertion improved with rest.  Associated with bilateral lower extremity edema and weight gain.  Denies fever/chills, head injury, falls, headache, monocular vision loss, eye pain, difficulty speaking, numbness/tingling, weakness, abdominal pain, vomiting, diarrhea, dysuria/hematuria or any additional concerns.  HPI     Past Medical History:  Diagnosis Date  . CHF (congestive heart failure) ([place])   . Diabetes mellitus   . Hypertension     Patient Active Problem List    Diagnosis Date Noted  . Essential hypertension   . Type 2 diabetes with nephropathy ([place])   . Acute diastolic CHF (congestive heart failure) ([place])   . Acute renal failure superimposed on stage 3a chronic kidney disease ([place])   . Class 2 obesity   . Bilateral leg edema 04/05/2020    Past Surgical History:  Procedure Laterality Date  . INCISION AND DRAINAGE    . KNEE SURGERY         Family History  Problem Relation Age of Onset  . Dementia Mother   . Diabetes Father     Social History   Tobacco Use  . Smoking status: Never Smoker  . Smokeless tobacco: Never Used  Vaping Use  . Vaping Use: Never used  Substance Use Topics  . Alcohol use: No  . Drug use: No    Home Medications Prior to Admission medications   Medication Sig Start Date End Date Taking? Authorizing Provider  furosemide (LASIX) 40 MG tablet Take 1.5 tablets (60 mg total) by mouth 2 (two) times daily. 04/28/20 07/27/20 Yes Imogene Burn, PA-C  glimepiride (AMARYL) 4 MG tablet Take 1 tablet (4 mg total) by mouth daily before breakfast. Patient taking differently: Take 4 mg by mouth in the morning and at bedtime.  07/24/11 05/03/20 Yes [name], MD  hydrALAZINE (APRESOLINE) 100 MG tablet Take 1 tablet (100 mg total) by mouth 3 (three) times daily. 04/10/20  Yes Barton Dubois, MD  metFORMIN (GLUCOPHAGE-XR) 500  MG 24 hr tablet Take 2 tablets (1,000 mg total) by mouth in the morning and at bedtime. 04/10/20  Yes Barton Dubois, MD  Multiple Vitamin (MULTIVITAMIN WITH MINERALS) TABS tablet Take 1 tablet by mouth daily.   Yes [provider]  pravastatin (PRAVACHOL) 40 MG tablet Take 40 mg by mouth in the morning.    Yes [provider]  amLODipine (NORVASC) 10 MG tablet Take 1 tablet (10 mg total) by mouth daily. Patient not taking: Reported on 05/03/2020 04/28/20   Imogene Burn, PA-C  labetalol (NORMODYNE) 200 MG tablet Take 1 tablet (200 mg total) by mouth 2 (two) times daily. Patient  not taking: Reported on 05/03/2020 04/28/20   Imogene Burn, PA-C    Allergies    Patient has no known allergies.  Review of Systems   Review of Systems Ten systems are reviewed and are negative for acute change except as noted in the HPI  Physical Exam Updated Vital Signs BP (!) 157/96   Pulse (!) 101   Temp 98.5 F (36.9 C)   Resp 20   Ht 5\' 11"  (1.803 m)   Wt 126 kg   SpO2 93%   BMI 38.74 kg/m   Physical Exam Constitutional:      General: He is not in acute distress.    Appearance: Normal appearance. He is well-developed. He is not ill-appearing or diaphoretic.  HENT:     Head: Normocephalic and atraumatic.  Eyes:     General: Vision grossly intact. Gaze aligned appropriately.     Pupils: Pupils are equal, round, and reactive to light.  Neck:     Trachea: Trachea and phonation normal.  Cardiovascular:     Rate and Rhythm: Regular rhythm. Tachycardia present.     Pulses:          Dorsalis pedis pulses are 1+ on the right side and 1+ on the left side.  Pulmonary:     Effort: Pulmonary effort is normal. No tachypnea, accessory muscle usage or respiratory distress.     Breath sounds: Normal air entry. Decreased breath sounds present.  Abdominal:     General: There is no distension.     Palpations: Abdomen is soft.     Tenderness: There is no abdominal tenderness. There is no guarding or rebound.  Musculoskeletal:        General: Normal range of motion.     Cervical back: Normal range of motion.     Right lower leg: 2+ Edema present.     Left lower leg: 2+ Edema present.  Skin:    General: Skin is warm and dry.  Neurological:     Mental Status: He is alert.     GCS: GCS eye subscore is 4. GCS verbal subscore is 5. GCS motor subscore is 6.     Comments: Mental Status: Alert, oriented, thought content appropriate, able to give a coherent history. Speech fluent without evidence of aphasia. Able to follow 2 step commands without difficulty. Cranial Nerves: II:  Peripheral visual fields grossly normal, pupils equal, round, reactive to light III,IV, VI: ptosis not present, extra-ocular motions intact bilaterally V,VII: smile symmetric, eyebrows raise symmetric, facial light touch sensation equal VIII: hearing grossly normal to voice X: uvula elevates symmetrically XI: bilateral shoulder shrug symmetric and strong XII: midline tongue extension without fassiculations Motor: Normal tone. 5/5 strength in upper and lower extremities bilaterally including strong and equal grip strength and dorsiflexion/plantar flexion Sensory: Sensation intact to light touch in all  extremities. Cerebellar: normal finger-to-nose with bilateral upper extremities. Normal heel-to -shin balance bilaterally of the lower extremity. No pronator drift.  CV: distal pulses palpable throughout  Psychiatric:        Behavior: Behavior normal.     ED Results / Procedures / Treatments   Labs (all labs ordered are listed, but only abnormal results are displayed) Labs Reviewed  BASIC METABOLIC PANEL - Abnormal; Notable for the following components:      Result Value   Glucose, Bld 116 (*)    BUN 44 (*)    Creatinine, Ser 1.89 (*)    Calcium 8.3 (*)    GFR, Estimated 39 (*)    All other components within normal limits  CBC - Abnormal; Notable for the following components:   RBC 4.13 (*)    Hemoglobin 11.0 (*)    HCT 33.8 (*)    All other components within normal limits  BRAIN NATRIURETIC PEPTIDE - Abnormal; Notable for the following components:   B Natriuretic Peptide 229.0 (*)    All other components within normal limits  CBG MONITORING, ED - Abnormal; Notable for the following components:   Glucose-Capillary 121 (*)    All other components within normal limits  RESPIRATORY PANEL BY RT PCR (FLU A&B, COVID)  APTT  PROTIME-INR  URINALYSIS, ROUTINE W REFLEX MICROSCOPIC  CBG MONITORING, ED  TROPONIN I (HIGH SENSITIVITY)  TROPONIN I (HIGH SENSITIVITY)   EKG EKG  Interpretation  Date/Time:  Monday May 03 2020 13:45:35 EDT Ventricular Rate:  101 PR Interval:    QRS Duration: 91 QT Interval:  357 QTC Calculation: 463 R Axis:   102 Text Interpretation: Sinus tachycardia Right axis deviation Borderline T wave abnormalities No significant change since last tracing Confirmed by Calvert Cantor [phone]) on 05/03/2020 2:09:12 PM   Radiology CT Head Wo Contrast  Result Date: 05/03/2020 CLINICAL DATA:  Vision loss, binocular. Additional history provided: Blurry vision. EXAM: CT HEAD WITHOUT CONTRAST TECHNIQUE: Contiguous axial images were obtained from the base of the skull through the vertex without intravenous contrast. COMPARISON:  No pertinent prior exams are available for comparison. FINDINGS: Brain: There is no acute intracranial hemorrhage. No demarcated cortical infarct. No extra-axial fluid collection. No evidence of intracranial mass. No midline shift. Partially empty sella turcica. Vascular: No hyperdense vessel. Skull: Normal. Negative for fracture or focal lesion. Sinuses/Orbits: Visualized orbits show no acute finding. Severe ethmoid sinus mucosal thickening. Mild mucosal thickening within the visualized maxillary sinuses. No significant mastoid effusion. IMPRESSION: No evidence of acute intracranial abnormality. Partially empty sella turcica. This finding is very commonly incidental, but can be associated with idiopathic intracranial hypertension. Paranasal sinus mucosal thickening, most notably ethmoidal. Electronically Signed   By: Kellie Simmering DO   On: 05/03/2020 14:48   DG Chest Port 1 View  Result Date: 05/03/2020 CLINICAL DATA:  Short of breath.  Chest pain. EXAM: PORTABLE CHEST 1 VIEW COMPARISON:  04/05/2020 FINDINGS: Cardiac enlargement with normal pulmonary vascularity. Improvement in bibasilar atelectasis and small effusions seen previously. No new area of infiltrate. IMPRESSION: Improvement in bibasilar atelectasis and effusion.  Negative for edema. Electronically Signed   By: Franchot Gallo M.D.   On: 05/03/2020 13:55    Procedures Procedures (including critical care time)  Medications Ordered in ED Medications - No data to display  ED Course  I have reviewed the triage vital signs and the nursing notes.  Pertinent labs & imaging results that were available during my care of the patient were reviewed by  me and considered in my medical decision making (see chart for details).  Clinical Course as of May 03 1552  Mon May 03, 2020  1452 [facility]   [BM]  4825 Dr. Jonnie Finner   [BM]    Clinical Course User Index [BM] Gari Crown   MDM Rules/Calculators/A&P                         Additional history obtained from: 1. Nursing notes from this visit. 2. Electronic medical record system reviewed.  Reviewed cardiology note from today.  Initial diagnosis acute diastolic heart failure, recent EF 50%.  He is up 11 pounds since recent discharge.  Has not been taking labetalol or amlodipine.  They increased Lasix to 60 mg twice daily recently but he is not urinating much.  He was sent to the ED for "diabetes and blurred vision".  Also has a diagnosis of resistant hypertension, medication options were limited due to renal dysfunction.  Diagnosis of type 2 diabetes with A1c 9.5, they noted CBG to be 78 this morning and it improved to 100 after soda. ------------------- I ordered, reviewed and interpreted labs which include: APTT/PT/INR within normal limits. High-sensitivity troponin within normal limits. BMP shows baseline creatinine 1.89, BUN 44.  No emergent electrolyte derangement or gap. BNP elevated at 229. CBC shows baseline mild anemia of 11.0, no leukocytosis to suggest infection. CBG 121 Urinalysis and Covid/influenza panel pending.  EKG: Sinus tachycardia Right axis deviation Borderline T wave abnormalities No significant change since last tracing Confirmed by Calvert Cantor [id number]) on 05/03/2020  2:09:12 PM  CT Head:  IMPRESSION:  No evidence of acute intracranial abnormality.    Partially empty sella turcica. This finding is very commonly  incidental, but can be associated with idiopathic intracranial  hypertension.    Paranasal sinus mucosal thickening, most notably ethmoidal.   CXR:  IMPRESSION:  Improvement in bibasilar atelectasis and effusion. Negative for  edema.  - Patient's visual changes resolved prior to initial evaluation.  Suspect secondary to hypoglycemia however TIA questionable.  Work-up above is reassuring.  Will defer further work-up and imaging to hospitalist service.  Additionally patient with acute on chronic CHF, chest x-ray and BNP are improved he is not requiring any supplemental O2 but he has been noncompliant with home medications.  He was seen and evaluated by cardiology while in the emergency department, [facility], they have asked for hospitalist admission for diuresis and will round on patient.  Discussed case with Dr. Karle Starch who agrees with admission. - Patient reassessed he is resting comfortably in bed no acute distress denies any concerns at this time he is agreeable for admission. - 3:51 PM: Discussed case with Dr. Jonnie Finner, patient accepted to hospitalist service.  Note: Portions of this report may have been transcribed using voice recognition software. Every effort was made to ensure accuracy; however, inadvertent computerized transcription errors may still be present. Final Clinical Impression(s) / ED Diagnoses Final diagnoses:  Acute on chronic congestive heart failure, unspecified heart failure type ([place])  Hypoglycemia  Vision changes    Rx / DC Orders ED Discharge Orders    None       Gari Crown 05/03/20 1555    Truddie Hidden, MD 05/04/20 [phone]

## 2020-05-04 LAB — BASIC METABOLIC PANEL
Anion gap: 11 (ref 5–15)
BUN: 45 mg/dL — ABNORMAL HIGH (ref 6–20)
CO2: 28 mmol/L (ref 22–32)
Calcium: 8.2 mg/dL — ABNORMAL LOW (ref 8.9–10.3)
Chloride: 103 mmol/L (ref 98–111)
Creatinine, Ser: 1.88 mg/dL — ABNORMAL HIGH (ref 0.61–1.24)
GFR, Estimated: 39 mL/min — ABNORMAL LOW (ref >60)
Glucose, Bld: 88 mg/dL (ref 70–99)
Potassium: 3.7 mmol/L (ref 3.5–5.1)
Sodium: 142 mmol/L (ref 135–145)

## 2020-05-04 LAB — GLUCOSE, CAPILLARY
Glucose-Capillary: 75 mg/dL (ref 70–99)
Glucose-Capillary: 147 mg/dL — ABNORMAL HIGH (ref 70–99)
Glucose-Capillary: 216 mg/dL — ABNORMAL HIGH (ref 70–99)

## 2020-05-04 NOTE — Progress Notes (Signed)
PROGRESS NOTE    [name]  WNU:[id number] DOB: June 08, 1966 DOA: 05/03/2020 PCP: [name], MD   Brief Narrative: 54 years old male with history of hypertension, acute diastolic CHF, CKD stage IIIa,  hyperlipidemia presents in the emergency department with blurred vision, uncontrolled blood pressure and left leg swelling.  Patient was recently discharged from the hospital 3 weeks ago after being admitted for CHF exacerbation.  Patient has gained 20 pounds in last 2 weeks.  Patient has been noncompliant with his medications and diet.  Patient is admitted for acute on chronic diastolic CHF exacerbation and hypertensive urgency.  Symptoms and blood pressure has improved.  Assessment & Plan:   Principal Problem:   Acute on chronic diastolic CHF (congestive heart failure) (HCC) Active Problems:   Bilateral leg edema   Essential hypertension   Type 2 diabetes with nephropathy (HCC)   CKD (chronic kidney disease) stage 3, GFR 30-59 ml/min (HCC)   Hypertensive urgency   Blurred vision, bilateral   Acute on chronic diastolic (congestive) heart failure (HCC)  Acute on chronic diastolic CHF exacerbation: Presents with orthopnea, 20 pound weight gain, orthopnea. BNP 227, CXR : cardiac enlargement . EKG : unremarkable.  Continue IV Lasix 60 mg twice daily. Strict intake/ output charting, daily weight Recent echocardiogram 9/21: shows LVEF 50%.  No regional wall motion abnormality. Denies any chest pain, dizziness, palpitation. He will probably need IV diuresis for couple of days.  Essential hypertension: Presented with hypertensive urgency, Home BP med resumed , Hydralazine PRN Blood pressure improved.  Diabetes mellitus type 2: Hold p.o. diabetic medications, Regular insulin sliding scale,  obtain hemoglobin A1c  CKD stage IIIa: Serum creatinine at baseline. 1.5-1.8 Avoid nephrotoxic medications.  Morbid obesity: Needs outpatient dietitian consult and  counseling.   DVT prophylaxis: Lovenox Code Status: Full code Family Communication: No one at bedside Disposition Plan:   Status is: Inpatient  Remains inpatient appropriate because:Inpatient level of care appropriate due to severity of illness   Dispo: The patient is from: Home              Anticipated d/c is to: Home              Anticipated d/c date is: 2 days              Patient currently is not medically stable to d/c.  Consultants:   None  Procedures:  Antimicrobials:  Anti-infectives (From admission, onward)   None      Subjective: Patient was seen and examined at bedside.  Overnight events noted.  He denies any chest pain. He reports symptoms has resolved.  Dizziness has improved,  blood pressure has improved.  Objective: Vitals:   05/03/20 2359 05/04/20 0416 05/04/20 0800 05/04/20 1345  BP: (!) 148/90 132/78 140/87 118/71  Pulse: 97 93 92 88  Resp:  18 20 16   Temp: 99.2 F (37.3 C) 99.4 F (37.4 C) 99.5 F (37.5 C) 98.2 F (36.8 C)  TempSrc:   Oral Oral  SpO2: 94% 92% 93% 96%  Weight:  125 kg    Height:        Intake/Output Summary (Last 24 hours) at 05/04/2020 1356 Last data filed at 05/04/2020 1343 Gross per 24 hour  Intake --  Output 2700 ml  Net -2700 ml   Filed Weights   05/03/20 1232 05/04/20 0416  Weight: 126 kg 125 kg    Examination:  General exam: Appears calm and comfortable.   Respiratory system: Bibasilar crackles noted  auscultation. Respiratory effort normal. Cardiovascular system: S1 & S2 heard, RRR. No JVD, murmurs, rubs, gallops or clicks. No pedal edema. Gastrointestinal system: Abdomen is nondistended, soft and nontender. No organomegaly or masses felt. Normal bowel sounds heard. Central nervous system: Alert and oriented. No focal neurological deficits. Extremities:  Pitting edema +++ Noted, No cyanosis, No clubbing. Skin: No rashes, lesions or ulcers Psychiatry: Judgement and insight appear normal. Mood & affect  appropriate.     Data Reviewed: I have personally reviewed following labs and imaging studies  CBC: Recent Labs  Lab 05/03/20 1329  WBC 6.1  HGB 11.0*  HCT 33.8*  MCV 81.8  PLT 341   Basic Metabolic Panel: Recent Labs  Lab 04/28/20 1435 05/03/20 1329 05/04/20 0655  NA 145* 141 142  K 4.4 4.0 3.7  CL 106 103 103  CO2 28 27 28   GLUCOSE 176* 116* 88  BUN 34* 44* 45*  CREATININE 1.55* 1.89* 1.88*  CALCIUM 8.4* 8.3* 8.2*   GFR: Estimated Creatinine Clearance: 59.8 mL/min (A) (by C-G formula based on SCr of 1.88 mg/dL (H)). Liver Function Tests: No results for input(s): AST, ALT, ALKPHOS, BILITOT, PROT, ALBUMIN in the last 168 hours. No results for input(s): LIPASE, AMYLASE in the last 168 hours. No results for input(s): AMMONIA in the last 168 hours. Coagulation Profile: Recent Labs  Lab 05/03/20 1353  INR 1.0   Cardiac Enzymes: No results for input(s): CKTOTAL, CKMB, CKMBINDEX, TROPONINI in the last 168 hours. BNP (last 3 results) No results for input(s): PROBNP in the last 8760 hours. HbA1C: No results for input(s): HGBA1C in the last 72 hours. CBG: Recent Labs  Lab 05/03/20 1241 05/03/20 2037 05/04/20 0807 05/04/20 1149  GLUCAP 121* 147* 75 147*   Lipid Profile: No results for input(s): CHOL, HDL, LDLCALC, TRIG, CHOLHDL, LDLDIRECT in the last 72 hours. Thyroid Function Tests: No results for input(s): TSH, T4TOTAL, FREET4, T3FREE, THYROIDAB in the last 72 hours. Anemia Panel: No results for input(s): VITAMINB12, FOLATE, FERRITIN, TIBC, IRON, RETICCTPCT in the last 72 hours. Sepsis Labs: No results for input(s): PROCALCITON, LATICACIDVEN in the last 168 hours.  Recent Results (from the past 240 hour(s))  Respiratory Panel by RT PCR (Flu A&B, Covid) - Nasopharyngeal Swab     Status: None   Collection Time: 05/03/20  3:33 PM   Specimen: Nasopharyngeal Swab  Result Value Ref Range Status   SARS Coronavirus 2 by RT PCR NEGATIVE NEGATIVE Final     Comment: (NOTE) SARS-CoV-2 target nucleic acids are NOT DETECTED.  The SARS-CoV-2 RNA is generally detectable in upper respiratoy specimens during the acute phase of infection. The lowest concentration of SARS-CoV-2 viral copies this assay can detect is 131 copies/mL. A negative result does not preclude SARS-Cov-2 infection and should not be used as the sole basis for treatment or other patient management decisions. A negative result may occur with  improper specimen collection/handling, submission of specimen other than nasopharyngeal swab, presence of viral mutation(s) within the areas targeted by this assay, and inadequate number of viral copies (<131 copies/mL). A negative result must be combined with clinical observations, patient history, and epidemiological information. The expected result is Negative.  Fact Sheet for Patients:  PinkCheek.be  Fact Sheet for Healthcare Providers:  GravelBags.it  This test is no t yet approved or cleared by the [place] FDA and  has been authorized for detection and/or diagnosis of SARS-CoV-2 by FDA under an Emergency Use Authorization (EUA). This EUA will remain  in effect (meaning  this test can be used) for the duration of the COVID-19 declaration under Section 564(b)(1) of the Act, 21 U.S.C. section 360bbb-3(b)(1), unless the authorization is terminated or revoked sooner.     Influenza A by PCR NEGATIVE NEGATIVE Final   Influenza B by PCR NEGATIVE NEGATIVE Final    Comment: (NOTE) The Xpert Xpress SARS-CoV-2/FLU/RSV assay is intended as an aid in  the diagnosis of influenza from Nasopharyngeal swab specimens and  should not be used as a sole basis for treatment. Nasal washings and  aspirates are unacceptable for Xpert Xpress SARS-CoV-2/FLU/RSV  testing.  Fact Sheet for Patients: PinkCheek.be  Fact Sheet for Healthcare  Providers: GravelBags.it  This test is not yet approved or cleared by the [place] FDA and  has been authorized for detection and/or diagnosis of SARS-CoV-2 by  FDA under an Emergency Use Authorization (EUA). This EUA will remain  in effect (meaning this test can be used) for the duration of the  Covid-19 declaration under Section 564(b)(1) of the Act, 21  U.S.C. section 360bbb-3(b)(1), unless the authorization is  terminated or revoked. Performed at [facility], [address] [place], [place] [address]          Radiology Studies: CT Head Wo Contrast  Result Date: 05/03/2020 CLINICAL DATA:  Vision loss, binocular. Additional history provided: Blurry vision. EXAM: CT HEAD WITHOUT CONTRAST TECHNIQUE: Contiguous axial images were obtained from the base of the skull through the vertex without intravenous contrast. COMPARISON:  No pertinent prior exams are available for comparison. FINDINGS: Brain: There is no acute intracranial hemorrhage. No demarcated cortical infarct. No extra-axial fluid collection. No evidence of intracranial mass. No midline shift. Partially empty sella turcica. Vascular: No hyperdense vessel. Skull: Normal. Negative for fracture or focal lesion. Sinuses/Orbits: Visualized orbits show no acute finding. Severe ethmoid sinus mucosal thickening. Mild mucosal thickening within the visualized maxillary sinuses. No significant mastoid effusion. IMPRESSION: No evidence of acute intracranial abnormality. Partially empty sella turcica. This finding is very commonly incidental, but can be associated with idiopathic intracranial hypertension. Paranasal sinus mucosal thickening, most notably ethmoidal. Electronically Signed   By: Kellie Simmering DO   On: 05/03/2020 14:48   DG Chest Port 1 View  Result Date: 05/03/2020 CLINICAL DATA:  Short of breath.  Chest pain. EXAM: PORTABLE CHEST 1 VIEW COMPARISON:  04/05/2020 FINDINGS: Cardiac enlargement with  normal pulmonary vascularity. Improvement in bibasilar atelectasis and small effusions seen previously. No new area of infiltrate. IMPRESSION: Improvement in bibasilar atelectasis and effusion. Negative for edema. Electronically Signed   By: Franchot Gallo M.D.   On: 05/03/2020 13:55   Scheduled Meds: . amLODipine  10 mg Oral Daily  . enoxaparin (LOVENOX) injection  60 mg Subcutaneous Q24H  . furosemide  60 mg Intravenous BID  . hydrALAZINE  100 mg Oral TID  . labetalol  200 mg Oral BID  . multivitamin with minerals  1 tablet Oral Daily  . pravastatin  40 mg Oral q AM  . sodium chloride flush  3 mL Intravenous Q12H   Continuous Infusions: . sodium chloride       LOS: 1 day    Time spent: 35 mins    Ksean Vale, MD [facility]   If 7PM-7AM, please contact night-coverage

## 2020-05-04 NOTE — TOC Initial Note (Signed)
Transition of Care [facility]) - Initial/Assessment Note    Patient Details  Name: [name] MRN: [id number] Date of Birth: Dec 25, 1965  Transition of Care [facility]) CM/SW Contact:    Salome Arnt, [place] Phone Number: 05/04/2020, 8:45 AM  Clinical Narrative:  Pt admitted due to bilateral leg edema/CHF. TOC received consult for CHF screening. Pt reports he lives with his wife, father, brother-in-law, and grandson. He is fairly independent with ADLs at baseline. Pt plans to return home when medically stable. LCSW completed CHF screening. Pt states prior to admission, he was weighing himself weekly. He has been educated on the importance of daily weights and plans to do this when he returns home. His wife cooks all his meals and pt said she is "very strict" with following heart healthy diet. Pt indicates his PCP referred him to cardiologist last week. He takes his medications as prescribed. TOC will continue to follow.                   Expected Discharge Plan: Home/Self Care Barriers to Discharge: Continued Medical Work up   Patient Goals and CMS Choice Patient states their goals for this hospitalization and ongoing recovery are:: return home      Expected Discharge Plan and Services Expected Discharge Plan: Home/Self Care In-house Referral: Clinical Social Work   Post Acute Care Choice: NA Living arrangements for the past 2 months: Single Family Home                                      Prior Living Arrangements/Services Living arrangements for the past 2 months: Single Family Home Lives with:: Relatives Patient language and need for interpreter reviewed:: Yes Do you feel safe going back to the place where you live?: Yes      Need for Family Participation in Patient Care: Yes (Comment) Care giver support system in place?: Yes (comment)   Criminal Activity/Legal Involvement Pertinent to Current Situation/Hospitalization: No - Comment as needed  Activities of  Daily Living Home Assistive Devices/Equipment: Blood pressure cuff ADL Screening (condition at time of admission) Patient's cognitive ability adequate to safely complete daily activities?: Yes Is the patient deaf or have difficulty hearing?: No Does the patient have difficulty seeing, even when wearing glasses/contacts?: No Does the patient have difficulty concentrating, remembering, or making decisions?: No Patient able to express need for assistance with ADLs?: Yes Does the patient have difficulty dressing or bathing?: No Independently performs ADLs?: Yes (appropriate for developmental age) Does the patient have difficulty walking or climbing stairs?: Yes Weakness of Legs: Both Weakness of Arms/Hands: None  Permission Sought/Granted                  Emotional Assessment Appearance:: Appears stated age Attitude/Demeanor/Rapport: Engaged Affect (typically observed): Accepting Orientation: : Oriented to Self, Oriented to Place, Oriented to  Time, Oriented to Situation Alcohol / Substance Use: Not Applicable Psych Involvement: No (comment)  Admission diagnosis:  Hypoglycemia [E16.2] Vision changes [H53.9] Acute on chronic diastolic (congestive) heart failure (HCC) [I50.33] Acute on chronic congestive heart failure, unspecified heart failure type [facility]) [I50.9] Patient Active Problem List   Diagnosis Date Noted  . CKD (chronic kidney disease) stage 3, GFR 30-59 ml/min (HCC) 05/03/2020  . Hypertensive urgency 05/03/2020  . Blurred vision, bilateral 05/03/2020  . Acute on chronic diastolic (congestive) heart failure ([place]) 05/03/2020  . Essential hypertension   . Type 2  diabetes with nephropathy ([place])   . Acute on chronic diastolic CHF (congestive heart failure) ([place])   . Acute renal failure superimposed on stage 3a chronic kidney disease ([place])   . Class 2 obesity   . Bilateral leg edema 04/05/2020   PCP:  [name], MD Pharmacy:   [place],  [place] - [place] [address] [place] [address] [place] [address] Phone: [phone] Fax: [phone]     Social Determinants of Health (SDOH) Interventions    Readmission Risk Interventions No flowsheet data found.

## 2020-05-04 NOTE — Progress Notes (Signed)
Nutrition Education Note  RD consulted for nutrition education regarding acute diastolic CHF/HTN. Patient hospitalized in September due to CHF. Hx of BLE edema, CKD-3a and DM2.  RD provided "Low Sodium Nutrition Therapy" handout from the Academy of Nutrition and Dietetics. Reviewed patient's dietary recall. Patient lives with his wife who does most of the cooking for their household. He recalls breakfast as scrambled or boiled egg and lunch baked chicken and vegetable such as green beans. Patient says lately he is not been able to eat as much food as he used to. He limits eating out and doesn't use a salt shaker. We discussed Ms Deliah Boston as a salt-free flavor enhancer.   Provided examples on ways to decrease sodium intake in diet. Discouraged intake of processed foods. Encouraged fresh fruits and vegetables as well as whole grain sources of carbohydrates to maximize fiber intake.   RD discussed why it is important for patient to adhere to diet recommendations, and emphasized the role of fluids, foods to avoid, and importance of weighing self daily.   Expect good compliance.  Body mass index is 38.44 kg/m. Pt meets criteria for obeisity based on current BMI.  Current diet order is Heart Healthy, patient is consuming approximately 75-100% of meals at this time.   Labs and medications reviewed. No further nutrition interventions warranted at this time. RD contact information provided. If additional nutrition issues arise, please re-consult RD.   Colman Cater MS,RD,CSG,LDN  Pager: [phone]

## 2020-05-05 DIAGNOSIS — I509 Heart failure, unspecified: Secondary | ICD-10-CM

## 2020-05-05 LAB — BASIC METABOLIC PANEL
Anion gap: 9 (ref 5–15)
BUN: 43 mg/dL — ABNORMAL HIGH (ref 6–20)
CO2: 31 mmol/L (ref 22–32)
Calcium: 8.3 mg/dL — ABNORMAL LOW (ref 8.9–10.3)
Chloride: 101 mmol/L (ref 98–111)
Creatinine, Ser: 2.03 mg/dL — ABNORMAL HIGH (ref 0.61–1.24)
GFR, Estimated: 36 mL/min — ABNORMAL LOW (ref >60)
Glucose, Bld: 108 mg/dL — ABNORMAL HIGH (ref 70–99)
Potassium: 3.8 mmol/L (ref 3.5–5.1)
Sodium: 141 mmol/L (ref 135–145)

## 2020-05-05 LAB — GLUCOSE, CAPILLARY
Glucose-Capillary: 95 mg/dL (ref 70–99)
Glucose-Capillary: 149 mg/dL — ABNORMAL HIGH (ref 70–99)
Glucose-Capillary: 173 mg/dL — ABNORMAL HIGH (ref 70–99)
Glucose-Capillary: 146 mg/dL — ABNORMAL HIGH (ref 70–99)

## 2020-05-05 MED ORDER — FUROSEMIDE 40 MG PO TABS
60.0000 mg | ORAL_TABLET | Freq: Two times a day (BID) | ORAL | Status: DC
  Administered 2020-05-05 – 2020-05-07 (×4): 60 mg via ORAL
  Filled 2020-05-05 (×3): qty 1

## 2020-05-05 NOTE — Progress Notes (Signed)
[facility]  PROGRESS NOTE  [name] JJH:[id number] DOB: [birth year] DOA: 05/03/2020 PCP: [name], MD Admit date - 05/03/2020   Admitting Physician Roney Jaffe, MD  Outpatient Primary MD for the patient is [name], MD  LOS - 2 Brief Narrative   [name] is a 54 y.o. year old male with medical history significant for HTN, diastolic CHF, CKD stage III, HLD presents on 05/03/20 with blurred vision and left leg swelling and found to have acute CHF exacerbation in the setting of hypertensive urgency related to poor adherence to recently prescribed BP medications.     Subjective  Today feels breathing has improved notably  A & P   Acute on chronic diastolic CHF exacerbation in setting of hypertensive emergency.  Blood pressure much better controlled.  Still has some 1+ pitting edema but significantly improved.  Denies any persistent shortness of breath with exertion, shortness of breath at rest. -We will transition from IV Lasix to p.o. today -Close monitor respiratory status and symptoms if remains stable anticipate discharge next 24 hours  HTN, blood pressure currently at goal-continue home BP meds including amlodipine 10 mg, hydralazine 100 mg 3 times daily, labetalol 200 mg twice daily  Type 2 diabetes, poorly controlled, A1c 9.5.  CBGs at goal -Home oral hypoglycemic medications held on admission -Monitor CBG, sliding scale as needed  Obesity, class II BMI 38 -Needs outpatient counseling  CKD stage III, baseline 1.5-1.8.  Currently 2 Likely in the setting of aggressive IV diuresis -Discontinue IV Lasix, transition to oral Lasix, monitor BMP -Avoid nephrotoxins -Monitor output    Family Communication  : None  Code Status : Full  Disposition Plan  :  Patient is from home. Anticipated d/c date:  To days. Barriers to d/c or necessity for inpatient status:  Transition from IV Lasix to oral Lasix, if does well on oral diuresis next 24 hours  anticipate discharge, also needs close monitoring of creatinine Consults  :    Procedures  :    DVT Prophylaxis  :  Lovenox   MDM: The below labs and imaging reports were reviewed and summarized above.  Medication management as above.  Lab Results  Component Value Date   PLT 257 05/03/2020    Diet :  Diet Order            Diet Heart Room service appropriate? Yes; Fluid consistency: Thin  Diet effective now                  Inpatient Medications Scheduled Meds: . amLODipine  10 mg Oral Daily  . enoxaparin (LOVENOX) injection  60 mg Subcutaneous Q24H  . furosemide  60 mg Oral BID  . hydrALAZINE  100 mg Oral TID  . labetalol  200 mg Oral BID  . multivitamin with minerals  1 tablet Oral Daily  . pravastatin  40 mg Oral q AM  . sodium chloride flush  3 mL Intravenous Q12H   Continuous Infusions: . sodium chloride     PRN Meds:.sodium chloride, acetaminophen, ondansetron (ZOFRAN) IV, sodium chloride flush  Antibiotics  :   Anti-infectives (From admission, onward)   None       Objective   Vitals:   05/04/20 1345 05/04/20 2027 05/05/20 0436 05/05/20 1451  BP: 118/71 (!) 145/85 (!) 143/87 (!) 147/87  Pulse: 88 95 91 88  Resp: 16 20 20 16   Temp: 98.2 F (36.8 C) 99.1 F (37.3 C) 100.2 F (37.9 C) 98.2 F (36.8  C)  TempSrc: Oral Oral Oral Oral  SpO2: 96% 96% 94% 94%  Weight:   125.5 kg   Height:        SpO2: 94 %  Wt Readings from Last 3 Encounters:  05/05/20 125.5 kg  05/03/20 126.6 kg  04/28/20 127.4 kg     Intake/Output Summary (Last 24 hours) at 05/05/2020 1918 Last data filed at 05/05/2020 1700 Gross per 24 hour  Intake 720 ml  Output 3950 ml  Net -3230 ml    Physical Exam:     Awake Alert, Oriented X 3, Normal affect No new F.N deficits,  [place].AT, Normal respiratory effort on room air, CTAB RRR,No Gallops,Rubs or new Murmurs,  +ve B.Sounds, Abd Soft, No tenderness, No rebound, guarding or rigidity. No Cyanosis, No new Rash or  bruise   1+ pitting edema bilateral lower extremities   I have personally reviewed the following:   Data Reviewed:  CBC Recent Labs  Lab 05/03/20 1329  WBC 6.1  HGB 11.0*  HCT 33.8*  PLT 257  MCV 81.8  MCH 26.6  MCHC 32.5  RDW 14.1    Chemistries  Recent Labs  Lab 05/03/20 1329 05/04/20 0655 05/05/20 0624  NA 141 142 141  K 4.0 3.7 3.8  CL 103 103 101  CO2 27 28 31   GLUCOSE 116* 88 108*  BUN 44* 45* 43*  CREATININE 1.89* 1.88* 2.03*  CALCIUM 8.3* 8.2* 8.3*   ------------------------------------------------------------------------------------------------------------------ No results for input(s): CHOL, HDL, LDLCALC, TRIG, CHOLHDL, LDLDIRECT in the last 72 hours.  Lab Results  Component Value Date   HGBA1C 9.5 (H) 04/06/2020   ------------------------------------------------------------------------------------------------------------------ No results for input(s): TSH, T4TOTAL, T3FREE, THYROIDAB in the last 72 hours.  Invalid input(s): FREET3 ------------------------------------------------------------------------------------------------------------------ No results for input(s): VITAMINB12, FOLATE, FERRITIN, TIBC, IRON, RETICCTPCT in the last 72 hours.  Coagulation profile Recent Labs  Lab 05/03/20 1353  INR 1.0    No results for input(s): DDIMER in the last 72 hours.  Cardiac Enzymes No results for input(s): CKMB, TROPONINI, MYOGLOBIN in the last 168 hours.  Invalid input(s): CK ------------------------------------------------------------------------------------------------------------------    Component Value Date/Time   BNP 229.0 (H) 05/03/2020 1329    Micro Results Recent Results (from the past 240 hour(s))  Respiratory Panel by RT PCR (Flu A&B, Covid) - Nasopharyngeal Swab     Status: None   Collection Time: 05/03/20  3:33 PM   Specimen: Nasopharyngeal Swab  Result Value Ref Range Status   SARS Coronavirus 2 by RT PCR NEGATIVE NEGATIVE  Final    Comment: (NOTE) SARS-CoV-2 target nucleic acids are NOT DETECTED.  The SARS-CoV-2 RNA is generally detectable in upper respiratoy specimens during the acute phase of infection. The lowest concentration of SARS-CoV-2 viral copies this assay can detect is 131 copies/mL. A negative result does not preclude SARS-Cov-2 infection and should not be used as the sole basis for treatment or other patient management decisions. A negative result may occur with  improper specimen collection/handling, submission of specimen other than nasopharyngeal swab, presence of viral mutation(s) within the areas targeted by this assay, and inadequate number of viral copies (<131 copies/mL). A negative result must be combined with clinical observations, patient history, and epidemiological information. The expected result is Negative.  Fact Sheet for Patients:  PinkCheek.be  Fact Sheet for Healthcare Providers:  GravelBags.it  This test is no t yet approved or cleared by the [place] FDA and  has been authorized for detection and/or diagnosis of SARS-CoV-2 by FDA under an Emergency  Use Authorization (EUA). This EUA will remain  in effect (meaning this test can be used) for the duration of the COVID-19 declaration under Section 564(b)(1) of the Act, 21 U.S.C. section 360bbb-3(b)(1), unless the authorization is terminated or revoked sooner.     Influenza A by PCR NEGATIVE NEGATIVE Final   Influenza B by PCR NEGATIVE NEGATIVE Final    Comment: (NOTE) The Xpert Xpress SARS-CoV-2/FLU/RSV assay is intended as an aid in  the diagnosis of influenza from Nasopharyngeal swab specimens and  should not be used as a sole basis for treatment. Nasal washings and  aspirates are unacceptable for Xpert Xpress SARS-CoV-2/FLU/RSV  testing.  Fact Sheet for Patients: PinkCheek.be  Fact Sheet for Healthcare  Providers: GravelBags.it  This test is not yet approved or cleared by the [place] FDA and  has been authorized for detection and/or diagnosis of SARS-CoV-2 by  FDA under an Emergency Use Authorization (EUA). This EUA will remain  in effect (meaning this test can be used) for the duration of the  Covid-19 declaration under Section 564(b)(1) of the Act, 21  U.S.C. section 360bbb-3(b)(1), unless the authorization is  terminated or revoked. Performed at [facility], [address] [place], [place] [address]     Radiology Reports CT Head Wo Contrast  Result Date: 05/03/2020 CLINICAL DATA:  Vision loss, binocular. Additional history provided: Blurry vision. EXAM: CT HEAD WITHOUT CONTRAST TECHNIQUE: Contiguous axial images were obtained from the base of the skull through the vertex without intravenous contrast. COMPARISON:  No pertinent prior exams are available for comparison. FINDINGS: Brain: There is no acute intracranial hemorrhage. No demarcated cortical infarct. No extra-axial fluid collection. No evidence of intracranial mass. No midline shift. Partially empty sella turcica. Vascular: No hyperdense vessel. Skull: Normal. Negative for fracture or focal lesion. Sinuses/Orbits: Visualized orbits show no acute finding. Severe ethmoid sinus mucosal thickening. Mild mucosal thickening within the visualized maxillary sinuses. No significant mastoid effusion. IMPRESSION: No evidence of acute intracranial abnormality. Partially empty sella turcica. This finding is very commonly incidental, but can be associated with idiopathic intracranial hypertension. Paranasal sinus mucosal thickening, most notably ethmoidal. Electronically Signed   By: Kellie Simmering DO   On: 05/03/2020 14:48   US Venous Img Lower Unilateral Left (DVT)  Result Date: 04/21/2020 CLINICAL DATA:  Left lower extremity edema, pain EXAM: LEFT LOWER EXTREMITY VENOUS DOPPLER ULTRASOUND TECHNIQUE: Gray-scale  sonography with compression, as well as color and duplex ultrasound, were performed to evaluate the deep venous system(s) from the level of the common femoral vein through the popliteal and proximal calf veins. COMPARISON:  None. FINDINGS: VENOUS Normal compressibility of the common femoral, superficial femoral, and popliteal veins, as well as the visualized calf veins. Visualized portions of profunda femoral vein and great saphenous vein unremarkable. No filling defects to suggest DVT on grayscale or color Doppler imaging. Doppler waveforms show normal direction of venous flow, normal respiratory plasticity and response to augmentation. Limited views of the contralateral common femoral vein are unremarkable. OTHER Edema throughout the subcutaneous soft tissues of the left lower extremity. Mildly prominent left groin lymph node with a short axis diameter of 10 mm. Limitations: none IMPRESSION: No evidence of left lower extremity DVT. Electronically Signed   By: Rolm Baptise M.D.   On: 04/21/2020 15:05   DG Chest Port 1 View  Result Date: 05/03/2020 CLINICAL DATA:  Short of breath.  Chest pain. EXAM: PORTABLE CHEST 1 VIEW COMPARISON:  04/05/2020 FINDINGS: Cardiac enlargement with normal pulmonary vascularity. Improvement in  bibasilar atelectasis and small effusions seen previously. No new area of infiltrate. IMPRESSION: Improvement in bibasilar atelectasis and effusion. Negative for edema. Electronically Signed   By: Franchot Gallo M.D.   On: 05/03/2020 13:55   ECHOCARDIOGRAM COMPLETE  Result Date: 04/06/2020    ECHOCARDIOGRAM REPORT   Patient Name:   [name] Date of Exam: 04/06/2020 Medical Rec #:  [id number]       Height:       71.0 in Accession #:    [id number]      Weight:       270.0 lb Date of Birth:  1966/04/13       BSA:          2.395 m Patient Age:    71 years        BP:           182/105 mmHg Patient Gender: M               HR:           80 bpm. Exam Location:  [facility] Procedure: 2D Echo  Indications:    CHF-Acute Diastolic 035.00 / X38.18  History:        Patient has no prior history of Echocardiogram examinations.                 Risk Factors:Diabetes, Non-Smoker and Hypertension. Bilateral                 Edema.  Sonographer:    Leavy Cella RDCS (AE) Referring Phys: [place]  1. Left ventricular ejection fraction, by estimation, is 50%. The left ventricle has normal function. The left ventricle has no regional wall motion abnormalities. There is severe left ventricular hypertrophy. Left ventricular diastolic parameters are consistent with Grade II diastolic dysfunction (pseudonormalization). Elevated left atrial pressure.  2. Right ventricular systolic function is normal. The right ventricular size is normal.  3. Left atrial size was mildly dilated.  4. Large pleural effusion in the left lateral region.  5. The mitral valve is normal in structure. Trivial mitral valve regurgitation. No evidence of mitral stenosis.  6. The aortic valve is tricuspid. Aortic valve regurgitation is not visualized. No aortic stenosis is present.  7. The inferior vena cava is normal in size with greater than 50% respiratory variability, suggesting right atrial pressure of 3 mmHg. FINDINGS  Left Ventricle: Left ventricular ejection fraction, by estimation, is 50%. The left ventricle has normal function. The left ventricle has no regional wall motion abnormalities. The left ventricular internal cavity size was normal in size. There is severe left ventricular hypertrophy. Left ventricular diastolic parameters are consistent with Grade II diastolic dysfunction (pseudonormalization). Elevated left atrial pressure. Right Ventricle: The right ventricular size is normal. No increase in right ventricular wall thickness. Right ventricular systolic function is normal. Left Atrium: Left atrial size was mildly dilated. Right Atrium: Right atrial size was normal in size. Pericardium: There is no evidence of  pericardial effusion. Mitral Valve: The mitral valve is normal in structure. Trivial mitral valve regurgitation. No evidence of mitral valve stenosis. Tricuspid Valve: The tricuspid valve is normal in structure. Tricuspid valve regurgitation is not demonstrated. No evidence of tricuspid stenosis. Aortic Valve: The aortic valve is tricuspid. Aortic valve regurgitation is not visualized. No aortic stenosis is present. Aortic valve mean gradient measures 2.0 mmHg. Aortic valve peak gradient measures 3.9 mmHg. Aortic valve area, by VTI measures 2.73 cm. Pulmonic Valve: The pulmonic valve  was not well visualized. Pulmonic valve regurgitation is not visualized. No evidence of pulmonic stenosis. Aorta: The aortic root is normal in size and structure. Pulmonary Artery: Indeterminant PASP, inadequate TR jet. Venous: The inferior vena cava is normal in size with greater than 50% respiratory variability, suggesting right atrial pressure of 3 mmHg. IAS/Shunts: No atrial level shunt detected by color flow Doppler. Additional Comments: There is a large pleural effusion in the left lateral region.  LEFT VENTRICLE PLAX 2D LVIDd:         5.09 cm  Diastology LVIDs:         3.99 cm  LV e' medial:    7.29 cm/s LV PW:         1.50 cm  LV E/e' medial:  13.0 LV IVS:        1.42 cm  LV e' lateral:   6.42 cm/s LVOT diam:     2.10 cm  LV E/e' lateral: 14.8 LV SV:         52 LV SV Index:   22 LVOT Area:     3.46 cm  RIGHT VENTRICLE RV S prime:     11.00 cm/s TAPSE (M-mode): 2.5 cm LEFT ATRIUM           Index       RIGHT ATRIUM           Index LA diam:      4.50 cm 1.88 cm/m  RA Area:     15.00 cm LA Vol (A2C): 65.3 ml 27.26 ml/m RA Volume:   40.70 ml  16.99 ml/m LA Vol (A4C): 66.0 ml 27.55 ml/m  AORTIC VALVE AV Area (Vmax):    2.66 cm AV Area (Vmean):   2.51 cm AV Area (VTI):     2.73 cm AV Vmax:           98.82 cm/s AV Vmean:          67.098 cm/s AV VTI:            0.191 m AV Peak Grad:      3.9 mmHg AV Mean Grad:      2.0 mmHg  LVOT Vmax:         75.84 cm/s LVOT Vmean:        48.631 cm/s LVOT VTI:          0.151 m LVOT/AV VTI ratio: 0.79  AORTA Ao Root diam: 2.80 cm MITRAL VALVE MV Area (PHT): 3.53 cm    SHUNTS MV Decel Time: 215 msec    Systemic VTI:  0.15 m MR Peak grad: 25.2 mmHg    Systemic Diam: 2.10 cm MR Vmax:      251.00 cm/s MV E velocity: 95.10 cm/s MV A velocity: 20.10 cm/s MV E/A ratio:  4.73 Carlyle Dolly MD Electronically signed by Carlyle Dolly MD Signature Date/Time: 04/06/2020/11:04:27 AM    Final      Time Spent in minutes  30     Desiree Hane M.D on 05/05/2020 at 7:18 PM  To page go to [email or web address] - password [facility]

## 2020-05-06 LAB — BASIC METABOLIC PANEL
Anion gap: 11 (ref 5–15)
BUN: 43 mg/dL — ABNORMAL HIGH (ref 6–20)
CO2: 30 mmol/L (ref 22–32)
Calcium: 8.2 mg/dL — ABNORMAL LOW (ref 8.9–10.3)
Chloride: 99 mmol/L (ref 98–111)
Creatinine, Ser: 2.26 mg/dL — ABNORMAL HIGH (ref 0.61–1.24)
GFR, Estimated: 31 mL/min — ABNORMAL LOW (ref >60)
Glucose, Bld: 107 mg/dL — ABNORMAL HIGH (ref 70–99)
Potassium: 4.2 mmol/L (ref 3.5–5.1)
Sodium: 140 mmol/L (ref 135–145)

## 2020-05-06 LAB — GLUCOSE, CAPILLARY: Glucose-Capillary: 170 mg/dL — ABNORMAL HIGH (ref 70–99)

## 2020-05-06 NOTE — Progress Notes (Signed)
[facility]  PROGRESS NOTE  [name] JEH:[id number] DOB: 01-31-1966 DOA: 05/03/2020 PCP: [name], MD Admit date - 05/03/2020   Admitting Physician Roney Jaffe, MD  Outpatient Primary MD for the patient is [name], MD  LOS - 3 Brief Narrative   [name] is a 54 y.o. year old male with medical history significant for HTN, diastolic CHF, CKD stage III, HLD presents on 05/03/20 with blurred vision and left leg swelling and found to have acute CHF exacerbation in the setting of hypertensive urgency related to poor adherence to recently prescribed BP medications.     Subjective  Continues to report improvement in breathing.  Urinating well.  A & P   Acute on chronic diastolic CHF exacerbation in setting of hypertensive emergency.  Blood pressure much better controlled.  Still has some 1+ pitting edema but significantly improved.  Denies any persistent shortness of breath with exertion, shortness of breath at rest.  Net -6.3 L this hospitalization -Tolerating oral Lasix regimen -We will closely monitor BMP given some increased renal insufficiency, will not make any further changes to Lasix regimen if continues to worsen may need diuresis holiday  HTN, blood pressure currently at goal -continue home BP meds including amlodipine 10 mg, hydralazine 100 mg 3 times daily, labetalol 200 mg twice daily  Type 2 diabetes, poorly controlled, A1c 9.5.  CBGs at goal -Home oral hypoglycemic medications held on admission -Monitor CBG, sliding scale as needed  Obesity, class II BMI 38 -Needs outpatient counseling  CKD stage III, baseline 1.5-1.8.  2.2, still maintaining adequate output Likely in the setting of aggressive IV diuresis.  I suspect this may be the peak of his elevated creatinine, will monitor without any additional changes to his oral Lasix regimen -If creatinine is stable or decreases in 24 hours anticipate discharge home -If creatinine worsens may  need diuresis holiday with close monitoring of BMP -Discontinue IV Lasix, transition to oral Lasix, monitor BMP -Avoid nephrotoxins -Monitor output    Family Communication  : None  Code Status : Full  Disposition Plan  :  Patient is from home. Anticipated d/c date:  1 to 2 days barriers to d/c or necessity for inpatient status:  Still requires close monitoring of renal function/creatinine given concurrently on Lasix Consults  :    Procedures  :    DVT Prophylaxis  :  Lovenox   MDM: The below labs and imaging reports were reviewed and summarized above.  Medication management as above.  Lab Results  Component Value Date   PLT 257 05/03/2020    Diet :  Diet Order            Diet Heart Room service appropriate? Yes; Fluid consistency: Thin  Diet effective now                  Inpatient Medications Scheduled Meds: . amLODipine  10 mg Oral Daily  . enoxaparin (LOVENOX) injection  60 mg Subcutaneous Q24H  . furosemide  60 mg Oral BID  . hydrALAZINE  100 mg Oral TID  . labetalol  200 mg Oral BID  . multivitamin with minerals  1 tablet Oral Daily  . pravastatin  40 mg Oral q AM  . sodium chloride flush  3 mL Intravenous Q12H   Continuous Infusions: . sodium chloride     PRN Meds:.sodium chloride, acetaminophen, ondansetron (ZOFRAN) IV, sodium chloride flush  Antibiotics  :   Anti-infectives (From admission, onward)   None  Objective   Vitals:   05/05/20 1451 05/05/20 2215 05/06/20 0621 05/06/20 1415  BP: (!) 147/87 (!) 146/90 (!) 142/84 (!) 144/83  Pulse: 88 92 92 88  Resp: 16 16 20 20   Temp: 98.2 F (36.8 C) 98.9 F (37.2 C) 99.8 F (37.7 C) 98.1 F (36.7 C)  TempSrc: Oral Oral Oral Oral  SpO2: 94% 92% 92% 93%  Weight:   124.9 kg   Height:        SpO2: 93 %  Wt Readings from Last 3 Encounters:  05/06/20 124.9 kg  05/03/20 126.6 kg  04/28/20 127.4 kg     Intake/Output Summary (Last 24 hours) at 05/06/2020 1606 Last data filed at  05/06/2020 1200 Gross per 24 hour  Intake 720 ml  Output 900 ml  Net -180 ml    Physical Exam:     Awake Alert, Oriented X 3, Normal affect No new F.N deficits,  Daisy.AT, Normal respiratory effort on room air, CTAB RRR,No Gallops,Rubs or new Murmurs,  +ve B.Sounds, Abd Soft, No tenderness, No rebound, guarding or rigidity. No Cyanosis, No new Rash or bruise   1+ pitting edema bilateral lower extremities   I have personally reviewed the following:   Data Reviewed:  CBC Recent Labs  Lab 05/03/20 1329  WBC 6.1  HGB 11.0*  HCT 33.8*  PLT 257  MCV 81.8  MCH 26.6  MCHC 32.5  RDW 14.1    Chemistries  Recent Labs  Lab 05/03/20 1329 05/04/20 0655 05/05/20 0624 05/06/20 0641  NA 141 142 141 140  K 4.0 3.7 3.8 4.2  CL 103 103 101 99  CO2 27 28 31 30   GLUCOSE 116* 88 108* 107*  BUN 44* 45* 43* 43*  CREATININE 1.89* 1.88* 2.03* 2.26*  CALCIUM 8.3* 8.2* 8.3* 8.2*   ------------------------------------------------------------------------------------------------------------------ No results for input(s): CHOL, HDL, LDLCALC, TRIG, CHOLHDL, LDLDIRECT in the last 72 hours.  Lab Results  Component Value Date   HGBA1C 9.5 (H) 04/06/2020   ------------------------------------------------------------------------------------------------------------------ No results for input(s): TSH, T4TOTAL, T3FREE, THYROIDAB in the last 72 hours.  Invalid input(s): FREET3 ------------------------------------------------------------------------------------------------------------------ No results for input(s): VITAMINB12, FOLATE, FERRITIN, TIBC, IRON, RETICCTPCT in the last 72 hours.  Coagulation profile Recent Labs  Lab 05/03/20 1353  INR 1.0    No results for input(s): DDIMER in the last 72 hours.  Cardiac Enzymes No results for input(s): CKMB, TROPONINI, MYOGLOBIN in the last 168 hours.  Invalid input(s):  CK ------------------------------------------------------------------------------------------------------------------    Component Value Date/Time   BNP 229.0 (H) 05/03/2020 1329    Micro Results Recent Results (from the past 240 hour(s))  Respiratory Panel by RT PCR (Flu A&B, Covid) - Nasopharyngeal Swab     Status: None   Collection Time: 05/03/20  3:33 PM   Specimen: Nasopharyngeal Swab  Result Value Ref Range Status   SARS Coronavirus 2 by RT PCR NEGATIVE NEGATIVE Final    Comment: (NOTE) SARS-CoV-2 target nucleic acids are NOT DETECTED.  The SARS-CoV-2 RNA is generally detectable in upper respiratoy specimens during the acute phase of infection. The lowest concentration of SARS-CoV-2 viral copies this assay can detect is 131 copies/mL. A negative result does not preclude SARS-Cov-2 infection and should not be used as the sole basis for treatment or other patient management decisions. A negative result may occur with  improper specimen collection/handling, submission of specimen other than nasopharyngeal swab, presence of viral mutation(s) within the areas targeted by this assay, and inadequate number of viral copies (<131 copies/mL). A negative  result must be combined with clinical observations, patient history, and epidemiological information. The expected result is Negative.  Fact Sheet for Patients:  PinkCheek.be  Fact Sheet for Healthcare Providers:  GravelBags.it  This test is no t yet approved or cleared by the [place] FDA and  has been authorized for detection and/or diagnosis of SARS-CoV-2 by FDA under an Emergency Use Authorization (EUA). This EUA will remain  in effect (meaning this test can be used) for the duration of the COVID-19 declaration under Section 564(b)(1) of the Act, 21 U.S.C. section 360bbb-3(b)(1), unless the authorization is terminated or revoked sooner.     Influenza A by PCR  NEGATIVE NEGATIVE Final   Influenza B by PCR NEGATIVE NEGATIVE Final    Comment: (NOTE) The Xpert Xpress SARS-CoV-2/FLU/RSV assay is intended as an aid in  the diagnosis of influenza from Nasopharyngeal swab specimens and  should not be used as a sole basis for treatment. Nasal washings and  aspirates are unacceptable for Xpert Xpress SARS-CoV-2/FLU/RSV  testing.  Fact Sheet for Patients: PinkCheek.be  Fact Sheet for Healthcare Providers: GravelBags.it  This test is not yet approved or cleared by the [place] FDA and  has been authorized for detection and/or diagnosis of SARS-CoV-2 by  FDA under an Emergency Use Authorization (EUA). This EUA will remain  in effect (meaning this test can be used) for the duration of the  Covid-19 declaration under Section 564(b)(1) of the Act, 21  U.S.C. section 360bbb-3(b)(1), unless the authorization is  terminated or revoked. Performed at [facility], [address], [place],  [address]     Radiology Reports CT Head Wo Contrast  Result Date: 05/03/2020 CLINICAL DATA:  Vision loss, binocular. Additional history provided: Blurry vision. EXAM: CT HEAD WITHOUT CONTRAST TECHNIQUE: Contiguous axial images were obtained from the base of the skull through the vertex without intravenous contrast. COMPARISON:  No pertinent prior exams are available for comparison. FINDINGS: Brain: There is no acute intracranial hemorrhage. No demarcated cortical infarct. No extra-axial fluid collection. No evidence of intracranial mass. No midline shift. Partially empty sella turcica. Vascular: No hyperdense vessel. Skull: Normal. Negative for fracture or focal lesion. Sinuses/Orbits: Visualized orbits show no acute finding. Severe ethmoid sinus mucosal thickening. Mild mucosal thickening within the visualized maxillary sinuses. No significant mastoid effusion. IMPRESSION: No evidence of acute intracranial  abnormality. Partially empty sella turcica. This finding is very commonly incidental, but can be associated with idiopathic intracranial hypertension. Paranasal sinus mucosal thickening, most notably ethmoidal. Electronically Signed   By: Kellie Simmering DO   On: 05/03/2020 14:48   US Venous Img Lower Unilateral Left (DVT)  Result Date: 04/21/2020 CLINICAL DATA:  Left lower extremity edema, pain EXAM: LEFT LOWER EXTREMITY VENOUS DOPPLER ULTRASOUND TECHNIQUE: Gray-scale sonography with compression, as well as color and duplex ultrasound, were performed to evaluate the deep venous system(s) from the level of the common femoral vein through the popliteal and proximal calf veins. COMPARISON:  None. FINDINGS: VENOUS Normal compressibility of the common femoral, superficial femoral, and popliteal veins, as well as the visualized calf veins. Visualized portions of profunda femoral vein and great saphenous vein unremarkable. No filling defects to suggest DVT on grayscale or color Doppler imaging. Doppler waveforms show normal direction of venous flow, normal respiratory plasticity and response to augmentation. Limited views of the contralateral common femoral vein are unremarkable. OTHER Edema throughout the subcutaneous soft tissues of the left lower extremity. Mildly prominent left groin lymph node with a short axis diameter of  10 mm. Limitations: none IMPRESSION: No evidence of left lower extremity DVT. Electronically Signed   By: Rolm Baptise M.D.   On: 04/21/2020 15:05   DG Chest Port 1 View  Result Date: 05/03/2020 CLINICAL DATA:  Short of breath.  Chest pain. EXAM: PORTABLE CHEST 1 VIEW COMPARISON:  04/05/2020 FINDINGS: Cardiac enlargement with normal pulmonary vascularity. Improvement in bibasilar atelectasis and small effusions seen previously. No new area of infiltrate. IMPRESSION: Improvement in bibasilar atelectasis and effusion. Negative for edema. Electronically Signed   By: Franchot Gallo M.D.   On:  05/03/2020 13:55     Time Spent in minutes  30     Desiree Hane M.D on 05/06/2020 at 4:06 PM  To page go to [email or web address] - password [facility]

## 2020-05-07 DIAGNOSIS — H539 Unspecified visual disturbance: Secondary | ICD-10-CM

## 2020-05-07 LAB — BASIC METABOLIC PANEL
Anion gap: 9 (ref 5–15)
BUN: 42 mg/dL — ABNORMAL HIGH (ref 6–20)
CO2: 30 mmol/L (ref 22–32)
Calcium: 8.3 mg/dL — ABNORMAL LOW (ref 8.9–10.3)
Chloride: 98 mmol/L (ref 98–111)
Creatinine, Ser: 2.09 mg/dL — ABNORMAL HIGH (ref 0.61–1.24)
GFR, Estimated: 35 mL/min — ABNORMAL LOW (ref >60)
Glucose, Bld: 139 mg/dL — ABNORMAL HIGH (ref 70–99)
Potassium: 3.9 mmol/L (ref 3.5–5.1)
Sodium: 137 mmol/L (ref 135–145)

## 2020-05-07 NOTE — Discharge Instructions (Signed)

## 2020-05-07 NOTE — Progress Notes (Signed)
Spoke with patient and he states that he is going to drive home. MD notified and is okay with pt driving. Condition stable vs stable. Pt denies SOB ir dizziness

## 2020-05-07 NOTE — Discharge Summary (Addendum)
[name]:[id number] DOB: 12-Jan-1966 DOA: 05/03/2020  PCP: Larry Evens, MD  Admit date: 05/03/2020 Discharge date: 05/07/2020  Admitted From: home Disposition:  home  Recommendations for Outpatient Follow-up:  1. Follow up with PCP in 1 week  2. Please obtain BMP (creatinine) in one week 3. Please follow up on the following pending results:  Home Health:none  Equipment/Devices:none  Discharge Condition:Stable CODE STATUS:FULL    Brief/Interim Summary: History of present illness:  [name] is a 54 y.o. year old male with medical history significant for HTN, diastolic CHF, CKD stage III, HLD presents on 05/03/20 with blurred vision and left leg swelling and found to have acute CHF exacerbation in the setting of hypertensive urgency related to poor adherence to recently prescribed BP medications (was unable to pick up labetalol and norvasc after last hopsitalization.   Patient was recently discharged approximately 3 weeks ago (9/13/9/18) for acute diastolic CHF exacerbation with dry weight of 269 and instructed to continue Lasix 60 mg daily.  He was seen by his cardiologist on the morning of admission with endorsement of 2+ pillow orthopnea, lower extremity swelling, shortness of breath with minimal exertion, and weight of 280 pounds, and not been adherent with labetalol and Norvasc (reports he was notable to get dosed with after last discharge) as well as complaint of blurred vision that occurred on onset of day of admission in the morning  In the ED patient was afebrile, tachypneic at a rate of 30, tachycardic with heart rate of 104, blood pressure of 188/102.  Covid test was negative.  Initial creatinine is 1.89 with a BUN of 44.  BNP of 229.  CT head obtained due to report of vision loss showed no evidence of acute intracranial abnormalities and showed partially empty sella turcica (commonly incidental but can be associated with idiopathic intracranial hypertension).   Chest x-ray showed atelectasis and effusion though improved from prior    Remaining hospital course addressed in problem based format below:   Hospital Course:   Acute on chronic diastolic CHF exacerbation in setting of hypertensive emergency, resolved.  Blood pressure much better controlled.  Still has some 1+ pitting edema but significantly improved.  Denies any persistent shortness of breath with exertion and no shortness of breath at rest.  Net - 8.4 L this hospitalization with IV Lasix diuresis and tolerated transition to oral Lasix 60 mg twice daily.  Dry weight on discharge to 68 pounds -Continue home Lasix 60 mg twice daily   HTN, blood pressure currently at goal.  Had elevated blood pressure with hypertensive emergency given reported blurry vision in setting of elevated BP like related to poor adherence with blood pressure medications as addressed above in HPI.  Was able to improve blood pressure control with diuresis and resumption of total blood pressure regimen -continue home BP meds including amlodipine 10 mg, hydralazine 100 mg 3 times daily, labetalol 200 mg twice daily.  Discussed with patient who now has those medications at home after his wife picked them up  Blurred vision in the setting of of hypertensive emergency, resolved with blood pressure control.  Type 2 diabetes, poorly controlled, A1c 9.5.  CBGs at goal -Home oral hypoglycemic medications held on admission -Continue home oral hypoglycemics on discharge  Obesity, class II BMI 38 -Needs outpatient counseling  CKD stage III, baseline 1.5-1.89.    Peak creatinine on admission of 2.2 likely occurred in the setting of aggressive IV diuresis.  Will transition to oral patient's creatinine down  trended to 2.09 on day of discharge.  Patient maintain adequate urinary output and did not have any other parameters of BMP concerning for renal insufficiency (i.e. no hyperkalemia, no metabolic acidosis) -Given creatinine has  down trended advised patient to continue oral Lasix regimen and have close follow-up with his PCP -Advised BMP check with PCP in 1 week    Consultations:  None  Procedures/Studies: None Subjective: Feels well, no chest pain.  No shortness of breath. Discharge Exam: Vitals:   05/07/20 0434 05/07/20 1216  BP: (!) 143/86 (!) 144/76  Pulse: 87 92  Resp: 16   Temp: 98.2 F (36.8 C)   SpO2: 91%    Vitals:   05/06/20 2128 05/07/20 0434 05/07/20 0500 05/07/20 1216  BP: (!) 150/88 (!) 143/86  (!) 144/76  Pulse: 89 87  92  Resp: 20 16    Temp: 98.9 F (37.2 C) 98.2 F (36.8 C)    TempSrc:  Oral    SpO2: 100% 91%    Weight:   121.7 kg   Height:        General: Sitting in bedside chair, no apparent distress Eyes: EOMI, anicteric ENT: Oral Mucosa clear and moist Cardiovascular: regular rate and rhythm, no murmurs, rubs or gallops, 1+ pitting edema bilateral lower extremities to ankles, (significantly improved) Respiratory: Normal respiratory effort on room air, lungs clear to auscultation bilaterally Abdomen: soft, non-distended, non-tender, normal bowel sounds Skin: No Rash Neurologic: Grossly no focal neuro deficit.Mental status AAOx3, speech normal, Psychiatric:Appropriate affect, and mood  Discharge Diagnoses:  Principal Problem:   Acute on chronic diastolic CHF (congestive heart failure) (HCC) Active Problems:   Bilateral leg edema   Essential hypertension   Type 2 diabetes with nephropathy (HCC)   CKD (chronic kidney disease) stage 3, GFR 30-59 ml/min (HCC)   Hypertensive urgency   Blurred vision, bilateral   Acute on chronic diastolic (congestive) heart failure [facility])    Discharge Instructions  Discharge Instructions    Diet - low sodium heart healthy   Complete by: As directed    Increase activity slowly   Complete by: As directed      Allergies as of 05/07/2020   No Known Allergies     Medication List    TAKE these medications   amLODipine 10 MG  tablet Commonly known as: NORVASC Take 1 tablet (10 mg total) by mouth daily.   furosemide 40 MG tablet Commonly known as: Lasix Take 1.5 tablets (60 mg total) by mouth 2 (two) times daily.   glimepiride 4 MG tablet Commonly known as: Amaryl Take 1 tablet (4 mg total) by mouth daily before breakfast. What changed: when to take this   hydrALAZINE 100 MG tablet Commonly known as: APRESOLINE Take 1 tablet (100 mg total) by mouth 3 (three) times daily.   labetalol 200 MG tablet Commonly known as: NORMODYNE Take 1 tablet (200 mg total) by mouth 2 (two) times daily.   metFORMIN 500 MG 24 hr tablet Commonly known as: GLUCOPHAGE-XR Take 2 tablets (1,000 mg total) by mouth in the morning and at bedtime.   multivitamin with minerals Tabs tablet Take 1 tablet by mouth daily.   pravastatin 40 MG tablet Commonly known as: PRAVACHOL Take 40 mg by mouth in the morning.       No Known Allergies      The results of significant diagnostics from this hospitalization (including imaging, microbiology, ancillary and laboratory) are listed below for reference.     Microbiology: Recent Results (from  the past 240 hour(s))  Respiratory Panel by RT PCR (Flu A&B, Covid) - Nasopharyngeal Swab     Status: None   Collection Time: 05/03/20  3:33 PM   Specimen: Nasopharyngeal Swab  Result Value Ref Range Status   SARS Coronavirus 2 by RT PCR NEGATIVE NEGATIVE Final    Comment: (NOTE) SARS-CoV-2 target nucleic acids are NOT DETECTED.  The SARS-CoV-2 RNA is generally detectable in upper respiratoy specimens during the acute phase of infection. The lowest concentration of SARS-CoV-2 viral copies this assay can detect is 131 copies/mL. A negative result does not preclude SARS-Cov-2 infection and should not be used as the sole basis for treatment or other patient management decisions. A negative result may occur with  improper specimen collection/handling, submission of specimen other than  nasopharyngeal swab, presence of viral mutation(s) within the areas targeted by this assay, and inadequate number of viral copies (<131 copies/mL). A negative result must be combined with clinical observations, patient history, and epidemiological information. The expected result is Negative.  Fact Sheet for Patients:  PinkCheek.be  Fact Sheet for Healthcare Providers:  GravelBags.it  This test is no t yet approved or cleared by the [place] FDA and  has been authorized for detection and/or diagnosis of SARS-CoV-2 by FDA under an Emergency Use Authorization (EUA). This EUA will remain  in effect (meaning this test can be used) for the duration of the COVID-19 declaration under Section 564(b)(1) of the Act, 21 U.S.C. section 360bbb-3(b)(1), unless the authorization is terminated or revoked sooner.     Influenza A by PCR NEGATIVE NEGATIVE Final   Influenza B by PCR NEGATIVE NEGATIVE Final    Comment: (NOTE) The Xpert Xpress SARS-CoV-2/FLU/RSV assay is intended as an aid in  the diagnosis of influenza from Nasopharyngeal swab specimens and  should not be used as a sole basis for treatment. Nasal washings and  aspirates are unacceptable for Xpert Xpress SARS-CoV-2/FLU/RSV  testing.  Fact Sheet for Patients: PinkCheek.be  Fact Sheet for Healthcare Providers: GravelBags.it  This test is not yet approved or cleared by the [place] FDA and  has been authorized for detection and/or diagnosis of SARS-CoV-2 by  FDA under an Emergency Use Authorization (EUA). This EUA will remain  in effect (meaning this test can be used) for the duration of the  Covid-19 declaration under Section 564(b)(1) of the Act, 21  U.S.C. section 360bbb-3(b)(1), unless the authorization is  terminated or revoked. Performed at [facility], [address], [place], [place] [address]       Labs: BNP (last 3 results) Recent Labs    04/05/20 1350 05/03/20 1329  BNP 311.0* 517.6*   Basic Metabolic Panel: Recent Labs  Lab 05/03/20 1329 05/04/20 0655 05/05/20 0624 05/06/20 0641 05/07/20 0530  NA 141 142 141 140 137  K 4.0 3.7 3.8 4.2 3.9  CL 103 103 101 99 98  CO2 27 28 31 30 30   GLUCOSE 116* 88 108* 107* 139*  BUN 44* 45* 43* 43* 42*  CREATININE 1.89* 1.88* 2.03* 2.26* 2.09*  CALCIUM 8.3* 8.2* 8.3* 8.2* 8.3*   Liver Function Tests: No results for input(s): AST, ALT, ALKPHOS, BILITOT, PROT, ALBUMIN in the last 168 hours. No results for input(s): LIPASE, AMYLASE in the last 168 hours. No results for input(s): AMMONIA in the last 168 hours. CBC: Recent Labs  Lab 05/03/20 1329  WBC 6.1  HGB 11.0*  HCT 33.8*  MCV 81.8  PLT 257   Cardiac Enzymes: No results for input(s):  CKTOTAL, CKMB, CKMBINDEX, TROPONINI in the last 168 hours. BNP: Invalid input(s): POCBNP CBG: Recent Labs  Lab 05/05/20 0722 05/05/20 1120 05/05/20 1619 05/05/20 2217 05/06/20 1619  GLUCAP 95 149* 173* 146* 170*   D-Dimer No results for input(s): DDIMER in the last 72 hours. Hgb A1c No results for input(s): HGBA1C in the last 72 hours. Lipid Profile No results for input(s): CHOL, HDL, LDLCALC, TRIG, CHOLHDL, LDLDIRECT in the last 72 hours. Thyroid function studies No results for input(s): TSH, T4TOTAL, T3FREE, THYROIDAB in the last 72 hours.  Invalid input(s): FREET3 Anemia work up No results for input(s): VITAMINB12, FOLATE, FERRITIN, TIBC, IRON, RETICCTPCT in the last 72 hours. Urinalysis No results found for: COLORURINE, APPEARANCEUR, [place], [place], [place], [place], [place], [place], PROTEINUR, UROBILINOGEN, NITRITE, LEUKOCYTESUR Sepsis Labs Invalid input(s): PROCALCITONIN,  WBC,  LACTICIDVEN Microbiology Recent Results (from the past 240 hour(s))  Respiratory Panel by RT PCR (Flu A&B, Covid) - Nasopharyngeal Swab     Status: None   Collection Time: 05/03/20   3:33 PM   Specimen: Nasopharyngeal Swab  Result Value Ref Range Status   SARS Coronavirus 2 by RT PCR NEGATIVE NEGATIVE Final    Comment: (NOTE) SARS-CoV-2 target nucleic acids are NOT DETECTED.  The SARS-CoV-2 RNA is generally detectable in upper respiratoy specimens during the acute phase of infection. The lowest concentration of SARS-CoV-2 viral copies this assay can detect is 131 copies/mL. A negative result does not preclude SARS-Cov-2 infection and should not be used as the sole basis for treatment or other patient management decisions. A negative result may occur with  improper specimen collection/handling, submission of specimen other than nasopharyngeal swab, presence of viral mutation(s) within the areas targeted by this assay, and inadequate number of viral copies (<131 copies/mL). A negative result must be combined with clinical observations, patient history, and epidemiological information. The expected result is Negative.  Fact Sheet for Patients:  PinkCheek.be  Fact Sheet for Healthcare Providers:  GravelBags.it  This test is no t yet approved or cleared by the [place] FDA and  has been authorized for detection and/or diagnosis of SARS-CoV-2 by FDA under an Emergency Use Authorization (EUA). This EUA will remain  in effect (meaning this test can be used) for the duration of the COVID-19 declaration under Section 564(b)(1) of the Act, 21 U.S.C. section 360bbb-3(b)(1), unless the authorization is terminated or revoked sooner.     Influenza A by PCR NEGATIVE NEGATIVE Final   Influenza B by PCR NEGATIVE NEGATIVE Final    Comment: (NOTE) The Xpert Xpress SARS-CoV-2/FLU/RSV assay is intended as an aid in  the diagnosis of influenza from Nasopharyngeal swab specimens and  should not be used as a sole basis for treatment. Nasal washings and  aspirates are unacceptable for Xpert Xpress SARS-CoV-2/FLU/RSV   testing.  Fact Sheet for Patients: PinkCheek.be  Fact Sheet for Healthcare Providers: GravelBags.it  This test is not yet approved or cleared by the [place] FDA and  has been authorized for detection and/or diagnosis of SARS-CoV-2 by  FDA under an Emergency Use Authorization (EUA). This EUA will remain  in effect (meaning this test can be used) for the duration of the  Covid-19 declaration under Section 564(b)(1) of the Act, 21  U.S.C. section 360bbb-3(b)(1), unless the authorization is  terminated or revoked. Performed at [facility], [address], [place], [place] [address]      Time coordinating discharge: Over 30 minutes  SIGNED:   Desiree Hane, MD  [facility] 05/07/2020, 6:00  PM Pager   If 7PM-7AM, please contact night-coverage [email or web address] Password [email or web address]

## 2020-05-07 NOTE — Plan of Care (Signed)
Pt for d/c home verbalized understanding of instructions given

## 2020-05-07 NOTE — Progress Notes (Signed)
Patient called wife to come and pick him up.

## 2020-05-10 ENCOUNTER — Telehealth: Payer: Self-pay

## 2020-05-10 NOTE — Telephone Encounter (Signed)
Notes faxed to [facility] for [facility].   [phone]

## 2020-06-02 NOTE — Telephone Encounter (Signed)
I called pt to ask if he has heard from the [facility]. No answer.

## 2020-06-09 ENCOUNTER — Telehealth: Payer: Self-pay | Admitting: Physician Assistant

## 2020-06-09 NOTE — Telephone Encounter (Signed)
Patient informed and verbalized understanding.  Encouraged patient to go to the ED-patient said he would go in the morning.

## 2020-06-09 NOTE — Telephone Encounter (Signed)
Agree with ER evaluation, if is not seen in ER increase lasix to 80mg  bid until his appt with Sander Radon MD

## 2020-06-09 NOTE — Telephone Encounter (Signed)
Reports increased swelling all over body, increased SOB. Reports having chest pain and dizziness at times. Reports BP and HR was checked by EMS today and was told his vitals were good but unable to give numbers. Wife said, "We were told by EMS to contact cardiologist because he just needed to get the fluid off".  Per wife, patient has no available home weights. Denies active chest pain or dizziness. Medications reviewed. Vitals taken while on phone and BP 138/72 HR 89 weight 280 lbs. Normal weight 275 lbs   Advised that patient should be doing daily weights. Given first available appointment with Ermalinda Barrios PA on 06/15/2020 @1 :45 pm and advised to go to the ED now for an evaluation because he could be fluid overload from heart as well as kidney. Wife verbalized understanding of plan.

## 2020-06-09 NOTE — Telephone Encounter (Signed)
New message     Pt c/o swelling: STAT is pt has developed SOB within 24 hours  1) How much weight have you gained and in what time span?  Not sure   2) If swelling, where is the swelling located? Feet legs , everywhere   3) Are you currently taking a fluid pill?  Low dose fluid pill  4) Are you currently SOB?  Yes , called the emts they said its because he is retaining fluids   5) Do you have a log of your daily weights (if so, list)?  no  Have you gained 3 pounds in a day or 5 pounds in a week?  Not sure  6) Have you traveled recently?  no

## 2020-06-14 ENCOUNTER — Other Ambulatory Visit: Payer: Self-pay

## 2020-06-14 ENCOUNTER — Inpatient Hospital Stay (HOSPITAL_COMMUNITY)
Admission: EM | Admit: 2020-06-14 | Discharge: 2020-06-21 | DRG: 291 | Disposition: A | Discharge status: Home / Self Care | Payer: Commercial Managed Care - PPO | Attending: Internal Medicine | Admitting: Internal Medicine

## 2020-06-14 ENCOUNTER — Encounter (HOSPITAL_COMMUNITY): Payer: Self-pay | Admitting: *Deleted

## 2020-06-14 ENCOUNTER — Emergency Department (HOSPITAL_COMMUNITY): Payer: Commercial Managed Care - PPO

## 2020-06-14 DIAGNOSIS — I5033 Acute on chronic diastolic (congestive) heart failure: Secondary | ICD-10-CM | POA: Diagnosis present

## 2020-06-14 DIAGNOSIS — I10 Essential (primary) hypertension: Secondary | ICD-10-CM | POA: Diagnosis not present

## 2020-06-14 DIAGNOSIS — G4733 Obstructive sleep apnea (adult) (pediatric): Secondary | ICD-10-CM | POA: Diagnosis present

## 2020-06-14 DIAGNOSIS — J9601 Acute respiratory failure with hypoxia: Secondary | ICD-10-CM | POA: Diagnosis present

## 2020-06-14 DIAGNOSIS — Z833 Family history of diabetes mellitus: Secondary | ICD-10-CM | POA: Diagnosis not present

## 2020-06-14 DIAGNOSIS — E1121 Type 2 diabetes mellitus with diabetic nephropathy: Secondary | ICD-10-CM | POA: Diagnosis present

## 2020-06-14 DIAGNOSIS — I130 Hypertensive heart and chronic kidney disease with heart failure and stage 1 through stage 4 chronic kidney disease, or unspecified chronic kidney disease: Principal | ICD-10-CM | POA: Diagnosis present

## 2020-06-14 DIAGNOSIS — E1149 Type 2 diabetes mellitus with other diabetic neurological complication: Secondary | ICD-10-CM | POA: Diagnosis present

## 2020-06-14 DIAGNOSIS — Z6841 Body Mass Index (BMI) 40.0 and over, adult: Secondary | ICD-10-CM | POA: Diagnosis not present

## 2020-06-14 DIAGNOSIS — G629 Polyneuropathy, unspecified: Secondary | ICD-10-CM | POA: Diagnosis present

## 2020-06-14 DIAGNOSIS — E46 Unspecified protein-calorie malnutrition: Secondary | ICD-10-CM | POA: Diagnosis not present

## 2020-06-14 DIAGNOSIS — R609 Edema, unspecified: Secondary | ICD-10-CM | POA: Diagnosis present

## 2020-06-14 DIAGNOSIS — E669 Obesity, unspecified: Secondary | ICD-10-CM | POA: Diagnosis not present

## 2020-06-14 DIAGNOSIS — N1832 Chronic kidney disease, stage 3b: Secondary | ICD-10-CM | POA: Diagnosis present

## 2020-06-14 DIAGNOSIS — Z79899 Other long term (current) drug therapy: Secondary | ICD-10-CM

## 2020-06-14 DIAGNOSIS — N183 Chronic kidney disease, stage 3 unspecified: Secondary | ICD-10-CM | POA: Diagnosis present

## 2020-06-14 DIAGNOSIS — E441 Mild protein-calorie malnutrition: Secondary | ICD-10-CM | POA: Diagnosis present

## 2020-06-14 DIAGNOSIS — E1165 Type 2 diabetes mellitus with hyperglycemia: Secondary | ICD-10-CM | POA: Diagnosis present

## 2020-06-14 DIAGNOSIS — I509 Heart failure, unspecified: Secondary | ICD-10-CM

## 2020-06-14 DIAGNOSIS — E785 Hyperlipidemia, unspecified: Secondary | ICD-10-CM | POA: Diagnosis present

## 2020-06-14 DIAGNOSIS — Z20822 Contact with and (suspected) exposure to covid-19: Secondary | ICD-10-CM | POA: Diagnosis present

## 2020-06-14 DIAGNOSIS — J189 Pneumonia, unspecified organism: Secondary | ICD-10-CM | POA: Diagnosis present

## 2020-06-14 DIAGNOSIS — Z7984 Long term (current) use of oral hypoglycemic drugs: Secondary | ICD-10-CM | POA: Diagnosis not present

## 2020-06-14 DIAGNOSIS — E1122 Type 2 diabetes mellitus with diabetic chronic kidney disease: Secondary | ICD-10-CM | POA: Diagnosis present

## 2020-06-14 DIAGNOSIS — N1831 Chronic kidney disease, stage 3a: Secondary | ICD-10-CM | POA: Diagnosis not present

## 2020-06-14 LAB — CBC WITH DIFFERENTIAL/PLATELET
Abs Immature Granulocytes: 0.01 10*3/uL (ref 0.00–0.07)
Basophils Absolute: 0 10*3/uL (ref 0.0–0.1)
Basophils Relative: 1 %
Eosinophils Absolute: 0.1 10*3/uL (ref 0.0–0.5)
Eosinophils Relative: 2 %
HCT: 30.2 % — ABNORMAL LOW (ref 39.0–52.0)
Hemoglobin: 9.7 g/dL — ABNORMAL LOW (ref 13.0–17.0)
Immature Granulocytes: 0 %
Lymphocytes Relative: 22 %
Lymphs Abs: 1.1 10*3/uL (ref 0.7–4.0)
MCH: 26.6 pg (ref 26.0–34.0)
MCHC: 32.1 g/dL (ref 30.0–36.0)
MCV: 82.7 fL (ref 80.0–100.0)
Monocytes Absolute: 0.5 10*3/uL (ref 0.1–1.0)
Monocytes Relative: 11 %
Neutro Abs: 3.2 10*3/uL (ref 1.7–7.7)
Neutrophils Relative %: 64 %
Platelets: 272 10*3/uL (ref 150–400)
RBC: 3.65 MIL/uL — ABNORMAL LOW (ref 4.22–5.81)
RDW: 14.9 % (ref 11.5–15.5)
WBC: 5 10*3/uL (ref 4.0–10.5)
nRBC: 0 % (ref 0.0–0.2)

## 2020-06-14 LAB — COMPREHENSIVE METABOLIC PANEL
ALT: 29 U/L (ref 0–44)
AST: 20 U/L (ref 15–41)
Albumin: 3.4 g/dL — ABNORMAL LOW (ref 3.5–5.0)
Alkaline Phosphatase: 110 U/L (ref 38–126)
Anion gap: 8 (ref 5–15)
BUN: 43 mg/dL — ABNORMAL HIGH (ref 6–20)
CO2: 25 mmol/L (ref 22–32)
Calcium: 8.3 mg/dL — ABNORMAL LOW (ref 8.9–10.3)
Chloride: 107 mmol/L (ref 98–111)
Creatinine, Ser: 2.09 mg/dL — ABNORMAL HIGH (ref 0.61–1.24)
GFR, Estimated: 37 mL/min — ABNORMAL LOW (ref >60)
Glucose, Bld: 116 mg/dL — ABNORMAL HIGH (ref 70–99)
Potassium: 4.6 mmol/L (ref 3.5–5.1)
Sodium: 140 mmol/L (ref 135–145)
Total Bilirubin: 0.4 mg/dL (ref 0.3–1.2)
Total Protein: 6.4 g/dL — ABNORMAL LOW (ref 6.5–8.1)

## 2020-06-14 LAB — URINALYSIS, ROUTINE W REFLEX MICROSCOPIC
Bacteria, UA: NONE SEEN
Bilirubin Urine: NEGATIVE
Glucose, UA: NEGATIVE mg/dL
Hgb urine dipstick: NEGATIVE
Ketones, ur: NEGATIVE mg/dL
Leukocytes,Ua: NEGATIVE
Nitrite: NEGATIVE
Protein, ur: 100 mg/dL — AB
Specific Gravity, Urine: 1.01 (ref 1.005–1.030)
pH: 5 (ref 5.0–8.0)

## 2020-06-14 LAB — BRAIN NATRIURETIC PEPTIDE: B Natriuretic Peptide: 266 pg/mL — ABNORMAL HIGH (ref 0.0–100.0)

## 2020-06-14 LAB — RESP PANEL BY RT-PCR (FLU A&B, COVID) ARPGX2
Influenza A by PCR: NEGATIVE
Influenza B by PCR: NEGATIVE
SARS Coronavirus 2 by RT PCR: NEGATIVE

## 2020-06-14 LAB — TROPONIN I (HIGH SENSITIVITY)
Troponin I (High Sensitivity): 10 ng/L (ref <18)
Troponin I (High Sensitivity): 9 ng/L (ref <18)

## 2020-06-14 MED ORDER — INSULIN ASPART 100 UNIT/ML ~~LOC~~ SOLN
0.0000 [IU] | Freq: Three times a day (TID) | SUBCUTANEOUS | Status: DC
  Administered 2020-06-15: 1 [IU] via SUBCUTANEOUS

## 2020-06-14 MED ORDER — HYDRALAZINE HCL 25 MG PO TABS
100.0000 mg | ORAL_TABLET | Freq: Three times a day (TID) | ORAL | Status: DC
  Administered 2020-06-15 – 2020-06-21 (×19): 100 mg via ORAL
  Filled 2020-06-14 (×4): qty 4
  Filled 2020-06-14: qty 2
  Filled 2020-06-14 (×8): qty 4
  Filled 2020-06-14 (×2): qty 2
  Filled 2020-06-14 (×2): qty 4
  Filled 2020-06-14: qty 2
  Filled 2020-06-14 (×2): qty 4
  Filled 2020-06-14: qty 2
  Filled 2020-06-14 (×4): qty 4

## 2020-06-14 MED ORDER — POLYETHYLENE GLYCOL 3350 17 G PO PACK: 17.0000 g | PACK | Freq: Every day | ORAL | Status: DC | PRN

## 2020-06-14 MED ORDER — FUROSEMIDE 10 MG/ML IJ SOLN
60.0000 mg | Freq: Once | INTRAMUSCULAR | Status: AC
  Administered 2020-06-14: 60 mg via INTRAVENOUS
  Filled 2020-06-14: qty 6

## 2020-06-14 MED ORDER — HEPARIN SODIUM (PORCINE) 5000 UNIT/ML IJ SOLN
5000.0000 [IU] | Freq: Three times a day (TID) | INTRAMUSCULAR | Status: DC
  Administered 2020-06-14 – 2020-06-21 (×20): 5000 [IU] via SUBCUTANEOUS
  Filled 2020-06-14 (×20): qty 1

## 2020-06-14 MED ORDER — INSULIN ASPART 100 UNIT/ML ~~LOC~~ SOLN: 0.0000 [IU] | Freq: Every day | SUBCUTANEOUS | Status: DC

## 2020-06-14 MED ORDER — ONDANSETRON HCL 4 MG/2ML IJ SOLN: 4.0000 mg | Freq: Four times a day (QID) | INTRAMUSCULAR | Status: DC | PRN

## 2020-06-14 MED ORDER — SODIUM CHLORIDE 0.9 % IV SOLN
500.0000 mg | Freq: Once | INTRAVENOUS | Status: AC
  Administered 2020-06-14: 500 mg via INTRAVENOUS
  Filled 2020-06-14: qty 500

## 2020-06-14 MED ORDER — FUROSEMIDE 10 MG/ML IJ SOLN
60.0000 mg | Freq: Every day | INTRAMUSCULAR | Status: DC
  Administered 2020-06-15: 60 mg via INTRAVENOUS
  Filled 2020-06-14: qty 6

## 2020-06-14 MED ORDER — ONDANSETRON HCL 4 MG PO TABS: 4.0000 mg | ORAL_TABLET | Freq: Four times a day (QID) | ORAL | Status: DC | PRN

## 2020-06-14 MED ORDER — ADULT MULTIVITAMIN W/MINERALS CH
1.0000 | ORAL_TABLET | Freq: Every day | ORAL | Status: DC
  Administered 2020-06-15 – 2020-06-21 (×7): 1 via ORAL
  Filled 2020-06-14 (×7): qty 1

## 2020-06-14 MED ORDER — OXYCODONE HCL 5 MG PO TABS: 5.0000 mg | ORAL_TABLET | ORAL | Status: DC | PRN

## 2020-06-14 MED ORDER — SODIUM CHLORIDE 0.9 % IV SOLN
1.0000 g | Freq: Once | INTRAVENOUS | Status: AC
  Administered 2020-06-14: 1 g via INTRAVENOUS
  Filled 2020-06-14: qty 10

## 2020-06-14 MED ORDER — SODIUM CHLORIDE 0.9 % IV SOLN
2.0000 g | INTRAVENOUS | Status: DC
  Administered 2020-06-15 – 2020-06-18 (×4): 2 g via INTRAVENOUS
  Filled 2020-06-14 (×5): qty 20

## 2020-06-14 MED ORDER — PRAVASTATIN SODIUM 40 MG PO TABS
40.0000 mg | ORAL_TABLET | Freq: Every day | ORAL | Status: DC
  Administered 2020-06-15 – 2020-06-21 (×7): 40 mg via ORAL
  Filled 2020-06-14 (×7): qty 1

## 2020-06-14 MED ORDER — SODIUM CHLORIDE 0.9 % IV SOLN
500.0000 mg | INTRAVENOUS | Status: DC
  Administered 2020-06-15 – 2020-06-17 (×3): 500 mg via INTRAVENOUS
  Filled 2020-06-14 (×3): qty 500

## 2020-06-14 MED ORDER — ACETAMINOPHEN 650 MG RE SUPP: 650.0000 mg | Freq: Four times a day (QID) | RECTAL | Status: DC | PRN

## 2020-06-14 MED ORDER — LABETALOL HCL 200 MG PO TABS
200.0000 mg | ORAL_TABLET | Freq: Two times a day (BID) | ORAL | Status: DC
  Administered 2020-06-14 – 2020-06-21 (×14): 200 mg via ORAL
  Filled 2020-06-14 (×14): qty 1

## 2020-06-14 MED ORDER — ACETAMINOPHEN 325 MG PO TABS: 650.0000 mg | ORAL_TABLET | Freq: Four times a day (QID) | ORAL | Status: DC | PRN

## 2020-06-14 MED ORDER — AMLODIPINE BESYLATE 5 MG PO TABS
10.0000 mg | ORAL_TABLET | Freq: Every day | ORAL | Status: DC
  Administered 2020-06-15 – 2020-06-21 (×7): 10 mg via ORAL
  Filled 2020-06-14 (×7): qty 2

## 2020-06-14 NOTE — ED Provider Notes (Signed)
[facility] EMERGENCY DEPARTMENT Provider Note   CSN: [id number] Arrival date & time: 06/14/20  1354     History Chief Complaint  Patient presents with  . Shortness of Breath    SHEENA SIMONIS is a 54 y.o. male.  HPI      CRIAG WICKLUND is a 54 y.o. male, with a history of CHF, DM, HTN, presenting to the ED with shortness of breath and lower lower extremity edema. Patient states he has had increasing shortness of breath over the last couple months, but much worse starting this morning.  Increased lower extremity edema over the last week.  He states he went to his cardiologist's office today because he thought he had his appointment today (it is actually tomorrow).  His wife brought him to the ED because he was so short of breath he was unable to walk. He voices compliance with his medications, including his Lasix. He has had Covid vaccination.  Denies fever/chills, acute cough, chest pain, abdominal pain, N/V/D, or any other complaints.   Past Medical History:  Diagnosis Date  . CHF (congestive heart failure) ([place])   . Diabetes mellitus   . Hypertension     Patient Active Problem List   Diagnosis Date Noted  . CHF exacerbation ([place]) 06/14/2020  . CKD (chronic kidney disease) stage 3, GFR 30-59 ml/min (H[facility]) 05/03/2020  . Hypertensive urgency 05/03/2020  . Blurred vision, bilateral 05/03/2020  . Acute on chronic diastolic (congestive) heart failure ([place]) 05/03/2020  . Essential hypertension   . Type 2 diabetes with nephropathy ([place])   . Acute on chronic diastolic CHF (congestive heart failure) ([place])   . Acute renal failure superimposed on stage 3a chronic kidney disease ([place])   . Class 2 obesity   . Bilateral leg edema 04/05/2020    Past Surgical History:  Procedure Laterality Date  . INCISION AND DRAINAGE    . KNEE SURGERY         Family History  Problem Relation Age of Onset  . Dementia Mother   . Diabetes Father     Social History   Tobacco Use   . Smoking status: Never Smoker  . Smokeless tobacco: Never Used  Vaping Use  . Vaping Use: Never used  Substance Use Topics  . Alcohol use: No  . Drug use: No    Home Medications Prior to Admission medications   Medication Sig Start Date End Date Taking? Authorizing Provider  amLODipine (NORVASC) 10 MG tablet Take 1 tablet (10 mg total) by mouth daily. 04/28/20  Yes Imogene Burn, PA-C  furosemide (LASIX) 40 MG tablet Take 1.5 tablets (60 mg total) by mouth 2 (two) times daily. 04/28/20 07/27/20 Yes Imogene Burn, PA-C  glimepiride (AMARYL) 4 MG tablet Take 1 tablet (4 mg total) by mouth daily before breakfast. Patient taking differently: Take 4 mg by mouth in the morning and at bedtime.  07/24/11 06/14/20 Yes [name], MD  hydrALAZINE (APRESOLINE) 100 MG tablet Take 1 tablet (100 mg total) by mouth 3 (three) times daily. 04/10/20  Yes Barton Dubois, MD  labetalol (NORMODYNE) 200 MG tablet Take 1 tablet (200 mg total) by mouth 2 (two) times daily. 04/28/20  Yes Imogene Burn, PA-C  metFORMIN (GLUCOPHAGE-XR) 500 MG 24 hr tablet Take 2 tablets (1,000 mg total) by mouth in the morning and at bedtime. 04/10/20  Yes Barton Dubois, MD  Multiple Vitamin (MULTIVITAMIN WITH MINERALS) TABS tablet Take 1 tablet by mouth daily.   Yes [provider]  pioglitazone (ACTOS) 30 MG tablet Take 30 mg by mouth daily. 05/11/20  Yes [provider]  pravastatin (PRAVACHOL) 40 MG tablet Take 40 mg by mouth in the morning.    Yes [provider]    Allergies    Patient has no known allergies.  Review of Systems   Review of Systems  Constitutional: Negative for chills, diaphoresis and fever.  Respiratory: Positive for shortness of breath. Negative for cough.   Cardiovascular: Positive for leg swelling. Negative for chest pain.  Gastrointestinal: Negative for abdominal pain, diarrhea, nausea and vomiting.  Genitourinary: Negative for difficulty urinating and dysuria.   Neurological: Negative for dizziness, syncope, weakness and numbness.  All other systems reviewed and are negative.    Physical Exam Updated Vital Signs BP 111/64 (BP Location: Right Arm)   Pulse 85   Temp 98 F (36.7 C) (Oral)   Resp 18   Ht 5\' 11"  (1.803 m)   Wt 127 kg   SpO2 92%   BMI 39.05 kg/m   Physical Exam Vitals and nursing note reviewed.  Constitutional:      General: He is not in acute distress.    Appearance: He is well-developed. He is not diaphoretic.  HENT:     Head: Normocephalic and atraumatic.     Mouth/Throat:     Mouth: Mucous membranes are moist.     Pharynx: Oropharynx is clear.  Eyes:     Conjunctiva/sclera: Conjunctivae normal.  Cardiovascular:     Rate and Rhythm: Normal rate and regular rhythm.     Pulses: Normal pulses.          Radial pulses are 2+ on the right side and 2+ on the left side.       Posterior tibial pulses are 2+ on the right side and 2+ on the left side.     Heart sounds: Normal heart sounds.     Comments: Tactile temperature in the extremities appropriate and equal bilaterally. Pulmonary:     Effort: Tachypnea present.     Breath sounds: Normal breath sounds.     Comments: Increased work of breathing and tachypnea with even mild exertion, such as shifting positions in bed. Abdominal:     Palpations: Abdomen is soft.     Tenderness: There is no abdominal tenderness. There is no guarding.  Musculoskeletal:     Cervical back: Neck supple.     Right lower leg: 2+ Pitting Edema present.     Left lower leg: 2+ Pitting Edema present.  Lymphadenopathy:     Cervical: No cervical adenopathy.  Skin:    General: Skin is warm and dry.  Neurological:     Mental Status: He is alert.  Psychiatric:        Mood and Affect: Mood and affect normal.        Speech: Speech normal.        Behavior: Behavior normal.     ED Results / Procedures / Treatments   Labs (all labs ordered are listed, but only abnormal results are  displayed) Labs Reviewed  COMPREHENSIVE METABOLIC PANEL - Abnormal; Notable for the following components:      Result Value   Glucose, Bld 116 (*)    BUN 43 (*)    Creatinine, Ser 2.09 (*)    Calcium 8.3 (*)    Total Protein 6.4 (*)    Albumin 3.4 (*)    GFR, Estimated 37 (*)    All other components within normal  limits  BRAIN NATRIURETIC PEPTIDE - Abnormal; Notable for the following components:   B Natriuretic Peptide 266.0 (*)    All other components within normal limits  CBC WITH DIFFERENTIAL/PLATELET - Abnormal; Notable for the following components:   RBC 3.65 (*)    Hemoglobin 9.7 (*)    HCT 30.2 (*)    All other components within normal limits  RESP PANEL BY RT-PCR (FLU A&B, COVID) ARPGX2  URINALYSIS, ROUTINE W REFLEX MICROSCOPIC  TROPONIN I (HIGH SENSITIVITY)  TROPONIN I (HIGH SENSITIVITY)    Hemoglobin  Date Value Ref Range Status  06/14/2020 9.7 (L) 13.0 - 17.0 g/dL Final  05/03/2020 11.0 (L) 13.0 - 17.0 g/dL Final  04/07/2020 10.8 (L) 13.0 - 17.0 g/dL Final  04/06/2020 10.5 (L) 13.0 - 17.0 g/dL Final    BUN  Date Value Ref Range Status  06/14/2020 43 (H) 6 - 20 mg/dL Final  05/07/2020 42 (H) 6 - 20 mg/dL Final  05/06/2020 43 (H) 6 - 20 mg/dL Final  05/05/2020 43 (H) 6 - 20 mg/dL Final  04/28/2020 34 (H) 6 - 24 mg/dL Final   Creatinine, Ser  Date Value Ref Range Status  06/14/2020 2.09 (H) 0.61 - 1.24 mg/dL Final  05/07/2020 2.09 (H) 0.61 - 1.24 mg/dL Final  05/06/2020 2.26 (H) 0.61 - 1.24 mg/dL Final  05/05/2020 2.03 (H) 0.61 - 1.24 mg/dL Final     EKG EKG Interpretation  Date/Time:  Monday June 14 2020 14:13:04 EST Ventricular Rate:  84 PR Interval:    QRS Duration: 107 QT Interval:  408 QTC Calculation: 483 R Axis:   98 Text Interpretation: Sinus rhythm Borderline right axis deviation Nonspecific T abnormalities, lateral leads Borderline prolonged QT interval No significant change since last tracing Confirmed by Fredia Sorrow [phone]) on  06/14/2020 5:23:30 PM   Radiology DG Chest 2 View  Result Date: 06/14/2020 CLINICAL DATA:  Shortness of breath and lower extremity edema. Leg swelling for 3 months but worsened today. History of congestive heart failure. Negative COVID test. EXAM: CHEST - 2 VIEW COMPARISON:  chest x-ray 07/20/2011, 03/03/2020, 04/05/2020, 05/03/2020. FINDINGS: The heart size and mediastinal contours are unchanged. Interval development of retrocardiac airspace opacity as well as right lower lobe hazy airspace opacity. Similar-appearing increased interstitial markings with no overt pulmonary edema. Interval increase in size of bilateral trace to small volume pleural effusions. No pneumothorax. No acute osseous abnormality. IMPRESSION: 1. Interval increase in size of bilateral trace to small volume pleural effusions. 2. Right lower lobe and retrocardiac opacities could represent combination of atelectasis, infection, inflammation. Followup PA and lateral chest X-ray is recommended in 3-4 weeks following therapy to ensure resolution and exclude underlying malignancy. Electronically Signed   By: Iven Finn M.D.   On: 06/14/2020 15:56    Procedures Procedures (including critical care time)  Medications Ordered in ED Medications  furosemide (LASIX) injection 60 mg (60 mg Intravenous Given 06/14/20 1720)  cefTRIAXone (ROCEPHIN) 1 g in sodium chloride 0.9 % 100 mL IVPB (0 g Intravenous Stopped 06/14/20 1759)  azithromycin (ZITHROMAX) 500 mg in sodium chloride 0.9 % 250 mL IVPB (500 mg Intravenous New Bag/Given 06/14/20 1717)    ED Course  I have reviewed the triage vital signs and the nursing notes.  Pertinent labs & imaging results that were available during my care of the patient were reviewed by me and considered in my medical decision making (see chart for details).  Clinical Course as of Jun 14 1816  [facility] Jun 14, 2020  1607 Consistent with previous values.  Calcium(!): 8.3 [SJ]  1607 Consistent with  previous values.  BUN(!): 43 [SJ]  1801 Spoke with Dr. Clearence Ped, hospitalist. Agrees to admit the patient.   [SJ]    Clinical Course User Index [SJ] Aino Heckert, Helane Gunther, PA-C   MDM Rules/Calculators/A&P                          Patient presents with increased shortness of breath, much worse today.  Lower extremity edema on exam along with orthopnea.  SPO2 91 to 92% on room air. Increased work of breathing and tachypnea with even mild exertion.  Patient is much more comfortable on 2 L supplemental O2. Despite the above, patient is nontoxic appearing, afebrile, not hypotensive.  I personally reviewed and interpreted the patient's labs and imaging studies. Chest x-ray with right lower and retrocardiac opacities as well as bilateral small pleural effusions. Elevation in the patient's BNP consistent with level noted during his last admission for CHF exacerbation.  I suspect accommodation of CHF exacerbation and possible community-acquired pneumonia. Due to the level of the patient's shortness of breath, I do suspect he would benefit from hospital admission.   Reviewed most recent echo, LVEF 50%.  Findings and plan of care discussed with Pattricia Boss, MD.   Vitals:   06/14/20 1630 06/14/20 1700 06/14/20 1730 06/14/20 1800  BP: 135/82 (!) 145/81 131/83 (!) 123/98  Pulse: 81 84 81 81  Resp: (!) 23 (!) 28 (!) 24 (!) 22  Temp:      TempSrc:      SpO2: 94% 95% 97% 96%  Weight:      Height:         Final Clinical Impression(s) / ED Diagnoses Final diagnoses:  Community acquired pneumonia of right lower lobe of lung  Peripheral edema    Rx / DC Orders ED Discharge Orders    None       Layla Maw 06/14/20 1817    Pattricia Boss, MD 06/15/20 1236

## 2020-06-14 NOTE — H&P (Signed)
TRH H&P    Patient Demographics:    [name], is a 54 y.o. male  MRN: [id number]  DOB - 11-Oct-1965  Admit Date - 06/14/2020  Referring MD/NP/PA: Caryl Asp  Outpatient Primary MD for the patient is [name], MD  Patient coming from: Home  Chief complaint- Dyspnea   HPI:    [name]  is a 53 y.o. male, with history of hypertension, diabetes mellitus type 2, CHF-grade 2 diastolic, presents to the ER with a chief complaint of dyspnea.  Patient reports it started today.  He has had associated dizziness, fluid retention, and felt near syncopal.  He had a cardiologist appointment, but when he got to the clinic he could not breathe and felt really dizzy, and they told him that he was there on the wrong day for his appointment so they wheeled him over here to the ER per his report.  Patient reports he was here a month ago.  He reports that he was in 100% back to normal at the time of discharge, but he was fine to manage at home until today.  He reports he has been taking his diuretics every day.  Patient reports that he has had increased orthopnea, and his fluid retention is in his legs and his stomach.  Patient does not wear oxygen at home, but is requiring 2 L nasal cannula here in the ER.  Patient reports that this fluid has been building up over the last 1 to 2 weeks.  He has no associated chest pain, but he does mention occasional exertional palpitations.  Patient has a cough that is nonproductive.  He reports no fevers.  He has not had any trouble swallowing, choking, coughing when eating.  He does report a change in vision that he describes as floaters in his right eye.  It is constant.  He has not gone for his yearly diabetic eye exam with his ophthalmologist.  He reports that his sugars normally run 200s in the morning.  He reports that his sugars have to stay that high because when he goes lows 90s he feels  anxious and he has to drink orange juice to bring them back up.  Patient reports 2 to 3 days of constipation on review of systems.  He has had no abdominal pain.  No decrease in appetite.  No vomiting.  Of note at the time of medical exam patient is eating a heavy salt meal and had to go box that he said somebody brought in for him.  When I told him that he cannot eat this kind of food because it salting it causes fluid retention he reported it tastes salty.  In the ED Temp 98, heart rate 84, respiratory rate 28, blood pressure 145/81, satting at 95% No leukocytosis with a white blood cell count of 5.0, hemoglobin 9.7 Chemistry panel reveals a creatinine 2.09 which is identical to his last creatinine from discharge at his last admission His albumin is 3.4, BNP 233, initial troponin is 10, negative Covid and flu Chest x-ray shows increase  in pleural effusions and right lower lobe opacities and retrocardiac opacities that could represent a combination of atelectasis, infection, inflammation EKG shows a heart rate of 84, sinus rhythm, QTC 483      Review of systems:    In addition to the HPI above,  No Fever-chills, No Headache, No changes with Vision or hearing, No problems swallowing food or Liquids, No Chest pain, Cough admits to shortness of breath, orthopnea No Abdominal pain, No Nausea or Vomiting, admits to constipation No Blood in stool or Urine, No dysuria, No new skin rashes or bruises, No new joints pains-aches,  No new weakness, tingling, numbness in any extremity, No recent weight gain or loss, No polyuria, polydypsia or polyphagia, No significant Mental Stressors.  All other systems reviewed and are negative.    Past History of the following :    Past Medical History:  Diagnosis Date  . CHF (congestive heart failure) ([place])   . Diabetes mellitus   . Hypertension       Past Surgical History:  Procedure Laterality Date  . INCISION AND DRAINAGE    . KNEE  SURGERY        Social History:      Social History   Tobacco Use  . Smoking status: Never Smoker  . Smokeless tobacco: Never Used  Substance Use Topics  . Alcohol use: No       Family History :     Family History  Problem Relation Age of Onset  . Dementia Mother   . Diabetes Father       Home Medications:   Prior to Admission medications   Medication Sig Start Date End Date Taking? Authorizing Provider  amLODipine (NORVASC) 10 MG tablet Take 1 tablet (10 mg total) by mouth daily. 04/28/20  Yes Imogene Burn, PA-C  furosemide (LASIX) 40 MG tablet Take 1.5 tablets (60 mg total) by mouth 2 (two) times daily. 04/28/20 07/27/20 Yes Imogene Burn, PA-C  glimepiride (AMARYL) 4 MG tablet Take 1 tablet (4 mg total) by mouth daily before breakfast. Patient taking differently: Take 4 mg by mouth in the morning and at bedtime.  07/24/11 06/14/20 Yes [name], MD  hydrALAZINE (APRESOLINE) 100 MG tablet Take 1 tablet (100 mg total) by mouth 3 (three) times daily. 04/10/20  Yes Barton Dubois, MD  labetalol (NORMODYNE) 200 MG tablet Take 1 tablet (200 mg total) by mouth 2 (two) times daily. 04/28/20  Yes Imogene Burn, PA-C  metFORMIN (GLUCOPHAGE-XR) 500 MG 24 hr tablet Take 2 tablets (1,000 mg total) by mouth in the morning and at bedtime. 04/10/20  Yes Barton Dubois, MD  Multiple Vitamin (MULTIVITAMIN WITH MINERALS) TABS tablet Take 1 tablet by mouth daily.   Yes [provider]  pioglitazone (ACTOS) 30 MG tablet Take 30 mg by mouth daily. 05/11/20  Yes [provider]  pravastatin (PRAVACHOL) 40 MG tablet Take 40 mg by mouth in the morning.    Yes [provider]     Allergies:    No Known Allergies   Physical Exam:   Vitals  Blood pressure 133/81, pulse 84, temperature 98 F (36.7 C), temperature source Oral, resp. rate 11, height 5\' 11"  (1.803 m), weight 127 kg, SpO2 96 %.  1.  General: Patient sitting up in bed eating salty  dinner  2. Psychiatric: Mood and behavior normal for situation, cooperative with exam, alert and oriented x3  3. Neurologic: Cranial nerves II through XII are grossly intact, moves all 4  extremities voluntarily, speech and language normal, no focal deficit on limited exam  4. HEENMT:  Head is atraumatic, normocephalic, pupils are reactive to light, neck is supple, trachea is midline  5. Respiratory : Minimal wheezing in bilateral lung fields, no crackles, no rhonchi, no cyanosis  6. Cardiovascular : Heart rate is normal, rhythm is regular, no murmurs rubs or gallops  7. Gastrointestinal:  Abdomen is soft, mildly distended, no palpable masses, bowel sounds active, nontender  8. Skin:  No acute lesions on limited skin exam  9.Musculoskeletal:  3+ pitting edema in the lower extremities bilaterally    Data Review:    CBC Recent Labs  Lab 06/14/20 1520  WBC 5.0  HGB 9.7*  HCT 30.2*  PLT 272  MCV 82.7  MCH 26.6  MCHC 32.1  RDW 14.9  LYMPHSABS 1.1  MONOABS 0.5  EOSABS 0.1  BASOSABS 0.0   ------------------------------------------------------------------------------------------------------------------  Results for orders placed or performed during the hospital encounter of 06/14/20 (from the past 48 hour(s))  Resp Panel by RT-PCR (Flu A&B, Covid) Nasopharyngeal Swab     Status: None   Collection Time: 06/14/20  2:29 PM   Specimen: Nasopharyngeal Swab; Nasopharyngeal(NP) swabs in vial transport medium  Result Value Ref Range   SARS Coronavirus 2 by RT PCR NEGATIVE NEGATIVE    Comment: (NOTE) SARS-CoV-2 target nucleic acids are NOT DETECTED.  The SARS-CoV-2 RNA is generally detectable in upper respiratory specimens during the acute phase of infection. The lowest concentration of SARS-CoV-2 viral copies this assay can detect is 138 copies/mL. A negative result does not preclude SARS-Cov-2 infection and should not be used as the sole basis for treatment or other  patient management decisions. A negative result may occur with  improper specimen collection/handling, submission of specimen other than nasopharyngeal swab, presence of viral mutation(s) within the areas targeted by this assay, and inadequate number of viral copies(<138 copies/mL). A negative result must be combined with clinical observations, patient history, and epidemiological information. The expected result is Negative.  Fact Sheet for Patients:  EntrepreneurPulse.com.au  Fact Sheet for Healthcare Providers:  IncredibleEmployment.be  This test is no t yet approved or cleared by the [place] FDA and  has been authorized for detection and/or diagnosis of SARS-CoV-2 by FDA under an Emergency Use Authorization (EUA). This EUA will remain  in effect (meaning this test can be used) for the duration of the COVID-19 declaration under Section 564(b)(1) of the Act, 21 U.S.C.section 360bbb-3(b)(1), unless the authorization is terminated  or revoked sooner.       Influenza A by PCR NEGATIVE NEGATIVE   Influenza B by PCR NEGATIVE NEGATIVE    Comment: (NOTE) The Xpert Xpress SARS-CoV-2/FLU/RSV plus assay is intended as an aid in the diagnosis of influenza from Nasopharyngeal swab specimens and should not be used as a sole basis for treatment. Nasal washings and aspirates are unacceptable for Xpert Xpress SARS-CoV-2/FLU/RSV testing.  Fact Sheet for Patients: EntrepreneurPulse.com.au  Fact Sheet for Healthcare Providers: IncredibleEmployment.be  This test is not yet approved or cleared by the [place] FDA and has been authorized for detection and/or diagnosis of SARS-CoV-2 by FDA under an Emergency Use Authorization (EUA). This EUA will remain in effect (meaning this test can be used) for the duration of the COVID-19 declaration under Section 564(b)(1) of the Act, 21 U.S.C. section 360bbb-3(b)(1), unless  the authorization is terminated or revoked.  Performed at [facility], [address] [place], [place] [address]   Comprehensive metabolic panel  Status: Abnormal   Collection Time: 06/14/20  3:20 PM  Result Value Ref Range   Sodium 140 135 - 145 mmol/L   Potassium 4.6 3.5 - 5.1 mmol/L   Chloride 107 98 - 111 mmol/L   CO2 25 22 - 32 mmol/L   Glucose, Bld 116 (H) 70 - 99 mg/dL    Comment: Glucose reference range applies only to samples taken after fasting for at least 8 hours.   BUN 43 (H) 6 - 20 mg/dL   Creatinine, Ser 2.09 (H) 0.61 - 1.24 mg/dL   Calcium 8.3 (L) 8.9 - 10.3 mg/dL   Total Protein 6.4 (L) 6.5 - 8.1 g/dL   Albumin 3.4 (L) 3.5 - 5.0 g/dL   AST 20 15 - 41 U/L   ALT 29 0 - 44 U/L   Alkaline Phosphatase 110 38 - 126 U/L   Total Bilirubin 0.4 0.3 - 1.2 mg/dL   GFR, Estimated 37 (L) >60 mL/min    Comment: (NOTE) Calculated using the CKD-EPI Creatinine Equation (2021)    Anion gap 8 5 - 15    Comment: Performed at [facility], [address], [place], [place] [address]  Brain natriuretic peptide     Status: Abnormal   Collection Time: 06/14/20  3:20 PM  Result Value Ref Range   B Natriuretic Peptide 266.0 (H) 0.0 - 100.0 pg/mL    Comment: Performed at [facility], [address] [place], [place] [address]  Troponin I (High Sensitivity)     Status: None   Collection Time: 06/14/20  3:20 PM  Result Value Ref Range   Troponin I (High Sensitivity) 10 <18 ng/L    Comment: (NOTE) Elevated high sensitivity troponin I (hsTnI) values and significant  changes across serial measurements may suggest ACS but many other  chronic and acute conditions are known to elevate hsTnI results.  Refer to the "Links" section for chest pain algorithms and additional  guidance. Performed at [facility], [address], [place], [place] [address]   CBC with Differential     Status: Abnormal   Collection Time: 06/14/20  3:20 PM  Result Value Ref Range   WBC 5.0 4.0 - 10.5 K/uL   RBC  3.65 (L) 4.22 - 5.81 MIL/uL   Hemoglobin 9.7 (L) 13.0 - 17.0 g/dL   HCT 30.2 (L) 39 - 52 %   MCV 82.7 80.0 - 100.0 fL   MCH 26.6 26.0 - 34.0 pg   MCHC 32.1 30.0 - 36.0 g/dL   RDW 14.9 11.5 - 15.5 %   Platelets 272 150 - 400 K/uL   nRBC 0.0 0.0 - 0.2 %   Neutrophils Relative % 64 %   Neutro Abs 3.2 1.7 - 7.7 K/uL   Lymphocytes Relative 22 %   Lymphs Abs 1.1 0.7 - 4.0 K/uL   Monocytes Relative 11 %   Monocytes Absolute 0.5 0.1 - 1.0 K/uL   Eosinophils Relative 2 %   Eosinophils Absolute 0.1 0.0 - 0.5 K/uL   Basophils Relative 1 %   Basophils Absolute 0.0 0.0 - 0.1 K/uL   Immature Granulocytes 0 %   Abs Immature Granulocytes 0.01 0.00 - 0.07 K/uL    Comment: Performed at [facility], [address], [place], [place] [address]  Troponin I (High Sensitivity)     Status: None   Collection Time: 06/14/20  5:13 PM  Result Value Ref Range   Troponin I (High Sensitivity) 9 <18 ng/L    Comment: (NOTE) Elevated high sensitivity  troponin I (hsTnI) values and significant  changes across serial measurements may suggest ACS but many other  chronic and acute conditions are known to elevate hsTnI results.  Refer to the "Links" section for chest pain algorithms and additional  guidance. Performed at [facility], [address], [place], [place] [address]     Chemistries  Recent Labs  Lab 06/14/20 1520  NA 140  K 4.6  CL 107  CO2 25  GLUCOSE 116*  BUN 43*  CREATININE 2.09*  CALCIUM 8.3*  AST 20  ALT 29  ALKPHOS 110  BILITOT 0.4   ------------------------------------------------------------------------------------------------------------------  ------------------------------------------------------------------------------------------------------------------ GFR: Estimated Creatinine Clearance: 54.2 mL/min (A) (by C-G formula based on SCr of 2.09 mg/dL (H)). Liver Function Tests: Recent Labs  Lab 06/14/20 1520  AST 20  ALT 29  ALKPHOS 110  BILITOT 0.4  PROT 6.4*  ALBUMIN  3.4*   No results for input(s): LIPASE, AMYLASE in the last 168 hours. No results for input(s): AMMONIA in the last 168 hours. Coagulation Profile: No results for input(s): INR, PROTIME in the last 168 hours. Cardiac Enzymes: No results for input(s): CKTOTAL, CKMB, CKMBINDEX, TROPONINI in the last 168 hours. BNP (last 3 results) No results for input(s): PROBNP in the last 8760 hours. HbA1C: No results for input(s): HGBA1C in the last 72 hours. CBG: No results for input(s): GLUCAP in the last 168 hours. Lipid Profile: No results for input(s): CHOL, HDL, LDLCALC, TRIG, CHOLHDL, LDLDIRECT in the last 72 hours. Thyroid Function Tests: No results for input(s): TSH, T4TOTAL, FREET4, T3FREE, THYROIDAB in the last 72 hours. Anemia Panel: No results for input(s): VITAMINB12, FOLATE, FERRITIN, TIBC, IRON, RETICCTPCT in the last 72 hours.  --------------------------------------------------------------------------------------------------------------- Urine analysis: No results found for: COLORURINE, APPEARANCEUR, LABSPEC, PHURINE, GLUCOSEU, HGBUR, BILIRUBINUR, KETONESUR, PROTEINUR, UROBILINOGEN, NITRITE, LEUKOCYTESUR    Imaging Results:    DG Chest 2 View  Result Date: 06/14/2020 CLINICAL DATA:  Shortness of breath and lower extremity edema. Leg swelling for 3 months but worsened today. History of congestive heart failure. Negative COVID test. EXAM: CHEST - 2 VIEW COMPARISON:  chest x-ray 07/20/2011, 03/03/2020, 04/05/2020, 05/03/2020. FINDINGS: The heart size and mediastinal contours are unchanged. Interval development of retrocardiac airspace opacity as well as right lower lobe hazy airspace opacity. Similar-appearing increased interstitial markings with no overt pulmonary edema. Interval increase in size of bilateral trace to small volume pleural effusions. No pneumothorax. No acute osseous abnormality. IMPRESSION: 1. Interval increase in size of bilateral trace to small volume pleural  effusions. 2. Right lower lobe and retrocardiac opacities could represent combination of atelectasis, infection, inflammation. Followup PA and lateral chest X-ray is recommended in 3-4 weeks following therapy to ensure resolution and exclude underlying malignancy. Electronically Signed   By: Iven Finn M.D.   On: 06/14/2020 15:56    My personal review of EKG: Rhythm NSR, Rate 84 /min, QTc 483 ,no Acute ST changes   Assessment & Plan:    Active Problems:   CHF exacerbation ([place])   1. Acute hypoxic respiratory failure 1. Reports O2 saturations dropped at cardiology clinic 2. Normalized here on 2 L nasal cannula 3. Secondary to #2 #3 4. Wean off O2 as tolerated 5. Monitor on telemetry 2. CHF exacerbation 1. Cardiology clinic reports that dry weight is 275 pounds 2. Weight in the ER is 279 pounds 3. Patient has increased dyspnea, orthopnea, peripheral edema 4. BNP elevated at 233 5. Chest x-ray showing pleural effusions 6. IV Lasix 60 mg daily, monitor I's and O's,  no echo as recent echocardiogram done 04/06/2020 shows grade 2 diastolic dysfunction 7. Heart healthy carb modified diet with fluid restriction 8. Continue to monitor 3. Community-acquired pneumonia 1. Zithromax and Rocephin 2. Sputum culture pending 3. Repeat chest x-ray in the a.m. 4. Mild Protein calorie malnutrition 1. Albumin 3.4 2. Extensive counseling on diet 3. Continue to encourage patient to take nutrient dense food choices 5. Diabetes mellitus type 2 1. Hold oral hypoglycemics 2. Sliding scale coverage 3. Continue to monitor 6.    DVT Prophylaxis-Heparin- SCDs   AM Labs Ordered, also please review Full Orders  Family Communication: No family at bedside Code Status: Full  Admission status: Inpatient :The appropriate admission status for this patient is INPATIENT. Inpatient status is judged to be reasonable and necessary in order to provide the required intensity of service to ensure the patient's  safety. The patient's presenting symptoms, physical exam findings, and initial radiographic and laboratory data in the context of their chronic comorbidities is felt to place them at high risk for further clinical deterioration. Furthermore, it is not anticipated that the patient will be medically stable for discharge from the hospital within 2 midnights of admission. The following factors support the admission status of inpatient.     The patient's presenting symptoms include dyspnea, dizziness, orthopnea, peripheral edema The worrisome physical exam findings include 3+ pitting edema. The initial radiographic and laboratory data are worrisome because of pleural effusions, elevated BNP, chest x-ray showing pneumonia. The chronic co-morbidities include diabetes mellitus, hypertension, CHF.       * I certify that at the point of admission it is my clinical judgment that the patient will require inpatient hospital care spanning beyond 2 midnights from the point of admission due to high intensity of service, high risk for further deterioration and high frequency of surveillance required.*  Time spent in minutes : [place]

## 2020-06-14 NOTE — Progress Notes (Deleted)
Cardiology Office Note    Date:  06/14/2020   ID:  [name], DOB [birth year], MRN [id number]  PCP:  [name], MD  Cardiologist: Carlyle Dolly, MD EPS: None  No chief complaint on file.   History of Present Illness:  [name] is a 54 y.o. male with history of hypertension HLD and type II DM.  Was admitted 03/2020 with acute CHF and diuresed over 6 L.  Also had resistant hypertension.  Plan for outpatient renal ultrasound and sleep study.  Echo 04/06/2020 EF 50% with severe LVH and grade 2 DD.  I saw the patient in [facility] 04/28/2020 and weight was up to 280 pounds from discharge weight of 269 pounds with increased swelling, 2 pillow orthopnea and dyspnea on exertion.  Blood pressure was high and he had not gotten labetalol or amlodipine filled from the hospital.  I saw him back in the office 05/03/2020 and he was very dizzy with blurred vision.  Blood sugar was 76 but had been running in the 300s.  He never got his Norvasc or labetalol filled because [place] told him he already picked it up.  Blood pressure was 176/94.  I sent him to the emergency room.  Patient diuresed 8.4 L with IV Lasix discharge weight 268 pounds.  He was discharged on amlodipine 10 mg daily hydralazine 100 mg 3 times daily and labetalol 200 mg twice daily.  Peak creatinine 2.2, 2.09 at discharge.  Patient called in 06/09/2020 with increased shortness of breath and swelling.  Patient was advised to go to the emergency room and if not increase his Lasix to 80 mg twice daily.   Past Medical History:  Diagnosis Date  . CHF (congestive heart failure) ([place])   . Diabetes mellitus   . Hypertension     Past Surgical History:  Procedure Laterality Date  . INCISION AND DRAINAGE    . KNEE SURGERY      Current Medications: No outpatient medications have been marked as taking for the 06/15/20 encounter (Appointment) with Imogene Burn, PA-C.     Allergies:   Patient has no known  allergies.   Social History   Socioeconomic History  . Marital status: Married    Spouse name: Not on file  . Number of children: Not on file  . Years of education: Not on file  . Highest education level: Not on file  Occupational History  . Not on file  Tobacco Use  . Smoking status: Never Smoker  . Smokeless tobacco: Never Used  Vaping Use  . Vaping Use: Never used  Substance and Sexual Activity  . Alcohol use: No  . Drug use: No  . Sexual activity: Not on file  Other Topics Concern  . Not on file  Social History Narrative  . Not on file   Social Determinants of Health   Financial Resource Strain:   . Difficulty of Paying Living Expenses: Not on file  Food Insecurity:   . Worried About Charity fundraiser in the Last Year: Not on file  . Ran Out of Food in the Last Year: Not on file  Transportation Needs:   . Lack of Transportation (Medical): Not on file  . Lack of Transportation (Non-Medical): Not on file  Physical Activity:   . Days of Exercise per Week: Not on file  . Minutes of Exercise per Session: Not on file  Stress:   . Feeling of Stress : Not on file  Social Connections:   .  Frequency of Communication with Friends and Family: Not on file  . Frequency of Social Gatherings with Friends and Family: Not on file  . Attends Religious Services: Not on file  . Active Member of Clubs or Organizations: Not on file  . Attends Archivist Meetings: Not on file  . Marital Status: Not on file     Family History:  The patient's ***family history includes Dementia in his mother; Diabetes in his father.   ROS:   Please see the history of present illness.    ROS All other systems reviewed and are negative.   PHYSICAL EXAM:   VS:  There were no vitals taken for this visit.  Physical Exam  GEN: Well nourished, well developed, in no acute distress  HEENT: normal  Neck: no JVD, carotid bruits, or masses Cardiac:RRR; no murmurs, rubs, or gallops   Respiratory:  clear to auscultation bilaterally, normal work of breathing GI: soft, nontender, nondistended, + BS Ext: without cyanosis, clubbing, or edema, Good distal pulses bilaterally MS: no deformity or atrophy  Skin: warm and dry, no rash Neuro:  Alert and Oriented x 3, Strength and sensation are intact Psych: euthymic mood, full affect  Wt Readings from Last 3 Encounters:  05/07/20 268 lb 3.2 oz (121.7 kg)  05/03/20 279 lb (126.6 kg)  04/28/20 280 lb 12.8 oz (127.4 kg)      Studies/Labs Reviewed:   EKG:  EKG is*** ordered today.  The ekg ordered today demonstrates ***  Recent Labs: 04/05/2020: ALT 35 04/08/2020: Magnesium 1.7 04/10/2020: TSH 2.318 05/03/2020: B Natriuretic Peptide 229.0; Hemoglobin 11.0; Platelets 257 05/07/2020: BUN 42; Creatinine, Ser 2.09; Potassium 3.9; Sodium 137   Lipid Panel    Component Value Date/Time   CHOL 161 04/07/2020 0602   TRIG 80 04/07/2020 0602   HDL 58 04/07/2020 0602   CHOLHDL 2.8 04/07/2020 0602   VLDL 16 04/07/2020 0602   LDLCALC 87 04/07/2020 0602    Additional studies/ records that were reviewed today include:  04/06/2020   IMPRESSIONS     1. Left ventricular ejection fraction, by estimation, is 50%. The left  ventricle has normal function. The left ventricle has no regional wall  motion abnormalities. There is severe left ventricular hypertrophy. Left  ventricular diastolic parameters are  consistent with Grade II diastolic dysfunction (pseudonormalization).  Elevated left atrial pressure.   2. Right ventricular systolic function is normal. The right ventricular  size is normal.   3. Left atrial size was mildly dilated.   4. Large pleural effusion in the left lateral region.   5. The mitral valve is normal in structure. Trivial mitral valve  regurgitation. No evidence of mitral stenosis.   6. The aortic valve is tricuspid. Aortic valve regurgitation is not  visualized. No aortic stenosis is present.   7. The  inferior vena cava is normal in size with greater than 50%  respiratory variability, suggesting right atrial pressure of 3 mmHg.   FINDINGS   Left Ventricle: Left ventricular ejection fraction, by estimation, is  50%. The left ventricle has normal function. The left ventricle has no  regional wall motion abnormalities. The left ventricular internal cavity  size was normal in size. There is  severe left ventricular hypertrophy. Left ventricular diastolic parameters  are consistent with Grade II diastolic dysfunction (pseudonormalization).  Elevated left atrial pressure.   Right Ventricle: The right ventricular size is normal. No increase in  right ventricular wall thickness. Right ventricular systolic function is  normal.  Left Atrium: Left atrial size was mildly dilated.   Right Atrium: Right atrial size was normal in size.   Pericardium: There is no evidence of pericardial effusion.   Mitral Valve: The mitral valve is normal in structure. Trivial mitral  valve regurgitation. No evidence of mitral valve stenosis.   Tricuspid Valve: The tricuspid valve is normal in structure. Tricuspid  valve regurgitation is not demonstrated. No evidence of tricuspid  stenosis.   Aortic Valve: The aortic valve is tricuspid. Aortic valve regurgitation is  not visualized. No aortic stenosis is present. Aortic valve mean gradient  measures 2.0 mmHg. Aortic valve peak gradient measures 3.9 mmHg. Aortic  valve area, by VTI measures 2.73  cm.   Pulmonic Valve: The pulmonic valve was not well visualized. Pulmonic valve  regurgitation is not visualized. No evidence of pulmonic stenosis.   Aorta: The aortic root is normal in size and structure.   Pulmonary Artery: Indeterminant PASP, inadequate TR jet.   Venous: The inferior vena cava is normal in size with greater than 50%  respiratory variability, suggesting right atrial pressure of 3 mmHg.   IAS/Shunts: No atrial level shunt detected by color  flow Doppler.   Additional Comments: There is a large pleural effusion in the left lateral  region.          ASSESSMENT:    No diagnosis found.   PLAN:  In order of problems listed above:  Diastolic CHF EF 58% with grade 2 DD and severe LVH.  Multiple hospitalizations for fluid overload and noncompliance with medications and resistant hypertension.  Discharged 05/07/2020 weight of 268 pounds and -8.4 L.  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  May be playing a role in resistant hypertension  Medication Adjustments/Labs and Tests Ordered: Current medicines are reviewed at length with the patient today.  Concerns regarding medicines are outlined above.  Medication changes, Labs and Tests ordered today are listed in the Patient Instructions below. There are no Patient Instructions on file for this visit.   Sumner Boast, PA-C  06/14/2020 11:52 AM    [facility] [address], [place],   [address] Phone: [phone]; Fax: [phone]

## 2020-06-14 NOTE — ED Triage Notes (Signed)
Pt c/o SOB and lower leg swelling x 3 months, worsening today. Pt has hx of CHF.

## 2020-06-15 ENCOUNTER — Ambulatory Visit: Payer: Commercial Managed Care - PPO | Admitting: Physician Assistant

## 2020-06-15 ENCOUNTER — Inpatient Hospital Stay (HOSPITAL_COMMUNITY): Payer: Commercial Managed Care - PPO

## 2020-06-15 DIAGNOSIS — E669 Obesity, unspecified: Secondary | ICD-10-CM

## 2020-06-15 DIAGNOSIS — G4733 Obstructive sleep apnea (adult) (pediatric): Secondary | ICD-10-CM

## 2020-06-15 DIAGNOSIS — E1121 Type 2 diabetes mellitus with diabetic nephropathy: Secondary | ICD-10-CM

## 2020-06-15 DIAGNOSIS — E785 Hyperlipidemia, unspecified: Secondary | ICD-10-CM

## 2020-06-15 DIAGNOSIS — I5033 Acute on chronic diastolic (congestive) heart failure: Secondary | ICD-10-CM | POA: Diagnosis not present

## 2020-06-15 DIAGNOSIS — I10 Essential (primary) hypertension: Secondary | ICD-10-CM

## 2020-06-15 DIAGNOSIS — E46 Unspecified protein-calorie malnutrition: Secondary | ICD-10-CM

## 2020-06-15 DIAGNOSIS — N1831 Chronic kidney disease, stage 3a: Secondary | ICD-10-CM

## 2020-06-15 LAB — COMPREHENSIVE METABOLIC PANEL
ALT: 24 U/L (ref 0–44)
AST: 14 U/L — ABNORMAL LOW (ref 15–41)
Albumin: 3.2 g/dL — ABNORMAL LOW (ref 3.5–5.0)
Alkaline Phosphatase: 104 U/L (ref 38–126)
Anion gap: 8 (ref 5–15)
BUN: 43 mg/dL — ABNORMAL HIGH (ref 6–20)
CO2: 27 mmol/L (ref 22–32)
Calcium: 8.4 mg/dL — ABNORMAL LOW (ref 8.9–10.3)
Chloride: 107 mmol/L (ref 98–111)
Creatinine, Ser: 2.1 mg/dL — ABNORMAL HIGH (ref 0.61–1.24)
GFR, Estimated: 36 mL/min — ABNORMAL LOW (ref >60)
Glucose, Bld: 81 mg/dL (ref 70–99)
Potassium: 4.3 mmol/L (ref 3.5–5.1)
Sodium: 142 mmol/L (ref 135–145)
Total Bilirubin: 0.5 mg/dL (ref 0.3–1.2)
Total Protein: 6.2 g/dL — ABNORMAL LOW (ref 6.5–8.1)

## 2020-06-15 LAB — GLUCOSE, CAPILLARY
Glucose-Capillary: 68 mg/dL — ABNORMAL LOW (ref 70–99)
Glucose-Capillary: 81 mg/dL (ref 70–99)

## 2020-06-15 LAB — MAGNESIUM: Magnesium: 2 mg/dL (ref 1.7–2.4)

## 2020-06-15 LAB — TSH: TSH: 2.093 u[IU]/mL (ref 0.350–4.500)

## 2020-06-15 LAB — HIV ANTIBODY (ROUTINE TESTING W REFLEX): HIV Screen 4th Generation wRfx: NONREACTIVE

## 2020-06-15 MED ORDER — FUROSEMIDE 10 MG/ML IJ SOLN
60.0000 mg | Freq: Two times a day (BID) | INTRAMUSCULAR | Status: DC
  Administered 2020-06-15 – 2020-06-20 (×10): 60 mg via INTRAVENOUS
  Filled 2020-06-15 (×10): qty 6

## 2020-06-15 MED ORDER — FUROSEMIDE 10 MG/ML IJ SOLN: 40.0000 mg | Freq: Two times a day (BID) | INTRAMUSCULAR | Status: DC

## 2020-06-15 NOTE — Progress Notes (Addendum)
PROGRESS NOTE   [name]  ZOX:[id number] DOB: Jul 20, 1966 DOA: 06/14/2020 PCP: [name], MD   Chief Complaint  Patient presents with  . Shortness of Breath    Brief Admission History:  54 y.o. male, with history of hypertension, diabetes mellitus type 2, CHF-grade 2 diastolic, presents to the ER with a chief complaint of dyspnea.  Patient reports it started today.  He has had associated dizziness, fluid retention, and felt near syncopal.  He had a cardiologist appointment, but when he got to the clinic he could not breathe and felt really dizzy, and they told him that he was there on the wrong day for his appointment so they wheeled him over here to the ER per his report.  Patient reports he was here a month ago.  He reports that he was in 100% back to normal at the time of discharge, but he was fine to manage at home until today.  He reports he has been taking his diuretics every day.  Patient reports that he has had increased orthopnea, and his fluid retention is in his legs and his stomach.  Patient does not wear oxygen at home, but is requiring 2 L nasal cannula here in the ER.  Patient reports that this fluid has been building up over the last 1 to 2 weeks.  He has no associated chest pain, but he does mention occasional exertional palpitations.  Patient has a cough that is nonproductive.  He was admitted with marked volume overload and acute symptomatic diastolic heart failure.   Assessment & Plan:   Principal Problem:   CHF exacerbation ([place]) Active Problems:   Essential hypertension   Type 2 diabetes with nephropathy (HCC)   Class 2 obesity   CKD (chronic kidney disease) stage 3, GFR 30-59 ml/min (HCC)   Protein calorie malnutrition ([place])  1. Acute diastolic heart failure - pt presents with marked volume overload and weight gain and classic symptomatic heart failure.  He has failed outpatient management with current treatment regimen and will require IV lasix diuresis.  He reported dry weight is 275 pounds.  He will have daily weight measurements, strict I/O measurements and serial BNP testing.  I have changed lasix to 60 mg IV every 12 hours.  If this is not effective would consider IV lasix infusion.  He will need dietary counseling.   2. Community acquired pneumonia - continue ceftriaxone and azithromycin and supportive measures as ordered.  3. Type 2 diabetes mellitus with neurological complications - I would not restart him on pioglitozone (actos) at discharge given his congestive heart failure.  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 Follow up A1c to assess glycemic control.  For now, continue SSI coverage and CBG monitoring.   4. Stage 3a CKD - continue to monitor creatinine closely in the setting of IV diuresis.   5. Essential hypertension - resuming home meds, follow closely as it has been difficult to control in the past.   DVT prophylaxis: SQ heparin Code Status: full  Family Communication: t/c to [facility] 11/23   Disposition: TBD  Status is: Inpatient  Remains inpatient appropriate because:Inpatient level of care appropriate due to severity of illness  Dispo: The patient is from: Home              Anticipated d/c is to: Home              Anticipated d/c date is: 3 days  Patient currently is not medically stable to d/c.  Consultants:     Procedures:     Antimicrobials:   Subjective: Pt reports that he has urinated at least 3 times this morning.  He is still very SOB and having to sit up in the recliner and not able to lie recumbent.  His feet and legs remain very swollen.    Objective: Vitals:   06/15/20 0801 06/15/20 0830 06/15/20 0832 06/15/20 1500  BP: (!) 150/84   (!) 143/80  Pulse: 86   81  Resp: 18   20  Temp: 98.6 F (37 C)   98 F (36.7 C)  TempSrc: Oral   Oral  SpO2: 92% (!) 83% 93% 98%  Weight:      Height:        Intake/Output Summary (Last 24 hours) at 06/15/2020 1712 Last data filed at  06/15/2020 1600 Gross per 24 hour  Intake 1165.18 ml  Output --  Net 1165.18 ml   Filed Weights   06/14/20 1403 06/14/20 2049  Weight: 127 kg 132 kg   Examination:  General exam: Pt appears morbidly obese with volume overloaded.  He is sitting up in recliner speaking in short sentences.  Appears calm.  Cooperative.   Respiratory system: difficult to hear but crackles heard at bases. Respiratory effort normal. Cardiovascular system: normal S1 & S2 heard. 1+ JVD, 2++ pedal edema. Gastrointestinal system: Abdomen is obese, nondistended, soft and nontender. No organomegaly or masses felt. Normal bowel sounds heard. Central nervous system: Alert and oriented. No focal neurological deficits. Extremities: 2++ edema BLEs (pitting).  Symmetric 5 x 5 power. Skin: No rashes, lesions or ulcers Psychiatry: Judgement and insight appear normal. Mood & affect appropriate.   Data Reviewed: I have personally reviewed following labs and imaging studies  CBC: Recent Labs  Lab 06/14/20 1520  WBC 5.0  NEUTROABS 3.2  HGB 9.7*  HCT 30.2*  MCV 82.7  PLT 614    Basic Metabolic Panel: Recent Labs  Lab 06/14/20 1520 06/15/20 0552  NA 140 142  K 4.6 4.3  CL 107 107  CO2 25 27  GLUCOSE 116* 81  BUN 43* 43*  CREATININE 2.09* 2.10*  CALCIUM 8.3* 8.4*  MG  --  2.0    GFR: Estimated Creatinine Clearance: 55.1 mL/min (A) (by C-G formula based on SCr of 2.1 mg/dL (H)).  Liver Function Tests: Recent Labs  Lab 06/14/20 1520 06/15/20 0552  AST 20 14*  ALT 29 24  ALKPHOS 110 104  BILITOT 0.4 0.5  PROT 6.4* 6.2*  ALBUMIN 3.4* 3.2*    CBG: Recent Labs  Lab 06/15/20 0729  GLUCAP 68*    Recent Results (from the past 240 hour(s))  Resp Panel by RT-PCR (Flu A&B, Covid) Nasopharyngeal Swab     Status: None   Collection Time: 06/14/20  2:29 PM   Specimen: Nasopharyngeal Swab; Nasopharyngeal(NP) swabs in vial transport medium  Result Value Ref Range Status   SARS Coronavirus 2 by RT PCR  NEGATIVE NEGATIVE Final    Comment: (NOTE) SARS-CoV-2 target nucleic acids are NOT DETECTED.  The SARS-CoV-2 RNA is generally detectable in upper respiratory specimens during the acute phase of infection. The lowest concentration of SARS-CoV-2 viral copies this assay can detect is 138 copies/mL. A negative result does not preclude SARS-Cov-2 infection and should not be used as the sole basis for treatment or other patient management decisions. A negative result may occur with  improper specimen collection/handling, submission of specimen other than  nasopharyngeal swab, presence of viral mutation(s) within the areas targeted by this assay, and inadequate number of viral copies(<138 copies/mL). A negative result must be combined with clinical observations, patient history, and epidemiological information. The expected result is Negative.  Fact Sheet for Patients:  EntrepreneurPulse.com.au  Fact Sheet for Healthcare Providers:  IncredibleEmployment.be  This test is no t yet approved or cleared by the [place] FDA and  has been authorized for detection and/or diagnosis of SARS-CoV-2 by FDA under an Emergency Use Authorization (EUA). This EUA will remain  in effect (meaning this test can be used) for the duration of the COVID-19 declaration under Section 564(b)(1) of the Act, 21 U.S.C.section 360bbb-3(b)(1), unless the authorization is terminated  or revoked sooner.       Influenza A by PCR NEGATIVE NEGATIVE Final   Influenza B by PCR NEGATIVE NEGATIVE Final    Comment: (NOTE) The Xpert Xpress SARS-CoV-2/FLU/RSV plus assay is intended as an aid in the diagnosis of influenza from Nasopharyngeal swab specimens and should not be used as a sole basis for treatment. Nasal washings and aspirates are unacceptable for Xpert Xpress SARS-CoV-2/FLU/RSV testing.  Fact Sheet for Patients: EntrepreneurPulse.com.au  Fact Sheet for  Healthcare Providers: IncredibleEmployment.be  This test is not yet approved or cleared by the [place] FDA and has been authorized for detection and/or diagnosis of SARS-CoV-2 by FDA under an Emergency Use Authorization (EUA). This EUA will remain in effect (meaning this test can be used) for the duration of the COVID-19 declaration under Section 564(b)(1) of the Act, 21 U.S.C. section 360bbb-3(b)(1), unless the authorization is terminated or revoked.  Performed at [facility], [address] [place], [place] [address]      Radiology Studies: DG Chest 2 View  Result Date: 06/14/2020 CLINICAL DATA:  Shortness of breath and lower extremity edema. Leg swelling for 3 months but worsened today. History of congestive heart failure. Negative COVID test. EXAM: CHEST - 2 VIEW COMPARISON:  chest x-ray 07/20/2011, 03/03/2020, 04/05/2020, 05/03/2020. FINDINGS: The heart size and mediastinal contours are unchanged. Interval development of retrocardiac airspace opacity as well as right lower lobe hazy airspace opacity. Similar-appearing increased interstitial markings with no overt pulmonary edema. Interval increase in size of bilateral trace to small volume pleural effusions. No pneumothorax. No acute osseous abnormality. IMPRESSION: 1. Interval increase in size of bilateral trace to small volume pleural effusions. 2. Right lower lobe and retrocardiac opacities could represent combination of atelectasis, infection, inflammation. Followup PA and lateral chest X-ray is recommended in 3-4 weeks following therapy to ensure resolution and exclude underlying malignancy. Electronically Signed   By: Iven Finn M.D.   On: 06/14/2020 15:56   DG Chest Port 1 View  Result Date: 06/15/2020 CLINICAL DATA:  CHF exacerbation. EXAM: PORTABLE CHEST 1 VIEW COMPARISON:  06/14/2020. FINDINGS: Cardiomegaly with pulmonary venous congestion. Bilateral pulmonary infiltrates/edema, right side greater  than left. Bilateral pleural effusions, right side greater than left. These findings have progressed slightly from prior exam. Low lung volumes. No pneumothorax. IMPRESSION: Cardiomegaly with pulmonary venous congestion. Bilateral pulmonary infiltrates/edema, right side greater than left. Bilateral pleural effusions, right side greater than left. These findings have progressed slightly from prior exam. Electronically Signed   By: [place]   On: 06/15/2020 06:05   Scheduled Meds: . amLODipine  10 mg Oral Daily  . furosemide  40 mg Intravenous Q12H  . heparin  5,000 Units Subcutaneous Q8H  . hydrALAZINE  100 mg Oral TID  . insulin aspart  0-15  Units Subcutaneous TID WC  . insulin aspart  0-5 Units Subcutaneous QHS  . labetalol  200 mg Oral BID  . multivitamin with minerals  1 tablet Oral Daily  . pravastatin  40 mg Oral Daily   Continuous Infusions: . azithromycin 500 mg (06/15/20 1557)  . cefTRIAXone (ROCEPHIN)  IV Stopped (06/15/20 1545)     LOS: 1 day   Time spent: 40 mins  Ginger Leeth Wynetta Emery, MD How to contact the [facility] Attending or Consulting provider [place] or covering provider during after hours [place], for this patient?  1. Check the care team in [facility] and look for a) attending/consulting [facility] provider listed and b) the [facility] team listed 2. Log into [email or web address] and use [place]'s universal password to access. If you do not have the password, please contact the hospital operator. 3. Locate the [facility] provider you are looking for under [facility] and page to a number that you can be directly reached. 4. If you still have difficulty reaching the provider, please page the [facility] (Director on Call) for the Hospitalists listed on amion for assistance.  06/15/2020, 5:12 PM

## 2020-06-15 NOTE — TOC Initial Note (Signed)
Transition of Care [facility]) - Initial/Assessment Note    Patient Details  Name: [name] MRN: [id number] Date of Birth: Mar 25, 1966  Transition of [facility]) CM/SW Contact:    Ihor Gully, LCSW Phone Number: 06/15/2020, 3:50 PM  Clinical Narrative:                 TOC consulted for heart health home screen. Patient from home with spouse, independent at baseline, drives. Follows heart healthy diet. Knows to contact doctor if he has an increase of 2lbs weight. Patient doe not take daily weights. He takes weights every other day however he is aware that he should take daily weights.   TOC will follow and address needs as they arise.   Expected Discharge Plan: Home/Self Care Barriers to Discharge: Continued Medical Work up   Patient Goals and CMS Choice Patient states their goals for this hospitalization and ongoing recovery are:: return home      Expected Discharge Plan and Services Expected Discharge Plan: Home/Self Care       Living arrangements for the past 2 months: Single Family Home                                      Prior Living Arrangements/Services Living arrangements for the past 2 months: Single Family Home Lives with:: Spouse Patient language and need for interpreter reviewed:: Yes Do you feel safe going back to the place where you live?: Yes      Need for Family Participation in Patient Care: Yes (Comment) Care giver support system in place?: Yes (comment)   Criminal Activity/Legal Involvement Pertinent to Current Situation/Hospitalization: No - Comment as needed  Activities of Daily Living Home Assistive Devices/Equipment: Eyeglasses ADL Screening (condition at time of admission) Patient's cognitive ability adequate to safely complete daily activities?: Yes Is the patient deaf or have difficulty hearing?: No Does the patient have difficulty seeing, even when wearing glasses/contacts?: No Does the patient have difficulty concentrating,  remembering, or making decisions?: No Patient able to express need for assistance with ADLs?: Yes Does the patient have difficulty dressing or bathing?: No Independently performs ADLs?: Yes (appropriate for developmental age) Does the patient have difficulty walking or climbing stairs?: No Weakness of Legs: None Weakness of Arms/Hands: None  Permission Sought/Granted                  Emotional Assessment Appearance:: Appears younger than stated age   Affect (typically observed): Appropriate Orientation: : Oriented to Place, Oriented to Self, Oriented to  Time, Oriented to Situation Alcohol / Substance Use: Not Applicable Psych Involvement: No (comment)  Admission diagnosis:  Peripheral edema [R60.9] CHF exacerbation ([place]) [I50.9] Community acquired pneumonia of right lower lobe of lung [J18.9] Patient Active Problem List   Diagnosis Date Noted  . CHF exacerbation ([place]) 06/14/2020  . Protein calorie malnutrition ([place]) 06/14/2020  . CKD (chronic kidney disease) stage 3, GFR 30-59 ml/min ([facility]) 05/03/2020  . Hypertensive urgency 05/03/2020  . Blurred vision, bilateral 05/03/2020  . Acute on chronic diastolic (congestive) heart failure ([place]) 05/03/2020  . Essential hypertension   . Type 2 diabetes with nephropathy ([place])   . Acute on chronic diastolic CHF (congestive heart failure) ([place])   . Acute renal failure superimposed on stage 3a chronic kidney disease ([place])   . Class 2 obesity   . Bilateral leg edema 04/05/2020   PCP:  [name], MD  Pharmacy:   [place], [place] - [place] [address] [id number] [place] [address] Phone: [phone] Fax: [phone]     Social Determinants of Health (SDOH) Interventions    Readmission Risk Interventions No flowsheet data found.

## 2020-06-16 DIAGNOSIS — E1165 Type 2 diabetes mellitus with hyperglycemia: Secondary | ICD-10-CM

## 2020-06-16 DIAGNOSIS — N1832 Chronic kidney disease, stage 3b: Secondary | ICD-10-CM | POA: Diagnosis not present

## 2020-06-16 DIAGNOSIS — E669 Obesity, unspecified: Secondary | ICD-10-CM | POA: Diagnosis not present

## 2020-06-16 DIAGNOSIS — J189 Pneumonia, unspecified organism: Principal | ICD-10-CM

## 2020-06-16 DIAGNOSIS — I5033 Acute on chronic diastolic (congestive) heart failure: Secondary | ICD-10-CM | POA: Diagnosis not present

## 2020-06-16 LAB — GLUCOSE, CAPILLARY
Glucose-Capillary: 83 mg/dL (ref 70–99)
Glucose-Capillary: 159 mg/dL — ABNORMAL HIGH (ref 70–99)
Glucose-Capillary: 96 mg/dL (ref 70–99)

## 2020-06-16 LAB — RENAL FUNCTION PANEL
Albumin: 3.2 g/dL — ABNORMAL LOW (ref 3.5–5.0)
Anion gap: 8 (ref 5–15)
BUN: 41 mg/dL — ABNORMAL HIGH (ref 6–20)
CO2: 27 mmol/L (ref 22–32)
Calcium: 8.5 mg/dL — ABNORMAL LOW (ref 8.9–10.3)
Chloride: 106 mmol/L (ref 98–111)
Creatinine, Ser: 2.13 mg/dL — ABNORMAL HIGH (ref 0.61–1.24)
GFR, Estimated: 36 mL/min — ABNORMAL LOW (ref >60)
Glucose, Bld: 84 mg/dL (ref 70–99)
Phosphorus: 5.1 mg/dL — ABNORMAL HIGH (ref 2.5–4.6)
Potassium: 4.3 mmol/L (ref 3.5–5.1)
Sodium: 141 mmol/L (ref 135–145)

## 2020-06-16 LAB — CBC WITH DIFFERENTIAL/PLATELET
Abs Immature Granulocytes: 0.01 10*3/uL (ref 0.00–0.07)
Basophils Absolute: 0 10*3/uL (ref 0.0–0.1)
Basophils Relative: 1 %
Eosinophils Absolute: 0.2 10*3/uL (ref 0.0–0.5)
Eosinophils Relative: 3 %
HCT: 30 % — ABNORMAL LOW (ref 39.0–52.0)
Hemoglobin: 9.6 g/dL — ABNORMAL LOW (ref 13.0–17.0)
Immature Granulocytes: 0 %
Lymphocytes Relative: 30 %
Lymphs Abs: 1.4 10*3/uL (ref 0.7–4.0)
MCH: 26.7 pg (ref 26.0–34.0)
MCHC: 32 g/dL (ref 30.0–36.0)
MCV: 83.3 fL (ref 80.0–100.0)
Monocytes Absolute: 0.6 10*3/uL (ref 0.1–1.0)
Monocytes Relative: 12 %
Neutro Abs: 2.6 10*3/uL (ref 1.7–7.7)
Neutrophils Relative %: 54 %
Platelets: 287 10*3/uL (ref 150–400)
RBC: 3.6 MIL/uL — ABNORMAL LOW (ref 4.22–5.81)
RDW: 15.2 % (ref 11.5–15.5)
WBC: 4.7 10*3/uL (ref 4.0–10.5)
nRBC: 0 % (ref 0.0–0.2)

## 2020-06-16 LAB — LIPID PANEL
Cholesterol: 134 mg/dL (ref 0–200)
HDL: 51 mg/dL (ref >40)
LDL Cholesterol: 31 mg/dL (ref 0–99)
Total CHOL/HDL Ratio: 2.6 RATIO
Triglycerides: 258 mg/dL — ABNORMAL HIGH (ref <150)
VLDL: 52 mg/dL — ABNORMAL HIGH (ref 0–40)

## 2020-06-16 LAB — MAGNESIUM: Magnesium: 2.1 mg/dL (ref 1.7–2.4)

## 2020-06-16 NOTE — Progress Notes (Signed)
PROGRESS NOTE  [name] JYN:[id number] DOB: 02-Dec-1965 DOA: 06/14/2020 PCP: [name], MD  Brief History:  54 y.o.male,with history of hypertension, diabetes mellitus type 2, CHF-grade 2 diastolic, presents to the ER with a chief complaint of dyspnea. Patient reports it started today. He has had associated dizziness, fluid retention, and felt near syncopal. He had a cardiologist appointment, but when he got to the clinic he could not breathe and felt really dizzy, and they told him that he was there on the wrong day for his appointment so they wheeled him over here to the ER per his report. Patient reports he was here a month ago.He reports that he was in 100% back to normal at the time of discharge, but he was fine to manage at home until today. He reports he has been taking his diuretics every day. Patient reports that he has had increased orthopnea, and his fluid retention is in his legs and his stomach. Patient does not wear oxygen at home, but is requiring 2 L nasal cannula here in the ER. Patient reports that this fluid has been building up over the last 1 to 2 weeks. He has no associated chest pain, but he does mention occasional exertional palpitations. Patient has a cough that is nonproductive.  He was admitted with marked volume overload and acute symptomatic diastolic heart failure.  Notably, the patient was admitted from 05/03/20 to 05/07/20 due to acute on chronic diastolic CHF with discharge weight of 268.  In addition he was admitted from 04/05/20 to 04/10/20 with acute on chronic diastolic CHF with d/c weight of 269.  He was most recently d/ced home with lasix 60 mg po bid  Assessment/Plan: Acute on chronic diastolic CHF -continue IV lasix 60 mg bid -04/06/20 Echo EF 50%, no WMA, trivial MR, +diastolic -I/Os incomplete -reports previous baseline weight 250-260 -daily weights  Uncontrolled DM2 with hyperglycemia -04/06/20 A1C--9.5 -continue novolog  sliding scale -CBGs controlled this admission -d/c Actos -holding metformin  ?pneumonia -check PCT in am -continue ceftriaxone and azithro for now  HTN -continue amlodipine, hydralazine, labetalol -anticipate improvement with diuresis  CKD 3b -baseline creatinine 1.8-2.1 -monitor with diuresis  HLD -continue pravastatin -sept 2021 LDL 87  Nocturnal Desaturations -He did have a sleep study in 2014 which demonstrated OSA but was intolerant to CPAP at that time. Would benefit from a repeat sleep study as an outpatient.   Morbid Obesity -BMI 40.59 -lifestyle modification     Status is: Inpatient  Remains inpatient appropriate because:IV treatments appropriate due to intensity of illness or inability to take PO   Dispo: The patient is from: Home              Anticipated d/c is to: Home              Anticipated d/c date is: 2 days              Patient currently is not medically stable to d/c.        Family Communication:  no Family at bedside  Consultants:  none  Code Status:  FULL   DVT Prophylaxis:  [place] Heparin  Procedures: As Listed in Progress Note Above  Antibiotics: Ceftriaxone 11/22>> azithro 11/22>>   Subjective:  Patient denies fevers, chills, headache, chest pain, dyspnea, nausea, vomiting, diarrhea, abdominal pain, dysuria, hematuria, hematochezia, and melena.  Objective: Vitals:   06/15/20 2141 06/16/20 0522 06/16/20 1436 06/16/20 1601  BP: Marland Kitchen)  165/90 (!) 151/85 120/65 (!) 141/74  Pulse: 87 88 86 85  Resp: 18 16 16    Temp: 98.8 F (37.1 C) 98.2 F (36.8 C) 97.8 F (36.6 C)   TempSrc: Oral Oral Oral   SpO2: 97% 99% 93% 94%  Weight:      Height:        Intake/Output Summary (Last 24 hours) at 06/16/2020 1704 Last data filed at 06/16/2020 1436 Gross per 24 hour  Intake 720 ml  Output 3525 ml  Net -2805 ml   Weight change:  Exam:   General:  Pt is alert, follows commands appropriately, not in acute distress  HEENT: No  icterus, No thrush, No neck mass, [place]/AT  Cardiovascular: RRR, S1/S2, no rubs, no gallops  Respiratory: bibasilar crackles. No wheeze  Abdomen: Soft/+BS, non tender, non distended, no guarding  Extremities: 2+LE edema, No lymphangitis, No petechiae, No rashes, no synovitis   Data Reviewed: I have personally reviewed following labs and imaging studies Basic Metabolic Panel: Recent Labs  Lab 06/14/20 1520 06/15/20 0552 06/16/20 0823  NA 140 142 141  K 4.6 4.3 4.3  CL 107 107 106  CO2 25 27 27   GLUCOSE 116* 81 84  BUN 43* 43* 41*  CREATININE 2.09* 2.10* 2.13*  CALCIUM 8.3* 8.4* 8.5*  MG  --  2.0 2.1  PHOS  --   --  5.1*   Liver Function Tests: Recent Labs  Lab 06/14/20 1520 06/15/20 0552 06/16/20 0823  AST 20 14*  --   ALT 29 24  --   ALKPHOS 110 104  --   BILITOT 0.4 0.5  --   PROT 6.4* 6.2*  --   ALBUMIN 3.4* 3.2* 3.2*   No results for input(s): LIPASE, AMYLASE in the last 168 hours. No results for input(s): AMMONIA in the last 168 hours. Coagulation Profile: No results for input(s): INR, PROTIME in the last 168 hours. CBC: Recent Labs  Lab 06/14/20 1520 06/16/20 0823  WBC 5.0 4.7  NEUTROABS 3.2 2.6  HGB 9.7* 9.6*  HCT 30.2* 30.0*  MCV 82.7 83.3  PLT 272 287   Cardiac Enzymes: No results for input(s): CKTOTAL, CKMB, CKMBINDEX, TROPONINI in the last 168 hours. BNP: Invalid input(s): POCBNP CBG: Recent Labs  Lab 06/15/20 0729 06/15/20 2143 06/16/20 0728 06/16/20 1103  GLUCAP 68* 81 83 159*   HbA1C: No results for input(s): HGBA1C in the last 72 hours. Urine analysis:    Component Value Date/Time   COLORURINE YELLOW 06/14/2020 [place] 06/14/2020 1452   LABSPEC 1.010 06/14/2020 1452   PHURINE 5.0 06/14/2020 1452   GLUCOSEU NEGATIVE 06/14/2020 1452   HGBUR NEGATIVE 06/14/2020 1452   BILIRUBINUR NEGATIVE 06/14/2020 1452   KETONESUR NEGATIVE 06/14/2020 1452   PROTEINUR 100 (A) 06/14/2020 1452   NITRITE NEGATIVE 06/14/2020  1452   LEUKOCYTESUR NEGATIVE 06/14/2020 1452   Sepsis Labs: @LABRCNTIP (procalcitonin:4,lacticidven:4) ) Recent Results (from the past 240 hour(s))  Resp Panel by RT-PCR (Flu A&B, Covid) Nasopharyngeal Swab     Status: None   Collection Time: 06/14/20  2:29 PM   Specimen: Nasopharyngeal Swab; Nasopharyngeal(NP) swabs in vial transport medium  Result Value Ref Range Status   SARS Coronavirus 2 by RT PCR NEGATIVE NEGATIVE Final    Comment: (NOTE) SARS-CoV-2 target nucleic acids are NOT DETECTED.  The SARS-CoV-2 RNA is generally detectable in upper respiratory specimens during the acute phase of infection. The lowest concentration of SARS-CoV-2 viral copies this assay can detect is 138 copies/mL. A  negative result does not preclude SARS-Cov-2 infection and should not be used as the sole basis for treatment or other patient management decisions. A negative result may occur with  improper specimen collection/handling, submission of specimen other than nasopharyngeal swab, presence of viral mutation(s) within the areas targeted by this assay, and inadequate number of viral copies(<138 copies/mL). A negative result must be combined with clinical observations, patient history, and epidemiological information. The expected result is Negative.  Fact Sheet for Patients:  EntrepreneurPulse.com.au  Fact Sheet for Healthcare Providers:  IncredibleEmployment.be  This test is no t yet approved or cleared by the [place] FDA and  has been authorized for detection and/or diagnosis of SARS-CoV-2 by FDA under an Emergency Use Authorization (EUA). This EUA will remain  in effect (meaning this test can be used) for the duration of the COVID-19 declaration under Section 564(b)(1) of the Act, 21 U.S.C.section 360bbb-3(b)(1), unless the authorization is terminated  or revoked sooner.       Influenza A by PCR NEGATIVE NEGATIVE Final   Influenza B by PCR  NEGATIVE NEGATIVE Final    Comment: (NOTE) The Xpert Xpress SARS-CoV-2/FLU/RSV plus assay is intended as an aid in the diagnosis of influenza from Nasopharyngeal swab specimens and should not be used as a sole basis for treatment. Nasal washings and aspirates are unacceptable for Xpert Xpress SARS-CoV-2/FLU/RSV testing.  Fact Sheet for Patients: EntrepreneurPulse.com.au  Fact Sheet for Healthcare Providers: IncredibleEmployment.be  This test is not yet approved or cleared by the [place] FDA and has been authorized for detection and/or diagnosis of SARS-CoV-2 by FDA under an Emergency Use Authorization (EUA). This EUA will remain in effect (meaning this test can be used) for the duration of the COVID-19 declaration under Section 564(b)(1) of the Act, 21 U.S.C. section 360bbb-3(b)(1), unless the authorization is terminated or revoked.  Performed at [facility], [address], [place], [place] [address]      Scheduled Meds: . amLODipine  10 mg Oral Daily  . furosemide  60 mg Intravenous Q12H  . heparin  5,000 Units Subcutaneous Q8H  . hydrALAZINE  100 mg Oral TID  . labetalol  200 mg Oral BID  . multivitamin with minerals  1 tablet Oral Daily  . pravastatin  40 mg Oral Daily   Continuous Infusions: . azithromycin 500 mg (06/15/20 1557)  . cefTRIAXone (ROCEPHIN)  IV 2 g (06/16/20 1621)    Procedures/Studies: DG Chest 2 View  Result Date: 06/14/2020 CLINICAL DATA:  Shortness of breath and lower extremity edema. Leg swelling for 3 months but worsened today. History of congestive heart failure. Negative COVID test. EXAM: CHEST - 2 VIEW COMPARISON:  chest x-ray 07/20/2011, 03/03/2020, 04/05/2020, 05/03/2020. FINDINGS: The heart size and mediastinal contours are unchanged. Interval development of retrocardiac airspace opacity as well as right lower lobe hazy airspace opacity. Similar-appearing increased interstitial markings with no overt  pulmonary edema. Interval increase in size of bilateral trace to small volume pleural effusions. No pneumothorax. No acute osseous abnormality. IMPRESSION: 1. Interval increase in size of bilateral trace to small volume pleural effusions. 2. Right lower lobe and retrocardiac opacities could represent combination of atelectasis, infection, inflammation. Followup PA and lateral chest X-ray is recommended in 3-4 weeks following therapy to ensure resolution and exclude underlying malignancy. Electronically Signed   By: Iven Finn M.D.   On: 06/14/2020 15:56   DG Chest Port 1 View  Result Date: 06/15/2020 CLINICAL DATA:  CHF exacerbation. EXAM: PORTABLE CHEST 1 VIEW COMPARISON:  06/14/2020.  FINDINGS: Cardiomegaly with pulmonary venous congestion. Bilateral pulmonary infiltrates/edema, right side greater than left. Bilateral pleural effusions, right side greater than left. These findings have progressed slightly from prior exam. Low lung volumes. No pneumothorax. IMPRESSION: Cardiomegaly with pulmonary venous congestion. Bilateral pulmonary infiltrates/edema, right side greater than left. Bilateral pleural effusions, right side greater than left. These findings have progressed slightly from prior exam. Electronically Signed   By: [place]   On: 06/15/2020 06:05    Orson Eva, DO  [facility]  If 7PM-7AM, please contact night-coverage [email or web address] Password [email or web address] 06/16/2020, 5:04 PM   LOS: 2 days

## 2020-06-16 NOTE — Progress Notes (Signed)
Nutrition Education Note  RD consulted for nutrition education regarding onset CHF. Patient has a history of DM-2, HTN. He presents with dyspnea and found to be volume overloaded.  RD provided "Heart Failure Nutrition Therapy" handout from the Academy of Nutrition and Dietetics.   Patient says, "my wife has me on a no salt diet". She is the primary cook and based on recall patient is consuming a heart healthy diet overall. He has eggs with grits or oatmeal and fruit for breakfast and dinner is baked chicken and veggies and a starch. His primary beverage of choice is water. We talked about fluid intake goals and his current restriction of 7-8 cups daily.   RD discussed why it is important for patient to adhere to diet recommendations, and emphasized the role of fluids, foods to avoid, and importance of weighing self daily.   Provided examples on ways to decrease sodium intake in diet. Discouraged intake of processed foods and use of salt shaker. Encouraged fresh fruits and vegetables as well as whole grain sources of carbohydrates to maximize fiber intake.   Teach back method used. Expect good compliance.  Body mass index is 40.59 kg/m. Pt meets criteria for obese at baseline. Current BMI skewed due to his volume overloaded status.   Intake/Output Summary (Last 24 hours) at 06/16/2020 9767 Last data filed at 06/16/2020 0900 Gross per 24 hour  Intake 1309.31 ml  Output 1925 ml  Net -615.69 ml  Dry wt 275 lb per patient. His weight was 121.7 kg (267.7 lb) on 05/07/20. Currently 132 kg (290.4 kg). Patient is aware of recommended daily weight monitoring at home.  Current diet order is Heart Healthy/CHO modified with 1800 ml fluid restriction, patient is consuming approximately 100% of meals at this time.     Labs and medications reviewed.  BMP Latest Ref Rng & Units 06/16/2020 06/15/2020 06/14/2020  Glucose 70 - 99 mg/dL 84 81 116(H)  BUN 6 - 20 mg/dL 41(H) 43(H) 43(H)  Creatinine 0.61 -  1.24 mg/dL 2.13(H) 2.10(H) 2.09(H)  BUN/Creat Ratio 9 - 20 - - -  Sodium 135 - 145 mmol/L 141 142 140  Potassium 3.5 - 5.1 mmol/L 4.3 4.3 4.6  Chloride 98 - 111 mmol/L 106 107 107  CO2 22 - 32 mmol/L 27 27 25   Calcium 8.9 - 10.3 mg/dL 8.5(L) 8.4(L) 8.3(L)    No further nutrition interventions warranted at this time. RD contact information provided. If additional nutrition issues arise, please re-consult RD.   Colman Cater MS,RD,CSG,LDN Office: [phone] Pager: [phone]

## 2020-06-16 NOTE — Treatment Plan (Signed)
Pt educated on elevating BLE to decrease edema and increase effects of diuretic

## 2020-06-17 DIAGNOSIS — E669 Obesity, unspecified: Secondary | ICD-10-CM | POA: Diagnosis not present

## 2020-06-17 DIAGNOSIS — I5033 Acute on chronic diastolic (congestive) heart failure: Secondary | ICD-10-CM | POA: Diagnosis not present

## 2020-06-17 DIAGNOSIS — I10 Essential (primary) hypertension: Secondary | ICD-10-CM | POA: Diagnosis not present

## 2020-06-17 DIAGNOSIS — E1165 Type 2 diabetes mellitus with hyperglycemia: Secondary | ICD-10-CM | POA: Diagnosis not present

## 2020-06-17 LAB — GLUCOSE, CAPILLARY
Glucose-Capillary: 142 mg/dL — ABNORMAL HIGH (ref 70–99)
Glucose-Capillary: 86 mg/dL (ref 70–99)
Glucose-Capillary: 83 mg/dL (ref 70–99)
Glucose-Capillary: 133 mg/dL — ABNORMAL HIGH (ref 70–99)
Glucose-Capillary: 194 mg/dL — ABNORMAL HIGH (ref 70–99)

## 2020-06-17 LAB — RENAL FUNCTION PANEL
Albumin: 3.4 g/dL — ABNORMAL LOW (ref 3.5–5.0)
Anion gap: 10 (ref 5–15)
BUN: 41 mg/dL — ABNORMAL HIGH (ref 6–20)
CO2: 26 mmol/L (ref 22–32)
Calcium: 8.5 mg/dL — ABNORMAL LOW (ref 8.9–10.3)
Chloride: 103 mmol/L (ref 98–111)
Creatinine, Ser: 2.16 mg/dL — ABNORMAL HIGH (ref 0.61–1.24)
GFR, Estimated: 35 mL/min — ABNORMAL LOW (ref >60)
Glucose, Bld: 90 mg/dL (ref 70–99)
Phosphorus: 4.6 mg/dL (ref 2.5–4.6)
Potassium: 4.3 mmol/L (ref 3.5–5.1)
Sodium: 139 mmol/L (ref 135–145)

## 2020-06-17 LAB — PROCALCITONIN: Procalcitonin: <0.1 ng/mL

## 2020-06-17 LAB — MAGNESIUM: Magnesium: 1.9 mg/dL (ref 1.7–2.4)

## 2020-06-17 NOTE — Progress Notes (Signed)
PROGRESS NOTE  [name] LOV:[id number] DOB: [birth year] DOA: 06/14/2020 PCP: [name], MD  Brief History:  54 y.o.male,with history of hypertension, diabetes mellitus type 2, CHF-grade 2 diastolic, presents to the ER with a chief complaint of dyspnea. Patient reports it started today. He has had associated dizziness, fluid retention, and felt near syncopal. He had a cardiologist appointment, but when he got to the clinic he could not breathe and felt really dizzy, and they told him that he was there on the wrong day for his appointment so they wheeled him over here to the ER per his report. Patient reports he was here a month ago.He reports that he was in 100% back to normal at the time of discharge, but he was fine to manage at home until today. He reports he has been taking his diuretics every day. Patient reports that he has had increased orthopnea, and his fluid retention is in his legs and his stomach. Patient does not wear oxygen at home, but is requiring 2 L nasal cannula here in the ER. Patient reports that this fluid has been building up over the last 1 to 2 weeks. He has no associated chest pain, but he does mention occasional exertional palpitations. Patient has a cough that is nonproductive.He was admitted with marked volume overload and acute symptomatic diastolic heart failure. Notably, the patient was admitted from 05/03/20 to 05/07/20 due to acute on chronic diastolic CHF with discharge weight of 268.  In addition he was admitted from 04/05/20 to 04/10/20 with acute on chronic diastolic CHF with d/c weight of 269.  He was most recently d/ced home with lasix 60 mg po bid  Assessment/Plan: Acute on chronic diastolic CHF -continue IV lasix 60 mg bid -remains clinically fluid overloaded -04/06/20 Echo EF 50%, no WMA, trivial MR, +diastolic -I/Os incomplete-neg 2.5L -reports previous baseline weight 250-260 -daily weights  Uncontrolled DM2 with  hyperglycemia -04/06/20 A1C--9.5 -continue novolog sliding scale -CBGs controlled this admission -d/c Actos -holding metformin  ?pneumonia -check PCT<0.10 -discontinue ceftriaxone and azithro and monitor clinically  HTN -continue amlodipine, hydralazine, labetalol -anticipate improvement with diuresis  CKD 3b -baseline creatinine 1.8-2.1 -monitor with diuresis  HLD -continue pravastatin -sept 2021 LDL 87  Nocturnal Desaturations -He did have a sleep study in 2014 which demonstrated OSA but was intolerant to CPAP at that time. Would benefit from a repeat sleep study as an outpatient.  Morbid Obesity -BMI 40.59 -lifestyle modification     Status is: Inpatient  Remains inpatient appropriate because:IV treatments appropriate due to intensity of illness or inability to take PO   Dispo: The patient is from: Home  Anticipated d/c is to: Home  Anticipated d/c date is: 2 days  Patient currently is not medically stable to d/c.        Family Communication:  no Family at bedside  Consultants:  none  Code Status:  FULL   DVT Prophylaxis:  [place] Heparin  Procedures: As Listed in Progress Note Above  Antibiotics: Ceftriaxone 11/22>> azithro 11/22>>   Subjective: Patient denies fevers, chills, headache, chest pain, dyspnea, nausea, vomiting, diarrhea, abdominal pain, dysuria, hematuria, hematochezia, and melena. Still has some dyspnea on exertion  Objective: Vitals:   06/16/20 1601 06/16/20 2105 06/17/20 0513 06/17/20 1443  BP: (!) 141/74 (!) 149/83 (!) 146/80 127/76  Pulse: 85 88 91 88  Resp:  16 16 16   Temp:  99.1 F (37.3 C) 99.4 F (37.4 C) 98.3 F (  36.8 C)  TempSrc:  Oral  Oral  SpO2: 94% 94% 91% 94%  Weight:   127.1 kg   Height:        Intake/Output Summary (Last 24 hours) at 06/17/2020 1815 Last data filed at 06/17/2020 1700 Gross per 24 hour  Intake 1070 ml  Output 3500 ml  Net  -2430 ml   Weight change:  Exam:   General:  Pt is alert, follows commands appropriately, not in acute distress  HEENT: No icterus, No thrush, No neck mass, [place]/AT  Cardiovascular: RRR, S1/S2, no rubs, no gallops  Respiratory: bibasilar crackles. No wheeze  Abdomen: Soft/+BS, non tender, non distended, no guarding  Extremities: 2+LE edema, No lymphangitis, No petechiae, No rashes, no synovitis   Data Reviewed: I have personally reviewed following labs and imaging studies Basic Metabolic Panel: Recent Labs  Lab 06/14/20 1520 06/15/20 0552 06/16/20 0823 06/17/20 0602  NA 140 142 141 139  K 4.6 4.3 4.3 4.3  CL 107 107 106 103  CO2 25 27 27 26   GLUCOSE 116* 81 84 90  BUN 43* 43* 41* 41*  CREATININE 2.09* 2.10* 2.13* 2.16*  CALCIUM 8.3* 8.4* 8.5* 8.5*  MG  --  2.0 2.1 1.9  PHOS  --   --  5.1* 4.6   Liver Function Tests: Recent Labs  Lab 06/14/20 1520 06/15/20 0552 06/16/20 0823 06/17/20 0602  AST 20 14*  --   --   ALT 29 24  --   --   ALKPHOS 110 104  --   --   BILITOT 0.4 0.5  --   --   PROT 6.4* 6.2*  --   --   ALBUMIN 3.4* 3.2* 3.2* 3.4*   No results for input(s): LIPASE, AMYLASE in the last 168 hours. No results for input(s): AMMONIA in the last 168 hours. Coagulation Profile: No results for input(s): INR, PROTIME in the last 168 hours. CBC: Recent Labs  Lab 06/14/20 1520 06/16/20 0823  WBC 5.0 4.7  NEUTROABS 3.2 2.6  HGB 9.7* 9.6*  HCT 30.2* 30.0*  MCV 82.7 83.3  PLT 272 287   Cardiac Enzymes: No results for input(s): CKTOTAL, CKMB, CKMBINDEX, TROPONINI in the last 168 hours. BNP: Invalid input(s): POCBNP CBG: Recent Labs  Lab 06/16/20 1103 06/16/20 2107 06/17/20 0625 06/17/20 0759 06/17/20 1147  GLUCAP 159* 96 86 83 133*   HbA1C: No results for input(s): HGBA1C in the last 72 hours. Urine analysis:    Component Value Date/Time   COLORURINE YELLOW 06/14/2020 [place] 06/14/2020 1452   LABSPEC 1.010 06/14/2020  1452   PHURINE 5.0 06/14/2020 1452   GLUCOSEU NEGATIVE 06/14/2020 1452   HGBUR NEGATIVE 06/14/2020 1452   BILIRUBINUR NEGATIVE 06/14/2020 1452   KETONESUR NEGATIVE 06/14/2020 1452   PROTEINUR 100 (A) 06/14/2020 1452   NITRITE NEGATIVE 06/14/2020 1452   LEUKOCYTESUR NEGATIVE 06/14/2020 1452   Sepsis Labs: @LABRCNTIP (procalcitonin:4,lacticidven:4) ) Recent Results (from the past 240 hour(s))  Resp Panel by RT-PCR (Flu A&B, Covid) Nasopharyngeal Swab     Status: None   Collection Time: 06/14/20  2:29 PM   Specimen: Nasopharyngeal Swab; Nasopharyngeal(NP) swabs in vial transport medium  Result Value Ref Range Status   SARS Coronavirus 2 by RT PCR NEGATIVE NEGATIVE Final    Comment: (NOTE) SARS-CoV-2 target nucleic acids are NOT DETECTED.  The SARS-CoV-2 RNA is generally detectable in upper respiratory specimens during the acute phase of infection. The lowest concentration of SARS-CoV-2 viral copies this assay can detect is  138 copies/mL. A negative result does not preclude SARS-Cov-2 infection and should not be used as the sole basis for treatment or other patient management decisions. A negative result may occur with  improper specimen collection/handling, submission of specimen other than nasopharyngeal swab, presence of viral mutation(s) within the areas targeted by this assay, and inadequate number of viral copies(<138 copies/mL). A negative result must be combined with clinical observations, patient history, and epidemiological information. The expected result is Negative.  Fact Sheet for Patients:  EntrepreneurPulse.com.au  Fact Sheet for Healthcare Providers:  IncredibleEmployment.be  This test is no t yet approved or cleared by the [place] FDA and  has been authorized for detection and/or diagnosis of SARS-CoV-2 by FDA under an Emergency Use Authorization (EUA). This EUA will remain  in effect (meaning this test can be used) for  the duration of the COVID-19 declaration under Section 564(b)(1) of the Act, 21 U.S.C.section 360bbb-3(b)(1), unless the authorization is terminated  or revoked sooner.       Influenza A by PCR NEGATIVE NEGATIVE Final   Influenza B by PCR NEGATIVE NEGATIVE Final    Comment: (NOTE) The Xpert Xpress SARS-CoV-2/FLU/RSV plus assay is intended as an aid in the diagnosis of influenza from Nasopharyngeal swab specimens and should not be used as a sole basis for treatment. Nasal washings and aspirates are unacceptable for Xpert Xpress SARS-CoV-2/FLU/RSV testing.  Fact Sheet for Patients: EntrepreneurPulse.com.au  Fact Sheet for Healthcare Providers: IncredibleEmployment.be  This test is not yet approved or cleared by the [place] FDA and has been authorized for detection and/or diagnosis of SARS-CoV-2 by FDA under an Emergency Use Authorization (EUA). This EUA will remain in effect (meaning this test can be used) for the duration of the COVID-19 declaration under Section 564(b)(1) of the Act, 21 U.S.C. section 360bbb-3(b)(1), unless the authorization is terminated or revoked.  Performed at [facility], [address] [place], [place] [address]      Scheduled Meds: . amLODipine  10 mg Oral Daily  . furosemide  60 mg Intravenous Q12H  . heparin  5,000 Units Subcutaneous Q8H  . hydrALAZINE  100 mg Oral TID  . labetalol  200 mg Oral BID  . multivitamin with minerals  1 tablet Oral Daily  . pravastatin  40 mg Oral Daily   Continuous Infusions: . azithromycin 500 mg (06/17/20 1733)  . cefTRIAXone (ROCEPHIN)  IV 2 g (06/17/20 1620)    Procedures/Studies: DG Chest 2 View  Result Date: 06/14/2020 CLINICAL DATA:  Shortness of breath and lower extremity edema. Leg swelling for 3 months but worsened today. History of congestive heart failure. Negative COVID test. EXAM: CHEST - 2 VIEW COMPARISON:  chest x-ray 07/20/2011, 03/03/2020, 04/05/2020,  05/03/2020. FINDINGS: The heart size and mediastinal contours are unchanged. Interval development of retrocardiac airspace opacity as well as right lower lobe hazy airspace opacity. Similar-appearing increased interstitial markings with no overt pulmonary edema. Interval increase in size of bilateral trace to small volume pleural effusions. No pneumothorax. No acute osseous abnormality. IMPRESSION: 1. Interval increase in size of bilateral trace to small volume pleural effusions. 2. Right lower lobe and retrocardiac opacities could represent combination of atelectasis, infection, inflammation. Followup PA and lateral chest X-ray is recommended in 3-4 weeks following therapy to ensure resolution and exclude underlying malignancy. Electronically Signed   By: Iven Finn M.D.   On: 06/14/2020 15:56   DG Chest Port 1 View  Result Date: 06/15/2020 CLINICAL DATA:  CHF exacerbation. EXAM: PORTABLE CHEST 1 VIEW  COMPARISON:  06/14/2020. FINDINGS: Cardiomegaly with pulmonary venous congestion. Bilateral pulmonary infiltrates/edema, right side greater than left. Bilateral pleural effusions, right side greater than left. These findings have progressed slightly from prior exam. Low lung volumes. No pneumothorax. IMPRESSION: Cardiomegaly with pulmonary venous congestion. Bilateral pulmonary infiltrates/edema, right side greater than left. Bilateral pleural effusions, right side greater than left. These findings have progressed slightly from prior exam. Electronically Signed   By: [place]   On: 06/15/2020 06:05    Orson Eva, DO  [facility]  If 7PM-7AM, please contact night-coverage [email or web address] Password [email or web address] 06/17/2020, 6:15 PM   LOS: 3 days

## 2020-06-17 NOTE — Plan of Care (Signed)
  Problem: Education: Goal: Knowledge of General Education information will improve Description Including pain rating scale, medication(s)/side effects and non-pharmacologic comfort measures Outcome: Progressing   Problem: Health Behavior/Discharge Planning: Goal: Ability to manage health-related needs will improve Outcome: Progressing   

## 2020-06-18 DIAGNOSIS — E669 Obesity, unspecified: Secondary | ICD-10-CM | POA: Diagnosis not present

## 2020-06-18 DIAGNOSIS — I5033 Acute on chronic diastolic (congestive) heart failure: Secondary | ICD-10-CM | POA: Diagnosis not present

## 2020-06-18 DIAGNOSIS — E1165 Type 2 diabetes mellitus with hyperglycemia: Secondary | ICD-10-CM | POA: Diagnosis not present

## 2020-06-18 LAB — GLUCOSE, CAPILLARY
Glucose-Capillary: 92 mg/dL (ref 70–99)
Glucose-Capillary: 143 mg/dL — ABNORMAL HIGH (ref 70–99)
Glucose-Capillary: 161 mg/dL — ABNORMAL HIGH (ref 70–99)

## 2020-06-18 LAB — RENAL FUNCTION PANEL
Albumin: 3.3 g/dL — ABNORMAL LOW (ref 3.5–5.0)
Anion gap: 9 (ref 5–15)
BUN: 44 mg/dL — ABNORMAL HIGH (ref 6–20)
CO2: 28 mmol/L (ref 22–32)
Calcium: 8.4 mg/dL — ABNORMAL LOW (ref 8.9–10.3)
Chloride: 99 mmol/L (ref 98–111)
Creatinine, Ser: 2.26 mg/dL — ABNORMAL HIGH (ref 0.61–1.24)
GFR, Estimated: 33 mL/min — ABNORMAL LOW (ref >60)
Glucose, Bld: 98 mg/dL (ref 70–99)
Phosphorus: 4.8 mg/dL — ABNORMAL HIGH (ref 2.5–4.6)
Potassium: 4.2 mmol/L (ref 3.5–5.1)
Sodium: 136 mmol/L (ref 135–145)

## 2020-06-18 LAB — MAGNESIUM: Magnesium: 2.1 mg/dL (ref 1.7–2.4)

## 2020-06-18 NOTE — Progress Notes (Signed)
PROGRESS NOTE  [name] OZH:[id number] DOB: 12-12-1965 DOA: 06/14/2020 PCP: [name], MD  Brief History: 54 y.o.male,with history of hypertension, diabetes mellitus type 2, CHF-grade 2 diastolic, presents to the ER with a chief complaint of dyspnea. Patient reports it started today. He has had associated dizziness, fluid retention, and felt near syncopal. He had a cardiologist appointment, but when he got to the clinic he could not breathe and felt really dizzy, and they told him that he was there on the wrong day for his appointment so they wheeled him over here to the ER per his report. Patient reports he was here a month ago.He reports that he was in 100% back to normal at the time of discharge, but he was fine to manage at home until today. He reports he has been taking his diuretics every day. Patient reports that he has had increased orthopnea, and his fluid retention is in his legs and his stomach. Patient does not wear oxygen at home, but is requiring 2 L nasal cannula here in the ER. Patient reports that this fluid has been building up over the last 1 to 2 weeks. He has no associated chest pain, but he does mention occasional exertional palpitations. Patient has a cough that is nonproductive.He was admitted with marked volume overload and acute symptomatic diastolic heart failure. Notably, the patient was admitted from 05/03/20 to 05/07/20 due to acute on chronic diastolic CHF with discharge weight of 268. In addition he was admitted from 04/05/20 to 04/10/20 with acute on chronic diastolic CHF with d/c weight of 269. He was most recently d/ced home with lasix 60 mg po bid  Assessment/Plan: Acute on chronic diastolic CHF -continue IV lasix 60 mg bid -remains clinically fluid overloaded -04/06/20 Echo EF 50%, no WMA, trivial MR, +diastolic -I/Os incomplete-neg 4.4L -reports previous baseline weight 250-260 -weight 272.9 on 11/26 -daily  weights  Uncontrolled DM2 with hyperglycemia -04/06/20 A1C--9.5 -continue novolog sliding scale -CBGs controlled this admission -d/c Actos -holding metformin  ?pneumonia -check PCT<0.10 -discontinue ceftriaxone and azithro and monitor clinically  HTN -continue amlodipine, hydralazine, labetalol -anticipate improvement with diuresis  CKD 3b -baseline creatinine 1.8-2.1 -monitor with diuresis  HLD -continue pravastatin -sept 2021 LDL 87  Nocturnal Desaturations -He did have a sleep study in 2014 which demonstrated OSA but was intolerant to CPAP at that time. Would benefit from a repeat sleep study as an outpatient.  Morbid Obesity -BMI 40.59 -lifestyle modification     Status is: Inpatient  Remains inpatient appropriate because:IV treatments appropriate due to intensity of illness or inability to take PO   Dispo: The patient is from:Home Anticipated d/c is GE:[facility] Anticipated d/c date is: 2 days Patient currently is not medically stable to d/c.        Family Communication:daughter updated 11/26  Consultants:none  Code Status: FULL   DVT Prophylaxis: [place] Heparin  Procedures: As Listed in Progress Note Above  Antibiotics: Ceftriaxone 11/22>>11/26 azithro11/22>>11/25   Subjective: Patient denies fevers, chills, headache, chest pain, dyspnea, nausea, vomiting, diarrhea, abdominal pain, dysuria, hematuria, hematochezia, and melena.   Objective: Vitals:   06/17/20 2111 06/17/20 2205 06/18/20 0607 06/18/20 1418  BP:  (!) 152/87 (!) 154/86 130/73  Pulse:  88 93 87  Resp:  16 18 18   Temp:  99 F (37.2 C) (!) 100.7 F (38.2 C) 98.2 F (36.8 C)  TempSrc:   Oral Oral  SpO2: 94% (!) 85% (!) 89% 96%  Weight:   124.9 kg   Height:        Intake/Output Summary (Last 24 hours) at 06/18/2020 1740 Last data filed at 06/18/2020 0300 Gross per 24 hour  Intake --  Output 1000 ml   Net -1000 ml   Weight change: -2.225 kg Exam:   General:  Pt is alert, follows commands appropriately, not in acute distress  HEENT: No icterus, No thrush, No neck mass, [place]/AT  Cardiovascular: RRR, S1/S2, no rubs, no gallops  Respiratory: bibasilar crackles negative wheeze  Abdomen: Soft/+BS, non tender, non distended, no guarding  Extremities: No edema, No lymphangitis, No petechiae, No rashes, no synovitis   Data Reviewed: I have personally reviewed following labs and imaging studies Basic Metabolic Panel: Recent Labs  Lab 06/14/20 1520 06/15/20 0552 06/16/20 0823 06/17/20 0602 06/18/20 0818  NA 140 142 141 139 136  K 4.6 4.3 4.3 4.3 4.2  CL 107 107 106 103 99  CO2 25 27 27 26 28   GLUCOSE 116* 81 84 90 98  BUN 43* 43* 41* 41* 44*  CREATININE 2.09* 2.10* 2.13* 2.16* 2.26*  CALCIUM 8.3* 8.4* 8.5* 8.5* 8.4*  MG  --  2.0 2.1 1.9 2.1  PHOS  --   --  5.1* 4.6 4.8*   Liver Function Tests: Recent Labs  Lab 06/14/20 1520 06/15/20 0552 06/16/20 0823 06/17/20 0602 06/18/20 0818  AST 20 14*  --   --   --   ALT 29 24  --   --   --   ALKPHOS 110 104  --   --   --   BILITOT 0.4 0.5  --   --   --   PROT 6.4* 6.2*  --   --   --   ALBUMIN 3.4* 3.2* 3.2* 3.4* 3.3*   No results for input(s): LIPASE, AMYLASE in the last 168 hours. No results for input(s): AMMONIA in the last 168 hours. Coagulation Profile: No results for input(s): INR, PROTIME in the last 168 hours. CBC: Recent Labs  Lab 06/14/20 1520 06/16/20 0823  WBC 5.0 4.7  NEUTROABS 3.2 2.6  HGB 9.7* 9.6*  HCT 30.2* 30.0*  MCV 82.7 83.3  PLT 272 287   Cardiac Enzymes: No results for input(s): CKTOTAL, CKMB, CKMBINDEX, TROPONINI in the last 168 hours. BNP: Invalid input(s): POCBNP CBG: Recent Labs  Lab 06/17/20 1147 06/17/20 1624 06/18/20 0729 06/18/20 1128 06/18/20 1711  GLUCAP 133* 194* 92 143* 161*   HbA1C: No results for input(s): HGBA1C in the last 72 hours. Urine analysis:     Component Value Date/Time   COLORURINE YELLOW 06/14/2020 [place] 06/14/2020 1452   LABSPEC 1.010 06/14/2020 1452   PHURINE 5.0 06/14/2020 1452   GLUCOSEU NEGATIVE 06/14/2020 1452   HGBUR NEGATIVE 06/14/2020 1452   BILIRUBINUR NEGATIVE 06/14/2020 1452   KETONESUR NEGATIVE 06/14/2020 1452   PROTEINUR 100 (A) 06/14/2020 1452   NITRITE NEGATIVE 06/14/2020 1452   LEUKOCYTESUR NEGATIVE 06/14/2020 1452   Sepsis Labs: @LABRCNTIP (procalcitonin:4,lacticidven:4) ) Recent Results (from the past 240 hour(s))  Resp Panel by RT-PCR (Flu A&B, Covid) Nasopharyngeal Swab     Status: None   Collection Time: 06/14/20  2:29 PM   Specimen: Nasopharyngeal Swab; Nasopharyngeal(NP) swabs in vial transport medium  Result Value Ref Range Status   SARS Coronavirus 2 by RT PCR NEGATIVE NEGATIVE Final    Comment: (NOTE) SARS-CoV-2 target nucleic acids are NOT DETECTED.  The SARS-CoV-2 RNA is generally detectable in upper respiratory specimens during the acute  phase of infection. The lowest concentration of SARS-CoV-2 viral copies this assay can detect is 138 copies/mL. A negative result does not preclude SARS-Cov-2 infection and should not be used as the sole basis for treatment or other patient management decisions. A negative result may occur with  improper specimen collection/handling, submission of specimen other than nasopharyngeal swab, presence of viral mutation(s) within the areas targeted by this assay, and inadequate number of viral copies(<138 copies/mL). A negative result must be combined with clinical observations, patient history, and epidemiological information. The expected result is Negative.  Fact Sheet for Patients:  EntrepreneurPulse.com.au  Fact Sheet for Healthcare Providers:  IncredibleEmployment.be  This test is no t yet approved or cleared by the [place] FDA and  has been authorized for detection and/or diagnosis of  SARS-CoV-2 by FDA under an Emergency Use Authorization (EUA). This EUA will remain  in effect (meaning this test can be used) for the duration of the COVID-19 declaration under Section 564(b)(1) of the Act, 21 U.S.C.section 360bbb-3(b)(1), unless the authorization is terminated  or revoked sooner.       Influenza A by PCR NEGATIVE NEGATIVE Final   Influenza B by PCR NEGATIVE NEGATIVE Final    Comment: (NOTE) The Xpert Xpress SARS-CoV-2/FLU/RSV plus assay is intended as an aid in the diagnosis of influenza from Nasopharyngeal swab specimens and should not be used as a sole basis for treatment. Nasal washings and aspirates are unacceptable for Xpert Xpress SARS-CoV-2/FLU/RSV testing.  Fact Sheet for Patients: EntrepreneurPulse.com.au  Fact Sheet for Healthcare Providers: IncredibleEmployment.be  This test is not yet approved or cleared by the [place] FDA and has been authorized for detection and/or diagnosis of SARS-CoV-2 by FDA under an Emergency Use Authorization (EUA). This EUA will remain in effect (meaning this test can be used) for the duration of the COVID-19 declaration under Section 564(b)(1) of the Act, 21 U.S.C. section 360bbb-3(b)(1), unless the authorization is terminated or revoked.  Performed at [facility], [address] [place],  [address]      Scheduled Meds: . amLODipine  10 mg Oral Daily  . furosemide  60 mg Intravenous Q12H  . heparin  5,000 Units Subcutaneous Q8H  . hydrALAZINE  100 mg Oral TID  . labetalol  200 mg Oral BID  . multivitamin with minerals  1 tablet Oral Daily  . pravastatin  40 mg Oral Daily   Continuous Infusions: . azithromycin 500 mg (06/17/20 1733)  . cefTRIAXone (ROCEPHIN)  IV 2 g (06/17/20 1620)    Procedures/Studies: DG Chest 2 View  Result Date: 06/14/2020 CLINICAL DATA:  Shortness of breath and lower extremity edema. Leg swelling for 3 months but worsened today. History  of congestive heart failure. Negative COVID test. EXAM: CHEST - 2 VIEW COMPARISON:  chest x-ray 07/20/2011, 03/03/2020, 04/05/2020, 05/03/2020. FINDINGS: The heart size and mediastinal contours are unchanged. Interval development of retrocardiac airspace opacity as well as right lower lobe hazy airspace opacity. Similar-appearing increased interstitial markings with no overt pulmonary edema. Interval increase in size of bilateral trace to small volume pleural effusions. No pneumothorax. No acute osseous abnormality. IMPRESSION: 1. Interval increase in size of bilateral trace to small volume pleural effusions. 2. Right lower lobe and retrocardiac opacities could represent combination of atelectasis, infection, inflammation. Followup PA and lateral chest X-ray is recommended in 3-4 weeks following therapy to ensure resolution and exclude underlying malignancy. Electronically Signed   By: Iven Finn M.D.   On: 06/14/2020 15:56   DG Chest Port 1  View  Result Date: 06/15/2020 CLINICAL DATA:  CHF exacerbation. EXAM: PORTABLE CHEST 1 VIEW COMPARISON:  06/14/2020. FINDINGS: Cardiomegaly with pulmonary venous congestion. Bilateral pulmonary infiltrates/edema, right side greater than left. Bilateral pleural effusions, right side greater than left. These findings have progressed slightly from prior exam. Low lung volumes. No pneumothorax. IMPRESSION: Cardiomegaly with pulmonary venous congestion. Bilateral pulmonary infiltrates/edema, right side greater than left. Bilateral pleural effusions, right side greater than left. These findings have progressed slightly from prior exam. Electronically Signed   By: [place]   On: 06/15/2020 06:05    Orson Eva, DO  [facility]  If 7PM-7AM, please contact night-coverage [email or web address] Password [email or web address] 06/18/2020, 5:40 PM   LOS: 4 days

## 2020-06-19 DIAGNOSIS — N1832 Chronic kidney disease, stage 3b: Secondary | ICD-10-CM | POA: Diagnosis not present

## 2020-06-19 DIAGNOSIS — E669 Obesity, unspecified: Secondary | ICD-10-CM | POA: Diagnosis not present

## 2020-06-19 DIAGNOSIS — I5033 Acute on chronic diastolic (congestive) heart failure: Secondary | ICD-10-CM | POA: Diagnosis not present

## 2020-06-19 LAB — GLUCOSE, CAPILLARY
Glucose-Capillary: 103 mg/dL — ABNORMAL HIGH (ref 70–99)
Glucose-Capillary: 159 mg/dL — ABNORMAL HIGH (ref 70–99)
Glucose-Capillary: 156 mg/dL — ABNORMAL HIGH (ref 70–99)

## 2020-06-19 LAB — RENAL FUNCTION PANEL
Albumin: 3.6 g/dL (ref 3.5–5.0)
Anion gap: 10 (ref 5–15)
BUN: 47 mg/dL — ABNORMAL HIGH (ref 6–20)
CO2: 29 mmol/L (ref 22–32)
Calcium: 8.7 mg/dL — ABNORMAL LOW (ref 8.9–10.3)
Chloride: 99 mmol/L (ref 98–111)
Creatinine, Ser: 2.27 mg/dL — ABNORMAL HIGH (ref 0.61–1.24)
GFR, Estimated: 33 mL/min — ABNORMAL LOW (ref >60)
Glucose, Bld: 116 mg/dL — ABNORMAL HIGH (ref 70–99)
Phosphorus: 5.1 mg/dL — ABNORMAL HIGH (ref 2.5–4.6)
Potassium: 4.2 mmol/L (ref 3.5–5.1)
Sodium: 138 mmol/L (ref 135–145)

## 2020-06-19 LAB — MAGNESIUM: Magnesium: 2.1 mg/dL (ref 1.7–2.4)

## 2020-06-19 NOTE — Progress Notes (Signed)
PROGRESS NOTE  [name] [name]:[id number] DOB: [birth year] DOA: 06/14/2020 PCP: [name], MD   Brief History: 54 y.o.male,with history of hypertension, diabetes mellitus type 2, CHF-grade 2 diastolic, presents to the ER with a chief complaint of dyspnea. Patient reports it started today. He has had associated dizziness, fluid retention, and felt near syncopal. He had a cardiologist appointment, but when he got to the clinic he could not breathe and felt really dizzy, and they told him that he was there on the wrong day for his appointment so they wheeled him over here to the ER per his report. Patient reports he was here a month ago.He reports that he was in 100% back to normal at the time of discharge, but he was fine to manage at home until today. He reports he has been taking his diuretics every day. Patient reports that he has had increased orthopnea, and his fluid retention is in his legs and his stomach. Patient does not wear oxygen at home, but is requiring 2 L nasal cannula here in the ER. Patient reports that this fluid has been building up over the last 1 to 2 weeks. He has no associated chest pain, but he does mention occasional exertional palpitations. Patient has a cough that is nonproductive.He was admitted with marked volume overload and acute symptomatic diastolic heart failure. Notably, the patient was admitted from 05/03/20 to 05/07/20 due to acute on chronic diastolic CHF with discharge weight of 268. In addition he was admitted from 04/05/20 to 04/10/20 with acute on chronic diastolic CHF with d/c weight of 269. He was most recently d/ced home with lasix 60 mg po bid  Assessment/Plan: Acute on chronic diastolic CHF -continue IV lasix 60 mg bid -remains clinically fluid overloaded -04/06/20 Echo EF 50%, no WMA, trivial MR, +diastolic -I/Os incomplete-neg 4.4L -reports previous baseline weight 250-260 -weight 272.9 on 11/26>>269.7 -daily  weights  Uncontrolled DM2 with hyperglycemia -04/06/20 A1C--9.5 -continue novolog sliding scale -CBGs controlled this admission -d/c Actos -holding metformin -repeat A1C  ?pneumonia -check PCT<0.10 -discontinue ceftriaxone and azithroand monitor clinically  HTN -continue amlodipine, hydralazine, labetalol -anticipate improvement with diuresis  CKD 3b -baseline creatinine 1.8-2.1 -monitor with diuresis  HLD -continue pravastatin -sept 2021 LDL 87  Nocturnal Desaturations -He did have a sleep study in 2014 which demonstrated OSA but was intolerant to CPAP at that time. Would benefit from a repeat sleep study as an outpatient.  Morbid Obesity -BMI 40.59 -lifestyle modification     Status is: Inpatient  Remains inpatient appropriate because:IV treatments appropriate due to intensity of illness or inability to take PO   Dispo: The patient is from:Home Anticipated d/c is YK:DXIP Anticipated d/c date is: 2 days Patient currently is not medically stable to d/c.        Family Communication:spouse updated 11/27  Consultants:none  Code Status: FULL   DVT Prophylaxis: [place] Heparin  Procedures: As Listed in Progress Note Above  Antibiotics: Ceftriaxone 11/22>>11/26 azithro11/22>>11/25    Subjective: Patient denies fevers, chills, headache, chest pain, dyspnea, nausea, vomiting, diarrhea, abdominal pain, dysuria, hematuria, hematochezia, and melena.   Objective: Vitals:   06/19/20 0611 06/19/20 0916 06/19/20 1447 06/19/20 1727  BP: (!) 158/86 (!) 130/58 139/75 136/72  Pulse: 87 88 85   Resp: 19 18 18    Temp: 99.2 F (37.3 C)  99 F (37.2 C)   TempSrc:      SpO2: 96% 93% 94%   Weight:  Height:        Intake/Output Summary (Last 24 hours) at 06/19/2020 1728 Last data filed at 06/19/2020 1500 Gross per 24 hour  Intake 480 ml  Output --  Net 480 ml   Weight  change: -1.775 kg Exam:   General:  Pt is alert, follows commands appropriately, not in acute distress  HEENT: No icterus, No thrush, No neck mass, [place]/AT  Cardiovascular: RRR, S1/S2, no rubs, no gallops  Respiratory: bibasilar crackles. No wheeze  Abdomen: Soft/+BS, non tender, non distended, no guarding  Extremities: 1+LE  edema, No lymphangitis, No petechiae, No rashes, no synovitis   Data Reviewed: I have personally reviewed following labs and imaging studies Basic Metabolic Panel: Recent Labs  Lab 06/15/20 0552 06/16/20 0823 06/17/20 0602 06/18/20 0818 06/19/20 0842  NA 142 141 139 136 138  K 4.3 4.3 4.3 4.2 4.2  CL 107 106 103 99 99  CO2 27 27 26 28 29   GLUCOSE 81 84 90 98 116*  BUN 43* 41* 41* 44* 47*  CREATININE 2.10* 2.13* 2.16* 2.26* 2.27*  CALCIUM 8.4* 8.5* 8.5* 8.4* 8.7*  MG 2.0 2.1 1.9 2.1 2.1  PHOS  --  5.1* 4.6 4.8* 5.1*   Liver Function Tests: Recent Labs  Lab 06/14/20 1520 06/14/20 1520 06/15/20 0552 06/16/20 0823 06/17/20 0602 06/18/20 0818 06/19/20 0842  AST 20  --  14*  --   --   --   --   ALT 29  --  24  --   --   --   --   ALKPHOS 110  --  104  --   --   --   --   BILITOT 0.4  --  0.5  --   --   --   --   PROT 6.4*  --  6.2*  --   --   --   --   ALBUMIN 3.4*   < > 3.2* 3.2* 3.4* 3.3* 3.6   < > = values in this interval not displayed.   No results for input(s): LIPASE, AMYLASE in the last 168 hours. No results for input(s): AMMONIA in the last 168 hours. Coagulation Profile: No results for input(s): INR, PROTIME in the last 168 hours. CBC: Recent Labs  Lab 06/14/20 1520 06/16/20 0823  WBC 5.0 4.7  NEUTROABS 3.2 2.6  HGB 9.7* 9.6*  HCT 30.2* 30.0*  MCV 82.7 83.3  PLT 272 287   Cardiac Enzymes: No results for input(s): CKTOTAL, CKMB, CKMBINDEX, TROPONINI in the last 168 hours. BNP: Invalid input(s): POCBNP CBG: Recent Labs  Lab 06/18/20 1128 06/18/20 1711 06/19/20 0726 06/19/20 1117 06/19/20 1615  GLUCAP 143* 161*  103* 159* 156*   HbA1C: No results for input(s): HGBA1C in the last 72 hours. Urine analysis:    Component Value Date/Time   COLORURINE YELLOW 06/14/2020 [place] 06/14/2020 1452   LABSPEC 1.010 06/14/2020 1452   PHURINE 5.0 06/14/2020 1452   GLUCOSEU NEGATIVE 06/14/2020 1452   HGBUR NEGATIVE 06/14/2020 1452   BILIRUBINUR NEGATIVE 06/14/2020 1452   KETONESUR NEGATIVE 06/14/2020 1452   PROTEINUR 100 (A) 06/14/2020 1452   NITRITE NEGATIVE 06/14/2020 1452   LEUKOCYTESUR NEGATIVE 06/14/2020 1452   Sepsis Labs: @LABRCNTIP (procalcitonin:4,lacticidven:4) ) Recent Results (from the past 240 hour(s))  Resp Panel by RT-PCR (Flu A&B, Covid) Nasopharyngeal Swab     Status: None   Collection Time: 06/14/20  2:29 PM   Specimen: Nasopharyngeal Swab; Nasopharyngeal(NP) swabs in vial transport medium  Result Value  Ref Range Status   SARS Coronavirus 2 by RT PCR NEGATIVE NEGATIVE Final    Comment: (NOTE) SARS-CoV-2 target nucleic acids are NOT DETECTED.  The SARS-CoV-2 RNA is generally detectable in upper respiratory specimens during the acute phase of infection. The lowest concentration of SARS-CoV-2 viral copies this assay can detect is 138 copies/mL. A negative result does not preclude SARS-Cov-2 infection and should not be used as the sole basis for treatment or other patient management decisions. A negative result may occur with  improper specimen collection/handling, submission of specimen other than nasopharyngeal swab, presence of viral mutation(s) within the areas targeted by this assay, and inadequate number of viral copies(<138 copies/mL). A negative result must be combined with clinical observations, patient history, and epidemiological information. The expected result is Negative.  Fact Sheet for Patients:  EntrepreneurPulse.com.au  Fact Sheet for Healthcare Providers:  IncredibleEmployment.be  This test is no t yet  approved or cleared by the [place] FDA and  has been authorized for detection and/or diagnosis of SARS-CoV-2 by FDA under an Emergency Use Authorization (EUA). This EUA will remain  in effect (meaning this test can be used) for the duration of the COVID-19 declaration under Section 564(b)(1) of the Act, 21 U.S.C.section 360bbb-3(b)(1), unless the authorization is terminated  or revoked sooner.       Influenza A by PCR NEGATIVE NEGATIVE Final   Influenza B by PCR NEGATIVE NEGATIVE Final    Comment: (NOTE) The Xpert Xpress SARS-CoV-2/FLU/RSV plus assay is intended as an aid in the diagnosis of influenza from Nasopharyngeal swab specimens and should not be used as a sole basis for treatment. Nasal washings and aspirates are unacceptable for Xpert Xpress SARS-CoV-2/FLU/RSV testing.  Fact Sheet for Patients: EntrepreneurPulse.com.au  Fact Sheet for Healthcare Providers: IncredibleEmployment.be  This test is not yet approved or cleared by the [place] FDA and has been authorized for detection and/or diagnosis of SARS-CoV-2 by FDA under an Emergency Use Authorization (EUA). This EUA will remain in effect (meaning this test can be used) for the duration of the COVID-19 declaration under Section 564(b)(1) of the Act, 21 U.S.C. section 360bbb-3(b)(1), unless the authorization is terminated or revoked.  Performed at [facility], [address], [place], [place] [address]      Scheduled Meds: . amLODipine  10 mg Oral Daily  . furosemide  60 mg Intravenous Q12H  . heparin  5,000 Units Subcutaneous Q8H  . hydrALAZINE  100 mg Oral TID  . labetalol  200 mg Oral BID  . multivitamin with minerals  1 tablet Oral Daily  . pravastatin  40 mg Oral Daily   Continuous Infusions:  Procedures/Studies: DG Chest 2 View  Result Date: 06/14/2020 CLINICAL DATA:  Shortness of breath and lower extremity edema. Leg swelling for 3 months but worsened  today. History of congestive heart failure. Negative COVID test. EXAM: CHEST - 2 VIEW COMPARISON:  chest x-ray 07/20/2011, 03/03/2020, 04/05/2020, 05/03/2020. FINDINGS: The heart size and mediastinal contours are unchanged. Interval development of retrocardiac airspace opacity as well as right lower lobe hazy airspace opacity. Similar-appearing increased interstitial markings with no overt pulmonary edema. Interval increase in size of bilateral trace to small volume pleural effusions. No pneumothorax. No acute osseous abnormality. IMPRESSION: 1. Interval increase in size of bilateral trace to small volume pleural effusions. 2. Right lower lobe and retrocardiac opacities could represent combination of atelectasis, infection, inflammation. Followup PA and lateral chest X-ray is recommended in 3-4 weeks following therapy to ensure resolution and exclude  underlying malignancy. Electronically Signed   By: Iven Finn M.D.   On: 06/14/2020 15:56   DG Chest Port 1 View  Result Date: 06/15/2020 CLINICAL DATA:  CHF exacerbation. EXAM: PORTABLE CHEST 1 VIEW COMPARISON:  06/14/2020. FINDINGS: Cardiomegaly with pulmonary venous congestion. Bilateral pulmonary infiltrates/edema, right side greater than left. Bilateral pleural effusions, right side greater than left. These findings have progressed slightly from prior exam. Low lung volumes. No pneumothorax. IMPRESSION: Cardiomegaly with pulmonary venous congestion. Bilateral pulmonary infiltrates/edema, right side greater than left. Bilateral pleural effusions, right side greater than left. These findings have progressed slightly from prior exam. Electronically Signed   By: [place]   On: 06/15/2020 06:05    Orson Eva, DO  [facility]  If 7PM-7AM, please contact night-coverage [email or web address] Password [email or web address] 06/19/2020, 5:28 PM   LOS: 5 days

## 2020-06-20 DIAGNOSIS — E669 Obesity, unspecified: Secondary | ICD-10-CM | POA: Diagnosis not present

## 2020-06-20 DIAGNOSIS — I5033 Acute on chronic diastolic (congestive) heart failure: Secondary | ICD-10-CM | POA: Diagnosis not present

## 2020-06-20 DIAGNOSIS — E1165 Type 2 diabetes mellitus with hyperglycemia: Secondary | ICD-10-CM | POA: Diagnosis not present

## 2020-06-20 DIAGNOSIS — I10 Essential (primary) hypertension: Secondary | ICD-10-CM | POA: Diagnosis not present

## 2020-06-20 LAB — HEMOGLOBIN A1C
Hgb A1c MFr Bld: 6.9 % — ABNORMAL HIGH (ref 4.8–5.6)
Mean Plasma Glucose: 151.33 mg/dL

## 2020-06-20 LAB — GLUCOSE, CAPILLARY: Glucose-Capillary: 164 mg/dL — ABNORMAL HIGH (ref 70–99)

## 2020-06-20 LAB — RENAL FUNCTION PANEL
Albumin: 3.4 g/dL — ABNORMAL LOW (ref 3.5–5.0)
Anion gap: 10 (ref 5–15)
BUN: 45 mg/dL — ABNORMAL HIGH (ref 6–20)
CO2: 31 mmol/L (ref 22–32)
Calcium: 8.8 mg/dL — ABNORMAL LOW (ref 8.9–10.3)
Chloride: 96 mmol/L — ABNORMAL LOW (ref 98–111)
Creatinine, Ser: 2.1 mg/dL — ABNORMAL HIGH (ref 0.61–1.24)
GFR, Estimated: 36 mL/min — ABNORMAL LOW (ref >60)
Glucose, Bld: 178 mg/dL — ABNORMAL HIGH (ref 70–99)
Phosphorus: 4.5 mg/dL (ref 2.5–4.6)
Potassium: 4.1 mmol/L (ref 3.5–5.1)
Sodium: 137 mmol/L (ref 135–145)

## 2020-06-20 LAB — MAGNESIUM: Magnesium: 2 mg/dL (ref 1.7–2.4)

## 2020-06-20 MED ORDER — TORSEMIDE 20 MG PO TABS
60.0000 mg | ORAL_TABLET | Freq: Two times a day (BID) | ORAL | Status: DC
  Administered 2020-06-20 – 2020-06-21 (×2): 60 mg via ORAL
  Filled 2020-06-20 (×2): qty 3

## 2020-06-20 NOTE — Discharge Summary (Signed)
Physician Discharge Summary  [name] YFV:[id number] DOB: Jun 04, 1966 DOA: 06/14/2020  PCP: [name], MD  Admit date: 06/14/2020 Discharge date: 06/21/2020  Admitted From: Home Disposition:  Home  Recommendations for Outpatient Follow-up:  1. Follow up with PCP in 1-2 weeks 2. Please obtain BMP/CBC in one week     Discharge Condition: Stable CODE STATUS: FULL Diet recommendation: Heart Healthy / Carb Modified   Brief/Interim Summary: 54 y.o.male,with history of hypertension, diabetes mellitus type 2, CHF-grade 2 diastolic, presents to the ER with a chief complaint of dyspnea. Patient reports it started today. He has had associated dizziness, fluid retention, and felt near syncopal. He had a cardiologist appointment, but when he got to the clinic he could not breathe and felt really dizzy, and they told him that he was there on the wrong day for his appointment so they wheeled him over here to the ER per his report. Patient reports he was here a month ago.He reports that he was in 100% back to normal at the time of discharge, but he was fine to manage at home until today. He reports he has been taking his diuretics every day. Patient reports that he has had increased orthopnea, and his fluid retention is in his legs and his stomach. Patient does not wear oxygen at home, but is requiring 2 L nasal cannula here in the ER. Patient reports that this fluid has been building up over the last 1 to 2 weeks. He has no associated chest pain, but he does mention occasional exertional palpitations. Patient has a cough that is nonproductive.He was admitted with marked volume overload and acute symptomatic diastolic heart failure. Notably, the patient was admitted from 05/03/20 to 05/07/20 due to acute on chronic diastolic CHF with discharge weight of 268. In addition he was admitted from 04/05/20 to 04/10/20 with acute on chronic diastolic CHF with d/c weight of 269. He was  most recently d/ced home with lasix 60 mg po bid   Discharge Diagnoses:  Acute on chronic diastolic CHF -continue IV lasix 60 mg bid>>torsemide 60 mg po bid -remains clinically fluid overloaded -04/06/20 Echo EF 50%, no WMA, trivial MR, +diastolic -I/Os incomplete-neg4.4L -reports previous baseline weight 250-260 -weight 272.9>>268.7>>264>>260.9 (day of d/c) -daily weights -pt felt a little dizziness on 11/29 am-->decrease torsemide to 40 mg bid -d/c home with torsemide 40 mg bid  Uncontrolled DM2 with hyperglycemia -04/06/20 A1C--9.5 -continue novolog sliding scale -CBGs controlled this admission -d/c Actos -holding metformin--restart after d/c -11/28  A1C--6.9  ?pneumonia -check PCT<0.10 -discontinue ceftriaxone and azithroand monitor clinically  HTN -continue amlodipine, hydralazine, labetalol -anticipate improvement with diuresis  CKD 3b -baseline creatinine 1.8-2.1 -monitor with diuresis  HLD -continue pravastatin -sept 2021 LDL 87  Nocturnal Desaturations -He did have a sleep study in 2014 which demonstrated OSA but was intolerant to CPAP at that time. Would benefit from a repeat sleep study as an outpatient.  Morbid Obesity -BMI 40.59 -lifestyle modification    Discharge Instructions   Allergies as of 06/21/2020   No Known Allergies     Medication List    STOP taking these medications   furosemide 40 MG tablet Commonly known as: Lasix   pioglitazone 30 MG tablet Commonly known as: ACTOS     TAKE these medications   amLODipine 10 MG tablet Commonly known as: NORVASC Take 1 tablet (10 mg total) by mouth daily.   glimepiride 4 MG tablet Commonly known as: Amaryl Take 1 tablet (4 mg total) by mouth daily  before breakfast. What changed: when to take this   hydrALAZINE 100 MG tablet Commonly known as: APRESOLINE Take 1 tablet (100 mg total) by mouth 3 (three) times daily.   labetalol 200 MG tablet Commonly known as:  NORMODYNE Take 1 tablet (200 mg total) by mouth 2 (two) times daily.   metFORMIN 500 MG 24 hr tablet Commonly known as: GLUCOPHAGE-XR Take 2 tablets (1,000 mg total) by mouth in the morning and at bedtime.   multivitamin with minerals Tabs tablet Take 1 tablet by mouth daily.   pravastatin 40 MG tablet Commonly known as: PRAVACHOL Take 40 mg by mouth in the morning.   torsemide 20 MG tablet Commonly known as: DEMADEX Take 2 tablets (40 mg total) by mouth 2 (two) times daily. Start taking on: June 22, 2020       No Known Allergies  Consultations:  none   Procedures/Studies: DG Chest 2 View  Result Date: 06/14/2020 CLINICAL DATA:  Shortness of breath and lower extremity edema. Leg swelling for 3 months but worsened today. History of congestive heart failure. Negative COVID test. EXAM: CHEST - 2 VIEW COMPARISON:  chest x-ray 07/20/2011, 03/03/2020, 04/05/2020, 05/03/2020. FINDINGS: The heart size and mediastinal contours are unchanged. Interval development of retrocardiac airspace opacity as well as right lower lobe hazy airspace opacity. Similar-appearing increased interstitial markings with no overt pulmonary edema. Interval increase in size of bilateral trace to small volume pleural effusions. No pneumothorax. No acute osseous abnormality. IMPRESSION: 1. Interval increase in size of bilateral trace to small volume pleural effusions. 2. Right lower lobe and retrocardiac opacities could represent combination of atelectasis, infection, inflammation. Followup PA and lateral chest X-ray is recommended in 3-4 weeks following therapy to ensure resolution and exclude underlying malignancy. Electronically Signed   By: Iven Finn M.D.   On: 06/14/2020 15:56   DG Chest Port 1 View  Result Date: 06/15/2020 CLINICAL DATA:  CHF exacerbation. EXAM: PORTABLE CHEST 1 VIEW COMPARISON:  06/14/2020. FINDINGS: Cardiomegaly with pulmonary venous congestion. Bilateral pulmonary  infiltrates/edema, right side greater than left. Bilateral pleural effusions, right side greater than left. These findings have progressed slightly from prior exam. Low lung volumes. No pneumothorax. IMPRESSION: Cardiomegaly with pulmonary venous congestion. Bilateral pulmonary infiltrates/edema, right side greater than left. Bilateral pleural effusions, right side greater than left. These findings have progressed slightly from prior exam. Electronically Signed   By: [place]   On: 06/15/2020 06:05        Discharge Exam: Vitals:   06/21/20 0615 06/21/20 0828  BP: (!) 145/82 (!) 141/73  Pulse: 83 81  Resp: 20   Temp: 99.6 F (37.6 C)   SpO2: 95%    Vitals:   06/20/20 1537 06/20/20 2116 06/21/20 0615 06/21/20 0828  BP: 131/76 (!) 141/80 (!) 145/82 (!) 141/73  Pulse:  80 83 81  Resp:  18 20   Temp:  98.8 F (37.1 C) 99.6 F (37.6 C)   TempSrc:  Oral    SpO2:  97% 95%   Weight:      Height:        General: Pt is alert, awake, not in acute distress Cardiovascular: RRR, S1/S2 +, no rubs, no gallops Respiratory: bibasilar crackles. No wheeze Abdominal: Soft, NT, ND, bowel sounds + Extremities: 1+LE edema, no cyanosis   The results of significant diagnostics from this hospitalization (including imaging, microbiology, ancillary and laboratory) are listed below for reference.    Significant Diagnostic Studies: DG Chest 2 View  Result Date: 06/14/2020  CLINICAL DATA:  Shortness of breath and lower extremity edema. Leg swelling for 3 months but worsened today. History of congestive heart failure. Negative COVID test. EXAM: CHEST - 2 VIEW COMPARISON:  chest x-ray 07/20/2011, 03/03/2020, 04/05/2020, 05/03/2020. FINDINGS: The heart size and mediastinal contours are unchanged. Interval development of retrocardiac airspace opacity as well as right lower lobe hazy airspace opacity. Similar-appearing increased interstitial markings with no overt pulmonary edema. Interval increase in  size of bilateral trace to small volume pleural effusions. No pneumothorax. No acute osseous abnormality. IMPRESSION: 1. Interval increase in size of bilateral trace to small volume pleural effusions. 2. Right lower lobe and retrocardiac opacities could represent combination of atelectasis, infection, inflammation. Followup PA and lateral chest X-ray is recommended in 3-4 weeks following therapy to ensure resolution and exclude underlying malignancy. Electronically Signed   By: Iven Finn M.D.   On: 06/14/2020 15:56   DG Chest Port 1 View  Result Date: 06/15/2020 CLINICAL DATA:  CHF exacerbation. EXAM: PORTABLE CHEST 1 VIEW COMPARISON:  06/14/2020. FINDINGS: Cardiomegaly with pulmonary venous congestion. Bilateral pulmonary infiltrates/edema, right side greater than left. Bilateral pleural effusions, right side greater than left. These findings have progressed slightly from prior exam. Low lung volumes. No pneumothorax. IMPRESSION: Cardiomegaly with pulmonary venous congestion. Bilateral pulmonary infiltrates/edema, right side greater than left. Bilateral pleural effusions, right side greater than left. These findings have progressed slightly from prior exam. Electronically Signed   By: Marcello Moores  Register   On: 06/15/2020 06:05     Microbiology: Recent Results (from the past 240 hour(s))  Resp Panel by RT-PCR (Flu A&B, Covid) Nasopharyngeal Swab     Status: None   Collection Time: 06/14/20  2:29 PM   Specimen: Nasopharyngeal Swab; Nasopharyngeal(NP) swabs in vial transport medium  Result Value Ref Range Status   SARS Coronavirus 2 by RT PCR NEGATIVE NEGATIVE Final    Comment: (NOTE) SARS-CoV-2 target nucleic acids are NOT DETECTED.  The SARS-CoV-2 RNA is generally detectable in upper respiratory specimens during the acute phase of infection. The lowest concentration of SARS-CoV-2 viral copies this assay can detect is 138 copies/mL. A negative result does not preclude SARS-Cov-2 infection  and should not be used as the sole basis for treatment or other patient management decisions. A negative result may occur with  improper specimen collection/handling, submission of specimen other than nasopharyngeal swab, presence of viral mutation(s) within the areas targeted by this assay, and inadequate number of viral copies(<138 copies/mL). A negative result must be combined with clinical observations, patient history, and epidemiological information. The expected result is Negative.  Fact Sheet for Patients:  EntrepreneurPulse.com.au  Fact Sheet for Healthcare Providers:  IncredibleEmployment.be  This test is no t yet approved or cleared by the [place] FDA and  has been authorized for detection and/or diagnosis of SARS-CoV-2 by FDA under an Emergency Use Authorization (EUA). This EUA will remain  in effect (meaning this test can be used) for the duration of the COVID-19 declaration under Section 564(b)(1) of the Act, 21 U.S.C.section 360bbb-3(b)(1), unless the authorization is terminated  or revoked sooner.       Influenza A by PCR NEGATIVE NEGATIVE Final   Influenza B by PCR NEGATIVE NEGATIVE Final    Comment: (NOTE) The Xpert Xpress SARS-CoV-2/FLU/RSV plus assay is intended as an aid in the diagnosis of influenza from Nasopharyngeal swab specimens and should not be used as a sole basis for treatment. Nasal washings and aspirates are unacceptable for Xpert Xpress SARS-CoV-2/FLU/RSV testing.  Fact Sheet  for Patients: EntrepreneurPulse.com.au  Fact Sheet for Healthcare Providers: IncredibleEmployment.be  This test is not yet approved or cleared by the [place] FDA and has been authorized for detection and/or diagnosis of SARS-CoV-2 by FDA under an Emergency Use Authorization (EUA). This EUA will remain in effect (meaning this test can be used) for the duration of the COVID-19 declaration  under Section 564(b)(1) of the Act, 21 U.S.C. section 360bbb-3(b)(1), unless the authorization is terminated or revoked.  Performed at [facility], [address], [place],  [address]      Labs: Basic Metabolic Panel: Recent Labs  Lab 06/16/20 [phone] 06/16/20 0823 06/17/20 0602 06/17/20 0602 06/18/20 0818 06/18/20 0818 06/19/20 0842 06/20/20 1005  NA 141  --  139  --  136  --  138 137  K 4.3   < > 4.3   < > 4.2   < > 4.2 4.1  CL 106  --  103  --  99  --  99 96*  CO2 27  --  26  --  28  --  29 31  GLUCOSE 84  --  90  --  98  --  116* 178*  BUN 41*  --  41*  --  44*  --  47* 45*  CREATININE 2.13*  --  2.16*  --  2.26*  --  2.27* 2.10*  CALCIUM 8.5*  --  8.5*  --  8.4*  --  8.7* 8.8*  MG 2.1  --  1.9  --  2.1  --  2.1 2.0  PHOS 5.1*  --  4.6  --  4.8*  --  5.1* 4.5   < > = values in this interval not displayed.   Liver Function Tests: Recent Labs  Lab 06/14/20 1520 06/14/20 1520 06/15/20 0552 06/15/20 0552 06/16/20 0823 06/17/20 0602 06/18/20 0818 06/19/20 0842 06/20/20 1005  AST 20  --  14*  --   --   --   --   --   --   ALT 29  --  24  --   --   --   --   --   --   ALKPHOS 110  --  104  --   --   --   --   --   --   BILITOT 0.4  --  0.5  --   --   --   --   --   --   PROT 6.4*  --  6.2*  --   --   --   --   --   --   ALBUMIN 3.4*   < > 3.2*   < > 3.2* 3.4* 3.3* 3.6 3.4*   < > = values in this interval not displayed.   No results for input(s): LIPASE, AMYLASE in the last 168 hours. No results for input(s): AMMONIA in the last 168 hours. CBC: Recent Labs  Lab 06/14/20 1520 06/16/20 0823  WBC 5.0 4.7  NEUTROABS 3.2 2.6  HGB 9.7* 9.6*  HCT 30.2* 30.0*  MCV 82.7 83.3  PLT 272 287   Cardiac Enzymes: No results for input(s): CKTOTAL, CKMB, CKMBINDEX, TROPONINI in the last 168 hours. BNP: Invalid input(s): POCBNP CBG: Recent Labs  Lab 06/20/20 1121 06/20/20 1706 06/21/20 0610 06/21/20 0822 06/21/20 1114  GLUCAP 206* 164* 151* 129* 166*     Time coordinating discharge:  36 minutes  Signed:  Orson Eva, DO [facility] Pager: [phone] 06/21/2020, 11:18 AM

## 2020-06-20 NOTE — Progress Notes (Signed)
PROGRESS NOTE  [name] VXB:[id number] DOB: [birth year] DOA: 06/14/2020 PCP: [name], MD   Brief History: 54 y.o.male,with history of hypertension, diabetes mellitus type 2, CHF-grade 2 diastolic, presents to the ER with a chief complaint of dyspnea. Patient reports it started today. He has had associated dizziness, fluid retention, and felt near syncopal. He had a cardiologist appointment, but when he got to the clinic he could not breathe and felt really dizzy, and they told him that he was there on the wrong day for his appointment so they wheeled him over here to the ER per his report. Patient reports he was here a month ago.He reports that he was in 100% back to normal at the time of discharge, but he was fine to manage at home until today. He reports he has been taking his diuretics every day. Patient reports that he has had increased orthopnea, and his fluid retention is in his legs and his stomach. Patient does not wear oxygen at home, but is requiring 2 L nasal cannula here in the ER. Patient reports that this fluid has been building up over the last 1 to 2 weeks. He has no associated chest pain, but he does mention occasional exertional palpitations. Patient has a cough that is nonproductive.He was admitted with marked volume overload and acute symptomatic diastolic heart failure. Notably, the patient was admitted from 05/03/20 to 05/07/20 due to acute on chronic diastolic CHF with discharge weight of 268. In addition he was admitted from 04/05/20 to 04/10/20 with acute on chronic diastolic CHF with d/c weight of 269. He was most recently d/ced home with lasix 60 mg po bid  Assessment/Plan: Acute on chronic diastolic CHF -continue IV lasix 60 mg bid>>torsemide 60 mg po bid -remains clinically fluid overloaded -04/06/20 Echo EF 50%, no WMA, trivial MR, +diastolic -I/Os incomplete-neg4.4L -reports previous baseline weight 250-260 -weight 272.9 on  11/26>>268.7>>264 -daily weights  Uncontrolled DM2 with hyperglycemia -04/06/20 A1C--9.5 -continue novolog sliding scale -CBGs controlled this admission -d/c Actos -holding metformin -repeat A1C  ?pneumonia -check PCT<0.10 -discontinue ceftriaxone and azithroand monitor clinically  HTN -continue amlodipine, hydralazine, labetalol -anticipate improvement with diuresis  CKD 3b -baseline creatinine 1.8-2.1 -monitor with diuresis  HLD -continue pravastatin -sept 2021 LDL 87  Nocturnal Desaturations -He did have a sleep study in 2014 which demonstrated OSA but was intolerant to CPAP at that time. Would benefit from a repeat sleep study as an outpatient.  Morbid Obesity -BMI 40.59 -lifestyle modification     Status is: Inpatient  Remains inpatient appropriate because:IV treatments appropriate due to intensity of illness or inability to take PO   Dispo: The patient is from:Home Anticipated d/c is ZR:AQTM Anticipated d/c date is: 11/29 Patient currently is not medically stable to d/c.        Family Communication:spouse updated 11/27  Consultants:none  Code Status: FULL   DVT Prophylaxis: [employer] Heparin  Procedures: As Listed in Progress Note Above  Antibiotics: Ceftriaxone 11/22>>11/26 azithro11/22>>11/25     Subjective: Patient denies fevers, chills, headache, chest pain, dyspnea, nausea, vomiting, diarrhea, abdominal pain, dysuria, hematuria, hematochezia, and melena.   Objective: Vitals:   06/19/20 1727 06/19/20 2130 06/20/20 0543 06/20/20 0547  BP: 136/72 (!) 156/86 137/77   Pulse:  86 87   Resp:  16 16   Temp:  98.6 F (37 C) 98.8 F (37.1 C)   TempSrc:      SpO2:  91% 94%   Weight:  119.7 kg  Height:        Intake/Output Summary (Last 24 hours) at 06/20/2020 1111 Last data filed at 06/19/2020 2100 Gross per 24 hour  Intake 480 ml  Output 2600 ml  Net  -2120 ml   Weight change: -3.396 kg Exam:   General:  Pt is alert, follows commands appropriately, not in acute distress  HEENT: No icterus, No thrush, No neck mass, [place]/AT  Cardiovascular: RRR, S1/S2, no rubs, no gallops  Respiratory: bibasilar crackles. No wheeze  Abdomen: Soft/+BS, non tender, non distended, no guarding  Extremities: 1+ LEedema, No lymphangitis, No petechiae, No rashes, no synovitis   Data Reviewed: I have personally reviewed following labs and imaging studies Basic Metabolic Panel: Recent Labs  Lab 06/15/20 0552 06/16/20 0823 06/17/20 0602 06/18/20 0818 06/19/20 0842  NA 142 141 139 136 138  K 4.3 4.3 4.3 4.2 4.2  CL 107 106 103 99 99  CO2 27 27 26 28 29   GLUCOSE 81 84 90 98 116*  BUN 43* 41* 41* 44* 47*  CREATININE 2.10* 2.13* 2.16* 2.26* 2.27*  CALCIUM 8.4* 8.5* 8.5* 8.4* 8.7*  MG 2.0 2.1 1.9 2.1 2.1  PHOS  --  5.1* 4.6 4.8* 5.1*   Liver Function Tests: Recent Labs  Lab 06/14/20 1520 06/14/20 1520 06/15/20 0552 06/16/20 0823 06/17/20 0602 06/18/20 0818 06/19/20 0842  AST 20  --  14*  --   --   --   --   ALT 29  --  24  --   --   --   --   ALKPHOS 110  --  104  --   --   --   --   BILITOT 0.4  --  0.5  --   --   --   --   PROT 6.4*  --  6.2*  --   --   --   --   ALBUMIN 3.4*   < > 3.2* 3.2* 3.4* 3.3* 3.6   < > = values in this interval not displayed.   No results for input(s): LIPASE, AMYLASE in the last 168 hours. No results for input(s): AMMONIA in the last 168 hours. Coagulation Profile: No results for input(s): INR, PROTIME in the last 168 hours. CBC: Recent Labs  Lab 06/14/20 1520 06/16/20 0823  WBC 5.0 4.7  NEUTROABS 3.2 2.6  HGB 9.7* 9.6*  HCT 30.2* 30.0*  MCV 82.7 83.3  PLT 272 287   Cardiac Enzymes: No results for input(s): CKTOTAL, CKMB, CKMBINDEX, TROPONINI in the last 168 hours. BNP: Invalid input(s): POCBNP CBG: Recent Labs  Lab 06/18/20 1128 06/18/20 1711 06/19/20 0726 06/19/20 1117 06/19/20 1615    GLUCAP 143* 161* 103* 159* 156*   HbA1C: No results for input(s): HGBA1C in the last 72 hours. Urine analysis:    Component Value Date/Time   COLORURINE YELLOW 06/14/2020 [place] 06/14/2020 1452   LABSPEC 1.010 06/14/2020 1452   PHURINE 5.0 06/14/2020 1452   GLUCOSEU NEGATIVE 06/14/2020 1452   HGBUR NEGATIVE 06/14/2020 1452   BILIRUBINUR NEGATIVE 06/14/2020 1452   KETONESUR NEGATIVE 06/14/2020 1452   PROTEINUR 100 (A) 06/14/2020 1452   NITRITE NEGATIVE 06/14/2020 1452   LEUKOCYTESUR NEGATIVE 06/14/2020 1452   Sepsis Labs: @LABRCNTIP (procalcitonin:4,lacticidven:4) ) Recent Results (from the past 240 hour(s))  Resp Panel by RT-PCR (Flu A&B, Covid) Nasopharyngeal Swab     Status: None   Collection Time: 06/14/20  2:29 PM   Specimen: Nasopharyngeal Swab; Nasopharyngeal(NP) swabs in vial transport  medium  Result Value Ref Range Status   SARS Coronavirus 2 by RT PCR NEGATIVE NEGATIVE Final    Comment: (NOTE) SARS-CoV-2 target nucleic acids are NOT DETECTED.  The SARS-CoV-2 RNA is generally detectable in upper respiratory specimens during the acute phase of infection. The lowest concentration of SARS-CoV-2 viral copies this assay can detect is 138 copies/mL. A negative result does not preclude SARS-Cov-2 infection and should not be used as the sole basis for treatment or other patient management decisions. A negative result may occur with  improper specimen collection/handling, submission of specimen other than nasopharyngeal swab, presence of viral mutation(s) within the areas targeted by this assay, and inadequate number of viral copies(<138 copies/mL). A negative result must be combined with clinical observations, patient history, and epidemiological information. The expected result is Negative.  Fact Sheet for Patients:  EntrepreneurPulse.com.au  Fact Sheet for Healthcare Providers:  IncredibleEmployment.be  This  test is no t yet approved or cleared by the [place] FDA and  has been authorized for detection and/or diagnosis of SARS-CoV-2 by FDA under an Emergency Use Authorization (EUA). This EUA will remain  in effect (meaning this test can be used) for the duration of the COVID-19 declaration under Section 564(b)(1) of the Act, 21 U.S.C.section 360bbb-3(b)(1), unless the authorization is terminated  or revoked sooner.       Influenza A by PCR NEGATIVE NEGATIVE Final   Influenza B by PCR NEGATIVE NEGATIVE Final    Comment: (NOTE) The Xpert Xpress SARS-CoV-2/FLU/RSV plus assay is intended as an aid in the diagnosis of influenza from Nasopharyngeal swab specimens and should not be used as a sole basis for treatment. Nasal washings and aspirates are unacceptable for Xpert Xpress SARS-CoV-2/FLU/RSV testing.  Fact Sheet for Patients: EntrepreneurPulse.com.au  Fact Sheet for Healthcare Providers: IncredibleEmployment.be  This test is not yet approved or cleared by the [place] FDA and has been authorized for detection and/or diagnosis of SARS-CoV-2 by FDA under an Emergency Use Authorization (EUA). This EUA will remain in effect (meaning this test can be used) for the duration of the COVID-19 declaration under Section 564(b)(1) of the Act, 21 U.S.C. section 360bbb-3(b)(1), unless the authorization is terminated or revoked.  Performed at [facility], [address], [place], [place] [address]      Scheduled Meds: . amLODipine  10 mg Oral Daily  . heparin  5,000 Units Subcutaneous Q8H  . hydrALAZINE  100 mg Oral TID  . labetalol  200 mg Oral BID  . multivitamin with minerals  1 tablet Oral Daily  . pravastatin  40 mg Oral Daily  . torsemide  60 mg Oral BID   Continuous Infusions:  Procedures/Studies: DG Chest 2 View  Result Date: 06/14/2020 CLINICAL DATA:  Shortness of breath and lower extremity edema. Leg swelling for 3 months but  worsened today. History of congestive heart failure. Negative COVID test. EXAM: CHEST - 2 VIEW COMPARISON:  chest x-ray 07/20/2011, 03/03/2020, 04/05/2020, 05/03/2020. FINDINGS: The heart size and mediastinal contours are unchanged. Interval development of retrocardiac airspace opacity as well as right lower lobe hazy airspace opacity. Similar-appearing increased interstitial markings with no overt pulmonary edema. Interval increase in size of bilateral trace to small volume pleural effusions. No pneumothorax. No acute osseous abnormality. IMPRESSION: 1. Interval increase in size of bilateral trace to small volume pleural effusions. 2. Right lower lobe and retrocardiac opacities could represent combination of atelectasis, infection, inflammation. Followup PA and lateral chest X-ray is recommended in 3-4 weeks following therapy to  ensure resolution and exclude underlying malignancy. Electronically Signed   By: Iven Finn M.D.   On: 06/14/2020 15:56   DG Chest Port 1 View  Result Date: 06/15/2020 CLINICAL DATA:  CHF exacerbation. EXAM: PORTABLE CHEST 1 VIEW COMPARISON:  06/14/2020. FINDINGS: Cardiomegaly with pulmonary venous congestion. Bilateral pulmonary infiltrates/edema, right side greater than left. Bilateral pleural effusions, right side greater than left. These findings have progressed slightly from prior exam. Low lung volumes. No pneumothorax. IMPRESSION: Cardiomegaly with pulmonary venous congestion. Bilateral pulmonary infiltrates/edema, right side greater than left. Bilateral pleural effusions, right side greater than left. These findings have progressed slightly from prior exam. Electronically Signed   By: [place]   On: 06/15/2020 06:05    Orson Eva, DO  [facility]  If 7PM-7AM, please contact night-coverage [email or web address] Password [email or web address] 06/20/2020, 11:11 AM   LOS: 6 days

## 2020-06-21 DIAGNOSIS — I5033 Acute on chronic diastolic (congestive) heart failure: Secondary | ICD-10-CM | POA: Diagnosis not present

## 2020-06-21 DIAGNOSIS — N1832 Chronic kidney disease, stage 3b: Secondary | ICD-10-CM | POA: Diagnosis not present

## 2020-06-21 DIAGNOSIS — E669 Obesity, unspecified: Secondary | ICD-10-CM | POA: Diagnosis not present

## 2020-06-21 LAB — GLUCOSE, CAPILLARY
Glucose-Capillary: 137 mg/dL — ABNORMAL HIGH (ref 70–99)
Glucose-Capillary: 78 mg/dL (ref 70–99)
Glucose-Capillary: 179 mg/dL — ABNORMAL HIGH (ref 70–99)
Glucose-Capillary: 119 mg/dL — ABNORMAL HIGH (ref 70–99)
Glucose-Capillary: 106 mg/dL — ABNORMAL HIGH (ref 70–99)
Glucose-Capillary: 206 mg/dL — ABNORMAL HIGH (ref 70–99)
Glucose-Capillary: 151 mg/dL — ABNORMAL HIGH (ref 70–99)
Glucose-Capillary: 129 mg/dL — ABNORMAL HIGH (ref 70–99)
Glucose-Capillary: 166 mg/dL — ABNORMAL HIGH (ref 70–99)

## 2020-06-21 LAB — BASIC METABOLIC PANEL
Anion gap: 10 (ref 5–15)
BUN: 48 mg/dL — ABNORMAL HIGH (ref 6–20)
CO2: 31 mmol/L (ref 22–32)
Calcium: 8.6 mg/dL — ABNORMAL LOW (ref 8.9–10.3)
Chloride: 96 mmol/L — ABNORMAL LOW (ref 98–111)
Creatinine, Ser: 2.43 mg/dL — ABNORMAL HIGH (ref 0.61–1.24)
GFR, Estimated: 31 mL/min — ABNORMAL LOW (ref >60)
Glucose, Bld: 166 mg/dL — ABNORMAL HIGH (ref 70–99)
Potassium: 4.3 mmol/L (ref 3.5–5.1)
Sodium: 137 mmol/L (ref 135–145)

## 2020-06-21 LAB — MAGNESIUM: Magnesium: 2.2 mg/dL (ref 1.7–2.4)

## 2020-06-21 MED ORDER — TORSEMIDE 20 MG PO TABS: 40.0000 mg | ORAL_TABLET | Freq: Two times a day (BID) | ORAL | Status: DC

## 2020-06-21 MED ORDER — TORSEMIDE 20 MG PO TABS
40.0000 mg | ORAL_TABLET | Freq: Two times a day (BID) | ORAL | 1 refills | Status: DC
Start: 2020-06-22 — End: 2020-10-01

## 2020-06-21 NOTE — Progress Notes (Signed)
Nsg Discharge Note  Admit Date:  06/14/2020 Discharge date: 06/21/2020   [name] to be D/C'd Home per MD order.  AVS completed.  Copy for chart, and copy for patient signed, and dated. Patient/caregiver able to verbalize understanding. Patient stable upon discharge. IV removed by swot nurse (tiffany osborne RN). Discharge paper work given and reviewed.   Discharge Medication: Allergies as of 06/21/2020   No Known Allergies     Medication List    STOP taking these medications   furosemide 40 MG tablet Commonly known as: Lasix   pioglitazone 30 MG tablet Commonly known as: ACTOS     TAKE these medications   amLODipine 10 MG tablet Commonly known as: NORVASC Take 1 tablet (10 mg total) by mouth daily.   glimepiride 4 MG tablet Commonly known as: Amaryl Take 1 tablet (4 mg total) by mouth daily before breakfast. What changed: when to take this   hydrALAZINE 100 MG tablet Commonly known as: APRESOLINE Take 1 tablet (100 mg total) by mouth 3 (three) times daily.   labetalol 200 MG tablet Commonly known as: NORMODYNE Take 1 tablet (200 mg total) by mouth 2 (two) times daily.   metFORMIN 500 MG 24 hr tablet Commonly known as: GLUCOPHAGE-XR Take 2 tablets (1,000 mg total) by mouth in the morning and at bedtime.   multivitamin with minerals Tabs tablet Take 1 tablet by mouth daily.   pravastatin 40 MG tablet Commonly known as: PRAVACHOL Take 40 mg by mouth in the morning.   torsemide 20 MG tablet Commonly known as: DEMADEX Take 2 tablets (40 mg total) by mouth 2 (two) times daily. Start taking on: June 22, 2020       Discharge Assessment: Vitals:   06/21/20 0615 06/21/20 0828  BP: (!) 145/82 (!) 141/73  Pulse: 83 81  Resp: 20   Temp: 99.6 F (37.6 C)   SpO2: 95%    Skin clean, dry and intact without evidence of skin break down, no evidence of skin tears noted. IV catheter discontinued intact. Site without signs and symptoms of complications - no  redness or edema noted at insertion site, patient denies c/o pain - only slight tenderness at site.  Dressing with slight pressure applied.  D/c Instructions-Education: Discharge instructions given to patient/family with verbalized understanding. D/c education completed with patient/family including follow up instructions, medication list, d/c activities limitations if indicated, with other d/c instructions as indicated by MD - patient able to verbalize understanding, all questions fully answered. Patient instructed to return to ED, call 911, or call MD for any changes in condition.  Patient escorted via [place], and D/C home via private auto.  Zenaida Deed, RN 06/21/2020 1:22 PM

## 2020-06-22 ENCOUNTER — Ambulatory Visit: Payer: Commercial Managed Care - PPO | Admitting: Physician Assistant

## 2020-06-22 NOTE — Progress Notes (Signed)
Cardiology Clinic Note   Patient Name: [name] Date of Encounter: 06/23/2020  Primary Care Provider:  [name], MD Primary Cardiologist:  Carlyle Dolly, MD  Patient Profile    [name] 54 year old male presents the clinic today for follow-up evaluation of his acute on chronic CHF.  Past Medical History    Past Medical History:  Diagnosis Date   CHF (congestive heart failure) ([place])    Diabetes mellitus    Hypertension    Past Surgical History:  Procedure Laterality Date   INCISION AND DRAINAGE     KNEE SURGERY      Allergies  No Known Allergies  History of Present Illness    [name] has a PMH of essential hypertension, chronic diastolic CHF, hypertensive urgency, community-acquired pneumonia, type 2 diabetes, CKD stage III, bilateral lower extremity edema, obesity, and protein calorie malnutrition.  He was admitted to the hospital 06/14/2020 until 06/21/2020.  He presented to the hospital none reported increased dyspnea that started on 06/14/2020.  He also had associated dizziness, and fluid retention.  He was scheduled for cardiology follow-up however, he felt when he presented the clinic that he could not breathe.  He was also told that he had presented on the wrong day for his appointment.  Given the information he was taken to the emergency department for further evaluation.  He reported medication compliance.  He also reported increased orthopnea, lower extremity edema, and fluid accumulation in his midsection.  He required 2 L nasal cannula in the ER.  He received IV diuresis and his discharge weight was 260.9 pounds.  He was discharged home on torsemide 40 mg twice daily.  His blood pressure was stable and he was continued on amlodipine, hydralazine, and labetalol.  His kidney function was maintained at baseline 1.8-2.1.  He presents to the clinic today for follow-up evaluation and states he continues to feel well.  He has been weighing  himself daily in the morning after voiding.  His weight remains stable at 206 pounds.  He has been avoiding salty foods and trying to monitor his diet more closely.  He reports increasing his physical activity at home.  However, he does not have much activity tolerance at this time.  He notes increased heart rate with increased activity and normal heart rate with reduced activity.  We reviewed the importance of maintaining a log for daily weights, following a strict low-sodium diet, and the importance of increasing his physical fitness.  He presented to his PCP today who is referring him to nephrology for close monitoring of his CKD.  I will give him a salty 6 diet sheet, have him continue to weigh himself daily calling the office with a weight increase of 3 pounds overnight or 5 pounds in a week, and will have him follow-up in 3 months.  We reviewed signs and symptoms of fluid volume overload.  He and his wife expressed understanding.  Today he denies chest pain, shortness of breath, lower extremity edema, fatigue, palpitations, melena, hematuria, hemoptysis, diaphoresis, weakness, presyncope, syncope, orthopnea, and PND.   Home Medications    Prior to Admission medications   Medication Sig Start Date End Date Taking? Authorizing Provider  amLODipine (NORVASC) 10 MG tablet Take 1 tablet (10 mg total) by mouth daily. 04/28/20   Imogene Burn, PA-C  glimepiride (AMARYL) 4 MG tablet Take 1 tablet (4 mg total) by mouth daily before breakfast. Patient taking differently: Take 4 mg by mouth in the morning  and at bedtime.  07/24/11 06/14/20  [name], MD  hydrALAZINE (APRESOLINE) 100 MG tablet Take 1 tablet (100 mg total) by mouth 3 (three) times daily. 04/10/20   Barton Dubois, MD  labetalol (NORMODYNE) 200 MG tablet Take 1 tablet (200 mg total) by mouth 2 (two) times daily. 04/28/20   Imogene Burn, PA-C  metFORMIN (GLUCOPHAGE-XR) 500 MG 24 hr tablet Take 2 tablets (1,000 mg total) by mouth in  the morning and at bedtime. 04/10/20   Barton Dubois, MD  Multiple Vitamin (MULTIVITAMIN WITH MINERALS) TABS tablet Take 1 tablet by mouth daily.    [provider]  pravastatin (PRAVACHOL) 40 MG tablet Take 40 mg by mouth in the morning.     [provider]  torsemide (DEMADEX) 20 MG tablet Take 2 tablets (40 mg total) by mouth 2 (two) times daily. 06/22/20   Orson Eva, MD    Family History    Family History  Problem Relation Age of Onset   Dementia Mother    Diabetes Father    He indicated that his mother is deceased. He indicated that his father is alive. He indicated that both of his sisters are alive. He indicated that all of his three brothers are alive.  Social History    Social History   Socioeconomic History   Marital status: Married    Spouse name: Not on file   Number of children: Not on file   Years of education: Not on file   Highest education level: Not on file  Occupational History   Not on file  Tobacco Use   Smoking status: Never Smoker   Smokeless tobacco: Never Used  Vaping Use   Vaping Use: Never used  Substance and Sexual Activity   Alcohol use: No   Drug use: No   Sexual activity: Not on file  Other Topics Concern   Not on file  Social History Narrative   Not on file   Social Determinants of Health   Financial Resource Strain:    Difficulty of Paying Living Expenses: Not on file  Food Insecurity:    Worried About [place] in the Last Year: Not on file   Ran Out of Food in the Last Year: Not on file  Transportation Needs:    Lack of Transportation (Medical): Not on file   Lack of Transportation (Non-Medical): Not on file  Physical Activity:    Days of Exercise per Week: Not on file   Minutes of Exercise per Session: Not on file  Stress:    Feeling of Stress : Not on file  Social Connections:    Frequency of Communication with Friends and Family: Not on file   Frequency of Social  Gatherings with Friends and Family: Not on file   Attends Religious Services: Not on file   Active Member of Clubs or Organizations: Not on file   Attends Archivist Meetings: Not on file   Marital Status: Not on file  Intimate Partner Violence:    Fear of Current or Ex-Partner: Not on file   Emotionally Abused: Not on file   Physically Abused: Not on file   Sexually Abused: Not on file     Review of Systems    General:  No chills, fever, night sweats or weight changes.  Cardiovascular:  No chest pain, dyspnea on exertion, edema, orthopnea, palpitations, paroxysmal nocturnal dyspnea. Dermatological: No rash, lesions/masses Respiratory: No cough, dyspnea Urologic: No hematuria, dysuria Abdominal:   No  nausea, vomiting, diarrhea, bright red blood per rectum, melena, or hematemesis Neurologic:  No visual changes, wkns, changes in mental status. All other systems reviewed and are otherwise negative except as noted above.  Physical Exam    VS:  BP 122/78    Pulse 90    Ht 5\' 11"  (1.803 m)    Wt 260 lb 12.8 oz (118.3 kg)    SpO2 96%    BMI 36.37 kg/m  , BMI Body mass index is 36.37 kg/m. GEN: Well nourished, well developed, in no acute distress. HEENT: normal. Neck: Supple, no JVD, carotid bruits, or masses. Cardiac: RRR, no murmurs, rubs, or gallops. No clubbing, cyanosis, edema.  Radials/DP/PT 2+ and equal bilaterally.  Respiratory:  Respirations regular and unlabored, clear to auscultation bilaterally. GI: Soft, nontender, nondistended, BS + x 4. MS: no deformity or atrophy. Skin: warm and dry, no rash. Neuro:  Strength and sensation are intact. Psych: Normal affect.  Accessory Clinical Findings    Recent Labs: 06/14/2020: B Natriuretic Peptide 266.0 06/15/2020: ALT 24; TSH 2.093 06/16/2020: Hemoglobin 9.6; Platelets 287 06/21/2020: BUN 48; Creatinine, Ser 2.43; Magnesium 2.2; Potassium 4.3; Sodium 137   Recent Lipid Panel    Component Value Date/Time     CHOL 134 06/16/2020 0823   TRIG 258 (H) 06/16/2020 0823   HDL 51 06/16/2020 0823   CHOLHDL 2.6 06/16/2020 0823   VLDL 52 (H) 06/16/2020 0823   LDLCALC 31 06/16/2020 0823    ECG personally reviewed by me today-none today.  EKG 06/15/2020 Sinus rhythm nonspecific T wave abnormalities lateral leads 84 bpm  Echocardiogram 04/06/2020 IMPRESSIONS    1. Left ventricular ejection fraction, by estimation, is 50%. The left  ventricle has normal function. The left ventricle has no regional wall  motion abnormalities. There is severe left ventricular hypertrophy. Left  ventricular diastolic parameters are  consistent with Grade II diastolic dysfunction (pseudonormalization).  Elevated left atrial pressure.  2. Right ventricular systolic function is normal. The right ventricular  size is normal.  3. Left atrial size was mildly dilated.  4. Large pleural effusion in the left lateral region.  5. The mitral valve is normal in structure. Trivial mitral valve  regurgitation. No evidence of mitral stenosis.  6. The aortic valve is tricuspid. Aortic valve regurgitation is not  visualized. No aortic stenosis is present.  7. The inferior vena cava is normal in size with greater than 50%  respiratory variability, suggesting right atrial pressure of 3 mmHg.   Assessment & Plan   1.  Acute on chronic diastolic CHF-euvolemic today.  No increased DOE or activity intolerance.  Weight today 260 pounds 12 ounces.  Discharged at 260.9 pounds. Continue torsemide  heart healthy low-sodium diet-salty 6 given Increase physical activity as tolerated Daily weights-call clinic with a weight increase of 3 pounds overnight or 5 pounds in a week Lower extremity support stockings Elevate lower extremities when not active  Essential hypertension-BP today 122/78.  Well-controlled at home. Continue amlodipine, hydralazine, labetalol, torsemide Heart healthy low-sodium diet-salty 6 given Increase physical  activity as tolerated  Hyperlipidemia-LDL 87 on 9/21 Continue pravastatin Heart healthy low-sodium high-fiber diet Increase physical activity as tolerated  CKD stage III-creatinine 2.43 on 06/21/2020.  Baseline creatinine 1.8-2.1 Follows with nephology-PCP made referral.  Disposition: Follow-up with Dr. Harl Bowie in 3 months.    Jossie Ng. Neve Branscomb NP-C    06/23/2020, 3:31 PM [facility] [place] [address] Office [phone] Fax [phone]  Notice: This dictation was  prepared with Dragon dictation along with smaller phrase technology. Any transcriptional errors that result from this process are unintentional and may not be corrected upon review.

## 2020-06-23 ENCOUNTER — Encounter: Payer: Self-pay | Admitting: General Practice

## 2020-06-23 ENCOUNTER — Other Ambulatory Visit: Payer: Self-pay

## 2020-06-23 ENCOUNTER — Ambulatory Visit (INDEPENDENT_AMBULATORY_CARE_PROVIDER_SITE_OTHER): Payer: Commercial Managed Care - PPO | Admitting: General Practice

## 2020-06-23 VITALS — BP 122/78 | HR 90 | Ht 71.0 in | Wt 260.8 lb

## 2020-06-23 DIAGNOSIS — I5033 Acute on chronic diastolic (congestive) heart failure: Secondary | ICD-10-CM | POA: Diagnosis not present

## 2020-06-23 DIAGNOSIS — N183 Chronic kidney disease, stage 3 unspecified: Secondary | ICD-10-CM

## 2020-06-23 DIAGNOSIS — E785 Hyperlipidemia, unspecified: Secondary | ICD-10-CM | POA: Diagnosis not present

## 2020-06-23 DIAGNOSIS — I10 Essential (primary) hypertension: Secondary | ICD-10-CM | POA: Diagnosis not present

## 2020-06-23 DIAGNOSIS — I1A0 Resistant hypertension: Secondary | ICD-10-CM

## 2020-06-23 NOTE — Patient Instructions (Signed)
Medication Instructions:  Continue all current medications.  Labwork: none  Testing/Procedures: none  Follow-Up: 3 months   Any Other Special Instructions Will Be Listed Below (If Applicable).  Weigh daily - please call the office for weight gain of 3 lbs in 24 hours or 5 lbs in 1 week.   May wear support stockings during day & remove at night.  Elevate legs while sitting.   Slowly increase activity - break up with short walks through out the day.   If you need a refill on your cardiac medications before your next appointment, please call your pharmacy.

## 2020-08-24 ENCOUNTER — Other Ambulatory Visit: Payer: Self-pay | Admitting: Nephrology

## 2020-08-24 ENCOUNTER — Other Ambulatory Visit (HOSPITAL_COMMUNITY): Payer: Self-pay | Admitting: Nephrology

## 2020-08-24 DIAGNOSIS — E1129 Type 2 diabetes mellitus with other diabetic kidney complication: Secondary | ICD-10-CM

## 2020-08-24 DIAGNOSIS — E1122 Type 2 diabetes mellitus with diabetic chronic kidney disease: Secondary | ICD-10-CM

## 2020-08-24 DIAGNOSIS — I129 Hypertensive chronic kidney disease with stage 1 through stage 4 chronic kidney disease, or unspecified chronic kidney disease: Secondary | ICD-10-CM

## 2020-08-24 DIAGNOSIS — R809 Proteinuria, unspecified: Secondary | ICD-10-CM

## 2020-08-24 DIAGNOSIS — D638 Anemia in other chronic diseases classified elsewhere: Secondary | ICD-10-CM

## 2020-09-02 ENCOUNTER — Ambulatory Visit (HOSPITAL_COMMUNITY): Payer: Commercial Managed Care - PPO

## 2020-09-02 ENCOUNTER — Encounter (HOSPITAL_COMMUNITY): Payer: Self-pay

## 2020-09-06 ENCOUNTER — Other Ambulatory Visit: Payer: Self-pay

## 2020-09-06 ENCOUNTER — Other Ambulatory Visit (HOSPITAL_COMMUNITY)
Admission: RE | Admit: 2020-09-06 | Discharge: 2020-09-06 | Disposition: A | Discharge status: Home / Self Care | Payer: Commercial Managed Care - PPO | Source: Ambulatory Visit | Attending: Nephrology | Admitting: Nephrology

## 2020-09-06 DIAGNOSIS — R809 Proteinuria, unspecified: Secondary | ICD-10-CM | POA: Diagnosis present

## 2020-09-06 DIAGNOSIS — I5032 Chronic diastolic (congestive) heart failure: Secondary | ICD-10-CM | POA: Insufficient documentation

## 2020-09-06 DIAGNOSIS — Z79899 Other long term (current) drug therapy: Secondary | ICD-10-CM | POA: Diagnosis present

## 2020-09-06 DIAGNOSIS — E1122 Type 2 diabetes mellitus with diabetic chronic kidney disease: Secondary | ICD-10-CM | POA: Insufficient documentation

## 2020-09-06 DIAGNOSIS — N189 Chronic kidney disease, unspecified: Secondary | ICD-10-CM | POA: Insufficient documentation

## 2020-09-06 DIAGNOSIS — D638 Anemia in other chronic diseases classified elsewhere: Secondary | ICD-10-CM | POA: Insufficient documentation

## 2020-09-06 DIAGNOSIS — I129 Hypertensive chronic kidney disease with stage 1 through stage 4 chronic kidney disease, or unspecified chronic kidney disease: Secondary | ICD-10-CM | POA: Insufficient documentation

## 2020-09-06 DIAGNOSIS — E1129 Type 2 diabetes mellitus with other diabetic kidney complication: Secondary | ICD-10-CM | POA: Insufficient documentation

## 2020-09-06 LAB — CBC WITH DIFFERENTIAL/PLATELET
Abs Immature Granulocytes: 0.01 10*3/uL (ref 0.00–0.07)
Basophils Absolute: 0 10*3/uL (ref 0.0–0.1)
Basophils Relative: 1 %
Eosinophils Absolute: 0.1 10*3/uL (ref 0.0–0.5)
Eosinophils Relative: 2 %
HCT: 33.1 % — ABNORMAL LOW (ref 39.0–52.0)
Hemoglobin: 10.8 g/dL — ABNORMAL LOW (ref 13.0–17.0)
Immature Granulocytes: 0 %
Lymphocytes Relative: 24 %
Lymphs Abs: 1.2 10*3/uL (ref 0.7–4.0)
MCH: 27.5 pg (ref 26.0–34.0)
MCHC: 32.6 g/dL (ref 30.0–36.0)
MCV: 84.2 fL (ref 80.0–100.0)
Monocytes Absolute: 0.5 10*3/uL (ref 0.1–1.0)
Monocytes Relative: 11 %
Neutro Abs: 3.1 10*3/uL (ref 1.7–7.7)
Neutrophils Relative %: 62 %
Platelets: 298 10*3/uL (ref 150–400)
RBC: 3.93 MIL/uL — ABNORMAL LOW (ref 4.22–5.81)
RDW: 14.6 % (ref 11.5–15.5)
WBC: 4.9 10*3/uL (ref 4.0–10.5)
nRBC: 0 % (ref 0.0–0.2)

## 2020-09-06 LAB — CREATININE CLEARANCE, URINE, 24 HOUR
Collection Interval-CRCL: 24 hours
Creatinine Clearance: 73 mL/min — ABNORMAL LOW (ref 75–125)
Creatinine, 24H Ur: 2162 mg/d — ABNORMAL HIGH (ref 800–2000)
Creatinine, Urine: 72.07 mg/dL
Urine Total Volume-CRCL: 3000 mL

## 2020-09-06 LAB — IRON AND TIBC
Iron: 62 ug/dL (ref 45–182)
Saturation Ratios: 20 % (ref 17.9–39.5)
TIBC: 314 ug/dL (ref 250–450)
UIBC: 252 ug/dL

## 2020-09-06 LAB — COMPREHENSIVE METABOLIC PANEL
ALT: 19 U/L (ref 0–44)
AST: 18 U/L (ref 15–41)
Albumin: 3.5 g/dL (ref 3.5–5.0)
Alkaline Phosphatase: 79 U/L (ref 38–126)
Anion gap: 6 (ref 5–15)
BUN: 47 mg/dL — ABNORMAL HIGH (ref 6–20)
CO2: 23 mmol/L (ref 22–32)
Calcium: 8.7 mg/dL — ABNORMAL LOW (ref 8.9–10.3)
Chloride: 109 mmol/L (ref 98–111)
Creatinine, Ser: 1.95 mg/dL — ABNORMAL HIGH (ref 0.61–1.24)
GFR, Estimated: 40 mL/min — ABNORMAL LOW (ref >60)
Glucose, Bld: 133 mg/dL — ABNORMAL HIGH (ref 70–99)
Potassium: 5 mmol/L (ref 3.5–5.1)
Sodium: 138 mmol/L (ref 135–145)
Total Bilirubin: 0.7 mg/dL (ref 0.3–1.2)
Total Protein: 6.9 g/dL (ref 6.5–8.1)

## 2020-09-06 LAB — PROTEIN / CREATININE RATIO, URINE
Creatinine, Urine: 70.67 mg/dL
Protein Creatinine Ratio: 4.05 mg/mg{Cre} — ABNORMAL HIGH (ref 0.00–0.15)
Total Protein, Urine: 286 mg/dL

## 2020-09-06 LAB — PHOSPHORUS: Phosphorus: 4.4 mg/dL (ref 2.5–4.6)

## 2020-09-06 LAB — PROTEIN, URINE, 24 HOUR
Collection Interval-UPROT: 24 hours
Protein, 24H Urine: 8580 mg/d — ABNORMAL HIGH (ref 50–100)
Protein, Urine: 286 mg/dL
Urine Total Volume-UPROT: 3000 mL

## 2020-09-06 LAB — VITAMIN D 25 HYDROXY (VIT D DEFICIENCY, FRACTURES): Vit D, 25-Hydroxy: 38.56 ng/mL (ref 30–100)

## 2020-09-06 LAB — HEPATITIS B SURFACE ANTIGEN: Hepatitis B Surface Ag: NONREACTIVE

## 2020-09-06 LAB — MAGNESIUM: Magnesium: 2.1 mg/dL (ref 1.7–2.4)

## 2020-09-06 LAB — VITAMIN B12: Vitamin B-12: 559 pg/mL (ref 180–914)

## 2020-09-06 LAB — FERRITIN: Ferritin: 128 ng/mL (ref 24–336)

## 2020-09-06 LAB — URIC ACID: Uric Acid, Serum: 8.1 mg/dL (ref 3.7–8.6)

## 2020-09-07 LAB — MISC LABCORP TEST (SEND OUT): Labcorp test code: 141330

## 2020-09-07 LAB — PTH, INTACT AND CALCIUM
Calcium, Total (PTH): 8.9 mg/dL (ref 8.7–10.2)
PTH: 35 pg/mL (ref 15–65)

## 2020-09-07 LAB — HEMOGLOBIN A1C
Hgb A1c MFr Bld: 6.9 % — ABNORMAL HIGH (ref 4.8–5.6)
Mean Plasma Glucose: 151 mg/dL

## 2020-09-07 LAB — KAPPA/LAMBDA LIGHT CHAINS
Kappa free light chain: 62 mg/L — ABNORMAL HIGH (ref 3.3–19.4)
Kappa, lambda light chain ratio: 1.72 — ABNORMAL HIGH (ref 0.26–1.65)
Lambda free light chains: 36 mg/L — ABNORMAL HIGH (ref 5.7–26.3)

## 2020-09-07 LAB — GLOMERULAR BASEMENT MEMBRANE ANTIBODIES: GBM Ab: 4 units (ref 0–20)

## 2020-09-07 LAB — ANA: Anti Nuclear Antibody (ANA): POSITIVE — AB

## 2020-09-07 LAB — HEPATITIS B SURFACE ANTIBODY,QUALITATIVE: Hep B S Ab: NONREACTIVE

## 2020-09-07 LAB — C3 COMPLEMENT: C3 Complement: 121 mg/dL (ref 82–167)

## 2020-09-07 LAB — HEPATITIS C ANTIBODY: HCV Ab: NONREACTIVE

## 2020-09-07 LAB — C4 COMPLEMENT: Complement C4, Body Fluid: 25 mg/dL (ref 12–38)

## 2020-09-08 LAB — PROTEIN ELECTROPHORESIS, SERUM
A/G Ratio: 1.1 (ref 0.7–1.7)
Albumin ELP: 3.5 g/dL (ref 2.9–4.4)
Alpha-1-Globulin: 0.2 g/dL (ref 0.0–0.4)
Alpha-2-Globulin: 0.6 g/dL (ref 0.4–1.0)
Beta Globulin: 1 g/dL (ref 0.7–1.3)
Gamma Globulin: 1.3 g/dL (ref 0.4–1.8)
Globulin, Total: 3.1 g/dL (ref 2.2–3.9)
Total Protein ELP: 6.6 g/dL (ref 6.0–8.5)

## 2020-09-08 LAB — ANCA TITERS
Atypical P-ANCA titer: <1:20 {titer}
C-ANCA: <1:20 {titer}
P-ANCA: <1:20 {titer}

## 2020-09-08 LAB — IMMUNOFIXATION, URINE

## 2020-09-08 LAB — MPO/PR-3 (ANCA) ANTIBODIES
ANCA Proteinase 3: <3.5 U/mL (ref 0.0–3.5)
Myeloperoxidase Abs: <9 U/mL (ref 0.0–9.0)

## 2020-09-09 LAB — HIV-1/2 AB - DIFFERENTIATION
HIV 1 Ab: NONREACTIVE
HIV 2 Ab: NONREACTIVE
Note: NEGATIVE

## 2020-09-09 LAB — HEPATITIS C GENOTYPE

## 2020-09-17 LAB — MISC LABCORP TEST (SEND OUT): Labcorp test code: 83962

## 2020-09-27 ENCOUNTER — Encounter (HOSPITAL_COMMUNITY): Payer: Self-pay | Admitting: *Deleted

## 2020-09-27 ENCOUNTER — Emergency Department (HOSPITAL_COMMUNITY): Payer: Commercial Managed Care - PPO

## 2020-09-27 ENCOUNTER — Inpatient Hospital Stay (HOSPITAL_COMMUNITY)
Admission: EM | Admit: 2020-09-27 | Discharge: 2020-10-01 | DRG: 291 | Disposition: A | Discharge status: Home / Self Care | Payer: Commercial Managed Care - PPO | Attending: Family Medicine | Admitting: Family Medicine

## 2020-09-27 ENCOUNTER — Other Ambulatory Visit: Payer: Self-pay

## 2020-09-27 DIAGNOSIS — Z6841 Body Mass Index (BMI) 40.0 and over, adult: Secondary | ICD-10-CM | POA: Diagnosis not present

## 2020-09-27 DIAGNOSIS — D631 Anemia in chronic kidney disease: Secondary | ICD-10-CM | POA: Diagnosis present

## 2020-09-27 DIAGNOSIS — Z833 Family history of diabetes mellitus: Secondary | ICD-10-CM | POA: Diagnosis not present

## 2020-09-27 DIAGNOSIS — I10 Essential (primary) hypertension: Secondary | ICD-10-CM | POA: Diagnosis not present

## 2020-09-27 DIAGNOSIS — Z20822 Contact with and (suspected) exposure to covid-19: Secondary | ICD-10-CM | POA: Diagnosis present

## 2020-09-27 DIAGNOSIS — I5033 Acute on chronic diastolic (congestive) heart failure: Secondary | ICD-10-CM | POA: Diagnosis present

## 2020-09-27 DIAGNOSIS — N1831 Chronic kidney disease, stage 3a: Secondary | ICD-10-CM

## 2020-09-27 DIAGNOSIS — Z79899 Other long term (current) drug therapy: Secondary | ICD-10-CM | POA: Diagnosis not present

## 2020-09-27 DIAGNOSIS — I130 Hypertensive heart and chronic kidney disease with heart failure and stage 1 through stage 4 chronic kidney disease, or unspecified chronic kidney disease: Principal | ICD-10-CM | POA: Diagnosis present

## 2020-09-27 DIAGNOSIS — N183 Chronic kidney disease, stage 3 unspecified: Secondary | ICD-10-CM | POA: Diagnosis present

## 2020-09-27 DIAGNOSIS — N179 Acute kidney failure, unspecified: Secondary | ICD-10-CM | POA: Diagnosis present

## 2020-09-27 DIAGNOSIS — J90 Pleural effusion, not elsewhere classified: Secondary | ICD-10-CM | POA: Diagnosis present

## 2020-09-27 DIAGNOSIS — C649 Malignant neoplasm of unspecified kidney, except renal pelvis: Secondary | ICD-10-CM | POA: Diagnosis present

## 2020-09-27 DIAGNOSIS — E669 Obesity, unspecified: Secondary | ICD-10-CM

## 2020-09-27 DIAGNOSIS — E1121 Type 2 diabetes mellitus with diabetic nephropathy: Secondary | ICD-10-CM

## 2020-09-27 DIAGNOSIS — I509 Heart failure, unspecified: Secondary | ICD-10-CM

## 2020-09-27 DIAGNOSIS — R0600 Dyspnea, unspecified: Secondary | ICD-10-CM

## 2020-09-27 DIAGNOSIS — Z7984 Long term (current) use of oral hypoglycemic drugs: Secondary | ICD-10-CM | POA: Diagnosis not present

## 2020-09-27 DIAGNOSIS — E1122 Type 2 diabetes mellitus with diabetic chronic kidney disease: Secondary | ICD-10-CM | POA: Diagnosis present

## 2020-09-27 LAB — CBC
HCT: 29.3 % — ABNORMAL LOW (ref 39.0–52.0)
Hemoglobin: 9.4 g/dL — ABNORMAL LOW (ref 13.0–17.0)
MCH: 27.6 pg (ref 26.0–34.0)
MCHC: 32.1 g/dL (ref 30.0–36.0)
MCV: 85.9 fL (ref 80.0–100.0)
Platelets: 261 10*3/uL (ref 150–400)
RBC: 3.41 MIL/uL — ABNORMAL LOW (ref 4.22–5.81)
RDW: 15.1 % (ref 11.5–15.5)
WBC: 5.3 10*3/uL (ref 4.0–10.5)
nRBC: 0 % (ref 0.0–0.2)

## 2020-09-27 LAB — BRAIN NATRIURETIC PEPTIDE: B Natriuretic Peptide: 219 pg/mL — ABNORMAL HIGH (ref 0.0–100.0)

## 2020-09-27 LAB — RESP PANEL BY RT-PCR (FLU A&B, COVID) ARPGX2
Influenza A by PCR: NEGATIVE
Influenza B by PCR: NEGATIVE
SARS Coronavirus 2 by RT PCR: NEGATIVE

## 2020-09-27 LAB — BASIC METABOLIC PANEL
Anion gap: 8 (ref 5–15)
BUN: 57 mg/dL — ABNORMAL HIGH (ref 6–20)
CO2: 22 mmol/L (ref 22–32)
Calcium: 8.2 mg/dL — ABNORMAL LOW (ref 8.9–10.3)
Chloride: 110 mmol/L (ref 98–111)
Creatinine, Ser: 2.9 mg/dL — ABNORMAL HIGH (ref 0.61–1.24)
GFR, Estimated: 25 mL/min — ABNORMAL LOW (ref >60)
Glucose, Bld: 73 mg/dL (ref 70–99)
Potassium: 4.9 mmol/L (ref 3.5–5.1)
Sodium: 140 mmol/L (ref 135–145)

## 2020-09-27 LAB — TROPONIN I (HIGH SENSITIVITY)
Troponin I (High Sensitivity): 11 ng/L (ref <18)
Troponin I (High Sensitivity): 11 ng/L (ref <18)

## 2020-09-27 LAB — GLUCOSE, CAPILLARY: Glucose-Capillary: 122 mg/dL — ABNORMAL HIGH (ref 70–99)

## 2020-09-27 LAB — MAGNESIUM: Magnesium: 1.8 mg/dL (ref 1.7–2.4)

## 2020-09-27 MED ORDER — HEPARIN SODIUM (PORCINE) 5000 UNIT/ML IJ SOLN
5000.0000 [IU] | Freq: Three times a day (TID) | INTRAMUSCULAR | Status: DC
  Administered 2020-09-27 – 2020-10-01 (×11): 5000 [IU] via SUBCUTANEOUS
  Filled 2020-09-27 (×11): qty 1

## 2020-09-27 MED ORDER — HYDRALAZINE HCL 25 MG PO TABS
100.0000 mg | ORAL_TABLET | Freq: Three times a day (TID) | ORAL | Status: DC
  Administered 2020-09-28 – 2020-10-01 (×10): 100 mg via ORAL
  Filled 2020-09-27 (×2): qty 4
  Filled 2020-09-27: qty 2
  Filled 2020-09-27 (×6): qty 4
  Filled 2020-09-27 (×2): qty 2
  Filled 2020-09-27 (×2): qty 4
  Filled 2020-09-27: qty 2

## 2020-09-27 MED ORDER — FUROSEMIDE 10 MG/ML IJ SOLN
80.0000 mg | Freq: Once | INTRAMUSCULAR | Status: AC
  Administered 2020-09-27: 80 mg via INTRAVENOUS
  Filled 2020-09-27: qty 8

## 2020-09-27 MED ORDER — INSULIN ASPART 100 UNIT/ML ~~LOC~~ SOLN: 0.0000 [IU] | Freq: Every day | SUBCUTANEOUS | Status: DC

## 2020-09-27 MED ORDER — INSULIN ASPART 100 UNIT/ML ~~LOC~~ SOLN
0.0000 [IU] | Freq: Three times a day (TID) | SUBCUTANEOUS | Status: DC
  Administered 2020-09-28 – 2020-09-29 (×2): 2 [IU] via SUBCUTANEOUS
  Administered 2020-09-30: 3 [IU] via SUBCUTANEOUS

## 2020-09-27 MED ORDER — ONDANSETRON HCL 4 MG PO TABS: 4.0000 mg | ORAL_TABLET | Freq: Four times a day (QID) | ORAL | Status: DC | PRN

## 2020-09-27 MED ORDER — ENOXAPARIN SODIUM 30 MG/0.3ML ~~LOC~~ SOLN: 30.0000 mg | SUBCUTANEOUS | Status: DC

## 2020-09-27 MED ORDER — FUROSEMIDE 10 MG/ML IJ SOLN
60.0000 mg | Freq: Two times a day (BID) | INTRAMUSCULAR | Status: DC
  Administered 2020-09-28 – 2020-10-01 (×7): 60 mg via INTRAVENOUS
  Filled 2020-09-27 (×7): qty 6

## 2020-09-27 MED ORDER — LABETALOL HCL 200 MG PO TABS
200.0000 mg | ORAL_TABLET | Freq: Two times a day (BID) | ORAL | Status: DC
  Administered 2020-09-27 – 2020-10-01 (×8): 200 mg via ORAL
  Filled 2020-09-27 (×8): qty 1

## 2020-09-27 MED ORDER — ACETAMINOPHEN 325 MG PO TABS: 650.0000 mg | ORAL_TABLET | Freq: Four times a day (QID) | ORAL | Status: DC | PRN

## 2020-09-27 MED ORDER — PRAVASTATIN SODIUM 40 MG PO TABS
40.0000 mg | ORAL_TABLET | Freq: Every day | ORAL | Status: DC
  Administered 2020-09-28 – 2020-10-01 (×4): 40 mg via ORAL
  Filled 2020-09-27: qty 1
  Filled 2020-09-27: qty 4
  Filled 2020-09-27 (×2): qty 1

## 2020-09-27 MED ORDER — POLYETHYLENE GLYCOL 3350 17 G PO PACK: 17.0000 g | PACK | Freq: Every day | ORAL | Status: DC | PRN

## 2020-09-27 MED ORDER — AMLODIPINE BESYLATE 5 MG PO TABS
10.0000 mg | ORAL_TABLET | Freq: Every day | ORAL | Status: DC
  Administered 2020-09-28 – 2020-10-01 (×4): 10 mg via ORAL
  Filled 2020-09-27 (×4): qty 2

## 2020-09-27 MED ORDER — ONDANSETRON HCL 4 MG/2ML IJ SOLN: 4.0000 mg | Freq: Four times a day (QID) | INTRAMUSCULAR | Status: DC | PRN

## 2020-09-27 MED ORDER — ACETAMINOPHEN 650 MG RE SUPP: 650.0000 mg | Freq: Four times a day (QID) | RECTAL | Status: DC | PRN

## 2020-09-27 NOTE — ED Provider Notes (Signed)
[facility] EMERGENCY DEPARTMENT Provider Note   CSN: [id number] Arrival date & time: 09/27/20  1501     History Chief Complaint  Patient presents with  . Shortness of Breath    [name] is a 55 y.o. male.  He has a history of hypertension, diastolic CHF, CKD.  He was last admitted in November for CHF exacerbation.  He says he left the hospital feeling a little bit better but since then his breathing is gotten progressively worse.  He has been gaining weight but does not know how much, does not check his daily weights.  Taking the same medications.  Dyspnea on any type of exertion.  Also has orthopnea.  Minimal cough.  No fevers chills nausea vomiting abdominal pain diarrhea constipation.  He is Covid vaccinated not boosted.  The history is provided by the patient.  Shortness of Breath Severity:  Moderate Onset quality:  Gradual Timing:  Constant Progression:  Worsening Chronicity:  Recurrent Context: activity   Relieved by:  Nothing Worsened by:  Activity Ineffective treatments:  Diuretics Associated symptoms: no abdominal pain, no chest pain, no cough, no fever, no headaches, no hemoptysis, no rash, no sore throat, no sputum production and no vomiting   Risk factors: no tobacco use        Past Medical History:  Diagnosis Date  . CHF (congestive heart failure) ([place])   . Diabetes mellitus   . Hypertension     Patient Active Problem List   Diagnosis Date Noted  . Uncontrolled type 2 diabetes mellitus with hyperglycemia ([place]) 06/16/2020  . Community acquired pneumonia of right lower lobe of lung   . CHF exacerbation ([place]) 06/14/2020  . Protein calorie malnutrition ([place]) 06/14/2020  . CKD (chronic kidney disease) stage 3, GFR 30-59 ml/min (HCC) 05/03/2020  . Hypertensive urgency 05/03/2020  . Blurred vision, bilateral 05/03/2020  . Acute on chronic diastolic (congestive) heart failure ([place]) 05/03/2020  . Essential hypertension   . Type 2 diabetes with nephropathy  ([place])   . Acute on chronic diastolic CHF (congestive heart failure) ([place])   . Acute renal failure superimposed on stage 3a chronic kidney disease ([place])   . Class 2 obesity   . Bilateral leg edema 04/05/2020    Past Surgical History:  Procedure Laterality Date  . INCISION AND DRAINAGE    . KNEE SURGERY         Family History  Problem Relation Age of Onset  . Dementia Mother   . Diabetes Father     Social History   Tobacco Use  . Smoking status: Never Smoker  . Smokeless tobacco: Never Used  Vaping Use  . Vaping Use: Never used  Substance Use Topics  . Alcohol use: No  . Drug use: No    Home Medications Prior to Admission medications   Medication Sig Start Date End Date Taking? Authorizing Provider  amLODipine (NORVASC) 10 MG tablet Take 1 tablet (10 mg total) by mouth daily. 04/28/20   Imogene Burn, PA-C  glimepiride (AMARYL) 4 MG tablet Take 2 mg by mouth in the morning and at bedtime.    [provider]  hydrALAZINE (APRESOLINE) 100 MG tablet Take 1 tablet (100 mg total) by mouth 3 (three) times daily. 04/10/20   Barton Dubois, MD  labetalol (NORMODYNE) 200 MG tablet Take 1 tablet (200 mg total) by mouth 2 (two) times daily. 04/28/20   Imogene Burn, PA-C  metFORMIN (GLUCOPHAGE-XR) 500 MG 24 hr tablet Take 2 tablets (1,000  mg total) by mouth in the morning and at bedtime. 04/10/20   Barton Dubois, MD  Multiple Vitamin (MULTIVITAMIN WITH MINERALS) TABS tablet Take 1 tablet by mouth daily.    [provider]  pravastatin (PRAVACHOL) 40 MG tablet Take 40 mg by mouth in the morning.     [provider]  torsemide (DEMADEX) 20 MG tablet Take 2 tablets (40 mg total) by mouth 2 (two) times daily. 06/22/20   Orson Eva, MD    Allergies    Patient has no known allergies.  Review of Systems   Review of Systems  Constitutional: Negative for fever.  HENT: Negative for sore throat.   Eyes: Positive for photophobia.  Respiratory: Positive for  shortness of breath. Negative for cough, hemoptysis and sputum production.   Cardiovascular: Positive for leg swelling. Negative for chest pain.  Gastrointestinal: Negative for abdominal pain and vomiting.  Genitourinary: Negative for dysuria.  Musculoskeletal: Negative for joint swelling.  Skin: Negative for rash.  Neurological: Positive for dizziness. Negative for headaches.    Physical Exam Updated Vital Signs BP (!) 147/72   Pulse 85   Temp 97.9 F (36.6 C) (Oral)   Resp (!) 22   Ht 5\' 11"  (1.803 m)   Wt 135.3 kg   SpO2 91%   BMI 41.59 kg/m   Physical Exam Vitals and nursing note reviewed.  Constitutional:      General: He is not in acute distress.    Appearance: Normal appearance. He is well-developed and well-nourished. He is obese.  HENT:     Head: Normocephalic and atraumatic.  Eyes:     Conjunctiva/sclera: Conjunctivae normal.  Cardiovascular:     Rate and Rhythm: Normal rate and regular rhythm.     Heart sounds: No murmur heard.   Pulmonary:     Effort: Pulmonary effort is normal. No respiratory distress.     Breath sounds: Normal breath sounds.  Abdominal:     Palpations: Abdomen is soft.     Tenderness: There is no abdominal tenderness.  Musculoskeletal:        General: Normal range of motion.     Cervical back: Neck supple.     Right lower leg: No tenderness. Edema present.     Left lower leg: No tenderness. Edema present.  Skin:    General: Skin is warm and dry.  Neurological:     General: No focal deficit present.     Mental Status: He is alert.  Psychiatric:        Mood and Affect: Mood and affect normal.     ED Results / Procedures / Treatments   Labs (all labs ordered are listed, but only abnormal results are displayed) Labs Reviewed  BASIC METABOLIC PANEL - Abnormal; Notable for the following components:      Result Value   BUN 57 (*)    Creatinine, Ser 2.90 (*)    Calcium 8.2 (*)    GFR, Estimated 25 (*)    All other components  within normal limits  BRAIN NATRIURETIC PEPTIDE - Abnormal; Notable for the following components:   B Natriuretic Peptide 219.0 (*)    All other components within normal limits  CBC - Abnormal; Notable for the following components:   RBC 3.41 (*)    Hemoglobin 9.4 (*)    HCT 29.3 (*)    All other components within normal limits  BASIC METABOLIC PANEL - Abnormal; Notable for the following components:   Glucose, Bld 61 (*)  BUN 57 (*)    Creatinine, Ser 2.86 (*)    Calcium 8.2 (*)    GFR, Estimated 25 (*)    All other components within normal limits  GLUCOSE, CAPILLARY - Abnormal; Notable for the following components:   Glucose-Capillary 122 (*)    All other components within normal limits  GLUCOSE, CAPILLARY - Abnormal; Notable for the following components:   Glucose-Capillary 56 (*)    All other components within normal limits  RESP PANEL BY RT-PCR (FLU A&B, COVID) ARPGX2  SARS CORONAVIRUS 2 (TAT 6-24 HRS)  MAGNESIUM  TROPONIN I (HIGH SENSITIVITY)  TROPONIN I (HIGH SENSITIVITY)    EKG EKG Interpretation  Date/Time:  Monday September 27 2020 15:18:46 EST Ventricular Rate:  84 PR Interval:    QRS Duration: 103 QT Interval:  406 QTC Calculation: 480 R Axis:   97 Text Interpretation: Sinus rhythm Borderline right axis deviation Nonspecific T abnormalities, lateral leads Borderline prolonged QT interval No significant change since prior 11/21 Confirmed by Aletta Edouard [phone]) on 09/27/2020 3:25:31 PM   Radiology DG Chest Portable 1 View  Result Date: 09/27/2020 CLINICAL DATA:  Short of breath for several days, diabetes, hypertension EXAM: PORTABLE CHEST 1 VIEW COMPARISON:  06/15/2020 FINDINGS: Single frontal view of the chest demonstrates stable enlargement of the cardiac silhouette. There is persistent right basilar consolidation and small right pleural effusion, with little change since the preceding exam. Mild chronic central vascular congestion. No pneumothorax. IMPRESSION:  1. Persistent right basilar consolidation and small right pleural effusion. 2. Chronic central vascular congestion. 3. Persistent enlargement the cardiac silhouette. Electronically Signed   By: Randa Ngo M.D.   On: 09/27/2020 15:42    Procedures Procedures   Medications Ordered in ED Medications  amLODipine (NORVASC) tablet 10 mg (has no administration in time range)  hydrALAZINE (APRESOLINE) tablet 100 mg (has no administration in time range)  labetalol (NORMODYNE) tablet 200 mg (200 mg Oral Given 09/27/20 2129)  pravastatin (PRAVACHOL) tablet 40 mg (has no administration in time range)  insulin aspart (novoLOG) injection 0-15 Units (0 Units Subcutaneous Not Given 09/28/20 0745)  insulin aspart (novoLOG) injection 0-5 Units (0 Units Subcutaneous Not Given 09/27/20 2128)  furosemide (LASIX) injection 60 mg (has no administration in time range)  acetaminophen (TYLENOL) tablet 650 mg (has no administration in time range)    Or  acetaminophen (TYLENOL) suppository 650 mg (has no administration in time range)  ondansetron (ZOFRAN) tablet 4 mg (has no administration in time range)    Or  ondansetron (ZOFRAN) injection 4 mg (has no administration in time range)  polyethylene glycol (MIRALAX / GLYCOLAX) packet 17 g (has no administration in time range)  heparin injection 5,000 Units (5,000 Units Subcutaneous Given 09/28/20 0539)  furosemide (LASIX) injection 80 mg (80 mg Intravenous Given 09/27/20 1740)    ED Course  I have reviewed the triage vital signs and the nursing notes.  Pertinent labs & imaging results that were available during my care of the patient were reviewed by me and considered in my medical decision making (see chart for details).  Clinical Course as of 09/28/20 1002  Mon Sep 27, 2020  1550 Chest x-ray showing cardiomegaly and increased vascular congestion right effusion, possible consolidation right base.  Awaiting radiology reading. [MB]  [id number] Discussed with Dr. Denton Brick from  [facility] hospitalist who will evaluate the patient for admission. [MB]    Clinical Course User Index [MB] Hayden Rasmussen, MD   MDM Rules/Calculators/A&P  [name] was evaluated in Emergency Department on 09/27/2020 for the symptoms described in the history of present illness. He was evaluated in the context of the global COVID-19 pandemic, which necessitated consideration that the patient might be at risk for infection with the SARS-CoV-2 virus that causes COVID-19. Institutional protocols and algorithms that pertain to the evaluation of patients at risk for COVID-19 are in a state of rapid change based on information released by regulatory bodies including the CDC and federal and state organizations. These policies and algorithms were followed during the patient's care in the ED.  This patient complains of dyspnea on exertion orthopnea; this involves an extensive number of treatment Options and is a complaint that carries with it a high risk of complications and Morbidity. The differential includes CHF, anemia, renal failure anemia Covid  I ordered, reviewed and interpreted labs, which included CBC with normal white count, heme globin similar to priors, chemistries with worsening creatinine, BNP elevated, troponins flat, Covid testing negative I ordered medication IV Lasix I ordered imaging studies which included chest x-ray and I independently    visualized and interpreted imaging which showed cardiomegaly, persistent consolidation right base Previous records obtained and reviewed in epic including recent admission in November for similar presentation I consulted Dr. Denton Brick [facility] hospital[facility] and discussed lab and imaging findings  Critical Interventions: None  After the interventions stated above, I reevaluated the patient and found patient still to be symptomatic.  He will need admission to the hospital for IV diuresis and monitoring of renal function.   Final  Clinical Impression(s) / ED Diagnoses Final diagnoses:  Acute congestive heart failure, unspecified heart failure type ([place])  AKI (acute kidney injury) ([place])    Rx / DC Orders ED Discharge Orders    None       Hayden Rasmussen, MD 09/28/20 1005

## 2020-09-27 NOTE — ED Triage Notes (Signed)
C/o shortness of breath, history of CHF

## 2020-09-27 NOTE — ED Notes (Signed)
Pitting Edema x 2  noted to bilateral lower legs.

## 2020-09-27 NOTE — TOC Initial Note (Signed)
Transition of Care [facility]) - Initial/Assessment Note    Patient Details  Name: [name] MRN: [id number] Date of Birth: 21-Nov-1965  [facility] CM/SW Contact:    Iona Beard, [place] Phone Number: 09/27/2020, 8:24 PM  Clinical Narrative:                 [facility] consulted for CHF screen. CSW spoke with pt to complete assessment. Pt states he lives with his wife. Pt states he is independent in completing ADLs and provides his own transportation. Pt states he has not had [facility] services and does not use any DME.   Pt states that he was weighing daily but started to slow down. CSW educated pt of importance daily weights and when to reach out to his cardiologist Dr. Harl Bowie. Pt states that he takes all of his medications as they are prescribed. Pt also states he follows a heart healthy diet. TOC to follow for possible d/c needs.   Expected Discharge Plan: Home/Self Care Barriers to Discharge: Continued Medical Work up   Patient Goals and CMS Choice Patient states their goals for this hospitalization and ongoing recovery are:: Return home CMS Medicare.gov Compare Post Acute Care list provided to:: Patient Choice offered to / list presented to : Patient  Expected Discharge Plan and Services Expected Discharge Plan: Home/Self Care In-house Referral: Clinical Social Work Discharge Planning Services: CM Consult Post Acute Care Choice: NA Living arrangements for the past 2 months: Single Family Home                 DME Arranged: N/A DME Agency: NA       HH Arranged: NA [place] Agency: NA        Prior Living Arrangements/Services Living arrangements for the past 2 months: Single Family Home Lives with:: Spouse Patient language and need for interpreter reviewed:: Yes Do you feel safe going back to the place where you live?: Yes      Need for Family Participation in Patient Care: No (Comment) Care giver support system in place?: Yes (comment)   Criminal Activity/Legal  Involvement Pertinent to Current Situation/Hospitalization: No - Comment as needed  Activities of Daily Living Home Assistive Devices/Equipment: None ADL Screening (condition at time of admission) Patient's cognitive ability adequate to safely complete daily activities?: Yes Is the patient deaf or have difficulty hearing?: No Does the patient have difficulty seeing, even when wearing glasses/contacts?: No Does the patient have difficulty concentrating, remembering, or making decisions?: No Patient able to express need for assistance with ADLs?: No Does the patient have difficulty dressing or bathing?: No Independently performs ADLs?: Yes (appropriate for developmental age) Does the patient have difficulty walking or climbing stairs?: No Weakness of Legs: None Weakness of Arms/Hands: None  Permission Sought/Granted                  Emotional Assessment Appearance:: Appears stated age Attitude/Demeanor/Rapport: Engaged Affect (typically observed): Accepting Orientation: : Oriented to Self,Oriented to Place,Oriented to  Time,Oriented to Situation Alcohol / Substance Use: Not Applicable Psych Involvement: No (comment)  Admission diagnosis:  AKI (acute kidney injury) ([place]) [N17.9] Acute congestive heart failure, unspecified heart failure type ([place]) [I50.9] Decompensated heart failure ([place]) [I50.9] Patient Active Problem List   Diagnosis Date Noted  . Uncontrolled type 2 diabetes mellitus with hyperglycemia ([place]) 06/16/2020  . Community acquired pneumonia of right lower lobe of lung   . CHF exacerbation ([place]) 06/14/2020  . Protein calorie malnutrition ([place]) 06/14/2020  . CKD (  chronic kidney disease) stage 3, GFR 30-59 ml/min (HCC) 05/03/2020  . Hypertensive urgency 05/03/2020  . Blurred vision, bilateral 05/03/2020  . Acute on chronic diastolic (congestive) heart failure ([place]) 05/03/2020  . Essential hypertension   . Type 2 diabetes with nephropathy ([place])   . Acute on chronic  diastolic CHF (congestive heart failure) ([place])   . Acute renal failure superimposed on stage 3a chronic kidney disease ([place])   . Class 2 obesity   . Bilateral leg edema 04/05/2020   PCP:  [name], MD Pharmacy:   [place], [place] - [place] [address] [place] [address] [place] [address] Phone: [phone] Fax: [phone]     Social Determinants of Health (SDOH) Interventions    Readmission Risk Interventions Readmission Risk Prevention Plan 09/27/2020  Transportation Screening Complete  Home Care Screening Complete  Medication Review (RN CM) Complete  Some recent data might be hidden

## 2020-09-27 NOTE — H&P (Addendum)
History and Physical    [name] HLK:[id number] DOB: [birth year] DOA: 09/27/2020  PCP: [name], MD   Patient coming from: Home  I have personally briefly reviewed patient's old medical records in [place]  Chief Complaint: Difficulty breathing  HPI: [name] is a 55 y.o. male with medical history significant for hypertension, diabetes mellitus, diastolic CHF, CKD 3. Patient presented to the ED with complaints of difficulty breathing, leg swelling and weight gain.  He was admitted in November for decompensated CHF and he felt better after that admission, but reports that he is breathing has gradually  worsened.  He has had to increase his number of pillows from 2-3.  He feels like he is choking or getting strangled when he is lying back.  Last admission weight was 272, and he was diuresed to his discharge weight of 260.  Today per our charts his weight is 298 pounds.  No chest pain.  He reports compliance with his torsemide 40 mg twice a day.  He denies dietary indiscretion.  ED Course: O2 sats 90 to 96% on room air.  Respiratory rate 18-25.  Temperature 97.9.  Heart rate 80s.  Stable hemoglobin at 9.4.  WBC 5.3.  Troponin 11.  BNP 219.  Creatinine elevated at 2.9. Chest x-ray shows right base consolidation, chronic cough central vascular congestion.  80 mg IV Lasix given.  Hospitalist to admit.  Review of Systems: As per HPI all other systems reviewed and negative.  Past Medical History:  Diagnosis Date  . CHF (congestive heart failure) ([place])   . Diabetes mellitus   . Hypertension     Past Surgical History:  Procedure Laterality Date  . INCISION AND DRAINAGE    . KNEE SURGERY       reports that he has never smoked. He has never used smokeless tobacco. He reports that he does not drink alcohol and does not use drugs.  No Known Allergies  Family History  Problem Relation Age of Onset  . Dementia Mother   . Diabetes Father     Prior to Admission  medications   Medication Sig Start Date End Date Taking? Authorizing Provider  amLODipine (NORVASC) 10 MG tablet Take 1 tablet (10 mg total) by mouth daily. 04/28/20  Yes Imogene Burn, PA-C  glimepiride (AMARYL) 4 MG tablet Take 2 mg by mouth in the morning and at bedtime.   Yes [provider]  hydrALAZINE (APRESOLINE) 100 MG tablet Take 1 tablet (100 mg total) by mouth 3 (three) times daily. 04/10/20  Yes Barton Dubois, MD  labetalol (NORMODYNE) 200 MG tablet Take 1 tablet (200 mg total) by mouth 2 (two) times daily. 04/28/20  Yes Imogene Burn, PA-C  metFORMIN (GLUCOPHAGE-XR) 500 MG 24 hr tablet Take 2 tablets (1,000 mg total) by mouth in the morning and at bedtime. 04/10/20  Yes Barton Dubois, MD  Multiple Vitamin (MULTIVITAMIN WITH MINERALS) TABS tablet Take 1 tablet by mouth daily.   Yes [provider]  pravastatin (PRAVACHOL) 40 MG tablet Take 40 mg by mouth in the morning.    Yes [provider]  torsemide (DEMADEX) 20 MG tablet Take 2 tablets (40 mg total) by mouth 2 (two) times daily. 06/22/20  Deveron Furlong, MD    Physical Exam: Vitals:   09/27/20 1600 09/27/20 1630 09/27/20 1700 09/27/20 1730  BP: 129/68 121/66 (!) 141/81 125/79  Pulse: 84 85 83 81  Resp: (!) 25 (!) 25 (!) 24 20  Temp:      TempSrc:      SpO2: 97% 93% 95% 96%  Weight:      Height:        Constitutional: NAD, calm, comfortable Vitals:   09/27/20 1600 09/27/20 1630 09/27/20 1700 09/27/20 1730  BP: 129/68 121/66 (!) 141/81 125/79  Pulse: 84 85 83 81  Resp: (!) 25 (!) 25 (!) 24 20  Temp:      TempSrc:      SpO2: 97% 93% 95% 96%  Weight:      Height:       Eyes: PERRL, lids and conjunctivae normal ENMT: Mucous membranes are moist. Posterior pharynx clear of any exudate or lesions.  Neck: normal, supple, no masses, no thyromegaly Respiratory: Reduced breath sounds right base, no wheezing or crackles appreciated,  Normal respiratory effort. No accessory muscle use.   Cardiovascular: Regular rate and rhythm, at least 2+ pitting extremity edema to knees. 2+ pedal pulses.  Abdomen: full, no tenderness, no masses palpated.  Musculoskeletal: no clubbing / cyanosis. No joint deformity upper and lower extremities. Good ROM, no contractures. Normal muscle tone.  Skin: no rashes, lesions, ulcers. No induration Neurologic: No apparent cranial abnormality, moving extremities spontaneously Psychiatric: Normal judgment and insight. Alert and oriented x 3. Normal mood.   Labs on Admission: I have personally reviewed following labs and imaging studies  CBC: Recent Labs  Lab 09/27/20 1530  WBC 5.3  HGB 9.4*  HCT 29.3*  MCV 85.9  PLT 093   Basic Metabolic Panel: Recent Labs  Lab 09/27/20 1530  NA 140  K 4.9  CL 110  CO2 22  GLUCOSE 73  BUN 57*  CREATININE 2.90*  CALCIUM 8.2*  MG 1.8    Radiological Exams on Admission: DG Chest Portable 1 View  Result Date: 09/27/2020 CLINICAL DATA:  Short of breath for several days, diabetes, hypertension EXAM: PORTABLE CHEST 1 VIEW COMPARISON:  06/15/2020 FINDINGS: Single frontal view of the chest demonstrates stable enlargement of the cardiac silhouette. There is persistent right basilar consolidation and small right pleural effusion, with little change since the preceding exam. Mild chronic central vascular congestion. No pneumothorax. IMPRESSION: 1. Persistent right basilar consolidation and small right pleural effusion. 2. Chronic central vascular congestion. 3. Persistent enlargement the cardiac silhouette. Electronically Signed   By: Randa Ngo M.D.   On: 09/27/2020 15:42    EKG: Independently reviewed.  Sinus rhythm, rate 84.  QTc 480.  No significant change from prior EKG.  Assessment/Plan Principal Problem:   Acute on chronic diastolic (congestive) heart failure (HCC) Active Problems:   Acute renal failure superimposed on stage 3a chronic kidney disease (HCC)   Essential hypertension   Type 2  diabetes with nephropathy (HCC)   Class 2 obesity   CKD (chronic kidney disease) stage 3, GFR 30-59 ml/min (HCC)   Acute on chronic diastolic CHF-obviously volume overloaded, with chart suggesting a 38 pound weight gain since last discharge November.  O2 sats 90 - 96% on room air.  BNP 210- stable/slightly improved compared to prior.  X-ray showing persistent right basilar consolidation and small right pleural effusion, chronic central vascular congestion.  Afebrile without leukocytosis.  Patient is on torsemide 40 mg twice daily-compliance, and denies dietary indiscretion.  - Last Echo 03/2020, EF 50% with grade 2 diastolic dysfunction.  -  Troponin 11, trend - IV Lasix 80 mg x 1 given, continue 60 twice daily  - Strict input output, daily weights - BMP daily  Acute on  chronic renal failure stage IIIb.  Creatinine 2.9, last check 3 weeks ago creatinine was 1.9.  Likely cardiorenal. -Monitor with diureses  Diabetes mellitus-random glucose 73. HgbA1c-6.9. -Hold glimepiride and Metformin especially with worsening renal insufficiency, may need dose modification on discharge - SSI- M  Hypertension-stable. -Resume Norvasc, labetalol, hydralazine  Chronic anemia hemoglobin stable 9.4.  Anemia panel 2/22 suggests iron deficiency anemia.  DVT prophylaxis: Lovenox Code Status: Full code Family Communication: None at bedside. Disposition Plan:  > 2 days, pending improvement in volume status with diuresis. Consults called: None Admission status: Inpt,Tele I certify that at the point of admission it is my clinical judgment that the patient will require inpatient hospital care spanning beyond 2 midnights from the point of admission due to high intensity of service, high risk for further deterioration and high frequency of surveillance required.    Bethena Roys MD [facility]  09/27/2020, 6:50 PM

## 2020-09-28 LAB — GLUCOSE, CAPILLARY
Glucose-Capillary: 56 mg/dL — ABNORMAL LOW (ref 70–99)
Glucose-Capillary: 138 mg/dL — ABNORMAL HIGH (ref 70–99)
Glucose-Capillary: 112 mg/dL — ABNORMAL HIGH (ref 70–99)
Glucose-Capillary: 97 mg/dL (ref 70–99)

## 2020-09-28 LAB — BASIC METABOLIC PANEL
Anion gap: 8 (ref 5–15)
BUN: 57 mg/dL — ABNORMAL HIGH (ref 6–20)
CO2: 24 mmol/L (ref 22–32)
Calcium: 8.2 mg/dL — ABNORMAL LOW (ref 8.9–10.3)
Chloride: 110 mmol/L (ref 98–111)
Creatinine, Ser: 2.86 mg/dL — ABNORMAL HIGH (ref 0.61–1.24)
GFR, Estimated: 25 mL/min — ABNORMAL LOW (ref >60)
Glucose, Bld: 61 mg/dL — ABNORMAL LOW (ref 70–99)
Potassium: 4.7 mmol/L (ref 3.5–5.1)
Sodium: 142 mmol/L (ref 135–145)

## 2020-09-28 NOTE — Progress Notes (Signed)
Inpatient Diabetes Program Recommendations  AACE/ADA: New Consensus Statement on Inpatient Glycemic Control (2015)  Target Ranges:  Prepandial:   less than 140 mg/dL      Peak postprandial:   less than 180 mg/dL (1-2 hours)      Critically ill patients:  140 - 180 mg/dL   Lab Results  Component Value Date   GLUCAP 56 (L) 09/28/2020   HGBA1C 6.9 (H) 09/06/2020    Review of Glycemic Control Results for [name], [name] (MRN [id number]) as of 09/28/2020 10:21  Ref. Range 09/27/2020 21:27 09/28/2020 07:24  Glucose-Capillary Latest Ref Range: 70 - 99 mg/dL 122 (H) 56 (L)   Diabetes history: DM 2 Outpatient Diabetes medications: Metformin 1000 mg bid, Amaryl 2 mg bid Current orders for Inpatient glycemic control:  Novolog 0-15 units tid + hs  A1c 6.9% on 2/14  Inpatient Diabetes Program Recommendations:    NOTE: elevated renal function, hypoglycemia without insulin administration  -  Consider reducing Novolog Correction scale to "very sensitive" 0-6 units tid.  Thanks, Tama Headings RN, MSN, BC-ADM Inpatient Diabetes Coordinator Team Pager [phone] (8a-5p)

## 2020-09-28 NOTE — Progress Notes (Signed)
PROGRESS NOTE    Patient: [name]                            PCP: [name], MD                    DOB: 04-12-1966            DOA: 09/27/2020 XTK:[id number]             DOS: 09/28/2020, 12:43 PM   LOS: 1 day   Date of Service: The patient was seen and examined on 09/28/2020  Subjective:   The patient was seen and examined this morning. Currently sitting in chair, noting that his legs are extensively swollen. Reporting with minimal exertion gets significant shortness of breath unable to take few steps But has improved since yesterday  Denies of having any chest pain.  Brief Narrative:   [name] is a 55 y.o. male with medical history significant for hypertension, diabetes mellitus, diastolic CHF, CKD 3. Patient presented to the ED with complaints of difficulty breathing, leg swelling and weight gain.  He was admitted in November for decompensated CHF and he felt better after that admission, but reports that he is breathing has gradually  worsened.  He has had to increase his number of pillows from 2-3.  He feels like he is choking or getting strangled when he is lying back.  Last admission weight was 272, and he was diuresed to his discharge weight of 260.  Today per our charts his weight is 298 pounds.  No chest pain.  He reports compliance with his torsemide 40 mg twice a day.  He denies dietary indiscretion.  ED Course:  O2 sats 90 to 96% on room air.  Respiratory rate 18-25.  Temperature 97.9.  Heart rate 80s.  Stable hemoglobin at 9.4.  WBC 5.3.  Troponin 11.  BNP 219.  Creatinine elevated at 2.9. Chest x-ray shows right base consolidation, chronic cough central vascular congestion.  80 mg IV Lasix given.  Hospitalist to admit.   Assessment & Plan:   Principal Problem:   Acute on chronic diastolic (congestive) heart failure (HCC) Active Problems:   Essential hypertension   Type 2 diabetes with nephropathy (HCC)   Acute renal failure superimposed on stage 3a  chronic kidney disease (HCC)   Class 2 obesity   CKD (chronic kidney disease) stage 3, GFR 30-59 ml/min (HCC)   Acute on chronic diastolic CHF -Still complaining of severe dyspnea with minimal exertion -Still having 4+ pitting edema lower extremities -Diuresing well on IV Lasix I's /  O's  >> - 1700 Monitoring daily weight  - volume overloaded, with chart suggesting a 38 pound weight gain since last discharge November.  O2 sats 90 - 96% on room air.  - BNP 210- stable/slightly improved compared to prior.   -CX-ray showing persistent right basilar consolidation and small right pleural effusion, chronic central vascular congestion.   -No signs of infection he remains afebrile, with a normal WBCs --withholding antibiotics  - Patient is on torsemide 40 mg twice daily-compliance, and denies dietary indiscretion.  - Last Echo 03/2020, EF 50% with grade 2 diastolic dysfunction.  - IV Lasix 80 mg x 1 given, continue 60 twice daily  -We will continue IV Lasix  twice daily, diuresing well... We will continue to monitor closely  Acute on chronic renal failure stage IIIb.   Creatinine 2.90 (last check 3  weeks ago creatinine was 1.9)  >>> 2.86 -Likely cardiorenal. -Monitor with diureses -Avoiding nephrotoxins  Diabetes mellitus -random glucose 73. HgbA1c-6.9. -We will continue to hold  glimepiride and Metformin  -Checking CBG QA CHS, with SSI coverage   Hypertension -stable. -Resume Norvasc, labetalol, hydralazine  Chronic anemia hemoglobin stable 9.4.  Anemia panel 2/22 suggests iron deficiency anemia.    ------------------------------------------------------------------------------------------------------------------------------- Nutritional status:  The patient's BMI is: Body mass index is 40.43 kg/m. I agree with the assessment and plan as outlined    Skin Assessment: I have examined the patient's skin and I agree with the wound assessment as performed by wound care  team As outlined  Cultures; NONE   Antimicrobials: None   DVT prophylaxis: Lovenox Code Status: Full code Family Communication: None at bedside. Disposition Plan:  1-2  days, pending improvement in volume status with diuresis. Consults called: None Admission status: Inpt,Tele      Level of care: Telemetry   Procedures:   No admission procedures for hospital encounter.     Antimicrobials:  Anti-infectives (From admission, onward)   None       Medication:  . amLODipine  10 mg Oral Daily  . furosemide  60 mg Intravenous Q12H  . heparin injection (subcutaneous)  5,000 Units Subcutaneous Q8H  . hydrALAZINE  100 mg Oral TID  . insulin aspart  0-15 Units Subcutaneous TID WC  . insulin aspart  0-5 Units Subcutaneous QHS  . labetalol  200 mg Oral BID  . pravastatin  40 mg Oral Daily    acetaminophen **OR** acetaminophen, ondansetron **OR** ondansetron (ZOFRAN) IV, polyethylene glycol   Objective:   Vitals:   09/27/20 2032 09/28/20 0216 09/28/20 0500 09/28/20 1010  BP: (!) 154/78 (!) 143/84  (!) 149/76  Pulse: 85 85  85  Resp: 18 18  18   Temp: 98.4 F (36.9 C) 98.6 F (37 C)  98.5 F (36.9 C)  TempSrc: Oral Oral  Oral  SpO2: 93% 91%  94%  Weight: 133.8 kg  131.5 kg   Height: 5\' 11"  (1.803 m)       Intake/Output Summary (Last 24 hours) at 09/28/2020 1243 Last data filed at 09/28/2020 2458 Gross per 24 hour  Intake 480 ml  Output 1700 ml  Net -1220 ml   Filed Weights   09/27/20 1508 09/27/20 2032 09/28/20 0500  Weight: 135.3 kg 133.8 kg 131.5 kg     Examination:   Physical Exam  Constitution:  Alert, cooperative, no distress,  Appears calm and comfortable in chair With exertion complaining of progressive shortness of breath   HEENT: Normocephalic, PERRL, otherwise with in Normal limits  Chest:Chest symmetric Cardio vascular:  S1/S2, RRR, No murmure, No Rubs or Gallops  pulmonary: Crackles/rhonchi lower to mid lobes, no open wheezes, positive  breath sounds Abdomen: Soft, non-tender, non-distended, bowel sounds,no masses, no organomegaly Muscular skeletal: Limited exam - in bed, able to move all 4 extremities, Normal strength,  Neuro: CNII-XII intact. , normal motor and sensation, reflexes intact  Extremities: +4  pitting edema lower extremities, +2 pulses  Skin: Dry, warm to touch, negative for any Rashes, No open wounds Wounds: per nursing documentation    ------------------------------------------------------------------------------------------------------------------------------------------    LABs:  CBC Latest Ref Rng & Units 09/27/2020 09/06/2020 06/16/2020  WBC 4.0 - 10.5 K/uL 5.3 4.9 4.7  Hemoglobin 13.0 - 17.0 g/dL 9.4(L) 10.8(L) 9.6(L)  Hematocrit 39.0 - 52.0 % 29.3(L) 33.1(L) 30.0(L)  Platelets 150 - 400 K/uL 261 298 287   CMP Latest Ref  Rng & Units 09/28/2020 09/27/2020 09/06/2020  Glucose 70 - 99 mg/dL 61(L) 73 133(H)  BUN 6 - 20 mg/dL 57(H) 57(H) 47(H)  Creatinine 0.61 - 1.24 mg/dL 2.86(H) 2.90(H) 1.95(H)  Sodium 135 - 145 mmol/L 142 140 138  Potassium 3.5 - 5.1 mmol/L 4.7 4.9 5.0  Chloride 98 - 111 mmol/L 110 110 109  CO2 22 - 32 mmol/L 24 22 23   Calcium 8.9 - 10.3 mg/dL 8.2(L) 8.2(L) 8.7(L)  Total Protein 6.5 - 8.1 g/dL - - 6.9  Total Bilirubin 0.3 - 1.2 mg/dL - - 0.7  Alkaline Phos 38 - 126 U/L - - 79  AST 15 - 41 U/L - - 18  ALT 0 - 44 U/L - - 19       Micro Results Recent Results (from the past 240 hour(s))  Resp Panel by RT-PCR (Flu A&B, Covid) Nasopharyngeal Swab     Status: None   Collection Time: 09/27/20  7:01 PM   Specimen: Nasopharyngeal Swab; Nasopharyngeal(NP) swabs in vial transport medium  Result Value Ref Range Status   SARS Coronavirus 2 by RT PCR NEGATIVE NEGATIVE Final    Comment: (NOTE) SARS-CoV-2 target nucleic acids are NOT DETECTED.  The SARS-CoV-2 RNA is generally detectable in upper respiratory specimens during the acute phase of infection. The lowest concentration of  SARS-CoV-2 viral copies this assay can detect is 138 copies/mL. A negative result does not preclude SARS-Cov-2 infection and should not be used as the sole basis for treatment or other patient management decisions. A negative result may occur with  improper specimen collection/handling, submission of specimen other than nasopharyngeal swab, presence of viral mutation(s) within the areas targeted by this assay, and inadequate number of viral copies(<138 copies/mL). A negative result must be combined with clinical observations, patient history, and epidemiological information. The expected result is Negative.  Fact Sheet for Patients:  EntrepreneurPulse.com.au  Fact Sheet for Healthcare Providers:  IncredibleEmployment.be  This test is no t yet approved or cleared by the [place] FDA and  has been authorized for detection and/or diagnosis of SARS-CoV-2 by FDA under an Emergency Use Authorization (EUA). This EUA will remain  in effect (meaning this test can be used) for the duration of the COVID-19 declaration under Section 564(b)(1) of the Act, 21 U.S.C.section 360bbb-3(b)(1), unless the authorization is terminated  or revoked sooner.       Influenza A by PCR NEGATIVE NEGATIVE Final   Influenza B by PCR NEGATIVE NEGATIVE Final    Comment: (NOTE) The Xpert Xpress SARS-CoV-2/FLU/RSV plus assay is intended as an aid in the diagnosis of influenza from Nasopharyngeal swab specimens and should not be used as a sole basis for treatment. Nasal washings and aspirates are unacceptable for Xpert Xpress SARS-CoV-2/FLU/RSV testing.  Fact Sheet for Patients: EntrepreneurPulse.com.au  Fact Sheet for Healthcare Providers: IncredibleEmployment.be  This test is not yet approved or cleared by the [place] FDA and has been authorized for detection and/or diagnosis of SARS-CoV-2 by FDA under an Emergency Use  Authorization (EUA). This EUA will remain in effect (meaning this test can be used) for the duration of the COVID-19 declaration under Section 564(b)(1) of the Act, 21 U.S.C. section 360bbb-3(b)(1), unless the authorization is terminated or revoked.  Performed at [facility], [address] [place], [place] [address]     Radiology Reports DG Chest Portable 1 View  Result Date: 09/27/2020 CLINICAL DATA:  Short of breath for several days, diabetes, hypertension EXAM: PORTABLE CHEST 1 VIEW COMPARISON:  06/15/2020 FINDINGS:  Single frontal view of the chest demonstrates stable enlargement of the cardiac silhouette. There is persistent right basilar consolidation and small right pleural effusion, with little change since the preceding exam. Mild chronic central vascular congestion. No pneumothorax. IMPRESSION: 1. Persistent right basilar consolidation and small right pleural effusion. 2. Chronic central vascular congestion. 3. Persistent enlargement the cardiac silhouette. Electronically Signed   By: Randa Ngo M.D.   On: 09/27/2020 15:42    SIGNED: Deatra James, MD, FHM. [facility],  Pager (please use [email or web address] to page/text) Please use Epic Secure Chat for non-urgent communication (7AM-7PM)  If 7PM-7AM, please contact night-coverage [email or web address], 09/28/2020, 12:43 PM

## 2020-09-29 LAB — GLUCOSE, CAPILLARY
Glucose-Capillary: 84 mg/dL (ref 70–99)
Glucose-Capillary: 137 mg/dL — ABNORMAL HIGH (ref 70–99)
Glucose-Capillary: 110 mg/dL — ABNORMAL HIGH (ref 70–99)
Glucose-Capillary: 131 mg/dL — ABNORMAL HIGH (ref 70–99)

## 2020-09-29 LAB — BRAIN NATRIURETIC PEPTIDE: B Natriuretic Peptide: 185 pg/mL — ABNORMAL HIGH (ref 0.0–100.0)

## 2020-09-29 MED ORDER — SODIUM CHLORIDE 0.9 % IV SOLN
500.0000 mg | INTRAVENOUS | Status: DC
  Administered 2020-09-29 – 2020-09-30 (×2): 500 mg via INTRAVENOUS
  Filled 2020-09-29 (×2): qty 500

## 2020-09-29 MED ORDER — SODIUM CHLORIDE 0.9 % IV SOLN
INTRAVENOUS | Status: DC | PRN
  Administered 2020-09-29: 250 mL via INTRAVENOUS

## 2020-09-29 MED ORDER — SODIUM CHLORIDE 0.9 % IV SOLN
1.0000 g | INTRAVENOUS | Status: DC
  Administered 2020-09-29 – 2020-09-30 (×2): 1 g via INTRAVENOUS
  Filled 2020-09-29 (×2): qty 10

## 2020-09-29 NOTE — Progress Notes (Signed)
PROGRESS NOTE    Patient: [name]                            PCP: [name], MD                    DOB: 1966/05/27            DOA: 09/27/2020 [id number]             DOS: 09/29/2020, 4:48 PM   LOS: 2 days   Date of Service: The patient was seen and examined on 09/29/2020  Subjective:   The patient was seen and examined this morning. Currently sitting in chair, noting that his legs are extensively swollen. Reporting with minimal exertion gets significant shortness of breath unable to take few steps But has improved since yesterday  Denies of having any chest pain.  Brief Narrative:   [name] is a 55 y.o. male with medical history significant for hypertension, diabetes mellitus, diastolic CHF, CKD 3. Patient presented to the ED with complaints of difficulty breathing, leg swelling and weight gain.  He was admitted in November for decompensated CHF and he felt better after that admission, but reports that he is breathing has gradually  worsened.  He has had to increase his number of pillows from 2-3.  He feels like he is choking or getting strangled when he is lying back.  Last admission weight was 272, and he was diuresed to his discharge weight of 260.  Today per our charts his weight is 298 pounds.  No chest pain.  He reports compliance with his torsemide 40 mg twice a day.  He denies dietary indiscretion.  ED Course:  O2 sats 90 to 96% on room air.  Respiratory rate 18-25.  Temperature 97.9.  Heart rate 80s.  Stable hemoglobin at 9.4.  WBC 5.3.  Troponin 11.  BNP 219.  Creatinine elevated at 2.9. Chest x-ray shows right base consolidation, chronic cough central vascular congestion.  80 mg IV Lasix given.  Hospitalist to admit.   Assessment & Plan:   Principal Problem:   Acute on chronic diastolic (congestive) heart failure (HCC) Active Problems:   Essential hypertension   Type 2 diabetes with nephropathy (HCC)   Acute renal failure superimposed on stage 3a  chronic kidney disease (HCC)   Class 2 obesity   CKD (chronic kidney disease) stage 3, GFR 30-59 ml/min (HCC)   1)Acute on chronic diastolic CHF -DOE persist Echo from 03/2020 - with EF of 50 % and Grade 2 diastolic CHF -Still having 4+ pitting edema lower extremities -Continue IV Lasix at 60 mg every 12 hours -Wt is down to 282 pounds from 298 pounds on admission -Patient had gained almost 40 pounds PTA -Recheck chest x-ray on 09/30/2020 - patient is morbidly obese with diastolic dysfunction CHF so BNP may not be particularly elevated   2)Possible concomitant CAP--- treat empirically with Rocephin and azithromycin, recheck chest x-ray on 09/30/2020   3)Acute on chronic renal failure stage IIIb.   Creatinine 2.90 (last check 3 weeks ago creatinine was 1.9)  >>> 2.86 -Likely cardiorenal. - renally adjust medications, avoid nephrotoxic agents / dehydration  / hypotension  4)Diabetes mellitus - HgbA1c-6.9 reflecting excellent diabetic control PTA -We will continue to hold  glimepiride and Metformin  Use Novolog/Humalog Sliding scale insulin with Accu-Cheks/Fingersticks as ordered   5)Hypertension -stable. -Resume Norvasc, labetalol, hydralazine  6)Chronic anemia hemoglobin stable 9.4.  Anemia panel 2/22 suggests iron deficiency anemia. -Outpatient work-up including endoluminal evaluation as outpatient when cardiopulmonary status is more stable   ------------------------------------------------------------------------------------------------------------------------------- Nutritional status:  The patient's BMI is: Body mass index is 39.33 kg/m. I agree with the assessment and plan as outlined   Cultures; NONE   Antimicrobials: -Rocephin and azithromycin started 09/29/2020  DVT prophylaxis: Lovenox Code Status: Full code Family Communication: None at bedside. Disposition Plan:  1-2  days, pending improvement in volume status with diuresis. Consults called: None Admission  status: Inpt,Tele      Level of care: Telemetry   Procedures:   No admission procedures for hospital encounter.     Antimicrobials:  Anti-infectives (From admission, onward)   Start     Dose/Rate Route Frequency Ordered Stop   09/29/20 1745  cefTRIAXone (ROCEPHIN) 1 g in sodium chloride 0.9 % 100 mL IVPB        1 g 200 mL/hr over 30 Minutes Intravenous Every 24 hours 09/29/20 1648     09/29/20 1745  azithromycin (ZITHROMAX) 500 mg in sodium chloride 0.9 % 250 mL IVPB        500 mg 250 mL/hr over 60 Minutes Intravenous Every 24 hours 09/29/20 1648         Medication:  . amLODipine  10 mg Oral Daily  . furosemide  60 mg Intravenous Q12H  . heparin injection (subcutaneous)  5,000 Units Subcutaneous Q8H  . hydrALAZINE  100 mg Oral TID  . insulin aspart  0-15 Units Subcutaneous TID WC  . insulin aspart  0-5 Units Subcutaneous QHS  . labetalol  200 mg Oral BID  . pravastatin  40 mg Oral Daily    acetaminophen **OR** acetaminophen, ondansetron **OR** ondansetron (ZOFRAN) IV, polyethylene glycol   Objective:   Vitals:   09/28/20 2101 09/29/20 0500 09/29/20 0529 09/29/20 1047  BP: (!) 168/92  (!) 154/82 (!) 144/75  Pulse: 83  88 81  Resp: 20  20 20   Temp: 98.7 F (37.1 C)  99.3 F (37.4 C)   TempSrc: Oral  Oral   SpO2: 95%  92% 97%  Weight:  127.9 kg    Height:        Intake/Output Summary (Last 24 hours) at 09/29/2020 1648 Last data filed at 09/29/2020 1453 Gross per 24 hour  Intake 960 ml  Output 2725 ml  Net -1765 ml   Filed Weights   09/27/20 2032 09/28/20 0500 09/29/20 0500  Weight: 133.8 kg 131.5 kg 127.9 kg     Examination:   Physical Exam  Constitution:  Alert, cooperative,   HEENT: Normocephalic, PERRL, otherwise with in Normal limits  Chest/Lungs : Diminished breath sounds, very faint bibasilar rales bilateral  cardio vascular:  S1/S2, RRR, No murmure, No Rubs or Gallops  Abdomen: Soft, non-tender, non-distended, bowel sounds, Neuro:  Generalized weakness, no new focal deficit  Extremities: +4  pitting edema lower extremities, +2 pulses  Skin: Dry, warm to touch, negative for any Rashes, No open wounds   LABs:  CBC Latest Ref Rng & Units 09/27/2020 09/06/2020 06/16/2020  WBC 4.0 - 10.5 K/uL 5.3 4.9 4.7  Hemoglobin 13.0 - 17.0 g/dL 9.4(L) 10.8(L) 9.6(L)  Hematocrit 39.0 - 52.0 % 29.3(L) 33.1(L) 30.0(L)  Platelets 150 - 400 K/uL 261 298 287   CMP Latest Ref Rng & Units 09/28/2020 09/27/2020 09/06/2020  Glucose 70 - 99 mg/dL 61(L) 73 133(H)  BUN 6 - 20 mg/dL 57(H) 57(H) 47(H)  Creatinine 0.61 - 1.24 mg/dL 2.86(H) 2.90(H) 1.95(H)  Sodium  135 - 145 mmol/L 142 140 138  Potassium 3.5 - 5.1 mmol/L 4.7 4.9 5.0  Chloride 98 - 111 mmol/L 110 110 109  CO2 22 - 32 mmol/L 24 22 23   Calcium 8.9 - 10.3 mg/dL 8.2(L) 8.2(L) 8.7(L)  Total Protein 6.5 - 8.1 g/dL - - 6.9  Total Bilirubin 0.3 - 1.2 mg/dL - - 0.7  Alkaline Phos 38 - 126 U/L - - 79  AST 15 - 41 U/L - - 18  ALT 0 - 44 U/L - - 19    Micro Results Recent Results (from the past 240 hour(s))  Resp Panel by RT-PCR (Flu A&B, Covid) Nasopharyngeal Swab     Status: None   Collection Time: 09/27/20  7:01 PM   Specimen: Nasopharyngeal Swab; Nasopharyngeal(NP) swabs in vial transport medium  Result Value Ref Range Status   SARS Coronavirus 2 by RT PCR NEGATIVE NEGATIVE Final    Comment: (NOTE) SARS-CoV-2 target nucleic acids are NOT DETECTED.  The SARS-CoV-2 RNA is generally detectable in upper respiratory specimens during the acute phase of infection. The lowest concentration of SARS-CoV-2 viral copies this assay can detect is 138 copies/mL. A negative result does not preclude SARS-Cov-2 infection and should not be used as the sole basis for treatment or other patient management decisions. A negative result may occur with  improper specimen collection/handling, submission of specimen other than nasopharyngeal swab, presence of viral mutation(s) within the areas targeted by this  assay, and inadequate number of viral copies(<138 copies/mL). A negative result must be combined with clinical observations, patient history, and epidemiological information. The expected result is Negative.  Fact Sheet for Patients:  EntrepreneurPulse.com.au  Fact Sheet for Healthcare Providers:  IncredibleEmployment.be  This test is no t yet approved or cleared by the [place] FDA and  has been authorized for detection and/or diagnosis of SARS-CoV-2 by FDA under an Emergency Use Authorization (EUA). This EUA will remain  in effect (meaning this test can be used) for the duration of the COVID-19 declaration under Section 564(b)(1) of the Act, 21 U.S.C.section 360bbb-3(b)(1), unless the authorization is terminated  or revoked sooner.       Influenza A by PCR NEGATIVE NEGATIVE Final   Influenza B by PCR NEGATIVE NEGATIVE Final    Comment: (NOTE) The Xpert Xpress SARS-CoV-2/FLU/RSV plus assay is intended as an aid in the diagnosis of influenza from Nasopharyngeal swab specimens and should not be used as a sole basis for treatment. Nasal washings and aspirates are unacceptable for Xpert Xpress SARS-CoV-2/FLU/RSV testing.  Fact Sheet for Patients: EntrepreneurPulse.com.au  Fact Sheet for Healthcare Providers: IncredibleEmployment.be  This test is not yet approved or cleared by the [place] FDA and has been authorized for detection and/or diagnosis of SARS-CoV-2 by FDA under an Emergency Use Authorization (EUA). This EUA will remain in effect (meaning this test can be used) for the duration of the COVID-19 declaration under Section 564(b)(1) of the Act, 21 U.S.C. section 360bbb-3(b)(1), unless the authorization is terminated or revoked.  Performed at [facility], [address], [place],  [address]     Radiology Reports DG Chest Portable 1 View  Result Date: 09/27/2020 CLINICAL DATA:   Short of breath for several days, diabetes, hypertension EXAM: PORTABLE CHEST 1 VIEW COMPARISON:  06/15/2020 FINDINGS: Single frontal view of the chest demonstrates stable enlargement of the cardiac silhouette. There is persistent right basilar consolidation and small right pleural effusion, with little change since the preceding exam. Mild chronic central vascular congestion. No pneumothorax.  IMPRESSION: 1. Persistent right basilar consolidation and small right pleural effusion. 2. Chronic central vascular congestion. 3. Persistent enlargement the cardiac silhouette. Electronically Signed   By: Randa Ngo M.D.   On: 09/27/2020 15:42    SIGNED: Roxan Hockey, MD,  [facility],  Pager (please use [email or web address] to page/text) Please use Epic Secure Chat for non-urgent communication (7AM-7PM)  If 7PM-7AM, please contact night-coverage [email or web address], 09/29/2020, 4:48 PM

## 2020-09-29 NOTE — Progress Notes (Signed)
Pt's bilateral lower extremities wrapped with ace elastic bandages (with webril padding for comfort) per MD order as compression to relieve edema in lower legs and feet. Pt advised to notify staff if any pain, numbness/tingling, loss of sensation or color change in toes, as this may indicate dressings are too tight and need to be loosened. Pt stated understanding. Pt in bed, foot of bed elevated to assist in decreasing of swelling as well.

## 2020-09-29 NOTE — Plan of Care (Signed)

## 2020-09-30 ENCOUNTER — Ambulatory Visit: Payer: Commercial Managed Care - PPO | Admitting: Cardiology

## 2020-09-30 ENCOUNTER — Inpatient Hospital Stay (HOSPITAL_COMMUNITY): Payer: Commercial Managed Care - PPO

## 2020-09-30 LAB — CBC
HCT: 31.6 % — ABNORMAL LOW (ref 39.0–52.0)
Hemoglobin: 10.2 g/dL — ABNORMAL LOW (ref 13.0–17.0)
MCH: 27.4 pg (ref 26.0–34.0)
MCHC: 32.3 g/dL (ref 30.0–36.0)
MCV: 84.9 fL (ref 80.0–100.0)
Platelets: 272 10*3/uL (ref 150–400)
RBC: 3.72 MIL/uL — ABNORMAL LOW (ref 4.22–5.81)
RDW: 14.5 % (ref 11.5–15.5)
WBC: 5.1 10*3/uL (ref 4.0–10.5)
nRBC: 0 % (ref 0.0–0.2)

## 2020-09-30 LAB — COMPREHENSIVE METABOLIC PANEL
ALT: 18 U/L (ref 0–44)
AST: 17 U/L (ref 15–41)
Albumin: 3.4 g/dL — ABNORMAL LOW (ref 3.5–5.0)
Alkaline Phosphatase: 78 U/L (ref 38–126)
Anion gap: 10 (ref 5–15)
BUN: 58 mg/dL — ABNORMAL HIGH (ref 6–20)
CO2: 25 mmol/L (ref 22–32)
Calcium: 8.7 mg/dL — ABNORMAL LOW (ref 8.9–10.3)
Chloride: 104 mmol/L (ref 98–111)
Creatinine, Ser: 2.74 mg/dL — ABNORMAL HIGH (ref 0.61–1.24)
GFR, Estimated: 27 mL/min — ABNORMAL LOW (ref >60)
Glucose, Bld: 90 mg/dL (ref 70–99)
Potassium: 4.8 mmol/L (ref 3.5–5.1)
Sodium: 139 mmol/L (ref 135–145)
Total Bilirubin: 0.8 mg/dL (ref 0.3–1.2)
Total Protein: 6.5 g/dL (ref 6.5–8.1)

## 2020-09-30 LAB — BRAIN NATRIURETIC PEPTIDE: B Natriuretic Peptide: 130 pg/mL — ABNORMAL HIGH (ref 0.0–100.0)

## 2020-09-30 LAB — GLUCOSE, CAPILLARY
Glucose-Capillary: 84 mg/dL (ref 70–99)
Glucose-Capillary: 168 mg/dL — ABNORMAL HIGH (ref 70–99)
Glucose-Capillary: 100 mg/dL — ABNORMAL HIGH (ref 70–99)
Glucose-Capillary: 109 mg/dL — ABNORMAL HIGH (ref 70–99)

## 2020-09-30 NOTE — Progress Notes (Signed)
Pt sitting in bed with legs elevated and ace wrap dressing intact. Noticeable decrease in feet/lower leg swelling since last pm. Ace wraps removed and knee high TED hose applied. Pt states he feels much better today, a lot less short of breath and able to lay down and sleep last night. MD Courage notified.

## 2020-09-30 NOTE — Progress Notes (Signed)
Pt ambulated in hallway > 200 ft unassisted with no difficulty or increased SOB. Sa02 prior to walk was 98% on RA, post ambulation 92%. MD Courage aware.

## 2020-09-30 NOTE — Progress Notes (Signed)
Pt has removed TED hose as he says they are now too tight and hurting his legs. Reminded pt that sitting on side of bed with legs dangling dependently will increase swelling, which is why we have been encouraging him to keep legs elevated as much as possible today. Pt states understanding. Currently sitting on side of bed eating supper.

## 2020-09-30 NOTE — Progress Notes (Signed)
PROGRESS NOTE    Patient: [name]                            PCP: [name], MD                    DOB: Jul 15, 1966            DOA: 09/27/2020 [id number]             DOS: 09/30/2020, 4:40 PM   LOS: 3 days   Date of Service: The patient was seen and examined on 09/30/2020  Subjective:    -Dyspnea on exertion improving No fever  Or chills  -Lower extremity edema improved--teds on  Brief Narrative:   [name] is a 55 y.o. male with medical history significant for hypertension, diabetes mellitus, diastolic CHF, CKD 3. Patient presented to the ED with complaints of difficulty breathing, leg swelling and weight gain.  He was admitted in November for decompensated CHF and he felt better after that admission, but reports that he is breathing has gradually  worsened.  He has had to increase his number of pillows from 2-3.  He feels like he is choking or getting strangled when he is lying back.  Last admission weight was 272, and he was diuresed to his discharge weight of 260.  Today per our charts his weight is 298 pounds.  No chest pain.  He reports compliance with his torsemide 40 mg twice a day.  He denies dietary indiscretion.  ED Course:  O2 sats 90 to 96% on room air.  Respiratory rate 18-25.  Temperature 97.9.  Heart rate 80s.  Stable hemoglobin at 9.4.  WBC 5.3.  Troponin 11.  BNP 219.  Creatinine elevated at 2.9. Chest x-ray shows right base consolidation, chronic cough central vascular congestion.  80 mg IV Lasix given.  Hospitalist to admit.   Assessment & Plan:   Principal Problem:   Acute on chronic diastolic (congestive) heart failure (HCC) Active Problems:   Essential hypertension   Type 2 diabetes with nephropathy (HCC)   Acute renal failure superimposed on stage 3a chronic kidney disease (HCC)   Class 2 obesity   CKD (chronic kidney disease) stage 3, GFR 30-59 ml/min (HCC)   1)Acute on chronic diastolic CHF -DOE persist Echo from 03/2020 - with  EF of 50 % and Grade 2 diastolic CHF -Still having 4+ pitting edema lower extremities -Continue IV Lasix at 60 mg every 12 hours -Wt is down to 281 pounds from 298 pounds on admission -Patient had gained almost 40 pounds PTA -Repeat chest x-ray on 09/30/2020 with pulmonary venous congestion and right-sided pleural effusion -We will attempt right-sided thoracentesis - patient is morbidly obese with diastolic dysfunction CHF so BNP may not be particularly elevated   2)Possible concomitant CAP--- continue Rocephin and azithromycin,  -We will attempt right-sided thoracentesis   3)Acute on chronic renal failure stage IIIb.   Creatinine continues to trend down  -PTA creatinine was 1.9 about 3 weeks ago  -Likely cardiorenal. - renally adjust medications, avoid nephrotoxic agents / dehydration  / hypotension  4)Diabetes mellitus - HgbA1c-6.9 reflecting excellent diabetic control PTA -We will continue to hold  glimepiride and Metformin  Use Novolog/Humalog Sliding scale insulin with Accu-Cheks/Fingersticks as ordered   5)Hypertension -stable. C/n Norvasc, labetalol, hydralazine  6)Chronic anemia hemoglobin stable, anemia panel 2/22 suggests iron deficiency anemia. -Outpatient work-up including endoluminal evaluation as outpatient when cardiopulmonary  status is more stable   ------------------------------------------------------------------------------------------------------------------------------- Nutritional status:  The patient's BMI is: Body mass index is 39.26 kg/m. I agree with the assessment and plan as outlined   Cultures; NONE   Antimicrobials: -Rocephin and azithromycin started 09/29/2020  DVT prophylaxis: Lovenox Code Status: Full code Family Communication: None at bedside. Disposition Plan:  1-2  days, pending improvement in volume status with diuresis. Consults called: None Admission status: Inpt,Tele   evel of care: Telemetry   Procedures:   No admission  procedures for hospital encounter.     Antimicrobials:  Anti-infectives (From admission, onward)   Start     Dose/Rate Route Frequency Ordered Stop   09/29/20 1800  azithromycin (ZITHROMAX) 500 mg in sodium chloride 0.9 % 250 mL IVPB        500 mg 250 mL/hr over 60 Minutes Intravenous Every 24 hours 09/29/20 1648     09/29/20 1715  cefTRIAXone (ROCEPHIN) 1 g in sodium chloride 0.9 % 100 mL IVPB        1 g 200 mL/hr over 30 Minutes Intravenous Every 24 hours 09/29/20 1648         Medication:  . amLODipine  10 mg Oral Daily  . furosemide  60 mg Intravenous Q12H  . heparin injection (subcutaneous)  5,000 Units Subcutaneous Q8H  . hydrALAZINE  100 mg Oral TID  . insulin aspart  0-15 Units Subcutaneous TID WC  . insulin aspart  0-5 Units Subcutaneous QHS  . labetalol  200 mg Oral BID  . pravastatin  40 mg Oral Daily   sodium chloride, acetaminophen **OR** acetaminophen, ondansetron **OR** ondansetron (ZOFRAN) IV, polyethylene glycol  Objective:   Vitals:   09/30/20 0456 09/30/20 0929 09/30/20 1408 09/30/20 1519  BP: (!) 151/71 (!) 145/80 (!) 115/55   Pulse: 84 91 84   Resp: 18 20    Temp: 98.8 F (37.1 C) 98.3 F (36.8 C) 98.2 F (36.8 C)   TempSrc: Oral Oral Oral   SpO2: 95% 92% 95% 96%  Weight: 127.7 kg     Height:        Intake/Output Summary (Last 24 hours) at 09/30/2020 1640 Last data filed at 09/30/2020 1300 Gross per 24 hour  Intake 1187 ml  Output 2475 ml  Net -1288 ml   Filed Weights   09/28/20 0500 09/29/20 0500 09/30/20 0456  Weight: 131.5 kg 127.9 kg 127.7 kg    Examination:   Physical Exam  Constitution:  Alert, cooperative,   HEENT: Normocephalic, PERRL, otherwise with in Normal limits  Chest/Lungs : Diminished breath sounds, especially on the right very faint bibasilar rales bilateral  cardio vascular:  S1/S2, RRR, No murmure, No Rubs or Gallops  Abdomen: Soft, non-tender, non-distended, bowel sounds, Neuro: Generalized weakness, no new focal  deficit  Extremities: 2+  pitting edema lower extremities, +2 pulses  Skin: Dry, warm to touch, negative for any Rashes, No open wounds   LABs:  CBC Latest Ref Rng & Units 09/30/2020 09/27/2020 09/06/2020  WBC 4.0 - 10.5 K/uL 5.1 5.3 4.9  Hemoglobin 13.0 - 17.0 g/dL 10.2(L) 9.4(L) 10.8(L)  Hematocrit 39.0 - 52.0 % 31.6(L) 29.3(L) 33.1(L)  Platelets 150 - 400 K/uL 272 261 298   CMP Latest Ref Rng & Units 09/30/2020 09/28/2020 09/27/2020  Glucose 70 - 99 mg/dL 90 61(L) 73  BUN 6 - 20 mg/dL 58(H) 57(H) 57(H)  Creatinine 0.61 - 1.24 mg/dL 2.74(H) 2.86(H) 2.90(H)  Sodium 135 - 145 mmol/L 139 142 140  Potassium 3.5 - 5.1 mmol/L  4.8 4.7 4.9  Chloride 98 - 111 mmol/L 104 110 110  CO2 22 - 32 mmol/L 25 24 22   Calcium 8.9 - 10.3 mg/dL 8.7(L) 8.2(L) 8.2(L)  Total Protein 6.5 - 8.1 g/dL 6.5 - -  Total Bilirubin 0.3 - 1.2 mg/dL 0.8 - -  Alkaline Phos 38 - 126 U/L 78 - -  AST 15 - 41 U/L 17 - -  ALT 0 - 44 U/L 18 - -    Micro Results Recent Results (from the past 240 hour(s))  Resp Panel by RT-PCR (Flu A&B, Covid) Nasopharyngeal Swab     Status: None   Collection Time: 09/27/20  7:01 PM   Specimen: Nasopharyngeal Swab; Nasopharyngeal(NP) swabs in vial transport medium  Result Value Ref Range Status   SARS Coronavirus 2 by RT PCR NEGATIVE NEGATIVE Final    Comment: (NOTE) SARS-CoV-2 target nucleic acids are NOT DETECTED.  The SARS-CoV-2 RNA is generally detectable in upper respiratory specimens during the acute phase of infection. The lowest concentration of SARS-CoV-2 viral copies this assay can detect is 138 copies/mL. A negative result does not preclude SARS-Cov-2 infection and should not be used as the sole basis for treatment or other patient management decisions. A negative result may occur with  improper specimen collection/handling, submission of specimen other than nasopharyngeal swab, presence of viral mutation(s) within the areas targeted by this assay, and inadequate number of  viral copies(<138 copies/mL). A negative result must be combined with clinical observations, patient history, and epidemiological information. The expected result is Negative.  Fact Sheet for Patients:  EntrepreneurPulse.com.au  Fact Sheet for Healthcare Providers:  IncredibleEmployment.be  This test is no t yet approved or cleared by the [place] FDA and  has been authorized for detection and/or diagnosis of SARS-CoV-2 by FDA under an Emergency Use Authorization (EUA). This EUA will remain  in effect (meaning this test can be used) for the duration of the COVID-19 declaration under Section 564(b)(1) of the Act, 21 U.S.C.section 360bbb-3(b)(1), unless the authorization is terminated  or revoked sooner.       Influenza A by PCR NEGATIVE NEGATIVE Final   Influenza B by PCR NEGATIVE NEGATIVE Final    Comment: (NOTE) The Xpert Xpress SARS-CoV-2/FLU/RSV plus assay is intended as an aid in the diagnosis of influenza from Nasopharyngeal swab specimens and should not be used as a sole basis for treatment. Nasal washings and aspirates are unacceptable for Xpert Xpress SARS-CoV-2/FLU/RSV testing.  Fact Sheet for Patients: EntrepreneurPulse.com.au  Fact Sheet for Healthcare Providers: IncredibleEmployment.be  This test is not yet approved or cleared by the [place] FDA and has been authorized for detection and/or diagnosis of SARS-CoV-2 by FDA under an Emergency Use Authorization (EUA). This EUA will remain in effect (meaning this test can be used) for the duration of the COVID-19 declaration under Section 564(b)(1) of the Act, 21 U.S.C. section 360bbb-3(b)(1), unless the authorization is terminated or revoked.  Performed at [facility], [address] [place], [place] [address]    Radiology Reports DG Chest 2 View  Result Date: 09/30/2020 CLINICAL DATA:  Dyspnea EXAM: CHEST - 2 VIEW COMPARISON:   09/27/2020 FINDINGS: Pulmonary insufflation is stable. There is central pulmonary vascular congestion without overt pulmonary edema, similar to prior examination. Moderate right pleural effusion is present with compressive atelectasis of the right lower lobe, unchanged. Small left pleural effusion is present. No pneumothorax. Cardiac size is mildly enlarged, unchanged. IMPRESSION: Stable examination with central pulmonary vascular congestion and small left and moderate  right pleural effusion with associated right basilar compressive atelectasis. Electronically Signed   By: Fidela Salisbury MD   On: 09/30/2020 05:28   DG Chest Portable 1 View  Result Date: 09/27/2020 CLINICAL DATA:  Short of breath for several days, diabetes, hypertension EXAM: PORTABLE CHEST 1 VIEW COMPARISON:  06/15/2020 FINDINGS: Single frontal view of the chest demonstrates stable enlargement of the cardiac silhouette. There is persistent right basilar consolidation and small right pleural effusion, with little change since the preceding exam. Mild chronic central vascular congestion. No pneumothorax. IMPRESSION: 1. Persistent right basilar consolidation and small right pleural effusion. 2. Chronic central vascular congestion. 3. Persistent enlargement the cardiac silhouette. Electronically Signed   By: Randa Ngo M.D.   On: 09/27/2020 15:42     Ehren Berisha Denton Brick, MD,  [facility],  Pager (please use [email or web address] to page/text) Please use Epic Secure Chat for non-urgent communication (7AM-7PM)  If 7PM-7AM, please contact night-coverage [email or web address], 09/30/2020, 4:40 PM

## 2020-10-01 ENCOUNTER — Encounter (HOSPITAL_COMMUNITY): Payer: Self-pay | Admitting: Internal Medicine

## 2020-10-01 ENCOUNTER — Inpatient Hospital Stay (HOSPITAL_COMMUNITY): Payer: Commercial Managed Care - PPO

## 2020-10-01 DIAGNOSIS — J90 Pleural effusion, not elsewhere classified: Secondary | ICD-10-CM | POA: Diagnosis present

## 2020-10-01 LAB — CBC
HCT: 30.4 % — ABNORMAL LOW (ref 39.0–52.0)
Hemoglobin: 10 g/dL — ABNORMAL LOW (ref 13.0–17.0)
MCH: 27.8 pg (ref 26.0–34.0)
MCHC: 32.9 g/dL (ref 30.0–36.0)
MCV: 84.4 fL (ref 80.0–100.0)
Platelets: 274 10*3/uL (ref 150–400)
RBC: 3.6 MIL/uL — ABNORMAL LOW (ref 4.22–5.81)
RDW: 14.5 % (ref 11.5–15.5)
WBC: 5 10*3/uL (ref 4.0–10.5)
nRBC: 0 % (ref 0.0–0.2)

## 2020-10-01 LAB — BASIC METABOLIC PANEL
Anion gap: 12 (ref 5–15)
BUN: 59 mg/dL — ABNORMAL HIGH (ref 6–20)
CO2: 26 mmol/L (ref 22–32)
Calcium: 8.6 mg/dL — ABNORMAL LOW (ref 8.9–10.3)
Chloride: 103 mmol/L (ref 98–111)
Creatinine, Ser: 2.93 mg/dL — ABNORMAL HIGH (ref 0.61–1.24)
GFR, Estimated: 24 mL/min — ABNORMAL LOW (ref >60)
Glucose, Bld: 96 mg/dL (ref 70–99)
Potassium: 4.4 mmol/L (ref 3.5–5.1)
Sodium: 141 mmol/L (ref 135–145)

## 2020-10-01 LAB — GLUCOSE, CAPILLARY: Glucose-Capillary: 99 mg/dL (ref 70–99)

## 2020-10-01 MED ORDER — TORSEMIDE 40 MG PO TABS: 40.0000 mg | ORAL_TABLET | Freq: Every evening | ORAL | 3 refills | Status: DC

## 2020-10-01 MED ORDER — POTASSIUM CHLORIDE ER 10 MEQ PO TBCR: 10.0000 meq | EXTENDED_RELEASE_TABLET | Freq: Every day | ORAL | 2 refills | Status: DC

## 2020-10-01 MED ORDER — ACETAMINOPHEN 325 MG PO TABS: 650.0000 mg | ORAL_TABLET | Freq: Four times a day (QID) | ORAL | 1 refills | Status: DC | PRN

## 2020-10-01 MED ORDER — HYDRALAZINE HCL 100 MG PO TABS: 100.0000 mg | ORAL_TABLET | Freq: Three times a day (TID) | ORAL | 3 refills | Status: DC

## 2020-10-01 MED ORDER — AMLODIPINE BESYLATE 10 MG PO TABS: 10.0000 mg | ORAL_TABLET | Freq: Every day | ORAL | 3 refills | Status: DC

## 2020-10-01 MED ORDER — LABETALOL HCL 200 MG PO TABS: 200.0000 mg | ORAL_TABLET | Freq: Two times a day (BID) | ORAL | 3 refills | Status: DC

## 2020-10-01 MED ORDER — TORSEMIDE 60 MG PO TABS: 60.0000 mg | ORAL_TABLET | Freq: Every morning | ORAL | 3 refills | Status: DC

## 2020-10-01 MED ORDER — CEFDINIR 300 MG PO CAPS: 300.0000 mg | ORAL_CAPSULE | Freq: Two times a day (BID) | ORAL | 0 refills | Status: AC

## 2020-10-01 MED ORDER — GLIMEPIRIDE 4 MG PO TABS: 2.0000 mg | ORAL_TABLET | Freq: Two times a day (BID) | ORAL | 5 refills | Status: DC

## 2020-10-01 MED ORDER — AZITHROMYCIN 500 MG PO TABS: 500.0000 mg | ORAL_TABLET | Freq: Every day | ORAL | 0 refills | Status: AC

## 2020-10-01 NOTE — Discharge Instructions (Signed)
1)Very low-salt diet advised 2)Weigh yourself daily, call if you gain more than 3 pounds in 1 day or more than 5 pounds in 1 week as your diuretic medications may need to be adjusted 3)Limit your Fluid  intake to no more than 50 ounces (1.5 Liters) per day 4) your torsemide has been changed to 60 mg in the morning and 40 mg in the evening 5) please avoid hot dogs, sausages, bacon, ham and other highly salted foods 6) please stop Metformin due to your kidney 7) repeat BMP blood test with the primary care physician within a week advised, outpatient follow-up with nephrologist as previously advised 8)Your weight today is down to 278 pounds----as advised weight yourself daily and take you with child/diary with you to your doctor's office when you next see your doctor 9) outpatient follow-up with cardiologist Dr. Harl Bowie advised

## 2020-10-01 NOTE — Discharge Summary (Signed)
[name], is a 55 y.o. male  DOB August 01, 1965  MRN [id number].  Admission date:  09/27/2020  Admitting Physician  Bethena Roys, MD  Discharge Date:  10/01/2020   Primary MD  [name], MD  Recommendations for primary care physician for things to follow:    1)Very low-salt diet advised 2)Weigh yourself daily, call if you gain more than 3 pounds in 1 day or more than 5 pounds in 1 week as your diuretic medications may need to be adjusted 3)Limit your Fluid  intake to no more than 50 ounces (1.5 Liters) per day 4) your torsemide has been changed to 60 mg in the morning and 40 mg in the evening 5) please avoid hot dogs, sausages, bacon, ham and other highly salted foods 6) please stop Metformin due to your kidney 7) repeat BMP blood test with the primary care physician within a week advised, outpatient follow-up with nephrologist as previously advised 8)Your weight today is down to 278 pounds----as advised weight yourself daily and take you with child/diary with you to your doctor's office when you next see your doctor 9) outpatient follow-up with cardiologist Dr. Harl Bowie advised  Admission Diagnosis  AKI (acute kidney injury) ([place]) [N17.9] Acute congestive heart failure, unspecified heart failure type ([place]) [I50.9] Decompensated heart failure ([place]) [I50.9]   Discharge Diagnosis  AKI (acute kidney injury) ([place]) [N17.9] Acute congestive heart failure, unspecified heart failure type ([place]) [I50.9] Decompensated heart failure ([place]) [I50.9]  Principal Problem:   Acute on chronic diastolic (congestive) heart failure ([place]) Active Problems:   Essential hypertension   Acute renal failure superimposed on stage 3a chronic kidney disease ([place])   Pleural effusion on right   Type 2 diabetes with nephropathy (HCC)   Class 2 obesity   CKD (chronic kidney disease) stage 3, GFR 30-59 ml/min (HCC)       Past Medical History:  Diagnosis Date  . CHF (congestive heart failure) ([place])   . Diabetes mellitus   . Hypertension     Past Surgical History:  Procedure Laterality Date  . INCISION AND DRAINAGE    . KNEE SURGERY       HPI  from the history and physical done on the day of admission:     Chief Complaint: Difficulty breathing  HPI: [name] is a 55 y.o. male with medical history significant for hypertension, diabetes mellitus, diastolic CHF, CKD 3. Patient presented to the ED with complaints of difficulty breathing, leg swelling and weight gain.  He was admitted in November for decompensated CHF and he felt better after that admission, but reports that he is breathing has gradually  worsened.  He has had to increase his number of pillows from 2-3.  He feels like he is choking or getting strangled when he is lying back.  Last admission weight was 272, and he was diuresed to his discharge weight of 260.  Today per our charts his weight is 298 pounds.  No chest pain.  He reports compliance with his torsemide  40 mg twice a day.  He denies dietary indiscretion.  ED Course: O2 sats 90 to 96% on room air.  Respiratory rate 18-25.  Temperature 97.9.  Heart rate 80s.  Stable hemoglobin at 9.4.  WBC 5.3.  Troponin 11.  BNP 219.  Creatinine elevated at 2.9. Chest x-ray shows right base consolidation, chronic cough central vascular congestion.  80 mg IV Lasix given.  Hospitalist to admit.  Review of Systems: As per HPI all other systems reviewed and negative     Hospital Course:        1)Acute on chronic diastolic CHF Echo from 10/84 - with EF of 50 % and Grade 2 diastolic CHF -Overall much improved with IV Lasix -Wt is down to 278 pounds from 298 pounds on admission -Patient had gained almost 40 pounds PTA --Discussed with Dr Weber Cooks radiologist who recommends against right-sided thoracentesis at this time as this effusion appears to have been stable and is unlikely to be  empyema or malignancy as per radiology - patient is morbidly obese with diastolic dysfunction CHF so BNP may not be particularly elevated -Okay to discharge home on torsemide 60 mg every morning 40 mg every afternoon with fluid and salt restriction and daily weight   2)Possible concomitant CAP--- continue Rocephin and azithromycin,  -Discussed with Dr Weber Cooks radiologist who recommends against right-sided thoracentesis at this time as this effusion appears to have been stable and is unlikely to be empyema or malignancy as per radiology -Okay to treat with antibiotics and repeat chest x-ray in 4 to 6 weeks  3)Acute on chronic renal failure stage IIIb.  Creatinine  stable over the last few days at 2.9 -PTA creatinine was 1.9 about 3 weeks ago  -Likely cardiorenal. - renally adjust medications, avoid nephrotoxic agents / dehydration  / hypotension -Repeat BMP within a week  4)Diabetes mellitus -HgbA1c-6.9 reflecting excellent diabetic control PTA -Restart glimepiride, discontinue Metformin due to kidney cancer   5)Hypertension -stable. C/n Norvasc, labetalol, hydralazine  6)Chronic anemia hemoglobin stable around 10, anemia panel 2/22 suggests iron deficiency anemia. -Outpatient work-up including endoluminal evaluation as outpatient when cardiopulmonary status is more stable   Discharge Condition: stable  Follow UP   Follow-up Information    [name], MD. Schedule an appointment as soon as possible for a visit in 5 day(s).   Specialty: Family Medicine Why: Needs Repeat BMP Blood test Contact information: [place] [address] [phone]        [name], MD .   Specialty: Cardiology Contact information: [address] [place] [address] [phone]                Diet and Activity recommendation:  As advised  Discharge Instructions    Discharge Instructions    Call MD for:  difficulty breathing,  headache or visual disturbances   Complete by: As directed    Call MD for:  persistant dizziness or light-headedness   Complete by: As directed    Call MD for:  persistant nausea and vomiting   Complete by: As directed    Call MD for:  temperature >100.4   Complete by: As directed    Diet - low sodium heart healthy   Complete by: As directed    Diet Carb Modified   Complete by: As directed    Discharge instructions   Complete by: As directed    1)Very low-salt diet advised 2)Weigh yourself daily, call if you gain more than 3 pounds  in 1 day or more than 5 pounds in 1 week as your diuretic medications may need to be adjusted 3)Limit your Fluid  intake to no more than 50 ounces (1.5 Liters) per day 4) your torsemide has been changed to 60 mg in the morning and 40 mg in the evening 5) please avoid hot dogs, sausages, bacon, ham and other highly salted foods 6) please stop Metformin due to your kidney 7) repeat BMP blood test with the primary care physician within a week advised, outpatient follow-up with nephrologist as previously advised 8)Your weight today is down to 278 pounds----as advised weight yourself daily and take you with child/diary with you to your doctor's office when you next see your doctor   Increase activity slowly   Complete by: As directed         Discharge Medications     Allergies as of 10/01/2020   No Known Allergies     Medication List    STOP taking these medications   metFORMIN 500 MG 24 hr tablet Commonly known as: GLUCOPHAGE-XR     TAKE these medications   acetaminophen 325 MG tablet Commonly known as: TYLENOL Take 2 tablets (650 mg total) by mouth every 6 (six) hours as needed for mild pain (or Fever >/= 101).   amLODipine 10 MG tablet Commonly known as: NORVASC Take 1 tablet (10 mg total) by mouth daily.   azithromycin 500 MG tablet Commonly known as: ZITHROMAX Take 1 tablet (500 mg total) by mouth daily for 5 days.   cefdinir 300 MG  capsule Commonly known as: OMNICEF Take 1 capsule (300 mg total) by mouth 2 (two) times daily for 5 days.   glimepiride 4 MG tablet Commonly known as: AMARYL Take 0.5 tablets (2 mg total) by mouth 2 (two) times daily with a meal. What changed: when to take this   hydrALAZINE 100 MG tablet Commonly known as: APRESOLINE Take 1 tablet (100 mg total) by mouth 3 (three) times daily.   labetalol 200 MG tablet Commonly known as: NORMODYNE Take 1 tablet (200 mg total) by mouth 2 (two) times daily.   multivitamin with minerals Tabs tablet Take 1 tablet by mouth daily.   potassium chloride 10 MEQ tablet Commonly known as: KLOR-CON Take 1 tablet (10 mEq total) by mouth daily. Take While taking Demadex/Torsemide   pravastatin 40 MG tablet Commonly known as: PRAVACHOL Take 40 mg by mouth in the morning.   Torsemide 60 MG Tabs Take 60 mg by mouth in the morning. What changed:   medication strength  how much to take  when to take this   Torsemide 40 MG Tabs Take 40 mg by mouth every evening. What changed: You were already taking a medication with the same name, and this prescription was added. Make sure you understand how and when to take each.       Major procedures and Radiology Reports - PLEASE review detailed and final reports for all details, in brief -   DG Chest 2 View  Result Date: 09/30/2020 CLINICAL DATA:  Dyspnea EXAM: CHEST - 2 VIEW COMPARISON:  09/27/2020 FINDINGS: Pulmonary insufflation is stable. There is central pulmonary vascular congestion without overt pulmonary edema, similar to prior examination. Moderate right pleural effusion is present with compressive atelectasis of the right lower lobe, unchanged. Small left pleural effusion is present. No pneumothorax. Cardiac size is mildly enlarged, unchanged. IMPRESSION: Stable examination with central pulmonary vascular congestion and small left and moderate right pleural effusion with associated  right basilar  compressive atelectasis. Electronically Signed   By: Fidela Salisbury MD   On: 09/30/2020 05:28   DG Chest Portable 1 View  Result Date: 09/27/2020 CLINICAL DATA:  Short of breath for several days, diabetes, hypertension EXAM: PORTABLE CHEST 1 VIEW COMPARISON:  06/15/2020 FINDINGS: Single frontal view of the chest demonstrates stable enlargement of the cardiac silhouette. There is persistent right basilar consolidation and small right pleural effusion, with little change since the preceding exam. Mild chronic central vascular congestion. No pneumothorax. IMPRESSION: 1. Persistent right basilar consolidation and small right pleural effusion. 2. Chronic central vascular congestion. 3. Persistent enlargement the cardiac silhouette. Electronically Signed   By: Randa Ngo M.D.   On: 09/27/2020 15:42   Micro Results   Recent Results (from the past 240 hour(s))  Resp Panel by RT-PCR (Flu A&B, Covid) Nasopharyngeal Swab     Status: None   Collection Time: 09/27/20  7:01 PM   Specimen: Nasopharyngeal Swab; Nasopharyngeal(NP) swabs in vial transport medium  Result Value Ref Range Status   SARS Coronavirus 2 by RT PCR NEGATIVE NEGATIVE Final    Comment: (NOTE) SARS-CoV-2 target nucleic acids are NOT DETECTED.  The SARS-CoV-2 RNA is generally detectable in upper respiratory specimens during the acute phase of infection. The lowest concentration of SARS-CoV-2 viral copies this assay can detect is 138 copies/mL. A negative result does not preclude SARS-Cov-2 infection and should not be used as the sole basis for treatment or other patient management decisions. A negative result may occur with  improper specimen collection/handling, submission of specimen other than nasopharyngeal swab, presence of viral mutation(s) within the areas targeted by this assay, and inadequate number of viral copies(<138 copies/mL). A negative result must be combined with clinical observations, patient history, and  epidemiological information. The expected result is Negative.  Fact Sheet for Patients:  EntrepreneurPulse.com.au  Fact Sheet for Healthcare Providers:  IncredibleEmployment.be  This test is no t yet approved or cleared by the [place] FDA and  has been authorized for detection and/or diagnosis of SARS-CoV-2 by FDA under an Emergency Use Authorization (EUA). This EUA will remain  in effect (meaning this test can be used) for the duration of the COVID-19 declaration under Section 564(b)(1) of the Act, 21 U.S.C.section 360bbb-3(b)(1), unless the authorization is terminated  or revoked sooner.       Influenza A by PCR NEGATIVE NEGATIVE Final   Influenza B by PCR NEGATIVE NEGATIVE Final    Comment: (NOTE) The Xpert Xpress SARS-CoV-2/FLU/RSV plus assay is intended as an aid in the diagnosis of influenza from Nasopharyngeal swab specimens and should not be used as a sole basis for treatment. Nasal washings and aspirates are unacceptable for Xpert Xpress SARS-CoV-2/FLU/RSV testing.  Fact Sheet for Patients: EntrepreneurPulse.com.au  Fact Sheet for Healthcare Providers: IncredibleEmployment.be  This test is not yet approved or cleared by the [place] FDA and has been authorized for detection and/or diagnosis of SARS-CoV-2 by FDA under an Emergency Use Authorization (EUA). This EUA will remain in effect (meaning this test can be used) for the duration of the COVID-19 declaration under Section 564(b)(1) of the Act, 21 U.S.C. section 360bbb-3(b)(1), unless the authorization is terminated or revoked.  Performed at [facility], [address] [place], [place] [address]    Today   Subjective    [name] today has no new complaints, -Voiding well, ambulating without significant dyspnea on exertion or chest pains today          Patient has  been seen and examined prior to discharge   Objective    Blood pressure (!) 146/83, pulse 80, temperature 98.1 F (36.7 C), temperature source Oral, resp. rate 18, height 5\' 11"  (1.803 m), weight 126 kg, SpO2 96 %.   Intake/Output Summary (Last 24 hours) at 10/01/2020 1121 Last data filed at 09/30/2020 1833 Gross per 24 hour  Intake 480 ml  Output --  Net 480 ml    Exam Gen:- Awake Alert, no acute distress , speaking in complete sentences HEENT:- [place].AT, No sclera icterus Neck-Supple Neck,No JVD,.  Lungs-diminished breath sounds on the right but overall air movement-improved, no wheezing CV- S1, S2 normal, regular Abd-  +ve B.Sounds, Abd Soft, No tenderness,    Extremity/Skin:-Much improved lower extremity edema,   good pulses Psych-affect is appropriate, oriented x3 Neuro-no new focal deficits, no tremors    Data Review   CBC w Diff:  Lab Results  Component Value Date   WBC 5.0 10/01/2020   HGB 10.0 (L) 10/01/2020   HCT 30.4 (L) 10/01/2020   PLT 274 10/01/2020   LYMPHOPCT 24 09/06/2020   MONOPCT 11 09/06/2020   EOSPCT 2 09/06/2020   BASOPCT 1 09/06/2020    CMP:  Lab Results  Component Value Date   NA 141 10/01/2020   NA 145 (H) 04/28/2020   K 4.4 10/01/2020   CL 103 10/01/2020   CO2 26 10/01/2020   BUN 59 (H) 10/01/2020   BUN 34 (H) 04/28/2020   CREATININE 2.93 (H) 10/01/2020   PROT 6.5 09/30/2020   ALBUMIN 3.4 (L) 09/30/2020   BILITOT 0.8 09/30/2020   ALKPHOS 78 09/30/2020   AST 17 09/30/2020   ALT 18 09/30/2020  .   Total Discharge time is about 33 minutes  Roxan Hockey M.D on 10/01/2020 at 11:21 AM  Go to [email or web address] -  for contact info  [facility] - Office  [phone]

## 2020-10-11 NOTE — Progress Notes (Signed)
Cardiology Office Note    Date:  10/12/2020   ID:  [name], DOB November 18, 1965, MRN [id number]   PCP:  [name], [place]  Cardiologist:  Carlyle Dolly, MD   Advanced Practice Provider:  No care team member to display Electrophysiologist:  None   [phone]   Chief Complaint  Patient presents with  . Follow-up    CHF, SOB   . Hospitalization Follow-up    History of Present Illness:  [name] is a 55 y.o. male with history of resistant hypertension, DM 2, CKD stage III, chronic diastolic CHF with multiple hospitalizations for such.  Echo 03/2020 EF 50% grade 2 DD  Discharge 10/01/2020 after another admission for acute on chronic diastolic CHF.  Weight was 278 pounds at discharge down from 298 pounds on admission.  Right-sided pleural effusion radiologist recommended not to do thoracentesis.  Possible concomitant CAP treated with antibiotics with plans to repeat chest x-ray in 4 to 6 weeks.  Creatinine up to 2.9 in the hospital was 1.9 prior to admission.  Patient comes in for f/u. Weight up to 290 lbs today. Ate fast food once since he's been home.They are trying to limit salt.  He has some DOE and swelling coming back.     Past Medical History:  Diagnosis Date  . CHF (congestive heart failure) ([place])   . Diabetes mellitus   . Hypertension     Past Surgical History:  Procedure Laterality Date  . INCISION AND DRAINAGE    . KNEE SURGERY      Current Medications: Current Meds  Medication Sig  . acetaminophen (TYLENOL) 325 MG tablet Take 2 tablets (650 mg total) by mouth every 6 (six) hours as needed for mild pain (or Fever >/= 101).  Marland Kitchen amLODipine (NORVASC) 10 MG tablet Take 1 tablet (10 mg total) by mouth daily.  Marland Kitchen glimepiride (AMARYL) 4 MG tablet Take 0.5 tablets (2 mg total) by mouth 2 (two) times daily with a meal.  . hydrALAZINE (APRESOLINE) 100 MG tablet Take 1 tablet (100 mg total) by mouth 3 (three) times daily.   Marland Kitchen labetalol (NORMODYNE) 200 MG tablet Take 1 tablet (200 mg total) by mouth 2 (two) times daily.  . Multiple Vitamin (MULTIVITAMIN WITH MINERALS) TABS tablet Take 1 tablet by mouth daily.  . potassium chloride (KLOR-CON) 10 MEQ tablet Take 1 tablet (10 mEq total) by mouth daily. Take While taking Demadex/Torsemide  . pravastatin (PRAVACHOL) 40 MG tablet Take 40 mg by mouth in the morning.   . torsemide 40 MG TABS Take 40 mg by mouth every evening.  . torsemide 60 MG TABS Take 60 mg by mouth in the morning.     Allergies:   Patient has no known allergies.   Social History   Socioeconomic History  . Marital status: Married    Spouse name: Not on file  . Number of children: Not on file  . Years of education: Not on file  . Highest education level: Not on file  Occupational History  . Not on file  Tobacco Use  . Smoking status: Never Smoker  . Smokeless tobacco: Never Used  Vaping Use  . Vaping Use: Never used  Substance and Sexual Activity  . Alcohol use: No  . Drug use: No  . Sexual activity: Not on file  Other Topics Concern  . Not on file  Social History Narrative  . Not on file   Social Determinants of  Health   Financial Resource Strain: Not on file  Food Insecurity: Not on file  Transportation Needs: Not on file  Physical Activity: Not on file  Stress: Not on file  Social Connections: Not on file     Family History:  The patient's family history includes Dementia in his mother; Diabetes in his father.   ROS:   Please see the history of present illness.    ROS All other systems reviewed and are negative.   PHYSICAL EXAM:   VS:  BP (!) 154/80   Pulse 92   Resp 18   Ht 5\' 11"  (1.803 m)   Wt 290 lb 6.4 oz (131.7 kg)   SpO2 92%   BMI 40.50 kg/m   Physical Exam  GEN: Obese, in no acute distress  Neck: slight increase JVD, no carotid bruits, or masses Cardiac:RRR; no murmurs, rubs, or gallops  Respiratory:  Decreased breath sounds at bases but clear  elsewhere GI: soft, nontender, nondistended, + BS Ext: plus 2 edema up to his knees Neuro:  Alert and Oriented x 3 Psych: euthymic mood, full affect  Wt Readings from Last 3 Encounters:  10/12/20 290 lb 6.4 oz (131.7 kg)  10/01/20 277 lb 12.5 oz (126 kg)  06/23/20 260 lb 12.8 oz (118.3 kg)      Studies/Labs Reviewed:   EKG:  EKG is not ordered today.   Recent Labs: 06/15/2020: TSH 2.093 09/27/2020: Magnesium 1.8 09/30/2020: ALT 18; B Natriuretic Peptide 130.0 10/01/2020: BUN 59; Creatinine, Ser 2.93; Hemoglobin 10.0; Platelets 274; Potassium 4.4; Sodium 141   Lipid Panel    Component Value Date/Time   CHOL 134 06/16/2020 0823   TRIG 258 (H) 06/16/2020 0823   HDL 51 06/16/2020 0823   CHOLHDL 2.6 06/16/2020 0823   VLDL 52 (H) 06/16/2020 0823   LDLCALC 31 06/16/2020 0823    Additional studies/ records that were reviewed today include:  04/06/2020   IMPRESSIONS     1. Left ventricular ejection fraction, by estimation, is 50%. The left  ventricle has normal function. The left ventricle has no regional wall  motion abnormalities. There is severe left ventricular hypertrophy. Left  ventricular diastolic parameters are  consistent with Grade II diastolic dysfunction (pseudonormalization).  Elevated left atrial pressure.   2. Right ventricular systolic function is normal. The right ventricular  size is normal.   3. Left atrial size was mildly dilated.   4. Large pleural effusion in the left lateral region.   5. The mitral valve is normal in structure. Trivial mitral valve  regurgitation. No evidence of mitral stenosis.   6. The aortic valve is tricuspid. Aortic valve regurgitation is not  visualized. No aortic stenosis is present.   7. The inferior vena cava is normal in size with greater than 50%  respiratory variability, suggesting right atrial pressure of 3 mmHg.   FINDINGS   Left Ventricle: Left ventricular ejection fraction, by estimation, is  50%. The left ventricle  has normal function. The left ventricle has no  regional wall motion abnormalities. The left ventricular internal cavity  size was normal in size. There is  severe left ventricular hypertrophy. Left ventricular diastolic parameters  are consistent with Grade II diastolic dysfunction (pseudonormalization).  Elevated left atrial pressure.   Right Ventricle: The right ventricular size is normal. No increase in  right ventricular wall thickness. Right ventricular systolic function is  normal.   Left Atrium: Left atrial size was mildly dilated.   Right Atrium: Right atrial size was  normal in size.   Pericardium: There is no evidence of pericardial effusion.   Mitral Valve: The mitral valve is normal in structure. Trivial mitral  valve regurgitation. No evidence of mitral valve stenosis.   Tricuspid Valve: The tricuspid valve is normal in structure. Tricuspid  valve regurgitation is not demonstrated. No evidence of tricuspid  stenosis.   Aortic Valve: The aortic valve is tricuspid. Aortic valve regurgitation is  not visualized. No aortic stenosis is present. Aortic valve mean gradient  measures 2.0 mmHg. Aortic valve peak gradient measures 3.9 mmHg. Aortic  valve area, by VTI measures 2.73  cm.   Pulmonic Valve: The pulmonic valve was not well visualized. Pulmonic valve  regurgitation is not visualized. No evidence of pulmonic stenosis.   Aorta: The aortic root is normal in size and structure.   Pulmonary Artery: Indeterminant PASP, inadequate TR jet.   Venous: The inferior vena cava is normal in size with greater than 50%  respiratory variability, suggesting right atrial pressure of 3 mmHg.   IAS/Shunts: No atrial level shunt detected by color flow Doppler.   Additional Comments: There is a large pleural effusion in the left lateral  region.         Risk Assessment/Calculations:         ASSESSMENT:    1. Chronic diastolic CHF (congestive heart failure) ([facility])   2.  Stage 3 chronic kidney disease, unspecified whether stage 3a or 3b CKD ([place])   3. Community acquired pneumonia of right lower lobe of lung   4. Essential hypertension   5. Mixed hyperlipidemia   6. Type 2 diabetes with nephropathy ([place])   7. OSA (obstructive sleep apnea)      PLAN:  In order of problems listed above:  Chronic diastolic CHF discharge 09/28/6576 weight 278 down from 298 pounds now back up to 290 lbs. With increasing edema. Will check bmet and bnp before adjusting diuretics. With multiple admissions will refer to AHF clinic in [place].  CKD stage III creatinine 2.9 with diuresis-recommend f/u with renal  Right pleural effusion with possible CAP treated with antibiotics plan for repeat chest x-ray in 4 to 6 weeks  Hypertension BP a little high today but fluid overload  HLD on Pravachol  DM type II  OSA is to have a sleep study once heart failure compensated -will schedule since this could help with treatment.  Shared Decision Making/Informed Consent        Medication Adjustments/Labs and Tests Ordered: Current medicines are reviewed at length with the patient today.  Concerns regarding medicines are outlined above.  Medication changes, Labs and Tests ordered today are listed in the Patient Instructions below. Patient Instructions  Medication Instructions:  Your physician recommends that you continue on your current medications as directed. Please refer to the Current Medication list given to you today.  *If you need a refill on your cardiac medications before your next appointment, please call your pharmacy*   Lab Work: BMET,BNP today  If you have labs (blood work) drawn today and your tests are completely normal, you will receive your results only by: Marland Kitchen MyChart Message (if you have MyChart) OR . A paper copy in the mail If you have any lab test that is abnormal or we need to change your treatment, we will call you to review the  results.   Testing/Procedures: None today   Follow-Up: At [facility], you and your health needs are our priority.  As part of our continuing mission to provide  you with exceptional heart care, we have created designated Provider Care Teams.  These Care Teams include your primary Cardiologist (physician) and Advanced Practice Providers (APPs -  Physician Assistants and Nurse Practitioners) who all work together to provide you with the care you need, when you need it.  We recommend signing up for the patient portal called "MyChart".  Sign up information is provided on this After Visit Summary.  MyChart is used to connect with patients for Virtual Visits (Telemedicine).  Patients are able to view lab/test results, encounter notes, upcoming appointments, etc.  Non-urgent messages can be sent to your provider as well.   To learn more about what you can do with MyChart, go to [email or web address].    Your next appointment:   1 month(s)  The format for your next appointment:   In Person  Provider:   You will see one of the following Advanced Practice Providers on your designated Care Team:    Bernerd Pho, PA-C   Ermalinda Barrios, [place]       Other Instructions Follow up in July with Dr.Branch    Follow up with your nephrologist   Second referral placed to pulmonal for sleep test.    Signed, Ermalinda Barrios, PA-C  10/12/2020 9:01 AM    [address], [place], [place]  [address] Phone: [phone]; Fax: [phone]

## 2020-10-12 ENCOUNTER — Ambulatory Visit (INDEPENDENT_AMBULATORY_CARE_PROVIDER_SITE_OTHER): Payer: Commercial Managed Care - PPO | Admitting: Physician Assistant

## 2020-10-12 ENCOUNTER — Telehealth: Payer: Self-pay

## 2020-10-12 ENCOUNTER — Other Ambulatory Visit: Payer: Self-pay

## 2020-10-12 ENCOUNTER — Other Ambulatory Visit (HOSPITAL_COMMUNITY)
Admission: RE | Admit: 2020-10-12 | Discharge: 2020-10-12 | Disposition: A | Discharge status: Home / Self Care | Payer: Commercial Managed Care - PPO | Source: Ambulatory Visit | Attending: Physician Assistant | Admitting: Physician Assistant

## 2020-10-12 ENCOUNTER — Encounter: Payer: Self-pay | Admitting: Physician Assistant

## 2020-10-12 VITALS — BP 154/80 | HR 92 | Resp 18 | Ht 71.0 in | Wt 290.4 lb

## 2020-10-12 DIAGNOSIS — E782 Mixed hyperlipidemia: Secondary | ICD-10-CM

## 2020-10-12 DIAGNOSIS — N183 Chronic kidney disease, stage 3 unspecified: Secondary | ICD-10-CM | POA: Diagnosis not present

## 2020-10-12 DIAGNOSIS — E1121 Type 2 diabetes mellitus with diabetic nephropathy: Secondary | ICD-10-CM

## 2020-10-12 DIAGNOSIS — Z79899 Other long term (current) drug therapy: Secondary | ICD-10-CM

## 2020-10-12 DIAGNOSIS — I5032 Chronic diastolic (congestive) heart failure: Secondary | ICD-10-CM

## 2020-10-12 DIAGNOSIS — I10 Essential (primary) hypertension: Secondary | ICD-10-CM | POA: Diagnosis present

## 2020-10-12 DIAGNOSIS — J189 Pneumonia, unspecified organism: Secondary | ICD-10-CM | POA: Diagnosis not present

## 2020-10-12 DIAGNOSIS — G4733 Obstructive sleep apnea (adult) (pediatric): Secondary | ICD-10-CM

## 2020-10-12 LAB — BASIC METABOLIC PANEL
Anion gap: 9 (ref 5–15)
BUN: 75 mg/dL — ABNORMAL HIGH (ref 6–20)
CO2: 22 mmol/L (ref 22–32)
Calcium: 8.6 mg/dL — ABNORMAL LOW (ref 8.9–10.3)
Chloride: 110 mmol/L (ref 98–111)
Creatinine, Ser: 2.3 mg/dL — ABNORMAL HIGH (ref 0.61–1.24)
GFR, Estimated: 33 mL/min — ABNORMAL LOW (ref >60)
Glucose, Bld: 113 mg/dL — ABNORMAL HIGH (ref 70–99)
Potassium: 5.2 mmol/L — ABNORMAL HIGH (ref 3.5–5.1)
Sodium: 141 mmol/L (ref 135–145)

## 2020-10-12 LAB — BRAIN NATRIURETIC PEPTIDE: B Natriuretic Peptide: 231 pg/mL — ABNORMAL HIGH (ref 0.0–100.0)

## 2020-10-12 MED ORDER — METOLAZONE 2.5 MG PO TABS: ORAL_TABLET | ORAL | 0 refills | Status: DC

## 2020-10-12 NOTE — Telephone Encounter (Signed)
-----  Message from Imogene Burn, PA-C sent at 10/12/2020  9:42 AM EDT ----- Heart failure marker up, kidney function down some.  Add Zaroxolyn 2.5 mg take 1 today and another one on Friday.  Next week take Zaroxolyn just once a week.  Be met next week and reiterate need for him to see renal.

## 2020-10-12 NOTE — Patient Instructions (Addendum)
Medication Instructions:  Your physician recommends that you continue on your current medications as directed. Please refer to the Current Medication list given to you today.  *If you need a refill on your cardiac medications before your next appointment, please call your pharmacy*   Lab Work: BMET,BNP today  If you have labs (blood work) drawn today and your tests are completely normal, you will receive your results only by: Marland Kitchen MyChart Message (if you have MyChart) OR . A paper copy in the mail If you have any lab test that is abnormal or we need to change your treatment, we will call you to review the results.   Testing/Procedures: None today   Follow-Up: At [facility], you and your health needs are our priority.  As part of our continuing mission to provide you with exceptional heart care, we have created designated Provider Care Teams.  These Care Teams include your primary Cardiologist (physician) and Advanced Practice Providers (APPs -  Physician Assistants and Nurse Practitioners) who all work together to provide you with the care you need, when you need it.  We recommend signing up for the patient portal called "MyChart".  Sign up information is provided on this After Visit Summary.  MyChart is used to connect with patients for Virtual Visits (Telemedicine).  Patients are able to view lab/test results, encounter notes, upcoming appointments, etc.  Non-urgent messages can be sent to your provider as well.   To learn more about what you can do with MyChart, go to [email or web address].    Your next appointment:   1 month(s)  The format for your next appointment:   In Person  Provider:   You will see one of the following Advanced Practice Providers on your designated Care Team:    Bernerd Pho, PA-C   Ermalinda Barrios, [place]       Other Instructions Follow up in July with Dr.Branch    Follow up with your nephrologist   Second referral placed to pulmonal for  sleep test.

## 2020-10-12 NOTE — Telephone Encounter (Signed)
I spoke with patient. He will take zaroxolyn 2.5 mg today and Friday, then take weekly thereafter. He will repeat BMET next week and also call nephrologist.

## 2020-11-08 NOTE — Progress Notes (Signed)
Cardiology Office Note    Date:  11/15/2020   ID:  [name], DOB August 23, 1965, MRN [id number]   PCP:  [name], [place]  Cardiologist:  Carlyle Dolly, MD  Advanced Practice Provider:  No care team member to display Electrophysiologist:  None   [phone]   Chief Complaint  Patient presents with  . Congestive Heart Failure    History of Present Illness:  [name] is a 55 y.o. male with history of resistant hypertension, DM 2, CKD stage III, chronic diastolic CHF with multiple hospitalizations for such. Echo 03/2020 EF 50% grade 2 DD   Discharge 10/01/2020 after another admission for acute on chronic diastolic CHF.  Weight was 278 pounds at discharge down from 298 pounds on admission.  Right-sided pleural effusion radiologist recommended not to do thoracentesis.  Possible concomitant CAP treated with antibiotics with plans to repeat chest x-ray in 4 to 6 weeks.  Creatinine up to 2.9 in the hospital was 1.9 prior to admission.   I saw the patient 10/12/2020 with worsening edema but weight was back up to 290 pounds.  Creatinine was 2.30 down from 2.93 potassium elevated at 5.2 BNP 231.  I added Zaroxolyn 2.5 mg to take twice that week and then once a week.  Referred to advanced heart failure clinic and asked him to make appointment with renal.  Patient comes in today and says he still has fluid overload. Lost 3 lbs with zaroxolyn. Got very short of breath trying to start a weed eater.Dr. Karie Kirks increased demadex but patient didn't remember dose and hasn't done it yet. Still getting some canned soups but wife trying to limit him.  Past Medical History:  Diagnosis Date  . CHF (congestive heart failure) ([place])   . Diabetes mellitus   . Hypertension     Past Surgical History:  Procedure Laterality Date  . INCISION AND DRAINAGE    . KNEE SURGERY      Current Medications: Current Meds  Medication Sig  . acetaminophen (TYLENOL)  325 MG tablet Take 2 tablets (650 mg total) by mouth every 6 (six) hours as needed for mild pain (or Fever >/= 101).  Marland Kitchen amLODipine (NORVASC) 10 MG tablet Take 1 tablet (10 mg total) by mouth daily.  Marland Kitchen glimepiride (AMARYL) 4 MG tablet Take 0.5 tablets (2 mg total) by mouth 2 (two) times daily with a meal.  . hydrALAZINE (APRESOLINE) 100 MG tablet Take 1 tablet (100 mg total) by mouth 3 (three) times daily.  Marland Kitchen labetalol (NORMODYNE) 200 MG tablet Take 2 tablets (400 mg total) by mouth 2 (two) times daily.  . metolazone (ZAROXOLYN) 2.5 MG tablet Take 2.5 mg Two Times Weekly on Monday and Friday  . Multiple Vitamin (MULTIVITAMIN WITH MINERALS) TABS tablet Take 1 tablet by mouth daily.  . potassium chloride (KLOR-CON) 10 MEQ tablet Take 1 tablet (10 mEq total) by mouth daily. Take While taking Demadex/Torsemide  . pravastatin (PRAVACHOL) 40 MG tablet Take 40 mg by mouth in the morning.   . torsemide 60 MG TABS Take 60 mg by mouth in the morning.  . [DISCONTINUED] metolazone (ZAROXOLYN) 2.5 MG tablet 10/12/20 Take 1 tablet today and another on Frtiday 3/25. Then take weekly     Allergies:   Patient has no known allergies.   Social History   Socioeconomic History  . Marital status: Married    Spouse name: Not on file  . Number of children: Not on file  .  Years of education: Not on file  . Highest education level: Not on file  Occupational History  . Not on file  Tobacco Use  . Smoking status: Never Smoker  . Smokeless tobacco: Never Used  Vaping Use  . Vaping Use: Never used  Substance and Sexual Activity  . Alcohol use: No  . Drug use: No  . Sexual activity: Not on file  Other Topics Concern  . Not on file  Social History Narrative  . Not on file   Social Determinants of Health   Financial Resource Strain: Not on file  Food Insecurity: Not on file  Transportation Needs: Not on file  Physical Activity: Not on file  Stress: Not on file  Social Connections: Not on file      Family History:  The patient's family history includes Dementia in his mother; Diabetes in his father.   ROS:   Please see the history of present illness.    ROS All other systems reviewed and are negative.   PHYSICAL EXAM:   VS:  BP (!) 172/102   Pulse 92   Ht 5\' 11"  (1.803 m)   Wt 287 lb 9.6 oz (130.5 kg)   SpO2 97%   BMI 40.11 kg/m   Physical Exam  GEN: Obese, in no acute distress  Neck: no JVD, carotid bruits, or masses Cardiac:RRR; no murmurs, rubs, or gallops  Respiratory:  Decreased breath sounds but clear to auscultation bilaterally, normal work of breathing GI: soft, nontender, nondistended, + BS Ext: plus 3 edema, without cyanosis, clubbing, or edema, Good distal pulses bilaterally Neuro:  Alert and Oriented x 3,  Psych: euthymic mood, full affect  Wt Readings from Last 3 Encounters:  11/15/20 287 lb 9.6 oz (130.5 kg)  10/12/20 290 lb 6.4 oz (131.7 kg)  10/01/20 277 lb 12.5 oz (126 kg)      Studies/Labs Reviewed:   EKG:  EKG is not ordered today.    Recent Labs: 06/15/2020: TSH 2.093 09/27/2020: Magnesium 1.8 09/30/2020: ALT 18 10/01/2020: Hemoglobin 10.0; Platelets 274 10/12/2020: B Natriuretic Peptide 231.0; BUN 75; Creatinine, Ser 2.30; Potassium 5.2; Sodium 141   Lipid Panel    Component Value Date/Time   CHOL 134 06/16/2020 0823   TRIG 258 (H) 06/16/2020 0823   HDL 51 06/16/2020 0823   CHOLHDL 2.6 06/16/2020 0823   VLDL 52 (H) 06/16/2020 0823   LDLCALC 31 06/16/2020 0823    Additional studies/ records that were reviewed today include:  04/06/2020   IMPRESSIONS     1. Left ventricular ejection fraction, by estimation, is 50%. The left  ventricle has normal function. The left ventricle has no regional wall  motion abnormalities. There is severe left ventricular hypertrophy. Left  ventricular diastolic parameters are  consistent with Grade II diastolic dysfunction (pseudonormalization).  Elevated left atrial pressure.   2. Right ventricular  systolic function is normal. The right ventricular  size is normal.   3. Left atrial size was mildly dilated.   4. Large pleural effusion in the left lateral region.   5. The mitral valve is normal in structure. Trivial mitral valve  regurgitation. No evidence of mitral stenosis.   6. The aortic valve is tricuspid. Aortic valve regurgitation is not  visualized. No aortic stenosis is present.   7. The inferior vena cava is normal in size with greater than 50%  respiratory variability, suggesting right atrial pressure of 3 mmHg.   FINDINGS   Left Ventricle: Left ventricular ejection fraction, by estimation, is  50%. The left ventricle has normal function. The left ventricle has no  regional wall motion abnormalities. The left ventricular internal cavity  size was normal in size. There is  severe left ventricular hypertrophy. Left ventricular diastolic parameters  are consistent with Grade II diastolic dysfunction (pseudonormalization).  Elevated left atrial pressure.   Right Ventricle: The right ventricular size is normal. No increase in  right ventricular wall thickness. Right ventricular systolic function is  normal.   Left Atrium: Left atrial size was mildly dilated.   Right Atrium: Right atrial size was normal in size.   Pericardium: There is no evidence of pericardial effusion.   Mitral Valve: The mitral valve is normal in structure. Trivial mitral  valve regurgitation. No evidence of mitral valve stenosis.   Tricuspid Valve: The tricuspid valve is normal in structure. Tricuspid  valve regurgitation is not demonstrated. No evidence of tricuspid  stenosis.   Aortic Valve: The aortic valve is tricuspid. Aortic valve regurgitation is  not visualized. No aortic stenosis is present. Aortic valve mean gradient  measures 2.0 mmHg. Aortic valve peak gradient measures 3.9 mmHg. Aortic  valve area, by VTI measures 2.73  cm.   Pulmonic Valve: The pulmonic valve was not well  visualized. Pulmonic valve  regurgitation is not visualized. No evidence of pulmonic stenosis.   Aorta: The aortic root is normal in size and structure.   Pulmonary Artery: Indeterminant PASP, inadequate TR jet.   Venous: The inferior vena cava is normal in size with greater than 50%  respiratory variability, suggesting right atrial pressure of 3 mmHg.   IAS/Shunts: No atrial level shunt detected by color flow Doppler.   Additional Comments: There is a large pleural effusion in the left lateral  region.           Risk Assessment/Calculations:         ASSESSMENT:    1. Medication management   2. Acute on chronic diastolic CHF (congestive heart failure) (HCC)   3. Stage 3 chronic kidney disease, unspecified whether stage 3a or 3b CKD ([place])   4. Essential hypertension   5. Other hyperlipidemia   6. Type 2 diabetes with nephropathy ([place])   7. OSA (obstructive sleep apnea)      PLAN:  In order of problems listed above:  Acute on Chronic diastolic CHF discharge 8/92/1194 weight 278 down from 298 pounds  back up to 290 lbs. With increasing edema and elevated bnp I added Zaroxolyn 2.5 mg to take once a week and referred to AHF clinic in [place]. Weight today 287. Still short of breath and significant edema. Increase Zaroxolyn 25 mg twice weekly and reach out to CHF clinic for appt soon.   CKD stage III creatinine 2.3 10/12/2020-he missed an appt with renal last Wed. Has been taking his dad to cancer center. I've asked him to call and reschedule asap   Right pleural effusion with possible CAP treated with antibiotics with plans for repeat chest x-ray in 4 to 6 weeks   Hypertension BP high today. Eats salty foods when wife is at work.    HLD on Pravachol   DM type II   OSA is to have a sleep study once heart failure compensated -will schedule since this could help with treatment.     Shared Decision Making/Informed Consent        Medication Adjustments/Labs and  Tests Ordered: Current medicines are reviewed at length with the patient today.  Concerns regarding medicines are outlined above.  Medication  changes, Labs and Tests ordered today are listed in the Patient Instructions below. Patient Instructions  Medication Instructions:  Your physician has recommended you make the following change in your medication:   Increase Metolazone to 2.5 mg on Monday and Friday.   *If you need a refill on your cardiac medications before your next appointment, please call your pharmacy*   Lab Work: Your physician recommends that you return for lab work in: Today   If you have labs (blood work) drawn today and your tests are completely normal, you will receive your results only by: Marland Kitchen MyChart Message (if you have MyChart) OR . A paper copy in the mail If you have any lab test that is abnormal or we need to change your treatment, we will call you to review the results.   Testing/Procedures: NONE     Follow-Up: At [facility], you and your health needs are our priority.  As part of our continuing mission to provide you with exceptional heart care, we have created designated Provider Care Teams.  These Care Teams include your primary Cardiologist (physician) and Advanced Practice Providers (APPs -  Physician Assistants and Nurse Practitioners) who all work together to provide you with the care you need, when you need it.  We recommend signing up for the patient portal called "MyChart".  Sign up information is provided on this After Visit Summary.  MyChart is used to connect with patients for Virtual Visits (Telemedicine).  Patients are able to view lab/test results, encounter notes, upcoming appointments, etc.  Non-urgent messages can be sent to your provider as well.   To learn more about what you can do with MyChart, go to [email or web address].    Your next appointment:    Next appointment   The format for your next appointment:   In Person  Provider:    Carlyle Dolly, MD   Other Instructions [name] ( Kidney Doctor) [phone]     Signed, Ermalinda Barrios, PA-C  11/15/2020 1:01 PM    [facility] [place], [place], [place]  [address] Phone: [phone]; Fax: [phone]

## 2020-11-09 ENCOUNTER — Telehealth: Payer: Self-pay | Admitting: Cardiology

## 2020-11-09 NOTE — Telephone Encounter (Signed)
New Message    Dr Karie Kirks office called this in [phone] they would like to speak to you about adjusting patient medication his BP in office today was 191/112 left arm sitting and 182/102 right arm sitting   Please call patient as well and advise how to proceed

## 2020-11-10 MED ORDER — LABETALOL HCL 200 MG PO TABS: 400.0000 mg | ORAL_TABLET | Freq: Two times a day (BID) | ORAL | 3 refills | Status: DC

## 2020-11-10 NOTE — Telephone Encounter (Signed)
Patient's phone is not working, so I talked to his spouse who voiced understanding at medication increase. Patient's spouse agreed to update our office in 1 week with bp readings as patient has a home cuff. Patient also has a 1 month f/u appointment with Gerrianne Scale, PA-C on 11/15/2020, which spouse confirmed.

## 2020-11-10 NOTE — Telephone Encounter (Signed)
Increase labetalol to 400mg  bid. If has home bp cuff update [place] 1 week, if not nursing visit 1 week for vitals check   Zandra Abts MD

## 2020-11-15 ENCOUNTER — Other Ambulatory Visit (HOSPITAL_COMMUNITY)
Admission: RE | Admit: 2020-11-15 | Discharge: 2020-11-15 | Disposition: A | Discharge status: Home / Self Care | Payer: Commercial Managed Care - PPO | Source: Ambulatory Visit | Attending: Physician Assistant | Admitting: Physician Assistant

## 2020-11-15 ENCOUNTER — Other Ambulatory Visit: Payer: Self-pay

## 2020-11-15 ENCOUNTER — Encounter: Payer: Self-pay | Admitting: Physician Assistant

## 2020-11-15 ENCOUNTER — Ambulatory Visit (INDEPENDENT_AMBULATORY_CARE_PROVIDER_SITE_OTHER): Payer: Commercial Managed Care - PPO | Admitting: Physician Assistant

## 2020-11-15 VITALS — BP 172/102 | HR 92 | Ht 71.0 in | Wt 287.6 lb

## 2020-11-15 DIAGNOSIS — I5033 Acute on chronic diastolic (congestive) heart failure: Secondary | ICD-10-CM

## 2020-11-15 DIAGNOSIS — I10 Essential (primary) hypertension: Secondary | ICD-10-CM | POA: Diagnosis not present

## 2020-11-15 DIAGNOSIS — Z79899 Other long term (current) drug therapy: Secondary | ICD-10-CM

## 2020-11-15 DIAGNOSIS — E1121 Type 2 diabetes mellitus with diabetic nephropathy: Secondary | ICD-10-CM

## 2020-11-15 DIAGNOSIS — N183 Chronic kidney disease, stage 3 unspecified: Secondary | ICD-10-CM

## 2020-11-15 DIAGNOSIS — G4733 Obstructive sleep apnea (adult) (pediatric): Secondary | ICD-10-CM

## 2020-11-15 DIAGNOSIS — E7849 Other hyperlipidemia: Secondary | ICD-10-CM

## 2020-11-15 LAB — BASIC METABOLIC PANEL
Anion gap: 6 (ref 5–15)
BUN: 50 mg/dL — ABNORMAL HIGH (ref 6–20)
CO2: 25 mmol/L (ref 22–32)
Calcium: 8.1 mg/dL — ABNORMAL LOW (ref 8.9–10.3)
Chloride: 105 mmol/L (ref 98–111)
Creatinine, Ser: 2.78 mg/dL — ABNORMAL HIGH (ref 0.61–1.24)
GFR, Estimated: 26 mL/min — ABNORMAL LOW (ref >60)
Glucose, Bld: 147 mg/dL — ABNORMAL HIGH (ref 70–99)
Potassium: 4.4 mmol/L (ref 3.5–5.1)
Sodium: 136 mmol/L (ref 135–145)

## 2020-11-15 LAB — BRAIN NATRIURETIC PEPTIDE: B Natriuretic Peptide: 308 pg/mL — ABNORMAL HIGH (ref 0.0–100.0)

## 2020-11-15 MED ORDER — METOLAZONE 2.5 MG PO TABS: ORAL_TABLET | ORAL | 11 refills | Status: DC

## 2020-11-15 NOTE — Patient Instructions (Signed)
Medication Instructions:  Your physician has recommended you make the following change in your medication:   Increase Metolazone to 2.5 mg on Monday and Friday.   *If you need a refill on your cardiac medications before your next appointment, please call your pharmacy*   Lab Work: Your physician recommends that you return for lab work in: Today   If you have labs (blood work) drawn today and your tests are completely normal, you will receive your results only by: Marland Kitchen MyChart Message (if you have MyChart) OR . A paper copy in the mail If you have any lab test that is abnormal or we need to change your treatment, we will call you to review the results.   Testing/Procedures: NONE     Follow-Up: At [facility], you and your health needs are our priority.  As part of our continuing mission to provide you with exceptional heart care, we have created designated Provider Care Teams.  These Care Teams include your primary Cardiologist (physician) and Advanced Practice Providers (APPs -  Physician Assistants and Nurse Practitioners) who all work together to provide you with the care you need, when you need it.  We recommend signing up for the patient portal called "MyChart".  Sign up information is provided on this After Visit Summary.  MyChart is used to connect with patients for Virtual Visits (Telemedicine).  Patients are able to view lab/test results, encounter notes, upcoming appointments, etc.  Non-urgent messages can be sent to your provider as well.   To learn more about what you can do with MyChart, go to [email or web address].    Your next appointment:    Next appointment   The format for your next appointment:   In Person  Provider:   Carlyle Dolly, MD   Other Instructions Theador Hawthorne ( Kidney Doctor) [phone]

## 2020-11-16 ENCOUNTER — Telehealth: Payer: Self-pay | Admitting: *Deleted

## 2020-11-16 DIAGNOSIS — N183 Chronic kidney disease, stage 3 unspecified: Secondary | ICD-10-CM

## 2020-11-16 DIAGNOSIS — I5033 Acute on chronic diastolic (congestive) heart failure: Secondary | ICD-10-CM

## 2020-11-16 NOTE — Telephone Encounter (Signed)
-----   Message from Imogene Burn, PA-C sent at 11/15/2020  2:23 PM EDT ----- Kidney function up from last week. Please f/u with renal tomorrow and try to get him in this week if possible. I don't think we can rely in him to make appt. Heart failure clinic should also be contacting him. Repeat bmet in 1 week unless he can get in with renal before. thanks

## 2020-11-16 NOTE — Telephone Encounter (Signed)
Per phone call from [place] at Dr. Toya Smothers office    -pt missed his apt last week - they are going to schedule him for  -    12/08/2020 @ 1:00

## 2020-11-16 NOTE — Telephone Encounter (Signed)
Pt notified and Dr. Toya Smothers office called to schedule appt. No answer left msg to call back.

## 2020-11-17 NOTE — Telephone Encounter (Signed)
Pt notified of appt time, date and location. Pt voiced understanding and repeated appt time and Date.

## 2020-11-18 ENCOUNTER — Telehealth (HOSPITAL_COMMUNITY): Payer: Self-pay | Admitting: Vascular Surgery

## 2020-11-18 NOTE — Telephone Encounter (Signed)
Called pt tto make new chf apt, pt MAIL IS FULL will try back later

## 2020-11-23 ENCOUNTER — Other Ambulatory Visit: Payer: Self-pay

## 2020-11-23 ENCOUNTER — Other Ambulatory Visit (HOSPITAL_COMMUNITY)
Admission: RE | Admit: 2020-11-23 | Discharge: 2020-11-23 | Disposition: A | Discharge status: Home / Self Care | Payer: Commercial Managed Care - PPO | Source: Ambulatory Visit | Attending: Physician Assistant | Admitting: Physician Assistant

## 2020-11-23 DIAGNOSIS — I5033 Acute on chronic diastolic (congestive) heart failure: Secondary | ICD-10-CM | POA: Diagnosis not present

## 2020-11-23 DIAGNOSIS — N183 Chronic kidney disease, stage 3 unspecified: Secondary | ICD-10-CM | POA: Diagnosis present

## 2020-11-23 LAB — BASIC METABOLIC PANEL
Anion gap: 6 (ref 5–15)
BUN: 55 mg/dL — ABNORMAL HIGH (ref 6–20)
CO2: 27 mmol/L (ref 22–32)
Calcium: 8.3 mg/dL — ABNORMAL LOW (ref 8.9–10.3)
Chloride: 106 mmol/L (ref 98–111)
Creatinine, Ser: 2.65 mg/dL — ABNORMAL HIGH (ref 0.61–1.24)
GFR, Estimated: 28 mL/min — ABNORMAL LOW (ref >60)
Glucose, Bld: 136 mg/dL — ABNORMAL HIGH (ref 70–99)
Potassium: 4.5 mmol/L (ref 3.5–5.1)
Sodium: 139 mmol/L (ref 135–145)

## 2020-11-29 ENCOUNTER — Encounter (HOSPITAL_COMMUNITY): Payer: Self-pay | Admitting: Cardiology

## 2020-11-29 ENCOUNTER — Ambulatory Visit (HOSPITAL_COMMUNITY)
Admission: RE | Admit: 2020-11-29 | Discharge: 2020-11-29 | Disposition: A | Discharge status: Home / Self Care | Payer: Commercial Managed Care - PPO | Source: Ambulatory Visit | Attending: Cardiology | Admitting: Cardiology

## 2020-11-29 ENCOUNTER — Other Ambulatory Visit (HOSPITAL_COMMUNITY): Payer: Self-pay | Admitting: *Deleted

## 2020-11-29 ENCOUNTER — Other Ambulatory Visit: Payer: Self-pay

## 2020-11-29 VITALS — BP 160/78 | HR 88 | Wt 292.8 lb

## 2020-11-29 DIAGNOSIS — I130 Hypertensive heart and chronic kidney disease with heart failure and stage 1 through stage 4 chronic kidney disease, or unspecified chronic kidney disease: Secondary | ICD-10-CM | POA: Insufficient documentation

## 2020-11-29 DIAGNOSIS — Z7984 Long term (current) use of oral hypoglycemic drugs: Secondary | ICD-10-CM | POA: Diagnosis not present

## 2020-11-29 DIAGNOSIS — I10 Essential (primary) hypertension: Secondary | ICD-10-CM | POA: Diagnosis not present

## 2020-11-29 DIAGNOSIS — I701 Atherosclerosis of renal artery: Secondary | ICD-10-CM | POA: Diagnosis not present

## 2020-11-29 DIAGNOSIS — N183 Chronic kidney disease, stage 3 unspecified: Secondary | ICD-10-CM | POA: Diagnosis not present

## 2020-11-29 DIAGNOSIS — Z79899 Other long term (current) drug therapy: Secondary | ICD-10-CM | POA: Insufficient documentation

## 2020-11-29 DIAGNOSIS — Z833 Family history of diabetes mellitus: Secondary | ICD-10-CM | POA: Diagnosis not present

## 2020-11-29 DIAGNOSIS — E1122 Type 2 diabetes mellitus with diabetic chronic kidney disease: Secondary | ICD-10-CM | POA: Insufficient documentation

## 2020-11-29 DIAGNOSIS — I5032 Chronic diastolic (congestive) heart failure: Secondary | ICD-10-CM | POA: Diagnosis not present

## 2020-11-29 LAB — BASIC METABOLIC PANEL
Anion gap: 4 — ABNORMAL LOW (ref 5–15)
BUN: 56 mg/dL — ABNORMAL HIGH (ref 6–20)
CO2: 25 mmol/L (ref 22–32)
Calcium: 8.2 mg/dL — ABNORMAL LOW (ref 8.9–10.3)
Chloride: 111 mmol/L (ref 98–111)
Creatinine, Ser: 2.76 mg/dL — ABNORMAL HIGH (ref 0.61–1.24)
GFR, Estimated: 26 mL/min — ABNORMAL LOW (ref >60)
Glucose, Bld: 162 mg/dL — ABNORMAL HIGH (ref 70–99)
Potassium: 4.6 mmol/L (ref 3.5–5.1)
Sodium: 140 mmol/L (ref 135–145)

## 2020-11-29 MED ORDER — TORSEMIDE 60 MG PO TABS: 60.0000 mg | ORAL_TABLET | Freq: Two times a day (BID) | ORAL | 3 refills | Status: DC

## 2020-11-29 MED ORDER — TORSEMIDE 20 MG PO TABS: 60.0000 mg | ORAL_TABLET | Freq: Two times a day (BID) | ORAL | 6 refills | Status: DC

## 2020-11-29 MED ORDER — EMPAGLIFLOZIN 10 MG PO TABS: 10.0000 mg | ORAL_TABLET | Freq: Every day | ORAL | 11 refills | Status: DC

## 2020-11-29 MED ORDER — LABETALOL HCL 300 MG PO TABS: 600.0000 mg | ORAL_TABLET | Freq: Two times a day (BID) | ORAL | 3 refills | Status: DC

## 2020-11-29 NOTE — Patient Instructions (Signed)
INCREASE Torsemide to 60 mg twice a day START Jardiance 10mg  one tab daily INCREASE Labeltalol to 600 mg twice a day   You have been referred to [place] for further CHF management in the comfort of your home. A team member will be in contact with you to arrange a home visit  Labs today We will only contact you if something comes back abnormal or we need to make some changes. Otherwise no news is good news!  Labs needed in 7-10 days  You have been ordered a PYP Scan.  This is done in the Radiology Department of [facility].  When you come for this test please plan to be there 2-3 hours.  Do the following things EVERYDAY: 1) Weigh yourself in the morning before breakfast. Write it down and keep it in a log. 2) Take your medicines as prescribed 3) Eat low salt foods--Limit salt (sodium) to 2000 mg per day.  4) Stay as active as you can everyday 5) Limit all fluids for the day to less than 2 liters  At the [facility], you and your health needs are our priority. As part of our continuing mission to provide you with exceptional heart care, we have created designated Provider Care Teams. These Care Teams include your primary Cardiologist (physician) and Advanced Practice Providers (APPs- Physician Assistants and Nurse Practitioners) who all work together to provide you with the care you need, when you need it.   You may see any of the following providers on your designated Care Team at your next follow up: Marland Kitchen Dr Glori Bickers . Dr Loralie Champagne . Dr Vickki Muff . Darrick Grinder, NP . Lyda Jester, [place] . Audry Riles, PharmD   Please be sure to bring in all your medications bottles to every appointment.

## 2020-11-29 NOTE — Progress Notes (Signed)
Heart and Vascular Care Navigation  11/29/2020  [name] 25-Sep-1965 [id number]  Reason for Referral: insurance assistance and [facility] Paramedicine referral for medication management   Engaged with patient face to face for initial visit for Heart and Vascular Care Coordination.                                                                                                   Assessment:  CSW met with pt and pt grandson during clinic visit to discuss above concerns.  Pt reports he was working up until recent medical concerns took him out of work.  Has now depleted his short term disability benefit and has officially lost insurance coverage at this time.  CSW discussed patient potentially applying for [facility] insurance with pt- pt should qualify for a Qualifying Life Event since he just lost insurance.  Also provided pt with CAFA application to try to get help with current and past bills.  Pt now without prescription coverage so discussed referral to [facility] to help with establishing with their pharmacy assistance program and getting in with providers who can see without insurance.  CSW also discussed pt applying for food stamps.  Pt states that living on just his spouses income has been a hard adjustment and though they are managing everything ok right now they are worried about paying for things in the future.  CSW placed referral to [facility] for food stamp assistance to hopefully get pt and wife some funds to help with food expenses so they have more money available to help with other bills.                                 HRT/VAS Care Coordination    Outpatient Care Team [facility] Paramedic Name: referral sent to [facility] program on 11/29/20   Living arrangements for the past 2 months Single Family Home   Lives with: Spouse   Patient Current Insurance Coverage Self-Pay   Patient Has Concern With Paying Medical Bills Yes    Patient Concerns With Medical Bills just lost insurance that he had through employer   Medical Bill Referrals: CAFA and ACA   Does Patient Have Prescription Coverage? No   Patient Prescription Assistance Programs Other   Other Assistance Programs Medications [place] referral sent on 11/29/20   Home Assistive Devices/Equipment None   DME Agency NA   [facility] Agency NA      Social History:                                                                             SDOH Screenings   Alcohol Screen: Not on file  Depression (KXF8-1): Not on file  Financial  Resource Strain: High Risk  . Difficulty of Paying Living Expenses: Hard  Food Insecurity: Food Insecurity Present  . Worried About Charity fundraiser in the Last Year: Sometimes true  . Ran Out of Food in the Last Year: Never true  Housing: Low Risk   . Last Housing Risk Score: 0  Physical Activity: Not on file  Social Connections: Not on file  Stress: Not on file  Tobacco Use: Low Risk   . Smoking Tobacco Use: Never Smoker  . Smokeless Tobacco Use: Never Used  Transportation Needs: No Transportation Needs  . Lack of Transportation (Medical): No  . Lack of Transportation (Non-Medical): No    SDOH Interventions: Financial Resources:  Sales promotion account executive Interventions: [id number] Referral   Food Insecurity:  Food Insecurity Interventions: [employer] Referral  Housing Insecurity:  Housing Interventions: Intervention Not Indicated  Transportation:   Transportation Interventions: Intervention Not Indicated    Follow-up plan:     Pt to complete CAFA and look into [employer].  CSW to follow up with pt in 2-3 weeks to check on progress with above and see if he had heard from other referrals that CSW made.  Will continue to follow and assist as needed  Jorge Ny, [facility] Desk#: [phone] Cell#: [phone]

## 2020-11-29 NOTE — Progress Notes (Signed)
PCP: [name], MD Cardiology: Dr. Harl Bowie HF Cardiology: Dr. Aundra Dubin  55 y.o. with history of HTN, type 2 diabetes, CKD stage 3, and chronic diastolic CHF was referred by Dr. Harl Bowie for evaluation of diastolic CHF.  For > 1 year now, patient has had significant exertional dyspnea.  He has been admitted several times in the last year, most recently in 3/22.  Echo in 9/21 showed EF 50%, moderate LVH, grade 2 diastolic dysfunction, normal RV size and systolic function.  Most recent creatinine was 2.65, he is followed by nephrology. He is currently taking torsemide 40 mg bid and metolazone 2.5 mg twice a week, recently increased from once a week.    Today, BP is elevated.  He remains markedly symptomatic.  He is short of breath walking around the house or walking the 3-4 steps to get up to his front door.  He used a wheelchair to come into the office today.  He has 3 pillow orthopnea and has episodes of PND.  He denies chest pain.  He has daytime sleepiness and snoring.    ECG (personally reviewed, 3/22): NSR, right axis deviation, nonspecific T wave changes  Labs (5/22): K 4.5, creatinine 2.65  PMH; 1. HTN 2. Type 2 diabetes 3. CKD stage 3: Suspect diabetic nephropathy.   4. Chronic diastolic CHF: Echo in 1/44 with EF 50%, moderate LVH, grade 2 diastolic dysfunction, normal RV size and systolic function.   Social History   Socioeconomic History  . Marital status: Married    Spouse name: Not on file  . Number of children: Not on file  . Years of education: Not on file  . Highest education level: Not on file  Occupational History  . Not on file  Tobacco Use  . Smoking status: Never Smoker  . Smokeless tobacco: Never Used  Vaping Use  . Vaping Use: Never used  Substance and Sexual Activity  . Alcohol use: No  . Drug use: No  . Sexual activity: Not on file  Other Topics Concern  . Not on file  Social History Narrative  . Not on file   Social Determinants of Health    Financial Resource Strain: High Risk  . Difficulty of Paying Living Expenses: Hard  Food Insecurity: Food Insecurity Present  . Worried About Charity fundraiser in the Last Year: Sometimes true  . Ran Out of Food in the Last Year: Never true  Transportation Needs: No Transportation Needs  . Lack of Transportation (Medical): No  . Lack of Transportation (Non-Medical): No  Physical Activity: Not on file  Stress: Not on file  Social Connections: Not on file  Intimate Partner Violence: Not on file   Family History  Problem Relation Age of Onset  . Dementia Mother   . Diabetes Father    ROS: All systems reviewed and negative except as per HPI.   Current Outpatient Medications  Medication Sig Dispense Refill  . acetaminophen (TYLENOL) 325 MG tablet Take 2 tablets (650 mg total) by mouth every 6 (six) hours as needed for mild pain (or Fever >/= 101). 12 tablet 1  . amLODipine (NORVASC) 10 MG tablet Take 1 tablet (10 mg total) by mouth daily. 90 tablet 3  . azithromycin (ZITHROMAX) 500 MG tablet Take 500 mg by mouth daily.    . cefdinir (OMNICEF) 300 MG capsule Take 300 mg by mouth 2 (two) times daily.    . empagliflozin (JARDIANCE) 10 MG TABS tablet Take 1 tablet (10 mg total) by  mouth daily before breakfast. 30 tablet 11  . Garlic Oil 8242 MG CAPS Take by mouth.    Marland Kitchen glimepiride (AMARYL) 4 MG tablet Take 4 mg by mouth 2 (two) times daily.    . hydrALAZINE (APRESOLINE) 100 MG tablet Take 1 tablet (100 mg total) by mouth 3 (three) times daily. 90 tablet 3  . metolazone (ZAROXOLYN) 2.5 MG tablet Take 2.5 mg Two Times Weekly on Monday and Friday 8 tablet 11  . Multiple Vitamin (MULTIVITAMIN WITH MINERALS) TABS tablet Take 1 tablet by mouth daily.    . potassium chloride (KLOR-CON) 10 MEQ tablet Take 1 tablet (10 mEq total) by mouth daily. Take While taking Demadex/Torsemide 30 tablet 2  . pravastatin (PRAVACHOL) 40 MG tablet Take 40 mg by mouth in the morning.     . Zinc 50 MG CAPS Take  by mouth.    . labetalol (NORMODYNE) 300 MG tablet Take 2 tablets (600 mg total) by mouth 2 (two) times daily. 120 tablet 3  . torsemide (DEMADEX) 20 MG tablet Take 3 tablets (60 mg total) by mouth 2 (two) times daily. 180 tablet 6   No current facility-administered medications for this encounter.   BP (!) 160/78   Pulse 88   Wt 132.8 kg (292 lb 12.8 oz)   SpO2 95%   BMI 40.84 kg/m  General: NAD Neck: JVP 10 cm, no thyromegaly or thyroid nodule.  Lungs: Clear to auscultation bilaterally with normal respiratory effort. CV: Nondisplaced PMI.  Heart regular S1/S2, no S3/S4, no murmur.  2+ edema to knees.  No carotid bruit.  Difficult to palpate pedal pulses.  Abdomen: Soft, nontender, no hepatosplenomegaly, no distention.  Skin: Intact without lesions or rashes.  Neurologic: Alert and oriented x 3.  Psych: Normal affect. Extremities: No clubbing or cyanosis.  HEENT: Normal.   Assessment/Plan: 1. Chronic diastolic CHF: Most recent echo in 9/21 showed EF 50%, moderate LVH, grade 2 diastolic dysfunction, normal RV size and systolic function.  On exam, patient is significantly volume overloaded today with NYHA class IIIb symptoms chronically.  He has been admitted about 4 times in the last year.  Cardiac amyloidosis (most likely transthyretin) is a concern.   - Increase torsemide to 60 mg bid.  Continue metolazone biw.  BMET today and in 10 days.  - GFR is > 20, will add Jardiance 10 mg daily.   - Workup for cardiac amyloidosis => PYP scan, myeloma panel, serum free light chains, urine immunofixation.  - He would be a good Cardiomems candidate but does not have insurance at this time.  2. CKD stage 3: BMET today.  - As above, adding Jardiance.  3. Suspect OSA: I will try to arrange for home sleep study.  4. HTN: BP remains elevated.  - Increase labetalol to 600 mg bid.  - If BMET remains stable, would consider addition of spironolactone next.  - I will arrange for renal artery dopplers to  assess for renal artery stenosis.   Social work will see him today to help with insurance, disability, etc.   Followup in 2 wks, if he has not improved may need admission again.   Loralie Champagne 11/29/2020

## 2020-11-30 ENCOUNTER — Other Ambulatory Visit (HOSPITAL_COMMUNITY): Payer: Self-pay

## 2020-12-01 ENCOUNTER — Other Ambulatory Visit (HOSPITAL_COMMUNITY): Payer: Self-pay

## 2020-12-02 LAB — MULTIPLE MYELOMA PANEL, SERUM
Albumin SerPl Elph-Mcnc: 3.2 g/dL (ref 2.9–4.4)
Albumin/Glob SerPl: 1.3 (ref 0.7–1.7)
Alpha 1: 0.2 g/dL (ref 0.0–0.4)
Alpha2 Glob SerPl Elph-Mcnc: 0.5 g/dL (ref 0.4–1.0)
B-Globulin SerPl Elph-Mcnc: 0.8 g/dL (ref 0.7–1.3)
Gamma Glob SerPl Elph-Mcnc: 1.1 g/dL (ref 0.4–1.8)
Globulin, Total: 2.6 g/dL (ref 2.2–3.9)
IgA: 178 mg/dL (ref 90–386)
IgG (Immunoglobin G), Serum: 1259 mg/dL (ref 603–1613)
IgM (Immunoglobulin M), Srm: 39 mg/dL (ref 20–172)
Total Protein ELP: 5.8 g/dL — ABNORMAL LOW (ref 6.0–8.5)

## 2020-12-09 ENCOUNTER — Telehealth (HOSPITAL_COMMUNITY): Payer: Self-pay | Admitting: Surgery

## 2020-12-09 NOTE — Telephone Encounter (Signed)
I spoke with patient about his loss of insurance and that he had taken a home sleep study device from clinic during his last visit.  We discussed that he could not pay for the exam out of pocket and therefore will return the home sleep study when he returns to the [facility].

## 2020-12-10 ENCOUNTER — Encounter (HOSPITAL_COMMUNITY): Admission: RE | Admit: 2020-12-10 | Payer: Commercial Managed Care - PPO | Source: Ambulatory Visit

## 2020-12-14 ENCOUNTER — Encounter (HOSPITAL_COMMUNITY): Payer: Commercial Managed Care - PPO | Admitting: Cardiology

## 2020-12-16 ENCOUNTER — Other Ambulatory Visit (HOSPITAL_COMMUNITY): Payer: Self-pay

## 2020-12-16 ENCOUNTER — Telehealth (HOSPITAL_COMMUNITY): Payer: Self-pay | Admitting: Pharmacy Technician

## 2020-12-16 NOTE — Telephone Encounter (Signed)
Advanced Heart Failure Patient Advocate Encounter  Advanced Heart Failure Patient Advocate Encounter  Prior Authorization for [name] has been submitted and approved.    PA# [id number] Effective dates: 12/16/20 through 12/16/21  Patients co-pay is $0 (30 day billing)  Charlann Boxer, CPhT

## 2020-12-23 ENCOUNTER — Telehealth (HOSPITAL_COMMUNITY): Payer: Self-pay | Admitting: *Deleted

## 2020-12-23 NOTE — Telephone Encounter (Signed)
[place] paramedic called clinic social worker Eliezer Lofts concerning pts bp and some other issues. Jenna asked me to call paramedic back at [phone]. I spoke with paramedic and was told pts bp was 200/102 pt has not been compliant with medications last refills on all medications was in March for 30 day supply on all meds. Pt is still taking medications from that last refill. Pt has BLEE, a distended abdomen, and shortness of breath. Paramedic educated pt on how to take medications and refilled his pill box. Paramedic will go to patients home this afternoon to see if he is taking medications correctly and check vitals. Pt missed his two week f/u 5/24 we will r/s appt.  Routed to [place] for further advice

## 2020-12-23 NOTE — Telephone Encounter (Signed)
Agree with plan, restart meds and needs followup.

## 2020-12-27 ENCOUNTER — Telehealth (HOSPITAL_COMMUNITY): Payer: Self-pay | Admitting: Licensed Clinical Social Worker

## 2020-12-27 NOTE — Telephone Encounter (Signed)
CSW called pt to check in regarding referrals made with pt during clinic visit last month.  Pt confirms that he has been working with [employer] which we referred pt to during last visit and that this has been very helpful.  CSW had also made referral to [facility] for assistance with food stamps- he confirms that he heard from them and as able to fill out an application- now just awaiting determination letter.  Pt reports he has no other concerns at this time that he needs assistance with- stated he set up appt this week to come to clinic- CSW confirmed pt appt time and date with pt over the phone and pt expressed no concerns with getting here.  Will continue to follow and assist as needed  Jorge Ny, [facility] Desk#: [phone] Cell#: [phone]

## 2020-12-27 NOTE — Telephone Encounter (Signed)
Patient scheduled for 6/8

## 2020-12-29 ENCOUNTER — Other Ambulatory Visit: Payer: Self-pay

## 2020-12-29 ENCOUNTER — Ambulatory Visit (HOSPITAL_COMMUNITY)
Admission: RE | Admit: 2020-12-29 | Discharge: 2020-12-29 | Disposition: A | Discharge status: Home / Self Care | Payer: Commercial Managed Care - PPO | Source: Ambulatory Visit | Attending: Cardiology | Admitting: Cardiology

## 2020-12-29 ENCOUNTER — Telehealth (HOSPITAL_COMMUNITY): Payer: Self-pay

## 2020-12-29 ENCOUNTER — Encounter (HOSPITAL_COMMUNITY): Payer: Self-pay | Admitting: Cardiology

## 2020-12-29 VITALS — BP 100/62 | HR 84 | Wt 287.0 lb

## 2020-12-29 DIAGNOSIS — I130 Hypertensive heart and chronic kidney disease with heart failure and stage 1 through stage 4 chronic kidney disease, or unspecified chronic kidney disease: Secondary | ICD-10-CM | POA: Insufficient documentation

## 2020-12-29 DIAGNOSIS — I5032 Chronic diastolic (congestive) heart failure: Secondary | ICD-10-CM | POA: Diagnosis present

## 2020-12-29 DIAGNOSIS — Z79899 Other long term (current) drug therapy: Secondary | ICD-10-CM | POA: Diagnosis not present

## 2020-12-29 DIAGNOSIS — E1122 Type 2 diabetes mellitus with diabetic chronic kidney disease: Secondary | ICD-10-CM | POA: Insufficient documentation

## 2020-12-29 DIAGNOSIS — Z7984 Long term (current) use of oral hypoglycemic drugs: Secondary | ICD-10-CM | POA: Insufficient documentation

## 2020-12-29 DIAGNOSIS — N184 Chronic kidney disease, stage 4 (severe): Secondary | ICD-10-CM | POA: Diagnosis not present

## 2020-12-29 LAB — BASIC METABOLIC PANEL
Anion gap: 10 (ref 5–15)
BUN: 77 mg/dL — ABNORMAL HIGH (ref 6–20)
CO2: 26 mmol/L (ref 22–32)
Calcium: 8.4 mg/dL — ABNORMAL LOW (ref 8.9–10.3)
Chloride: 103 mmol/L (ref 98–111)
Creatinine, Ser: 4.4 mg/dL — ABNORMAL HIGH (ref 0.61–1.24)
GFR, Estimated: 15 mL/min — ABNORMAL LOW (ref >60)
Glucose, Bld: 85 mg/dL (ref 70–99)
Potassium: 4.7 mmol/L (ref 3.5–5.1)
Sodium: 139 mmol/L (ref 135–145)

## 2020-12-29 MED ORDER — TORSEMIDE 20 MG PO TABS: 80.0000 mg | ORAL_TABLET | Freq: Two times a day (BID) | ORAL | 11 refills | Status: DC

## 2020-12-29 MED ORDER — HYDRALAZINE HCL 100 MG PO TABS
50.0000 mg | ORAL_TABLET | Freq: Three times a day (TID) | ORAL | 11 refills | Status: DC
Start: 2020-12-29 — End: 2021-01-19

## 2020-12-29 NOTE — Telephone Encounter (Signed)
-----   Message from Larey Dresser, MD sent at 12/29/2020  4:37 PM EDT ----- Creatinine is significantly higher.  Do not start empagliflozin.  He can increase torsemide to 80 mg bid but hold metolazone for a week (can take Friday of next week, do NOT take until then).  Difficult situation as he is volume overloaded by exam and still quite short of breath but creatinine rising.  Needs appointment with nephrology soon as well.  He may end up having to be admitted to sort this out.  Repeat BMET on Friday.  BP was low today, decrease hydralazine to 50 mg tid.

## 2020-12-29 NOTE — Progress Notes (Signed)
CSW met with patient and sister in the clinic at the request of Dr Aundra Dubin. Patient has been referred to [facility] and states he has had a visit by the paramedic. He also confirmed that his food stamp application was submitted but waiting to hear determination. Patient and sister asked about emergency food options and were provided local food banks during the visit. Patient also with no insurance and was provided information on CAFA and AHA. CSW inquired about follow up and patient unsure if any applications have been submitted. CSW encouraged patient to follow up and provided information/applications for CAFA and AHA. Patient and sister verbalize understanding of follow up and have CSW contact if further needs arise. [name], [place], [place]

## 2020-12-29 NOTE — Telephone Encounter (Signed)
Patient advised and verbalized understanding. Patient will have repeat blood work done at [facility]. Med list updated to reflect changes, lab order entered.   Orders Placed This Encounter  Procedures  . Basic metabolic panel    Standing Status:   Future    Standing Expiration Date:   12/29/2021    Order Specific Question:   Release to patient    Answer:   Immediate   Meds ordered this encounter  Medications  . hydrALAZINE (APRESOLINE) 100 MG tablet    Sig: Take 0.5 tablets (50 mg total) by mouth 3 (three) times daily.    Dispense:  45 tablet    Refill:  11    Please cancel all previous orders for current medication. Change in dosage or pill size.

## 2020-12-29 NOTE — Patient Instructions (Addendum)
Labs done today. We will contact you only if your labs are abnormal.  INCREASE Torsemide to 80mg  (4 tablets) by mouth 2 times daily.   On Monday June 13th please take 1 tablet of metolazone. Then continue taking 1 tablet by mouth every Friday.   You have been approved for patient assistance for Jardiance. Your co-pay is $0.  No other medication changes were made. Please continue all current medications as prescribed.  We will contact you to schedule your PYP Scan.   Your physician recommends that you schedule a follow-up appointment in: 10 days for a lab only appointment in [place] and in 3 weeks for an appointment with our APP Clinic here in our office   If you have any questions or concerns before your next appointment please send [place] a message through [place] or call our office at [phone].    TO LEAVE A MESSAGE FOR THE NURSE SELECT OPTION 2, PLEASE LEAVE A MESSAGE INCLUDING: . YOUR NAME . DATE OF BIRTH . CALL BACK NUMBER . REASON FOR CALL**this is important as we prioritize the call backs  YOU WILL RECEIVE A CALL BACK THE SAME DAY AS LONG AS YOU CALL BEFORE 4:00 PM   Do the following things EVERYDAY: 1) Weigh yourself in the morning before breakfast. Write it down and keep it in a log. 2) Take your medicines as prescribed 3) Eat low salt foods--Limit salt (sodium) to 2000 mg per day.  4) Stay as active as you can everyday 5) Limit all fluids for the day to less than 2 liters   At the [facility], you and your health needs are our priority. As part of our continuing mission to provide you with exceptional heart care, we have created designated Provider Care Teams. These Care Teams include your primary Cardiologist (physician) and Advanced Practice Providers (APPs- Physician Assistants and Nurse Practitioners) who all work together to provide you with the care you need, when you need it.   You may see any of the following providers on your designated Care  Team at your next follow up: Marland Kitchen Dr Glori Bickers . Dr Loralie Champagne . Darrick Grinder, NP . Lyda Jester, PA . Audry Riles, PharmD   Please be sure to bring in all your medications bottles to every appointment.

## 2020-12-30 LAB — PE (RFX IFE+FLC), S
A/G Ratio: 1 (ref 0.7–1.7)
Albumin ELP: 3 g/dL (ref 2.9–4.4)
Alpha-1-Globulin: 0.2 g/dL (ref 0.0–0.4)
Alpha-2-Globulin: 0.6 g/dL (ref 0.4–1.0)
Beta Globulin: 0.9 g/dL (ref 0.7–1.3)
Gamma Globulin: 1.1 g/dL (ref 0.4–1.8)
Globulin, Total: 2.9 g/dL (ref 2.2–3.9)
Total Protein ELP: 5.9 g/dL — ABNORMAL LOW (ref 6.0–8.5)

## 2020-12-30 NOTE — Progress Notes (Signed)
PCP: [name], MD Cardiology: Dr. Harl Bowie HF Cardiology: Dr. Aundra Dubin  55 y.o. with history of HTN, type 2 diabetes, CKD stage 3, and chronic diastolic CHF was referred by Dr. Harl Bowie for evaluation of diastolic CHF.  For > 1 year now, patient has had significant exertional dyspnea.  He has been admitted several times in the last year, most recently in 3/22.  Echo in 9/21 showed EF 50%, moderate LVH, grade 2 diastolic dysfunction, normal RV size and systolic function.  Creatinine elevated, he is followed by nephrology.   Patient returns for followup of CHF.  At last appointment, torsemide increased to 60 mg bid.  Today, weight is down 5 lbs, but he does not feel any better.  He is still short of breath walking around the house.  Still orthopneic.  SBP 90s today in the office, per patient it runs significantly higher at home when he checks.  No lightheadedness.  BMET today showed creatinine up to 4.4 and BUN up to 77.  He has daytime sleepiness and snoring.  Unable to get sleep study or PYP scan due to lack of insurance.   Labs (5/22): K 4.5, creatinine 2.65, myeloma panel negative Labs (6/22): K 4.7, creatinine 4.4, BUN 77  PMH; 1. HTN 2. Type 2 diabetes 3. CKD stage 3: Suspect diabetic nephropathy.   4. Chronic diastolic CHF: Echo in 3/82 with EF 50%, moderate LVH, grade 2 diastolic dysfunction, normal RV size and systolic function.   Social History   Socioeconomic History   Marital status: Married    Spouse name: Not on file   Number of children: Not on file   Years of education: Not on file   Highest education level: Not on file  Occupational History   Not on file  Tobacco Use   Smoking status: Never   Smokeless tobacco: Never  Vaping Use   Vaping Use: Never used  Substance and Sexual Activity   Alcohol use: No   Drug use: No   Sexual activity: Not on file  Other Topics Concern   Not on file  Social History Narrative   Not on file   Social Determinants of Health    Financial Resource Strain: High Risk   Difficulty of Paying Living Expenses: Hard  Food Insecurity: Food Insecurity Present   Worried About Running Out of Food in the Last Year: Sometimes true   Ran Out of Food in the Last Year: Never true  Transportation Needs: No Transportation Needs   Lack of Transportation (Medical): No   Lack of Transportation (Non-Medical): No  Physical Activity: Not on file  Stress: Not on file  Social Connections: Not on file  Intimate Partner Violence: Not on file   Family History  Problem Relation Age of Onset   Dementia Mother    Diabetes Father    ROS: All systems reviewed and negative except as per HPI.   Current Outpatient Medications  Medication Sig Dispense Refill   acetaminophen (TYLENOL) 325 MG tablet Take 2 tablets (650 mg total) by mouth every 6 (six) hours as needed for mild pain (or Fever >/= 101). 12 tablet 1   amLODipine (NORVASC) 10 MG tablet Take 1 tablet (10 mg total) by mouth daily. 90 tablet 3   Garlic Oil 5053 MG CAPS Take by mouth.     glimepiride (AMARYL) 4 MG tablet Take 2 mg by mouth 2 (two) times daily. Half tab  2 x a day     labetalol (NORMODYNE) 200 MG tablet  Take 400 mg by mouth 2 (two) times daily.     metolazone (ZAROXOLYN) 2.5 MG tablet Take 2.5 mg by mouth once a week.     Multiple Vitamin (MULTIVITAMIN WITH MINERALS) TABS tablet Take 1 tablet by mouth daily.     potassium chloride (KLOR-CON) 10 MEQ tablet Take 1 tablet (10 mEq total) by mouth daily. Take While taking Demadex/Torsemide 30 tablet 2   pravastatin (PRAVACHOL) 40 MG tablet Take 40 mg by mouth in the morning.      Zinc 50 MG CAPS Take by mouth.     hydrALAZINE (APRESOLINE) 100 MG tablet Take 0.5 tablets (50 mg total) by mouth 3 (three) times daily. 45 tablet 11   torsemide (DEMADEX) 20 MG tablet Take 4 tablets (80 mg total) by mouth 2 (two) times daily. 240 tablet 11   No current facility-administered medications for this encounter.   BP 100/62   Pulse  84   Wt 130.2 kg (287 lb)   SpO2 97%   BMI 40.03 kg/m  General: NAD Neck: JVP 10 cm, no thyromegaly or thyroid nodule.  Lungs: Clear to auscultation bilaterally with normal respiratory effort. CV: Nondisplaced PMI.  Heart regular S1/S2, no S3/S4, no murmur.  2+ edema to knees.  No carotid bruit.  Normal pedal pulses.  Abdomen: Soft, nontender, no hepatosplenomegaly, no distention.  Skin: Intact without lesions or rashes.  Neurologic: Alert and oriented x 3.  Psych: Normal affect. Extremities: No clubbing or cyanosis.  HEENT: Normal.   Assessment/Plan: 1. Chronic diastolic CHF: Most recent echo in 9/21 showed EF 50%, moderate LVH, grade 2 diastolic dysfunction, normal RV size and systolic function.  He has been admitted about 4 times in the last year.  Cardiac amyloidosis (most likely transthyretin) is a concern, myeloma panel negative.  He is still volume overloaded with NYHA class IIIb symptoms, but creatinine up to 4.4 on today's labs.   - Difficult situation with volume overload but worsening renal function.  We increased his torsemide to 80 mg bid but will hold metolazone this week, can restart next Friday but will need BMET prior.  - Cannot use Jardiance with elevated creatinine.  - Workup for cardiac amyloidosis => PYP scan (will not be able to get yet with no insurance), myeloma panel (negative), serum free light chains, urine immunofixation.  - He is a potential Cardiomems candidate if creatinine stabilizes but does not have insurance at this time.  2. CKD stage 4: Creatinine up to 4.4.  Repeat BMET next week.  - Needs to followup with nephrology.   3. Suspect OSA:  Would like sleep study but has no insurance.  4. HTN: BP low today.  Creatinine higher.  Patient denies lightheadedness.  - Decrease hydralazine to 50 mg tid.  - I will arrange for renal artery dopplers to assess for renal artery stenosis.   Social work will see him today to help with insurance   Followup in 2 wks  with APP.  With complicated presentation with volume overload and rising creatinine, may end up being admitted again soon.   Loralie Champagne 12/30/2020

## 2021-01-03 LAB — IMMUNOFIXATION, URINE

## 2021-01-18 ENCOUNTER — Other Ambulatory Visit: Payer: Self-pay

## 2021-01-18 ENCOUNTER — Telehealth (HOSPITAL_COMMUNITY): Payer: Self-pay | Admitting: *Deleted

## 2021-01-18 ENCOUNTER — Other Ambulatory Visit (HOSPITAL_COMMUNITY)
Admission: RE | Admit: 2021-01-18 | Discharge: 2021-01-18 | Disposition: A | Discharge status: Home / Self Care | Payer: Commercial Managed Care - PPO | Source: Ambulatory Visit | Attending: Family Medicine | Admitting: Family Medicine

## 2021-01-18 DIAGNOSIS — E1140 Type 2 diabetes mellitus with diabetic neuropathy, unspecified: Secondary | ICD-10-CM | POA: Insufficient documentation

## 2021-01-18 NOTE — Progress Notes (Signed)
PCP: [name], MD Cardiology: Dr. Harl Bowie HF Cardiology: Dr. Aundra Dubin  55 y.o. with history of HTN, type 2 diabetes, CKD stage 3, and chronic diastolic CHF was referred by Dr. Harl Bowie for evaluation of diastolic CHF.  For > 1 year now, patient has had significant exertional dyspnea.  He has been admitted several times in the last year, most recently in 3/22.  Echo in 9/21 showed EF 50%, moderate LVH, grade 2 diastolic dysfunction, normal RV size and systolic function.  Creatinine elevated, he is followed by nephrology.   Patient returned 6/22 for followup of CHF.  He is still short of breath walking around the house, despite increase in torsemide and weight being down.  Still orthopneic.  BMET showed creatinine up to 4.4 and BUN up to 77.  Metolazone on hold and renal artery dopplers ordered.  Today he returns for HF follow up with his sister. Overall feeling poor, having more dizziness and daily nausea. SOB with minimal activity. Napping a lot. Denies increasing CP, edema. He has had + 3 pillow orthopnea. Appetite poor. No fever or chills. Weight at home 276 pounds. Taking all medications.  Unable to get sleep study or PYP scan due to lack of insurance. Saw PCP yesterday, decreased hydralazine due to dizziness. Urinary stream sluggish, but feels volume is same. He did not follow back for repeat labs after last visit. Has appt with Nephrology tomorrow.  Labs (5/22): K 4.5, creatinine 2.65, myeloma panel negative Labs (6/22): K 4.7, creatinine 4.4, BUN 77  PMH; 1. HTN 2. Type 2 diabetes 3. CKD stage 3: Suspect diabetic nephropathy.   4. Chronic diastolic CHF: Echo in 0/93 with EF 50%, moderate LVH, grade 2 diastolic dysfunction, normal RV size and systolic function.   Social History   Socioeconomic History   Marital status: Married    Spouse name: Not on file   Number of children: Not on file   Years of education: Not on file   Highest education level: Not on file  Occupational History    Not on file  Tobacco Use   Smoking status: Never   Smokeless tobacco: Never  Vaping Use   Vaping Use: Never used  Substance and Sexual Activity   Alcohol use: No   Drug use: No   Sexual activity: Not on file  Other Topics Concern   Not on file  Social History Narrative   Not on file   Social Determinants of Health   Financial Resource Strain: High Risk   Difficulty of Paying Living Expenses: Hard  Food Insecurity: Food Insecurity Present   Worried About Running Out of Food in the Last Year: Sometimes true   Ran Out of Food in the Last Year: Never true  Transportation Needs: No Transportation Needs   Lack of Transportation (Medical): No   Lack of Transportation (Non-Medical): No  Physical Activity: Not on file  Stress: Not on file  Social Connections: Not on file  Intimate Partner Violence: Not on file   Family History  Problem Relation Age of Onset   Dementia Mother    Diabetes Father    ROS: All systems reviewed and negative except as per HPI.   Current Outpatient Medications  Medication Sig Dispense Refill   acetaminophen (TYLENOL) 325 MG tablet Take 2 tablets (650 mg total) by mouth every 6 (six) hours as needed for mild pain (or Fever >/= 101). 12 tablet 1   amLODipine (NORVASC) 10 MG tablet Take 1 tablet (10 mg total) by mouth  daily. 90 tablet 3   Garlic Oil 9147 MG CAPS Take by mouth.     glimepiride (AMARYL) 4 MG tablet Take 2 mg by mouth 2 (two) times daily. Half tab  2 x a day     hydrALAZINE (APRESOLINE) 25 MG tablet Take 25 mg by mouth 3 (three) times daily.     labetalol (NORMODYNE) 200 MG tablet Take 400 mg by mouth 2 (two) times daily.     metolazone (ZAROXOLYN) 2.5 MG tablet Take 2.5 mg by mouth once a week.     Multiple Vitamin (MULTIVITAMIN WITH MINERALS) TABS tablet Take 1 tablet by mouth daily.     potassium chloride (KLOR-CON) 10 MEQ tablet Take 1 tablet (10 mEq total) by mouth daily. Take While taking Demadex/Torsemide 30 tablet 2   pravastatin  (PRAVACHOL) 40 MG tablet Take 40 mg by mouth in the morning.      torsemide (DEMADEX) 20 MG tablet Take 4 tablets (80 mg total) by mouth 2 (two) times daily. 240 tablet 11   Zinc 50 MG CAPS Take by mouth.     No current facility-administered medications for this encounter.   BP (!) 144/76   Pulse 83   Wt 123.9 kg (273 lb 3.2 oz)   SpO2 96%   BMI 38.10 kg/m   Wt Readings from Last 3 Encounters:  01/19/21 123.9 kg (273 lb 3.2 oz)  12/29/20 130.2 kg (287 lb)  11/29/20 132.8 kg (292 lb 12.8 oz)   General:  NAD. No resp difficulty, arrived in wheelchair. HEENT: Normal Neck: Supple. Neck thick, difficult to see. Carotids 2+ bilat; no bruits. No lymphadenopathy or thryomegaly appreciated. Cor: PMI nondisplaced. Regular rate & rhythm. No rubs, gallops or murmurs. Lungs: Clear Abdomen: Obese, nontender, + distended. No hepatosplenomegaly. No bruits or masses. Good bowel sounds. Extremities: No cyanosis, clubbing, rash, 2+ LE edema. Neuro: Alert & oriented x 3, cranial nerves grossly intact. Moves all 4 extremities w/o difficulty. Affect pleasant.  Assessment/Plan: 1. Chronic diastolic CHF: Most recent echo in 9/21 showed EF 50%, moderate LVH, grade 2 diastolic dysfunction, normal RV size and systolic function.  He has been admitted about 4 times in the last year.  Cardiac amyloidosis (most likely transthyretin) is a concern, myeloma panel negative.  NYHA class IIIb symptoms, still has some volume by exam, although he says his legs look much better than previous visit. Unable to obtain REDs clip reading. His weight is down significantly 14 lbs from last visit.   Will check BMET/BNP now. - Difficult situation with volume and worsening renal function.  Continue torsemide 80 mg bid but will hold metolazone, he did not show for repeat labs from last visit so I do not know what his kidney function looks like on current dose of torsemide. We will see what creatinine is today, and make decision when/if  to take metolazone. - Cannot use Jardiance with elevated creatinine.  - Workup for cardiac amyloidosis => PYP scan (will not be able to get yet with no insurance), myeloma panel (negative), serum free light chains, urine immunofixation.  - He is a potential Cardiomems candidate if creatinine stabilizes but does not have insurance at this time.  2. CKD stage 4: Creatinine up to 4.4 last visit.  Repeat BMET now. - Will see Dr. Carolin Sicks tomorrow with Nephrology. 3. Suspect OSA:  Would like sleep study but has no insurance.  4. HTN: Elevated today.  BP log shows SBP consistently in 140s. - Increase hydralazine back to 50 mg  tid. - Continue labetalol 400 mg bid. - Renal artery dopplers ordered to assess for renal artery stenosis, have not been completed yet.   Social work helping with insurance.  Followup in 2 wks with APP.  With complicated presentation with volume overload and rising creatinine, may end up being admitted again soon.   [place] FNP 01/19/2021

## 2021-01-19 ENCOUNTER — Encounter (HOSPITAL_COMMUNITY): Payer: Self-pay

## 2021-01-19 ENCOUNTER — Ambulatory Visit (HOSPITAL_COMMUNITY)
Admission: RE | Admit: 2021-01-19 | Discharge: 2021-01-19 | Disposition: A | Discharge status: Home / Self Care | Payer: Commercial Managed Care - PPO | Source: Ambulatory Visit | Attending: Family Medicine | Admitting: Family Medicine

## 2021-01-19 ENCOUNTER — Telehealth (HOSPITAL_COMMUNITY): Payer: Self-pay | Admitting: Family Medicine

## 2021-01-19 VITALS — BP 144/76 | HR 83 | Wt 273.2 lb

## 2021-01-19 DIAGNOSIS — E1122 Type 2 diabetes mellitus with diabetic chronic kidney disease: Secondary | ICD-10-CM | POA: Insufficient documentation

## 2021-01-19 DIAGNOSIS — R42 Dizziness and giddiness: Secondary | ICD-10-CM | POA: Diagnosis present

## 2021-01-19 DIAGNOSIS — I5032 Chronic diastolic (congestive) heart failure: Secondary | ICD-10-CM | POA: Insufficient documentation

## 2021-01-19 DIAGNOSIS — I10 Essential (primary) hypertension: Secondary | ICD-10-CM | POA: Diagnosis not present

## 2021-01-19 DIAGNOSIS — R110 Nausea: Secondary | ICD-10-CM | POA: Insufficient documentation

## 2021-01-19 DIAGNOSIS — Z7984 Long term (current) use of oral hypoglycemic drugs: Secondary | ICD-10-CM | POA: Insufficient documentation

## 2021-01-19 DIAGNOSIS — Z79899 Other long term (current) drug therapy: Secondary | ICD-10-CM | POA: Insufficient documentation

## 2021-01-19 DIAGNOSIS — R0602 Shortness of breath: Secondary | ICD-10-CM | POA: Diagnosis not present

## 2021-01-19 DIAGNOSIS — Z7901 Long term (current) use of anticoagulants: Secondary | ICD-10-CM | POA: Diagnosis not present

## 2021-01-19 DIAGNOSIS — I130 Hypertensive heart and chronic kidney disease with heart failure and stage 1 through stage 4 chronic kidney disease, or unspecified chronic kidney disease: Secondary | ICD-10-CM | POA: Insufficient documentation

## 2021-01-19 DIAGNOSIS — N184 Chronic kidney disease, stage 4 (severe): Secondary | ICD-10-CM | POA: Diagnosis not present

## 2021-01-19 DIAGNOSIS — G4733 Obstructive sleep apnea (adult) (pediatric): Secondary | ICD-10-CM | POA: Diagnosis not present

## 2021-01-19 LAB — BASIC METABOLIC PANEL
Anion gap: 9 (ref 5–15)
BUN: 78 mg/dL — ABNORMAL HIGH (ref 6–20)
CO2: 24 mmol/L (ref 22–32)
Calcium: 8.7 mg/dL — ABNORMAL LOW (ref 8.9–10.3)
Chloride: 104 mmol/L (ref 98–111)
Creatinine, Ser: 4.06 mg/dL — ABNORMAL HIGH (ref 0.61–1.24)
GFR, Estimated: 17 mL/min — ABNORMAL LOW (ref >60)
Glucose, Bld: 84 mg/dL (ref 70–99)
Potassium: 4.4 mmol/L (ref 3.5–5.1)
Sodium: 137 mmol/L (ref 135–145)

## 2021-01-19 LAB — HEMOGLOBIN A1C
Hgb A1c MFr Bld: 6.7 % — ABNORMAL HIGH (ref 4.8–5.6)
Mean Plasma Glucose: 146 mg/dL

## 2021-01-19 LAB — BRAIN NATRIURETIC PEPTIDE: B Natriuretic Peptide: 117 pg/mL — ABNORMAL HIGH (ref 0.0–100.0)

## 2021-01-19 MED ORDER — HYDRALAZINE HCL 50 MG PO TABS: 50.0000 mg | ORAL_TABLET | Freq: Three times a day (TID) | ORAL | 3 refills | Status: DC

## 2021-01-19 NOTE — Patient Instructions (Signed)
INCREASE Hydralazine to 50 mg, one tab three times daily  Labs today We will only contact you if something comes back abnormal or we need to make some changes. Otherwise no news is good news!  Your physician recommends that you schedule a follow-up appointment in: 2-4 weeks  in the Advanced Practitioners (PA/NP) Clinic    Do the following things EVERYDAY: Weigh yourself in the morning before breakfast. Write it down and keep it in a log. Take your medicines as prescribed Eat low salt foods--Limit salt (sodium) to 2000 mg per day.  Stay as active as you can everyday Limit all fluids for the day to less than 2 liters  At the [facility], you and your health needs are our priority. As part of our continuing mission to provide you with exceptional heart care, we have created designated Provider Care Teams. These Care Teams include your primary Cardiologist (physician) and Advanced Practice Providers (APPs- Physician Assistants and Nurse Practitioners) who all work together to provide you with the care you need, when you need it.   You may see any of the following providers on your designated Care Team at your next follow up: Dr Glori Bickers Dr Loralie Champagne Dr Patrice Paradise, NP Lyda Jester, [place] Ginnie Smart Audry Riles, PharmD   Please be sure to bring in all your medications bottles to every appointment.

## 2021-01-19 NOTE — Telephone Encounter (Signed)
Spoke with patient regarding lab results. No metolazone, keep fluid regimen as is. Keep appt with Nephrology for tomorrow and we will see him back in a couple of weeks for follow up. He will let us know if weight goes up or he becomes more SOB. Patient verbalizes understanding.  Allena Katz, FNP-BC

## 2021-01-20 ENCOUNTER — Other Ambulatory Visit (HOSPITAL_COMMUNITY)
Admission: RE | Admit: 2021-01-20 | Discharge: 2021-01-20 | Disposition: A | Discharge status: Home / Self Care | Payer: Commercial Managed Care - PPO | Source: Ambulatory Visit | Attending: Nephrology | Admitting: Nephrology

## 2021-01-20 ENCOUNTER — Other Ambulatory Visit: Payer: Self-pay

## 2021-01-20 DIAGNOSIS — N189 Chronic kidney disease, unspecified: Secondary | ICD-10-CM | POA: Diagnosis present

## 2021-01-20 DIAGNOSIS — E1122 Type 2 diabetes mellitus with diabetic chronic kidney disease: Secondary | ICD-10-CM | POA: Insufficient documentation

## 2021-01-20 DIAGNOSIS — I129 Hypertensive chronic kidney disease with stage 1 through stage 4 chronic kidney disease, or unspecified chronic kidney disease: Secondary | ICD-10-CM | POA: Insufficient documentation

## 2021-01-20 DIAGNOSIS — R809 Proteinuria, unspecified: Secondary | ICD-10-CM | POA: Insufficient documentation

## 2021-01-20 DIAGNOSIS — R808 Other proteinuria: Secondary | ICD-10-CM | POA: Diagnosis not present

## 2021-01-20 DIAGNOSIS — I5032 Chronic diastolic (congestive) heart failure: Secondary | ICD-10-CM | POA: Diagnosis not present

## 2021-01-20 DIAGNOSIS — D638 Anemia in other chronic diseases classified elsewhere: Secondary | ICD-10-CM | POA: Insufficient documentation

## 2021-01-20 LAB — RENAL FUNCTION PANEL
Albumin: 3.5 g/dL (ref 3.5–5.0)
Anion gap: 10 (ref 5–15)
BUN: 90 mg/dL — ABNORMAL HIGH (ref 6–20)
CO2: 25 mmol/L (ref 22–32)
Calcium: 8.3 mg/dL — ABNORMAL LOW (ref 8.9–10.3)
Chloride: 101 mmol/L (ref 98–111)
Creatinine, Ser: 4.2 mg/dL — ABNORMAL HIGH (ref 0.61–1.24)
GFR, Estimated: 16 mL/min — ABNORMAL LOW (ref >60)
Glucose, Bld: 130 mg/dL — ABNORMAL HIGH (ref 70–99)
Phosphorus: 5.9 mg/dL — ABNORMAL HIGH (ref 2.5–4.6)
Potassium: 4.4 mmol/L (ref 3.5–5.1)
Sodium: 136 mmol/L (ref 135–145)

## 2021-01-20 LAB — CBC
HCT: 33.3 % — ABNORMAL LOW (ref 39.0–52.0)
Hemoglobin: 10.6 g/dL — ABNORMAL LOW (ref 13.0–17.0)
MCH: 26.6 pg (ref 26.0–34.0)
MCHC: 31.8 g/dL (ref 30.0–36.0)
MCV: 83.5 fL (ref 80.0–100.0)
Platelets: 299 10*3/uL (ref 150–400)
RBC: 3.99 MIL/uL — ABNORMAL LOW (ref 4.22–5.81)
RDW: 13.8 % (ref 11.5–15.5)
WBC: 5.2 10*3/uL (ref 4.0–10.5)
nRBC: 0 % (ref 0.0–0.2)

## 2021-01-20 LAB — PROTEIN / CREATININE RATIO, URINE
Creatinine, Urine: 50.37 mg/dL
Protein Creatinine Ratio: 1.35 mg/mg{Cre} — ABNORMAL HIGH (ref 0.00–0.15)
Total Protein, Urine: 68 mg/dL

## 2021-01-27 ENCOUNTER — Encounter (HOSPITAL_COMMUNITY): Admission: RE | Admit: 2021-01-27 | Payer: Commercial Managed Care - PPO | Source: Ambulatory Visit

## 2021-01-27 ENCOUNTER — Encounter (HOSPITAL_COMMUNITY): Payer: Commercial Managed Care - PPO | Attending: Cardiology

## 2021-02-09 ENCOUNTER — Encounter: Payer: Self-pay | Admitting: *Deleted

## 2021-02-11 ENCOUNTER — Encounter: Payer: Self-pay | Admitting: Cardiology

## 2021-02-11 ENCOUNTER — Ambulatory Visit (INDEPENDENT_AMBULATORY_CARE_PROVIDER_SITE_OTHER): Payer: Commercial Managed Care - PPO | Admitting: Cardiology

## 2021-02-11 ENCOUNTER — Other Ambulatory Visit: Payer: Self-pay

## 2021-02-11 VITALS — BP 186/98 | HR 88 | Ht 71.0 in | Wt 276.0 lb

## 2021-02-11 DIAGNOSIS — I10 Essential (primary) hypertension: Secondary | ICD-10-CM | POA: Diagnosis not present

## 2021-02-11 DIAGNOSIS — I5033 Acute on chronic diastolic (congestive) heart failure: Secondary | ICD-10-CM | POA: Diagnosis not present

## 2021-02-11 MED ORDER — HYDRALAZINE HCL 50 MG PO TABS: 75.0000 mg | ORAL_TABLET | Freq: Three times a day (TID) | ORAL | 3 refills | Status: DC

## 2021-02-11 NOTE — Progress Notes (Signed)
Clinical Summary [name] is a 55 y.o.male seen today for follow up of the following medical problems.   1.Chronic diastolic HF - 03/5283 Echocardiogram shows a low-normal EF of 50% with no regional WMA. Noted to have severe LVH and Grade 2 DD.  -followed in HF clinic - myeloma panel negative, has not been able to get pyp scan due to insurance or a sleep study - he is on torsemide 80mg  bid, metolazone 2.5mg  once weekly though recently metolazone has been on hold - last Cr 4.2, uptrend from 2.7 2 months ago. - notes mention weight had been up to 290 lbs, down to 276 today. Home weight down to 272 lbs.  - some ongoing swelling in legs, though much imprved. SOB improving. No orthopnea.    2. Resistant HTN -awaiting renal artery [place] - due to insurance has not been able to get sleep study.  - has not taken meds yet - home bp's usually 150s-160s - some prior issues with dizziness    3. CKD - followed by nephrology Dr [name] - from last note planning for vacsular consult to consider access    Past Medical History:  Diagnosis Date   CHF (congestive heart failure) ([place])    Diabetes mellitus    Hypertension      No Known Allergies   Current Outpatient Medications  Medication Sig Dispense Refill   acetaminophen (TYLENOL) 325 MG tablet Take 2 tablets (650 mg total) by mouth every 6 (six) hours as needed for mild pain (or Fever >/= 101). 12 tablet 1   amLODipine (NORVASC) 10 MG tablet Take 1 tablet (10 mg total) by mouth daily. 90 tablet 3   Garlic Oil 1324 MG CAPS Take by mouth.     glimepiride (AMARYL) 4 MG tablet Take 2 mg by mouth 2 (two) times daily. Half tab  2 x a day     hydrALAZINE (APRESOLINE) 50 MG tablet Take 1 tablet (50 mg total) by mouth 3 (three) times daily. 90 tablet 3   labetalol (NORMODYNE) 200 MG tablet Take 400 mg by mouth 2 (two) times daily.     metolazone (ZAROXOLYN) 2.5 MG tablet Take 2.5 mg by mouth once a week.     Multiple Vitamin (MULTIVITAMIN  WITH MINERALS) TABS tablet Take 1 tablet by mouth daily.     potassium chloride (KLOR-CON) 10 MEQ tablet Take 1 tablet (10 mEq total) by mouth daily. Take While taking Demadex/Torsemide 30 tablet 2   pravastatin (PRAVACHOL) 40 MG tablet Take 40 mg by mouth in the morning.      torsemide (DEMADEX) 20 MG tablet Take 4 tablets (80 mg total) by mouth 2 (two) times daily. 240 tablet 11   Zinc 50 MG CAPS Take by mouth.     No current facility-administered medications for this visit.     Past Surgical History:  Procedure Laterality Date   INCISION AND DRAINAGE     KNEE SURGERY       No Known Allergies    Family History  Problem Relation Age of Onset   Dementia Mother    Diabetes Father      Social History [name] reports that he has never smoked. He has never used smokeless tobacco. [name] reports no history of alcohol use.   Review of Systems CONSTITUTIONAL: No weight loss, fever, chills, weakness or fatigue.  HEENT: Eyes: No visual loss, blurred vision, double vision or yellow sclerae.No hearing loss, sneezing, congestion, runny nose or sore  throat.  SKIN: No rash or itching.  CARDIOVASCULAR: per hpi RESPIRATORY: per hpi GASTROINTESTINAL: No anorexia, nausea, vomiting or diarrhea. No abdominal pain or blood.  GENITOURINARY: No burning on urination, no polyuria NEUROLOGICAL: No headache, dizziness, syncope, paralysis, ataxia, numbness or tingling in the extremities. No change in bowel or bladder control.  MUSCULOSKELETAL: No muscle, back pain, joint pain or stiffness.  LYMPHATICS: No enlarged nodes. No history of splenectomy.  PSYCHIATRIC: No history of depression or anxiety.  ENDOCRINOLOGIC: No reports of sweating, cold or heat intolerance. No polyuria or polydipsia.  Marland Kitchen   Physical Examination Today's Vitals   02/11/21 0952  BP: (!) 186/98  Pulse: 88  SpO2: 97%  Weight: 276 lb (125.2 kg)  Height: 5\' 11"  (1.803 m)   Body mass index is 38.49 kg/m.  Gen:  resting comfortably, no acute distress HEENT: no scleral icterus, pupils equal round and reactive, no palptable cervical adenopathy,  CV: RRR, no m/rg, no jvd Resp: Clear to auscultation bilaterally GI: abdomen is soft, non-tender, non-distended, normal bowel sounds, no hepatosplenomegaly MSK: extremities are warm, 1-2+ bilateral LE edema Skin: warm, no rash Neuro:  no focal deficits Psych: appropriate affect   Diagnostic Studies     Assessment and Plan   Acute on chronic diastolic HF - weight down 15 lbs since 09/2020, symptoms improving but still not euvolemic - diuretics affected by significant renal dysfunction. He is followed by both CHF clinic and neprhology - defer diuretic dosing to HF/renal, on torsemide 80mg  bid currently and has been holding his metolazone  2. HTN - above goal, increase hydralazine to 75mg  tid - awaiting renal artery [place]. From notes insurance issues with getting a sleep study. Could consider a renin/aldo level next round of labs      Keep close follow ups with CHF clinic and nephrology, we can see him every 6 months    [name], M.D.

## 2021-02-11 NOTE — Patient Instructions (Signed)
Medication Instructions:  Your physician has recommended you make the following change in your medication:   Increase Hydralazine to 75 mg 3 Times Daily   *If you need a refill on your cardiac medications before your next appointment, please call your pharmacy*   Lab Work: NONE   If you have labs (blood work) drawn today and your tests are completely normal, you will receive your results only by: [place] (if you have MyChart) OR A paper copy in the mail If you have any lab test that is abnormal or we need to change your treatment, we will call you to review the results.   Testing/Procedures: NONE    Follow-Up: At [facility], you and your health needs are our priority.  As part of our continuing mission to provide you with exceptional heart care, we have created designated Provider Care Teams.  These Care Teams include your primary Cardiologist (physician) and Advanced Practice Providers (APPs -  Physician Assistants and Nurse Practitioners) who all work together to provide you with the care you need, when you need it.  We recommend signing up for the patient portal called "MyChart".  Sign up information is provided on this After Visit Summary.  MyChart is used to connect with patients for Virtual Visits (Telemedicine).  Patients are able to view lab/test results, encounter notes, upcoming appointments, etc.  Non-urgent messages can be sent to your provider as well.   To learn more about what you can do with MyChart, go to [email or web address].    Your next appointment:   6 month(s)  The format for your next appointment:   In Person  Provider:   Carlyle Dolly, MD   Other Instructions Thank you for choosing [facility]!

## 2021-02-17 NOTE — Progress Notes (Signed)
PCP: [name], MD Cardiology: Dr. Harl Bowie HF Cardiology: Dr. Aundra Dubin  55 y.o. with history of HTN, type 2 diabetes, CKD stage 3, and chronic diastolic CHF was referred by Dr. Harl Bowie for evaluation of diastolic CHF.  For > 1 year now, patient has had significant exertional dyspnea.  He has been admitted several times in the last year, most recently in 3/22.  Echo in 9/21 showed EF 50%, moderate LVH, grade 2 diastolic dysfunction, normal RV size and systolic function.  Creatinine elevated, he is followed by nephrology.   Patient returned 6/22 for followup of CHF.  He is still short of breath walking around the house, despite increase in torsemide and weight being down.  Still orthopneic.  BMET showed creatinine up to 4.4 and BUN up to 77.  Metolazone on hold and renal artery dopplers ordered.  Today he returns for HF follow up. He haf worsening renal function last visit and we held off on metolazone. He saw Dr. Theador Hawthorne with nephrology 6/30 and discussed impending need for HD. He was agreeable and was referred to vascular surgery for consult. No changes were made to his diuretic regimen. He has nephrotic range proteinuria, biopsy was discussed with patient but he is reluctant to undergo biopsy for now. Overall feeling ok, SOB with minimal activity however he says his breathing is better than previous visits. He says his leg swelling is better as well. Denies CP, dizziness, or PND/Orthopnea. Appetite ok. No fever or chills. Weight at home 283 pounds. He missed 2 days of his meds this week due to nausea. Unable to get sleep study or PYP scan due to lack of insurance. He is asking about kidney transplant.  ReDs: 43% ECG (Personally reviewed): SR 85 bpm, unchanged from prior.  Labs (5/22): K 4.5, creatinine 2.65, myeloma panel negative Labs (6/22): K 4.7, creatinine 4.4, BUN 77  PMH; 1. HTN 2. Type 2 diabetes 3. CKD stage 3: Suspect diabetic nephropathy.   4. Chronic diastolic CHF: Echo in 7/65  with EF 50%, moderate LVH, grade 2 diastolic dysfunction, normal RV size and systolic function.   Social History   Socioeconomic History   Marital status: Married    Spouse name: Not on file   Number of children: Not on file   Years of education: Not on file   Highest education level: Not on file  Occupational History   Not on file  Tobacco Use   Smoking status: Never   Smokeless tobacco: Never  Vaping Use   Vaping Use: Never used  Substance and Sexual Activity   Alcohol use: No   Drug use: No   Sexual activity: Not on file  Other Topics Concern   Not on file  Social History Narrative   Not on file   Social Determinants of Health   Financial Resource Strain: High Risk   Difficulty of Paying Living Expenses: Hard  Food Insecurity: Food Insecurity Present   Worried About Running Out of Food in the Last Year: Sometimes true   Ran Out of Food in the Last Year: Never true  Transportation Needs: No Transportation Needs   Lack of Transportation (Medical): No   Lack of Transportation (Non-Medical): No  Physical Activity: Not on file  Stress: Not on file  Social Connections: Not on file  Intimate Partner Violence: Not on file   Family History  Problem Relation Age of Onset   Dementia Mother    Diabetes Father    ROS: All systems reviewed and negative except  as per HPI.   Current Outpatient Medications  Medication Sig Dispense Refill   acetaminophen (TYLENOL) 325 MG tablet Take 2 tablets (650 mg total) by mouth every 6 (six) hours as needed for mild pain (or Fever >/= 101). 12 tablet 1   amLODipine (NORVASC) 10 MG tablet Take 1 tablet (10 mg total) by mouth daily. 90 tablet 3   Garlic Oil 0865 MG CAPS Take by mouth.     glimepiride (AMARYL) 4 MG tablet Take 2 mg by mouth 2 (two) times daily. Half tab  2 x a day     hydrALAZINE (APRESOLINE) 50 MG tablet Take 1.5 tablets (75 mg total) by mouth 3 (three) times daily. 411 tablet 3   labetalol (NORMODYNE) 200 MG tablet Take  400 mg by mouth 2 (two) times daily.     metolazone (ZAROXOLYN) 2.5 MG tablet Take 2.5 mg by mouth once a week.     Multiple Vitamin (MULTIVITAMIN WITH MINERALS) TABS tablet Take 1 tablet by mouth daily.     potassium chloride (KLOR-CON) 10 MEQ tablet Take 1 tablet (10 mEq total) by mouth daily. Take While taking Demadex/Torsemide 30 tablet 2   pravastatin (PRAVACHOL) 40 MG tablet Take 40 mg by mouth in the morning.      torsemide (DEMADEX) 20 MG tablet Take 4 tablets (80 mg total) by mouth 2 (two) times daily. 240 tablet 11   Zinc 50 MG CAPS Take by mouth.     No current facility-administered medications for this encounter.   BP 128/69   Pulse 84   Wt 128.1 kg (282 lb 6.4 oz)   SpO2 97%   BMI 39.39 kg/m   Wt Readings from Last 3 Encounters:  02/18/21 128.1 kg (282 lb 6.4 oz)  02/11/21 125.2 kg (276 lb)  01/19/21 123.9 kg (273 lb 3.2 oz)   General:  NAD. No resp difficulty, arrived in [facility] HEENT: Normal, mild periorbital edema Neck: Supple. No JVD. Carotids 2+ bilat; no bruits. No lymphadenopathy or thryomegaly appreciated. Cor: PMI nondisplaced. Regular rate & rhythm. No rubs, gallops or murmurs. Lungs: Clear Abdomen: Obese, nontender, nondistended. No hepatosplenomegaly. No bruits or masses. Good bowel sounds. Extremities: No cyanosis, clubbing, rash, 1-2 + BLE edema Neuro: Alert & oriented x 3, cranial nerves grossly intact. Moves all 4 extremities w/o difficulty. Affect pleasant.  Assessment/Plan: 1. Chronic diastolic CHF: Most recent echo in 9/21 showed EF 50%, moderate LVH, grade 2 diastolic dysfunction, normal RV size and systolic function.  He has been admitted about 4 times in the last year.  Cardiac amyloidosis (most likely transthyretin) is a concern, myeloma panel negative.  NYHA class IIIb symptoms, still has some volume by exam, although he says his legs look much better than previous visit and breathing has improved. His weight is up about 9 lbs from last visit, but he  was down 14 lbs then.  He appears mildly volume overloaded today. - He will take his scheduled metolazone today. - Increase torsemide to 100 mg bid x 2 days then back to 80 mg bid. BMET today, repeat in 10 days. - Cannot use Jardiance with elevated creatinine.  - Workup for cardiac amyloidosis => PYP scan (will not be able to get yet with no insurance), myeloma panel (negative), serum free light chains, urine immunofixation.  - He is a potential Cardiomems candidate if creatinine stabilizes but does not have insurance at this time.  2. CKD stage 4: Creatinine up to 4.4. - He is followed by Dr. Carolin Sicks  and referred to vascular with plans to initiate HD access. 3. Suspect OSA:  Would like sleep study but has no insurance.  4. HTN: Stable today.  He forgot his BP log today. - Continue hydralazine 75 mg tid. - Continue labetalol 400 mg bid. - Continue amlodipine 10 mg daily. - Renal artery dopplers ordered to assess for renal artery stenosis, have not been completed yet.  - ? Checking renin/aldo level.   Social work helping with insurance.  Follow up with Dr. Aundra Dubin in 6 weeks.  With complicated presentation with volume overload and rising creatinine, may end up being admitted again soon.   [place] FNP 02/18/2021

## 2021-02-18 ENCOUNTER — Other Ambulatory Visit: Payer: Self-pay

## 2021-02-18 ENCOUNTER — Encounter (HOSPITAL_COMMUNITY): Payer: Self-pay

## 2021-02-18 ENCOUNTER — Ambulatory Visit (HOSPITAL_COMMUNITY)
Admission: RE | Admit: 2021-02-18 | Discharge: 2021-02-18 | Disposition: A | Discharge status: Home / Self Care | Payer: Commercial Managed Care - PPO | Source: Ambulatory Visit | Attending: Family Medicine | Admitting: Family Medicine

## 2021-02-18 ENCOUNTER — Telehealth (HOSPITAL_COMMUNITY): Payer: Self-pay | Admitting: Family Medicine

## 2021-02-18 VITALS — BP 128/69 | HR 84 | Wt 282.4 lb

## 2021-02-18 DIAGNOSIS — Z79899 Other long term (current) drug therapy: Secondary | ICD-10-CM | POA: Diagnosis not present

## 2021-02-18 DIAGNOSIS — Z7984 Long term (current) use of oral hypoglycemic drugs: Secondary | ICD-10-CM | POA: Insufficient documentation

## 2021-02-18 DIAGNOSIS — E1122 Type 2 diabetes mellitus with diabetic chronic kidney disease: Secondary | ICD-10-CM | POA: Diagnosis not present

## 2021-02-18 DIAGNOSIS — I130 Hypertensive heart and chronic kidney disease with heart failure and stage 1 through stage 4 chronic kidney disease, or unspecified chronic kidney disease: Secondary | ICD-10-CM | POA: Insufficient documentation

## 2021-02-18 DIAGNOSIS — N184 Chronic kidney disease, stage 4 (severe): Secondary | ICD-10-CM

## 2021-02-18 DIAGNOSIS — G4733 Obstructive sleep apnea (adult) (pediatric): Secondary | ICD-10-CM

## 2021-02-18 DIAGNOSIS — I5032 Chronic diastolic (congestive) heart failure: Secondary | ICD-10-CM | POA: Diagnosis present

## 2021-02-18 DIAGNOSIS — I10 Essential (primary) hypertension: Secondary | ICD-10-CM

## 2021-02-18 LAB — BASIC METABOLIC PANEL
Anion gap: 5 (ref 5–15)
BUN: 57 mg/dL — ABNORMAL HIGH (ref 6–20)
CO2: 23 mmol/L (ref 22–32)
Calcium: 8.5 mg/dL — ABNORMAL LOW (ref 8.9–10.3)
Chloride: 111 mmol/L (ref 98–111)
Creatinine, Ser: 3.07 mg/dL — ABNORMAL HIGH (ref 0.61–1.24)
GFR, Estimated: 23 mL/min — ABNORMAL LOW (ref >60)
Glucose, Bld: 102 mg/dL — ABNORMAL HIGH (ref 70–99)
Potassium: 5.4 mmol/L — ABNORMAL HIGH (ref 3.5–5.1)
Sodium: 139 mmol/L (ref 135–145)

## 2021-02-18 NOTE — Telephone Encounter (Signed)
Spoke with patient regarding lab results today. He will stop his KCl supp and has repeat lab scheduled for next week. He verbalized understanding, chart updated to reflect medication changes.  Allena Katz, FNP-BC

## 2021-02-18 NOTE — Progress Notes (Signed)
ReDS Vest / Clip - 02/18/21 0900       ReDS Vest / Clip   Station Marker D    Ruler Value 37    ReDS Value Range High volume overload    ReDS Actual Value 43

## 2021-02-18 NOTE — Addendum Note (Signed)
Encounter addended by: Rafael Bihari, FNP on: 02/18/2021 1:43 PM  Actions taken: Medication long-term status modified, Order list changed

## 2021-02-18 NOTE — Patient Instructions (Signed)
INCREASE Torsemide to 100 mg twice a day for 2 days, and then resume normal dose of 80 mg twice a day  Be sure to take Metoalzone today  Labs today We will only contact you if something comes back abnormal or we need to make some changes. Otherwise no news is good news!  Labs needed in 7-10 days  Your physician recommends that you schedule a follow-up appointment in: 6-8 weeks with Dr Aundra Dubin  Do the following things EVERYDAY: Weigh yourself in the morning before breakfast. Write it down and keep it in a log. Take your medicines as prescribed Eat low salt foods--Limit salt (sodium) to 2000 mg per day.  Stay as active as you can everyday Limit all fluids for the day to less than 2 liters  milAt the [facility], you and your health needs are our priority. As part of our continuing mission to provide you with exceptional heart care, we have created designated Provider Care Teams. These Care Teams include your primary Cardiologist (physician) and Advanced Practice Providers (APPs- Physician Assistants and Nurse Practitioners) who all work together to provide you with the care you need, when you need it.   You may see any of the following providers on your designated Care Team at your next follow up: Dr Glori Bickers Dr Loralie Champagne Dr Patrice Paradise, NP Lyda Jester, [place] Ginnie Smart Audry Riles, PharmD   Please be sure to bring in all your medications bottles to every appointment.

## 2021-02-28 ENCOUNTER — Ambulatory Visit (HOSPITAL_COMMUNITY)
Admission: RE | Admit: 2021-02-28 | Discharge: 2021-02-28 | Disposition: A | Discharge status: Home / Self Care | Payer: Commercial Managed Care - PPO | Source: Ambulatory Visit | Attending: Internal Medicine | Admitting: Internal Medicine

## 2021-02-28 ENCOUNTER — Other Ambulatory Visit: Payer: Self-pay

## 2021-02-28 DIAGNOSIS — I5032 Chronic diastolic (congestive) heart failure: Secondary | ICD-10-CM

## 2021-02-28 LAB — BASIC METABOLIC PANEL
Anion gap: 7 (ref 5–15)
BUN: 56 mg/dL — ABNORMAL HIGH (ref 6–20)
CO2: 23 mmol/L (ref 22–32)
Calcium: 8.5 mg/dL — ABNORMAL LOW (ref 8.9–10.3)
Chloride: 109 mmol/L (ref 98–111)
Creatinine, Ser: 3.31 mg/dL — ABNORMAL HIGH (ref 0.61–1.24)
GFR, Estimated: 21 mL/min — ABNORMAL LOW (ref >60)
Glucose, Bld: 137 mg/dL — ABNORMAL HIGH (ref 70–99)
Potassium: 5 mmol/L (ref 3.5–5.1)
Sodium: 139 mmol/L (ref 135–145)

## 2021-03-01 ENCOUNTER — Telehealth (HOSPITAL_COMMUNITY): Payer: Self-pay | Admitting: Cardiology

## 2021-03-01 NOTE — Telephone Encounter (Signed)
Opened in error

## 2021-03-16 ENCOUNTER — Encounter (HOSPITAL_COMMUNITY): Payer: Self-pay

## 2021-03-31 NOTE — Addendum Note (Signed)
Addended by: Imogene Burn on: 03/31/2021 08:05 AM   Modules accepted: Orders

## 2021-04-16 ENCOUNTER — Other Ambulatory Visit: Payer: Self-pay

## 2021-04-16 ENCOUNTER — Inpatient Hospital Stay (HOSPITAL_COMMUNITY)
Admission: EM | Admit: 2021-04-16 | Discharge: 2021-04-23 | DRG: 291 | Disposition: A | Discharge status: Home / Self Care | Payer: Commercial Managed Care - PPO | Attending: Internal Medicine | Admitting: Internal Medicine

## 2021-04-16 ENCOUNTER — Encounter (HOSPITAL_COMMUNITY): Payer: Self-pay | Admitting: Internal Medicine

## 2021-04-16 ENCOUNTER — Emergency Department (HOSPITAL_COMMUNITY): Payer: Commercial Managed Care - PPO

## 2021-04-16 DIAGNOSIS — J90 Pleural effusion, not elsewhere classified: Secondary | ICD-10-CM

## 2021-04-16 DIAGNOSIS — Z20822 Contact with and (suspected) exposure to covid-19: Secondary | ICD-10-CM | POA: Diagnosis present

## 2021-04-16 DIAGNOSIS — Z79899 Other long term (current) drug therapy: Secondary | ICD-10-CM

## 2021-04-16 DIAGNOSIS — I160 Hypertensive urgency: Secondary | ICD-10-CM | POA: Diagnosis present

## 2021-04-16 DIAGNOSIS — N2581 Secondary hyperparathyroidism of renal origin: Secondary | ICD-10-CM | POA: Diagnosis present

## 2021-04-16 DIAGNOSIS — I130 Hypertensive heart and chronic kidney disease with heart failure and stage 1 through stage 4 chronic kidney disease, or unspecified chronic kidney disease: Principal | ICD-10-CM | POA: Diagnosis present

## 2021-04-16 DIAGNOSIS — Z794 Long term (current) use of insulin: Secondary | ICD-10-CM | POA: Diagnosis not present

## 2021-04-16 DIAGNOSIS — Z833 Family history of diabetes mellitus: Secondary | ICD-10-CM

## 2021-04-16 DIAGNOSIS — E6609 Other obesity due to excess calories: Secondary | ICD-10-CM

## 2021-04-16 DIAGNOSIS — Z7984 Long term (current) use of oral hypoglycemic drugs: Secondary | ICD-10-CM | POA: Diagnosis not present

## 2021-04-16 DIAGNOSIS — T502X5A Adverse effect of carbonic-anhydrase inhibitors, benzothiadiazides and other diuretics, initial encounter: Secondary | ICD-10-CM | POA: Diagnosis present

## 2021-04-16 DIAGNOSIS — E875 Hyperkalemia: Secondary | ICD-10-CM | POA: Diagnosis present

## 2021-04-16 DIAGNOSIS — E785 Hyperlipidemia, unspecified: Secondary | ICD-10-CM | POA: Diagnosis present

## 2021-04-16 DIAGNOSIS — E1122 Type 2 diabetes mellitus with diabetic chronic kidney disease: Secondary | ICD-10-CM | POA: Diagnosis present

## 2021-04-16 DIAGNOSIS — J9601 Acute respiratory failure with hypoxia: Secondary | ICD-10-CM | POA: Diagnosis present

## 2021-04-16 DIAGNOSIS — D631 Anemia in chronic kidney disease: Secondary | ICD-10-CM | POA: Diagnosis present

## 2021-04-16 DIAGNOSIS — Z6838 Body mass index (BMI) 38.0-38.9, adult: Secondary | ICD-10-CM | POA: Diagnosis not present

## 2021-04-16 DIAGNOSIS — N179 Acute kidney failure, unspecified: Secondary | ICD-10-CM | POA: Diagnosis present

## 2021-04-16 DIAGNOSIS — I5033 Acute on chronic diastolic (congestive) heart failure: Secondary | ICD-10-CM | POA: Diagnosis present

## 2021-04-16 DIAGNOSIS — I509 Heart failure, unspecified: Secondary | ICD-10-CM

## 2021-04-16 DIAGNOSIS — N184 Chronic kidney disease, stage 4 (severe): Secondary | ICD-10-CM | POA: Diagnosis present

## 2021-04-16 DIAGNOSIS — E1121 Type 2 diabetes mellitus with diabetic nephropathy: Secondary | ICD-10-CM | POA: Diagnosis not present

## 2021-04-16 DIAGNOSIS — I10 Essential (primary) hypertension: Secondary | ICD-10-CM | POA: Diagnosis present

## 2021-04-16 DIAGNOSIS — Z6841 Body Mass Index (BMI) 40.0 and over, adult: Secondary | ICD-10-CM | POA: Diagnosis not present

## 2021-04-16 LAB — GLUCOSE, CAPILLARY: Glucose-Capillary: 161 mg/dL — ABNORMAL HIGH (ref 70–99)

## 2021-04-16 LAB — BASIC METABOLIC PANEL
Anion gap: 7 (ref 5–15)
BUN: 62 mg/dL — ABNORMAL HIGH (ref 6–20)
CO2: 22 mmol/L (ref 22–32)
Calcium: 8.1 mg/dL — ABNORMAL LOW (ref 8.9–10.3)
Chloride: 111 mmol/L (ref 98–111)
Creatinine, Ser: 3.36 mg/dL — ABNORMAL HIGH (ref 0.61–1.24)
GFR, Estimated: 21 mL/min — ABNORMAL LOW (ref >60)
Glucose, Bld: 123 mg/dL — ABNORMAL HIGH (ref 70–99)
Potassium: 5.5 mmol/L — ABNORMAL HIGH (ref 3.5–5.1)
Sodium: 140 mmol/L (ref 135–145)

## 2021-04-16 LAB — CBC WITH DIFFERENTIAL/PLATELET
Abs Immature Granulocytes: 0.01 10*3/uL (ref 0.00–0.07)
Basophils Absolute: 0 10*3/uL (ref 0.0–0.1)
Basophils Relative: 1 %
Eosinophils Absolute: 0.2 10*3/uL (ref 0.0–0.5)
Eosinophils Relative: 3 %
HCT: 31.4 % — ABNORMAL LOW (ref 39.0–52.0)
Hemoglobin: 9.9 g/dL — ABNORMAL LOW (ref 13.0–17.0)
Immature Granulocytes: 0 %
Lymphocytes Relative: 16 %
Lymphs Abs: 1 10*3/uL (ref 0.7–4.0)
MCH: 28 pg (ref 26.0–34.0)
MCHC: 31.5 g/dL (ref 30.0–36.0)
MCV: 89 fL (ref 80.0–100.0)
Monocytes Absolute: 0.6 10*3/uL (ref 0.1–1.0)
Monocytes Relative: 9 %
Neutro Abs: 4.4 10*3/uL (ref 1.7–7.7)
Neutrophils Relative %: 71 %
Platelets: 344 10*3/uL (ref 150–400)
RBC: 3.53 MIL/uL — ABNORMAL LOW (ref 4.22–5.81)
RDW: 15.4 % (ref 11.5–15.5)
WBC: 6.2 10*3/uL (ref 4.0–10.5)
nRBC: 0 % (ref 0.0–0.2)

## 2021-04-16 LAB — RESP PANEL BY RT-PCR (FLU A&B, COVID) ARPGX2
Influenza A by PCR: NEGATIVE
Influenza B by PCR: NEGATIVE
SARS Coronavirus 2 by RT PCR: NEGATIVE

## 2021-04-16 LAB — TROPONIN I (HIGH SENSITIVITY)
Troponin I (High Sensitivity): 22 ng/L — ABNORMAL HIGH (ref <18)
Troponin I (High Sensitivity): 23 ng/L — ABNORMAL HIGH (ref <18)

## 2021-04-16 LAB — HEMOGLOBIN A1C
Hgb A1c MFr Bld: 6.6 % — ABNORMAL HIGH (ref 4.8–5.6)
Mean Plasma Glucose: 142.72 mg/dL

## 2021-04-16 LAB — BRAIN NATRIURETIC PEPTIDE: B Natriuretic Peptide: 461 pg/mL — ABNORMAL HIGH (ref 0.0–100.0)

## 2021-04-16 MED ORDER — LABETALOL HCL 5 MG/ML IV SOLN
10.0000 mg | Freq: Once | INTRAVENOUS | Status: AC
  Administered 2021-04-16: 10 mg via INTRAVENOUS
  Filled 2021-04-16: qty 4

## 2021-04-16 MED ORDER — ACETAMINOPHEN 325 MG PO TABS: 650.0000 mg | ORAL_TABLET | ORAL | Status: DC | PRN

## 2021-04-16 MED ORDER — FUROSEMIDE 10 MG/ML IJ SOLN
80.0000 mg | Freq: Two times a day (BID) | INTRAMUSCULAR | Status: DC
  Administered 2021-04-16 – 2021-04-18 (×4): 80 mg via INTRAVENOUS
  Filled 2021-04-16 (×4): qty 8

## 2021-04-16 MED ORDER — LABETALOL HCL 200 MG PO TABS
400.0000 mg | ORAL_TABLET | Freq: Two times a day (BID) | ORAL | Status: DC
  Administered 2021-04-16 – 2021-04-23 (×14): 400 mg via ORAL
  Filled 2021-04-16 (×14): qty 2

## 2021-04-16 MED ORDER — PRAVASTATIN SODIUM 40 MG PO TABS
40.0000 mg | ORAL_TABLET | Freq: Every morning | ORAL | Status: DC
  Administered 2021-04-17 – 2021-04-23 (×7): 40 mg via ORAL
  Filled 2021-04-16 (×7): qty 1

## 2021-04-16 MED ORDER — AMLODIPINE BESYLATE 5 MG PO TABS
10.0000 mg | ORAL_TABLET | Freq: Every day | ORAL | Status: DC
  Administered 2021-04-17 – 2021-04-23 (×7): 10 mg via ORAL
  Filled 2021-04-16 (×7): qty 2

## 2021-04-16 MED ORDER — INSULIN ASPART 100 UNIT/ML IJ SOLN
0.0000 [IU] | Freq: Three times a day (TID) | INTRAMUSCULAR | Status: DC
  Administered 2021-04-17 – 2021-04-19 (×4): 2 [IU] via SUBCUTANEOUS
  Administered 2021-04-19 – 2021-04-21 (×3): 1 [IU] via SUBCUTANEOUS
  Administered 2021-04-21: 2 [IU] via SUBCUTANEOUS
  Administered 2021-04-22 – 2021-04-23 (×3): 1 [IU] via SUBCUTANEOUS

## 2021-04-16 MED ORDER — SODIUM CHLORIDE 0.9 % IV SOLN: 250.0000 mL | INTRAVENOUS | Status: DC | PRN

## 2021-04-16 MED ORDER — FUROSEMIDE 10 MG/ML IJ SOLN
40.0000 mg | Freq: Once | INTRAMUSCULAR | Status: AC
  Administered 2021-04-16: 40 mg via INTRAVENOUS
  Filled 2021-04-16: qty 4

## 2021-04-16 MED ORDER — AMLODIPINE BESYLATE 5 MG PO TABS
10.0000 mg | ORAL_TABLET | Freq: Once | ORAL | Status: AC
  Administered 2021-04-16: 10 mg via ORAL
  Filled 2021-04-16: qty 2

## 2021-04-16 MED ORDER — HYDRALAZINE HCL 25 MG PO TABS
75.0000 mg | ORAL_TABLET | Freq: Three times a day (TID) | ORAL | Status: DC
  Administered 2021-04-16 – 2021-04-23 (×21): 75 mg via ORAL
  Filled 2021-04-16 (×21): qty 3

## 2021-04-16 MED ORDER — HYDRALAZINE HCL 25 MG PO TABS
75.0000 mg | ORAL_TABLET | Freq: Once | ORAL | Status: AC
  Administered 2021-04-16: 75 mg via ORAL
  Filled 2021-04-16: qty 3

## 2021-04-16 MED ORDER — HEPARIN SODIUM (PORCINE) 5000 UNIT/ML IJ SOLN
5000.0000 [IU] | Freq: Three times a day (TID) | INTRAMUSCULAR | Status: DC
  Administered 2021-04-16 – 2021-04-23 (×20): 5000 [IU] via SUBCUTANEOUS
  Filled 2021-04-16 (×20): qty 1

## 2021-04-16 MED ORDER — SODIUM CHLORIDE 0.9% FLUSH: 3.0000 mL | INTRAVENOUS | Status: DC | PRN

## 2021-04-16 MED ORDER — ONDANSETRON HCL 4 MG/2ML IJ SOLN
4.0000 mg | Freq: Four times a day (QID) | INTRAMUSCULAR | Status: DC | PRN
  Administered 2021-04-20: 4 mg via INTRAVENOUS
  Filled 2021-04-16: qty 2

## 2021-04-16 MED ORDER — SODIUM CHLORIDE 0.9% FLUSH
3.0000 mL | Freq: Two times a day (BID) | INTRAVENOUS | Status: DC
  Administered 2021-04-16 – 2021-04-23 (×11): 3 mL via INTRAVENOUS

## 2021-04-16 NOTE — ED Provider Notes (Signed)
[facility] EMERGENCY DEPARTMENT Provider Note   CSN: [id number] Arrival date & time: 04/16/21  2778     History Chief Complaint  Patient presents with   Shortness of [name] is a 55 y.o. male.  Pt presents to the ED today with sob.  Pt said he has been having sob for the past year.  He said it has been worse for the last few days.  He is very sob with exertion and with lying flat.  Pt does not normally wear home O2.  Pt's RA O2 sat in triage was 74%.  Pt placed on 2L oxygen via [place].  Pt denies f/c.  No cough.  He's been covid vaccinated + 1 booster.  Pt has not taken any of his am meds yet, but has been taking them until today.      Past Medical History:  Diagnosis Date   CHF (congestive heart failure) ([place])    Diabetes mellitus    Hypertension     Patient Active Problem List   Diagnosis Date Noted   Pleural effusion on right 10/01/2020   Uncontrolled type 2 diabetes mellitus with hyperglycemia ([place]) 06/16/2020   Community acquired pneumonia of right lower lobe of lung    CHF exacerbation ([place]) 06/14/2020   Protein calorie malnutrition ([place]) 06/14/2020   CKD (chronic kidney disease) stage 3, GFR 30-59 ml/min ([place]) 05/03/2020   Hypertensive urgency 05/03/2020   Blurred vision, bilateral 05/03/2020   Acute on chronic diastolic (congestive) heart failure ([place]) 05/03/2020   Essential hypertension    Type 2 diabetes with nephropathy (HCC)    Acute on chronic diastolic CHF (congestive heart failure) ([place])    Acute renal failure superimposed on stage 3a chronic kidney disease ([place])    Class 2 obesity    Bilateral leg edema 04/05/2020    Past Surgical History:  Procedure Laterality Date   INCISION AND DRAINAGE     KNEE SURGERY         Family History  Problem Relation Age of Onset   Dementia Mother    Diabetes Father     Social History   Tobacco Use   Smoking status: Never   Smokeless tobacco: Never  Vaping Use   Vaping Use: Never used   Substance Use Topics   Alcohol use: No   Drug use: No    Home Medications Prior to Admission medications   Medication Sig Start Date End Date Taking? Authorizing Provider  acetaminophen (TYLENOL) 325 MG tablet Take 2 tablets (650 mg total) by mouth every 6 (six) hours as needed for mild pain (or Fever >/= 101). 10/01/20   Roxan Hockey, MD  amLODipine (NORVASC) 10 MG tablet Take 1 tablet (10 mg total) by mouth daily. 10/01/20   Roxan Hockey, MD  Garlic Oil 2423 MG CAPS Take by mouth.    [provider]  glimepiride (AMARYL) 4 MG tablet Take 2 mg by mouth 2 (two) times daily. Half tab  2 x a day    [provider]  hydrALAZINE (APRESOLINE) 50 MG tablet Take 1.5 tablets (75 mg total) by mouth 3 (three) times daily. 02/11/21 05/12/21  [name], MD  labetalol (NORMODYNE) 200 MG tablet Take 400 mg by mouth 2 (two) times daily.    [provider]  metolazone (ZAROXOLYN) 2.5 MG tablet Take 2.5 mg by mouth once a week.    [provider]  Multiple Vitamin (MULTIVITAMIN WITH MINERALS) TABS tablet Take 1 tablet  by mouth daily.    [provider]  pravastatin (PRAVACHOL) 40 MG tablet Take 40 mg by mouth in the morning.     [provider]  torsemide (DEMADEX) 20 MG tablet Take 4 tablets (80 mg total) by mouth 2 (two) times daily. 12/29/20   Larey Dresser, MD  Zinc 50 MG CAPS Take by mouth.    [provider]    Allergies    Patient has no known allergies.  Review of Systems   Review of Systems  Respiratory:  Positive for shortness of breath.   All other systems reviewed and are negative.  Physical Exam Updated Vital Signs BP (!) 190/109   Pulse 90   Temp 98.3 F (36.8 C) (Oral)   Resp (!) 29   Ht 5\' 11"  (1.803 m)   Wt 126.1 kg   SpO2 100%   BMI 38.77 kg/m   Physical Exam Vitals and nursing note reviewed.  Constitutional:      General: He is in acute distress.     Appearance: He is well-developed. He is  obese.  HENT:     Head: Normocephalic and atraumatic.     Mouth/Throat:     Mouth: Mucous membranes are moist.     Pharynx: Oropharynx is clear.  Eyes:     Extraocular Movements: Extraocular movements intact.     Pupils: Pupils are equal, round, and reactive to light.  Cardiovascular:     Rate and Rhythm: Regular rhythm. Tachycardia present.  Pulmonary:     Effort: Tachypnea present.     Breath sounds: Rhonchi present.  Abdominal:     General: Bowel sounds are normal.     Palpations: Abdomen is soft.  Musculoskeletal:        General: Normal range of motion.     Cervical back: Normal range of motion and neck supple.     Right lower leg: Edema present.     Left lower leg: Edema present.  Skin:    General: Skin is warm and dry.     Capillary Refill: Capillary refill takes less than 2 seconds.  Neurological:     General: No focal deficit present.     Mental Status: He is alert and oriented to person, place, and time.  Psychiatric:        Mood and Affect: Mood normal.        Behavior: Behavior normal.    ED Results / Procedures / Treatments   Labs (all labs ordered are listed, but only abnormal results are displayed) Labs Reviewed  BASIC METABOLIC PANEL - Abnormal; Notable for the following components:      Result Value   Potassium 5.5 (*)    Glucose, Bld 123 (*)    BUN 62 (*)    Creatinine, Ser 3.36 (*)    Calcium 8.1 (*)    GFR, Estimated 21 (*)    All other components within normal limits  BRAIN NATRIURETIC PEPTIDE - Abnormal; Notable for the following components:   B Natriuretic Peptide 461.0 (*)    All other components within normal limits  CBC WITH DIFFERENTIAL/PLATELET - Abnormal; Notable for the following components:   RBC 3.53 (*)    Hemoglobin 9.9 (*)    HCT 31.4 (*)    All other components within normal limits  TROPONIN I (HIGH SENSITIVITY) - Abnormal; Notable for the following components:   Troponin I (High Sensitivity) 22 (*)    All other components  within normal limits  RESP PANEL  BY RT-PCR (FLU A&B, COVID) [id number]    EKG EKG Interpretation  Date/Time:  Saturday April 16 2021 09:04:43 EDT Ventricular Rate:  90 PR Interval:  174 QRS Duration: 95 QT Interval:  375 QTC Calculation: 459 R Axis:   86 Text Interpretation: Sinus rhythm No significant change since last tracing Confirmed by Isla Pence [phone]) on 04/16/2021 9:12:32 AM  Radiology DG Chest Port 1 View  Result Date: 04/16/2021 CLINICAL DATA:  55 year old male with shortness of breath for several days. EXAM: PORTABLE CHEST 1 VIEW COMPARISON:  Chest radiographs 09/30/2020 and earlier. FINDINGS: Portable AP upright view at 0838 hours. Unresolved right pleural effusion, now partially tracking into the minor fissure. Stable hypo ventilation at the right lung base. Mildly lower lung volumes overall. Mediastinal contours remain within normal limits. Stable left lung. No pneumothorax. Perihilar pulmonary vascularity appears increased. Visualized tracheal air column is within normal limits. No acute osseous abnormality identified. Paucity of bowel gas in the upper abdomen. IMPRESSION: 1. Persistent moderate right pleural effusion since March. Stable associated right lung base hypoventilation. 2. Interval increased pulmonary vascularity suggesting acute pulmonary edema. Electronically Signed   By: Genevie Ann M.D.   On: 04/16/2021 08:49    Procedures Procedures   Medications Ordered in ED Medications  furosemide (LASIX) injection 40 mg (40 mg Intravenous Given 04/16/21 0849)  amLODipine (NORVASC) tablet 10 mg (10 mg Oral Given 04/16/21 0854)  hydrALAZINE (APRESOLINE) tablet 75 mg (75 mg Oral Given 04/16/21 0854)  labetalol (NORMODYNE) injection 10 mg (10 mg Intravenous Given 04/16/21 0855)    ED Course  I have reviewed the triage vital signs and the nursing notes.  Pertinent labs & imaging results that were available during my care of the patient were reviewed by me and considered  in my medical decision making (see chart for details).    MDM Rules/Calculators/A&P                           Oxygen increased to 3L as O2 sat was only 88 on 2L.  Now O2 sat in the mid-90s.  Pt given IV lasix and his home bp meds.  Covid negative.  Pt d/w Dr. Carles Collet ([facility]) for admission.  CRITICAL CARE Performed by: Isla Pence   Total critical care time: 30 minutes  Critical care time was exclusive of separately billable procedures and treating other patients.  Critical care was necessary to treat or prevent imminent or life-threatening deterioration.  Critical care was time spent personally by me on the following activities: development of treatment plan with patient and/or surrogate as well as nursing, discussions with consultants, evaluation of patient's response to treatment, examination of patient, obtaining history from patient or surrogate, ordering and performing treatments and interventions, ordering and review of laboratory studies, ordering and review of radiographic studies, pulse oximetry and re-evaluation of patient's condition.  Final Clinical Impression(s) / ED Diagnoses Final diagnoses:  Acute on chronic congestive heart failure, unspecified heart failure type ([place])  Acute respiratory failure with hypoxia (HCC)  CKD (chronic kidney disease) stage 4, GFR 15-29 ml/min (HCC)  Pleural effusion on right  Hypertensive urgency    Rx / DC Orders ED Discharge Orders     None        Isla Pence, MD 04/16/21 [phone]

## 2021-04-16 NOTE — ED Triage Notes (Signed)
Pt states he has shortness of breath that has been going on for the past year. Pt denies wearing oxygen at home and had oxygen saturations of 74 on room air.  Placed on 2 liters [place]

## 2021-04-16 NOTE — H&P (Signed)
History and Physical  [name] YQI:[id number] DOB: 1965-12-02 DOA: 04/16/2021   PCP: [name], MD   Patient coming from: Home  Chief Complaint: sob  HPI:  [name] is a 55 y.o. male with medical history of diabetes mellitus type 2, CKD stage III, diastolic CHF, hyperlipidemia presenting with 2-week history of worsening shortness of breath.  He states that he has had exertional dyspnea for greater than 1 year at this time.  He denies any fevers, chills, headache, chest pain, coughing, hemoptysis, nausea, vomiting, diarrhea, abdominal pain.  He complains of worsening lower extremity edema and increasing abdominal girth.  He has orthopnea type symptoms.  He endorses compliance with all his medications.  However, the patient states that he drinks at least 3 x 20 ounce bottles of water daily.  He last saw Dr. Loralie Champagne on 12/29/2020 when his torsemide was increased to 80 mg twice daily.  In the office, his weight was 287 pounds.  He last followed up in the CHF clinic on 02/18/2021.  On that day, his weight was 282 pounds.  His torsemide was increased to 100 mg twice daily x2 days then back to 80 mg twice daily.  He was continued on hydralazine 75 mg twice daily, labetalol 400 mg twice daily and amlodipine 10 mg daily.  In the emergency department, the patient was afebrile hemodynamically stable.  Initial oxygen saturation was 74% on room air.  He was placed on 3 L with saturation up to 100%.  BMP showed a sodium 140, potassium 5.5, serum creatinine 3.36.  BNP was 461.  WBC 6.2, hemoglobin 9.9, platelets 344,000.  The patient was given 40 mg IV furosemide.  I I have ordered an additional 40 mg to be given IV.  Chest x-ray showed increasing pulmonary interstitial markings.  Assessment/Plan: Acute on chronic diastolic CHF -3/87/5643 echo EF 55%, no WMA, grade 2 DD, trivial MR -Start furosemide 80 mg IV twice daily -Fluid restrict -Accurate I's and O's -There are some concerns  regarding the patient's indiscretion with fluids as well as missing doses of his medications  Acute respiratory failure with hypoxia -due to pulmonary edema -74% on RA -stable on 3L  Essential hypertension -Restart hydralazine, labetalol -Anticipate improvement with IV furosemide  Diabetes mellitus type 2 -01/18/2021 hemoglobin A1c 6.7 -NovoLog sliding scale -Holding Amaryl  CKD stage IV -His renal baseline has been widely variable secondary to his diuretic adjustments -Primarily baseline 2.7-3.0  Hyperlipidemia -Continue statin -Monitor with diuresis        Past Medical History:  Diagnosis Date   CHF (congestive heart failure) ([place])    Diabetes mellitus    Hypertension    Past Surgical History:  Procedure Laterality Date   INCISION AND DRAINAGE     KNEE SURGERY     Social History:  reports that he has never smoked. He has never used smokeless tobacco. He reports that he does not drink alcohol and does not use drugs.   Family History  Problem Relation Age of Onset   Dementia Mother    Diabetes Father      No Known Allergies   Prior to Admission medications   Medication Sig Start Date End Date Taking? Authorizing Provider  acetaminophen (TYLENOL) 325 MG tablet Take 2 tablets (650 mg total) by mouth every 6 (six) hours as needed for mild pain (or Fever >/= 101). 10/01/20   Roxan Hockey, MD  amLODipine (NORVASC) 10 MG tablet Take 1 tablet (10  mg total) by mouth daily. 10/01/20   Roxan Hockey, MD  Garlic Oil 8250 MG CAPS Take by mouth.    [provider]  glimepiride (AMARYL) 4 MG tablet Take 2 mg by mouth 2 (two) times daily. Half tab  2 x a day    [provider]  hydrALAZINE (APRESOLINE) 50 MG tablet Take 1.5 tablets (75 mg total) by mouth 3 (three) times daily. 02/11/21 05/12/21  [name], MD  labetalol (NORMODYNE) 200 MG tablet Take 400 mg by mouth 2 (two) times daily.    [provider]  metolazone (ZAROXOLYN)  2.5 MG tablet Take 2.5 mg by mouth once a week.    [provider]  Multiple Vitamin (MULTIVITAMIN WITH MINERALS) TABS tablet Take 1 tablet by mouth daily.    [provider]  pravastatin (PRAVACHOL) 40 MG tablet Take 40 mg by mouth in the morning.     [provider]  torsemide (DEMADEX) 20 MG tablet Take 4 tablets (80 mg total) by mouth 2 (two) times daily. 12/29/20   Larey Dresser, MD  Zinc 50 MG CAPS Take by mouth.    [provider]    Review of Systems:  Constitutional:  No weight loss, night sweats, Fevers, chills, fatigue.  Head&Eyes: No headache.  No vision loss.  No eye pain or scotoma ENT:  No Difficulty swallowing,Tooth/dental problems,Sore throat,  No ear ache, post nasal drip,  Cardio-vascular:  No chest pain, dizziness, palpitations  GI:  No  abdominal pain, nausea, vomiting, diarrhea, loss of appetite, hematochezia, melena, heartburn, indigestion, Resp:  No shortness of breath with exertion or at rest. No cough. No coughing up of blood .No wheezing.No chest wall deformity  Skin:  no rash or lesions.  GU:  no dysuria, change in color of urine, no urgency or frequency. No flank pain.  Musculoskeletal:  No joint pain or swelling. No decreased range of motion. No back pain.  Psych:  No change in mood or affect. No depression or anxiety. Neurologic: No headache, no dysesthesia, no focal weakness, no vision loss. No syncope  Physical Exam: Vitals:   04/16/21 0815 04/16/21 0816 04/16/21 0902 04/16/21 0930  BP:   (!) 190/109 (!) 181/108  Pulse:   90 89  Resp:   (!) 29 (!) 28  Temp:      TempSrc:      SpO2:   100% 100%  Weight: 126.1 kg 126.1 kg    Height: 5\' 11"  (1.803 m) 5\' 11"  (1.803 m)     General:  A&O x 3, NAD, nontoxic, pleasant/cooperative Head/Eye: No conjunctival hemorrhage, no icterus, [place]/AT, No nystagmus ENT:  No icterus,  No thrush, good dentition, no pharyngeal exudate Neck:  No masses, no lymphadenpathy, no  bruits CV:  RRR, no rub, no gallop, no S3 Lung:  bilateral crackles. No wheeze Abdomen: soft/NT, +BS, nondistended, no peritoneal signs Ext: No cyanosis, No rashes, No petechiae, No lymphangitis, 2 + LE edema Neuro: CNII-XII intact, strength 4/5 in bilateral upper and lower extremities, no dysmetria  Labs on Admission:  Basic Metabolic Panel: Recent Labs  Lab 04/16/21 0847  NA 140  K 5.5*  CL 111  CO2 22  GLUCOSE 123*  BUN 62*  CREATININE 3.36*  CALCIUM 8.1*   Liver Function Tests: No results for input(s): AST, ALT, ALKPHOS, BILITOT, PROT, ALBUMIN in the last 168 hours. No results for input(s): LIPASE, AMYLASE in the last 168 hours. No results for input(s): AMMONIA in the last 168  hours. CBC: Recent Labs  Lab 04/16/21 0847  WBC 6.2  NEUTROABS 4.4  HGB 9.9*  HCT 31.4*  MCV 89.0  PLT 344   Coagulation Profile: No results for input(s): INR, PROTIME in the last 168 hours. Cardiac Enzymes: No results for input(s): CKTOTAL, CKMB, CKMBINDEX, TROPONINI in the last 168 hours. BNP: Invalid input(s): POCBNP CBG: No results for input(s): GLUCAP in the last 168 hours. Urine analysis:    Component Value Date/Time   COLORURINE YELLOW 06/14/2020 [place] 06/14/2020 1452   LABSPEC 1.010 06/14/2020 1452   PHURINE 5.0 06/14/2020 1452   GLUCOSEU NEGATIVE 06/14/2020 1452   HGBUR NEGATIVE 06/14/2020 1452   BILIRUBINUR NEGATIVE 06/14/2020 1452   KETONESUR NEGATIVE 06/14/2020 1452   PROTEINUR 100 (A) 06/14/2020 1452   NITRITE NEGATIVE 06/14/2020 1452   LEUKOCYTESUR NEGATIVE 06/14/2020 1452   Sepsis Labs: @LABRCNTIP (procalcitonin:4,lacticidven:4) ) Recent Results (from the past 240 hour(s))  Resp Panel by RT-PCR (Flu A&B, Covid) Nasopharyngeal Swab     Status: None   Collection Time: 04/16/21  8:50 AM   Specimen: Nasopharyngeal Swab; Nasopharyngeal(NP) swabs in vial transport medium  Result Value Ref Range Status   SARS Coronavirus 2 by RT PCR NEGATIVE  NEGATIVE Final    Comment: (NOTE) SARS-CoV-2 target nucleic acids are NOT DETECTED.  The SARS-CoV-2 RNA is generally detectable in upper respiratory specimens during the acute phase of infection. The lowest concentration of SARS-CoV-2 viral copies this assay can detect is 138 copies/mL. A negative result does not preclude SARS-Cov-2 infection and should not be used as the sole basis for treatment or other patient management decisions. A negative result may occur with  improper specimen collection/handling, submission of specimen other than nasopharyngeal swab, presence of viral mutation(s) within the areas targeted by this assay, and inadequate number of viral copies(<138 copies/mL). A negative result must be combined with clinical observations, patient history, and epidemiological information. The expected result is Negative.  Fact Sheet for Patients:  EntrepreneurPulse.com.au  Fact Sheet for Healthcare Providers:  IncredibleEmployment.be  This test is no t yet approved or cleared by the [place] FDA and  has been authorized for detection and/or diagnosis of SARS-CoV-2 by FDA under an Emergency Use Authorization (EUA). This EUA will remain  in effect (meaning this test can be used) for the duration of the COVID-19 declaration under Section 564(b)(1) of the Act, 21 U.S.C.section 360bbb-3(b)(1), unless the authorization is terminated  or revoked sooner.       Influenza A by PCR NEGATIVE NEGATIVE Final   Influenza B by PCR NEGATIVE NEGATIVE Final    Comment: (NOTE) The Xpert Xpress SARS-CoV-2/FLU/RSV plus assay is intended as an aid in the diagnosis of influenza from Nasopharyngeal swab specimens and should not be used as a sole basis for treatment. Nasal washings and aspirates are unacceptable for Xpert Xpress SARS-CoV-2/FLU/RSV testing.  Fact Sheet for Patients: EntrepreneurPulse.com.au  Fact Sheet for Healthcare  Providers: IncredibleEmployment.be  This test is not yet approved or cleared by the [place] FDA and has been authorized for detection and/or diagnosis of SARS-CoV-2 by FDA under an Emergency Use Authorization (EUA). This EUA will remain in effect (meaning this test can be used) for the duration of the COVID-19 declaration under Section 564(b)(1) of the Act, 21 U.S.C. section 360bbb-3(b)(1), unless the authorization is terminated or revoked.  Performed at [facility], [address], [place], [place] [address]      Radiological Exams on Admission: DG Chest [facility] 1 View  Result Date:  04/16/2021 CLINICAL DATA:  55 year old male with shortness of breath for several days. EXAM: PORTABLE CHEST 1 VIEW COMPARISON:  Chest radiographs 09/30/2020 and earlier. FINDINGS: Portable AP upright view at 0838 hours. Unresolved right pleural effusion, now partially tracking into the minor fissure. Stable hypo ventilation at the right lung base. Mildly lower lung volumes overall. Mediastinal contours remain within normal limits. Stable left lung. No pneumothorax. Perihilar pulmonary vascularity appears increased. Visualized tracheal air column is within normal limits. No acute osseous abnormality identified. Paucity of bowel gas in the upper abdomen. IMPRESSION: 1. Persistent moderate right pleural effusion since March. Stable associated right lung base hypoventilation. 2. Interval increased pulmonary vascularity suggesting acute pulmonary edema. Electronically Signed   By: Genevie Ann M.D.   On: 04/16/2021 08:49    EKG: Independently reviewed. Sinus, no STT change    Time spent:60 minutes Code Status:   FULL Family Communication:  No Family at bedside Disposition Plan: expect 2-3day hospitalization Consults called: none DVT Prophylaxis: [place] Heparin    Orson Eva, DO  [facility] Pager [phone]  If 7PM-7AM, please contact night-coverage [email or web address] Password  [facility] 04/16/2021, 10:11 AM

## 2021-04-16 NOTE — ED Notes (Signed)
Pt eating dinner tray °

## 2021-04-17 ENCOUNTER — Inpatient Hospital Stay (HOSPITAL_COMMUNITY): Payer: Commercial Managed Care - PPO

## 2021-04-17 ENCOUNTER — Encounter (HOSPITAL_COMMUNITY): Payer: Self-pay | Admitting: Internal Medicine

## 2021-04-17 DIAGNOSIS — I160 Hypertensive urgency: Secondary | ICD-10-CM

## 2021-04-17 DIAGNOSIS — I10 Essential (primary) hypertension: Secondary | ICD-10-CM | POA: Diagnosis not present

## 2021-04-17 DIAGNOSIS — J9601 Acute respiratory failure with hypoxia: Secondary | ICD-10-CM | POA: Diagnosis not present

## 2021-04-17 DIAGNOSIS — I5033 Acute on chronic diastolic (congestive) heart failure: Secondary | ICD-10-CM | POA: Diagnosis not present

## 2021-04-17 DIAGNOSIS — N184 Chronic kidney disease, stage 4 (severe): Secondary | ICD-10-CM | POA: Diagnosis not present

## 2021-04-17 LAB — ECHOCARDIOGRAM COMPLETE
AR max vel: 2.31 cm2
AV Area VTI: 1.83 cm2
AV Area mean vel: 2.32 cm2
AV Mean grad: 3.9 mmHg
AV Peak grad: 8 mmHg
Ao pk vel: 1.41 m/s
Area-P 1/2: 5.06 cm2
Height: 71 in
S' Lateral: 3.5 cm
Single Plane A4C EF: 63.5 %
Weight: 4448 oz

## 2021-04-17 LAB — GLUCOSE, CAPILLARY
Glucose-Capillary: 95 mg/dL (ref 70–99)
Glucose-Capillary: 155 mg/dL — ABNORMAL HIGH (ref 70–99)
Glucose-Capillary: 158 mg/dL — ABNORMAL HIGH (ref 70–99)
Glucose-Capillary: 136 mg/dL — ABNORMAL HIGH (ref 70–99)

## 2021-04-17 LAB — BASIC METABOLIC PANEL
Anion gap: 6 (ref 5–15)
BUN: 63 mg/dL — ABNORMAL HIGH (ref 6–20)
CO2: 24 mmol/L (ref 22–32)
Calcium: 8.2 mg/dL — ABNORMAL LOW (ref 8.9–10.3)
Chloride: 113 mmol/L — ABNORMAL HIGH (ref 98–111)
Creatinine, Ser: 3.59 mg/dL — ABNORMAL HIGH (ref 0.61–1.24)
GFR, Estimated: 19 mL/min — ABNORMAL LOW (ref >60)
Glucose, Bld: 99 mg/dL (ref 70–99)
Potassium: 5.5 mmol/L — ABNORMAL HIGH (ref 3.5–5.1)
Sodium: 143 mmol/L (ref 135–145)

## 2021-04-17 MED ORDER — GUAIFENESIN-DM 100-10 MG/5ML PO SYRP
5.0000 mL | ORAL_SOLUTION | ORAL | Status: DC | PRN
  Administered 2021-04-17 – 2021-04-18 (×3): 5 mL via ORAL
  Filled 2021-04-17 (×3): qty 5

## 2021-04-17 NOTE — Progress Notes (Addendum)
PROGRESS NOTE  [name]:[id number] DOB: [birth year] DOA: 04/16/2021 PCP: [name], MD  Brief History:  55 y.o. male with medical history of diabetes mellitus type 2, CKD stage III, diastolic CHF, hyperlipidemia presenting with 2-week history of worsening shortness of breath.  He states that he has had exertional dyspnea for greater than 1 year at this time.  He denies any fevers, chills, headache, chest pain, coughing, hemoptysis, nausea, vomiting, diarrhea, abdominal pain.  He complains of worsening lower extremity edema and increasing abdominal girth.  He has orthopnea type symptoms.  He endorses compliance with all his medications.  However, the patient states that he drinks at least 3 x 20 ounce bottles of water daily.  He last saw Dr. Loralie Champagne on 12/29/2020 when his torsemide was increased to 80 mg twice daily.  In the office, his weight was 287 pounds.  He last followed up in the CHF clinic on 02/18/2021.  On that day, his weight was 282 pounds.  His torsemide was increased to 100 mg twice daily x2 days then back to 80 mg twice daily.  He was continued on hydralazine 75 mg twice daily, labetalol 400 mg twice daily and amlodipine 10 mg daily.   In the emergency department, the patient was afebrile hemodynamically stable.  Initial oxygen saturation was 74% on room air.  He was placed on 3 L with saturation up to 100%.  BMP showed a sodium 140, potassium 5.5, serum creatinine 3.36.  BNP was 461.  WBC 6.2, hemoglobin 9.9, platelets 344,000.  The patient was given 40 mg IV furosemide.  I I have ordered an additional 40 mg to be given IV.  Chest x-ray showed increasing pulmonary interstitial markings.  Assessment/Plan: Acute on chronic diastolic CHF -1/54/0086 echo EF 55%, no WMA, grade 2 DD, trivial MR -Start furosemide 80 mg IV twice daily -Fluid restrict -Accurate I's and O's -patient endorses indiscretion with fluids as well as missing doses of his medications -9/25  standing weight 293 -04/17/21 Echo--EF 55-60%, no WMA, mild TR   Acute respiratory failure with hypoxia -due to pulmonary edema -74% on RA -stable on 3L   Essential hypertension -Restart hydralazine, labetalol -Anticipate improvement with IV furosemide   Diabetes mellitus type 2 -01/18/2021 hemoglobin A1c 6.7 -04/16/21 A1C--6.6 -NovoLog sliding scale -Holding Amaryl   Acute on Chronic Renal Failure--CKD stage IV -His renal baseline has been widely variable secondary to his diuretic adjustments -Primarily baseline 2.7-3.0 -will need to tolerate worsen renal function for euvolemia   Hyperlipidemia -Continue statin -Monitor with diuresis  Hyperkalemia -anticipate improvement with diuresis    Status is: Inpatient  Remains inpatient appropriate because:Inpatient level of care appropriate due to severity of illness  Dispo: The patient is from: Home              Anticipated d/c is to: Home              Patient currently is not medically stable to d/c.   Difficult to place patient No        Family Communication:   Spouse updated at bedside 9/25  Consultants:  none  Code Status:  FULL  DVT Prophylaxis:  [place] Heparin    Procedures: As Listed in Progress Note Above  Antibiotics: None      Subjective: Patient is breathing a little better but has dyspnea on exertion and orthopnea.  Denies f/c, cp, n/v/d, abd pain. Headache.  Objective: Vitals:  04/17/21 0206 04/17/21 0620 04/17/21 0900 04/17/21 1421  BP: 139/84 133/69 (!) 149/89 122/73  Pulse: 78 85 86 79  Resp: 20 18 18 16   Temp: 97.9 F (36.6 C) 98.4 F (36.9 C)    TempSrc: Oral Oral    SpO2: 95% 98% 97% 98%  Weight:      Height:        Intake/Output Summary (Last 24 hours) at 04/17/2021 1614 Last data filed at 04/17/2021 1056 Gross per 24 hour  Intake 3 ml  Output 650 ml  Net -647 ml   Weight change:  Exam:  General:  Pt is alert, follows commands appropriately, not in acute  distress HEENT: No icterus, No thrush, No neck mass, [place]/AT Cardiovascular: RRR, S1/S2, no rubs, no gallops Respiratory: bibasilar crackles. No wheeze Abdomen: Soft/+BS, non tender, non distended, no guarding Extremities: 2 + LE edema, No lymphangitis, No petechiae, No rashes, no synovitis   Data Reviewed: I have personally reviewed following labs and imaging studies Basic Metabolic Panel: Recent Labs  Lab 04/16/21 0847 04/17/21 0547  NA 140 143  K 5.5* 5.5*  CL 111 113*  CO2 22 24  GLUCOSE 123* 99  BUN 62* 63*  CREATININE 3.36* 3.59*  CALCIUM 8.1* 8.2*   Liver Function Tests: No results for input(s): AST, ALT, ALKPHOS, BILITOT, PROT, ALBUMIN in the last 168 hours. No results for input(s): LIPASE, AMYLASE in the last 168 hours. No results for input(s): AMMONIA in the last 168 hours. Coagulation Profile: No results for input(s): INR, PROTIME in the last 168 hours. CBC: Recent Labs  Lab 04/16/21 0847  WBC 6.2  NEUTROABS 4.4  HGB 9.9*  HCT 31.4*  MCV 89.0  PLT 344   Cardiac Enzymes: No results for input(s): CKTOTAL, CKMB, CKMBINDEX, TROPONINI in the last 168 hours. BNP: Invalid input(s): POCBNP CBG: Recent Labs  Lab 04/16/21 2102 04/17/21 0722 04/17/21 1056  GLUCAP 161* 95 155*   HbA1C: Recent Labs    04/16/21 0847  HGBA1C 6.6*   Urine analysis:    Component Value Date/Time   COLORURINE YELLOW 06/14/2020 [place] 06/14/2020 1452   LABSPEC 1.010 06/14/2020 1452   PHURINE 5.0 06/14/2020 1452   GLUCOSEU NEGATIVE 06/14/2020 1452   HGBUR NEGATIVE 06/14/2020 1452   BILIRUBINUR NEGATIVE 06/14/2020 1452   KETONESUR NEGATIVE 06/14/2020 1452   PROTEINUR 100 (A) 06/14/2020 1452   NITRITE NEGATIVE 06/14/2020 1452   LEUKOCYTESUR NEGATIVE 06/14/2020 1452   Sepsis Labs: @LABRCNTIP (procalcitonin:4,lacticidven:4) ) Recent Results (from the past 240 hour(s))  Resp Panel by RT-PCR (Flu A&B, Covid) Nasopharyngeal Swab     Status: None    Collection Time: 04/16/21  8:50 AM   Specimen: Nasopharyngeal Swab; Nasopharyngeal(NP) swabs in vial transport medium  Result Value Ref Range Status   SARS Coronavirus 2 by RT PCR NEGATIVE NEGATIVE Final    Comment: (NOTE) SARS-CoV-2 target nucleic acids are NOT DETECTED.  The SARS-CoV-2 RNA is generally detectable in upper respiratory specimens during the acute phase of infection. The lowest concentration of SARS-CoV-2 viral copies this assay can detect is 138 copies/mL. A negative result does not preclude SARS-Cov-2 infection and should not be used as the sole basis for treatment or other patient management decisions. A negative result may occur with  improper specimen collection/handling, submission of specimen other than nasopharyngeal swab, presence of viral mutation(s) within the areas targeted by this assay, and inadequate number of viral copies(<138 copies/mL). A negative result must be combined with clinical observations, patient history, and  epidemiological information. The expected result is Negative.  Fact Sheet for Patients:  EntrepreneurPulse.com.au  Fact Sheet for Healthcare Providers:  IncredibleEmployment.be  This test is no t yet approved or cleared by the [place] FDA and  has been authorized for detection and/or diagnosis of SARS-CoV-2 by FDA under an Emergency Use Authorization (EUA). This EUA will remain  in effect (meaning this test can be used) for the duration of the COVID-19 declaration under Section 564(b)(1) of the Act, 21 U.S.C.section 360bbb-3(b)(1), unless the authorization is terminated  or revoked sooner.       Influenza A by PCR NEGATIVE NEGATIVE Final   Influenza B by PCR NEGATIVE NEGATIVE Final    Comment: (NOTE) The Xpert Xpress SARS-CoV-2/FLU/RSV plus assay is intended as an aid in the diagnosis of influenza from Nasopharyngeal swab specimens and should not be used as a sole basis for treatment.  Nasal washings and aspirates are unacceptable for Xpert Xpress SARS-CoV-2/FLU/RSV testing.  Fact Sheet for Patients: EntrepreneurPulse.com.au  Fact Sheet for Healthcare Providers: IncredibleEmployment.be  This test is not yet approved or cleared by the [place] FDA and has been authorized for detection and/or diagnosis of SARS-CoV-2 by FDA under an Emergency Use Authorization (EUA). This EUA will remain in effect (meaning this test can be used) for the duration of the COVID-19 declaration under Section 564(b)(1) of the Act, 21 U.S.C. section 360bbb-3(b)(1), unless the authorization is terminated or revoked.  Performed at [facility], [address] [place], [place] [address]      Scheduled Meds:  amLODipine  10 mg Oral Daily   furosemide  80 mg Intravenous BID   heparin  5,000 Units Subcutaneous Q8H   hydrALAZINE  75 mg Oral TID   insulin aspart  0-9 Units Subcutaneous TID WC   labetalol  400 mg Oral BID   pravastatin  40 mg Oral q AM   sodium chloride flush  3 mL Intravenous Q12H   Continuous Infusions:  sodium chloride      Procedures/Studies: DG Chest Port 1 View  Result Date: 04/16/2021 CLINICAL DATA:  55 year old male with shortness of breath for several days. EXAM: PORTABLE CHEST 1 VIEW COMPARISON:  Chest radiographs 09/30/2020 and earlier. FINDINGS: Portable AP upright view at 0838 hours. Unresolved right pleural effusion, now partially tracking into the minor fissure. Stable hypo ventilation at the right lung base. Mildly lower lung volumes overall. Mediastinal contours remain within normal limits. Stable left lung. No pneumothorax. Perihilar pulmonary vascularity appears increased. Visualized tracheal air column is within normal limits. No acute osseous abnormality identified. Paucity of bowel gas in the upper abdomen. IMPRESSION: 1. Persistent moderate right pleural effusion since March. Stable associated right lung base  hypoventilation. 2. Interval increased pulmonary vascularity suggesting acute pulmonary edema. Electronically Signed   By: Genevie Ann M.D.   On: 04/16/2021 08:49   ECHOCARDIOGRAM COMPLETE  Result Date: 04/17/2021    ECHOCARDIOGRAM REPORT   Patient Name:   [name] Date of Exam: 04/17/2021 Medical Rec #:  [id number]       Height:       71.0 in Accession #:    [id number]      Weight:       278.0 lb Date of Birth:  06-23-1966       BSA:          2.425 m Patient Age:    66 years        BP:  149/89 mmHg Patient Gender: M               HR:           86 bpm. Exam Location:  [facility] Procedure: 2D Echo, Cardiac Doppler and Color Doppler Indications:    CHF  History:        Patient has prior history of Echocardiogram examinations, most                 recent 04/06/2020. CHF, Signs/Symptoms:Shortness of Breath, Edema                 and CKD, Resp. distress; Risk Factors:Hypertension, Diabetes,                 Dyslipidemia and Morbid obesity.  Sonographer:    Dustin Flock RDCS Referring Phys: [phone] Thandiwe Siragusa  Sonographer Comments: Patient is morbidly obese and Technically difficult study due to poor echo windows. IMPRESSIONS  1. Left ventricular ejection fraction, by estimation, is 55 to 60%. The left ventricle has normal function. The left ventricle has no regional wall motion abnormalities. There is moderate left ventricular hypertrophy. Left ventricular diastolic parameters are indeterminate.  2. Right ventricular systolic function is normal. The right ventricular size is normal. There is moderately elevated pulmonary artery systolic pressure.  3. Left atrial size was mildly dilated.  4. The mitral valve is normal in structure. No evidence of mitral valve regurgitation. No evidence of mitral stenosis.  5. The tricuspid valve is abnormal.  6. The aortic valve is tricuspid. Aortic valve regurgitation is not visualized. No aortic stenosis is present.  7. The inferior vena cava is normal in size with greater  than 50% respiratory variability, suggesting right atrial pressure of 3 mmHg. FINDINGS  Left Ventricle: Left ventricular ejection fraction, by estimation, is 55 to 60%. The left ventricle has normal function. The left ventricle has no regional wall motion abnormalities. The left ventricular internal cavity size was normal in size. There is  moderate left ventricular hypertrophy. Left ventricular diastolic parameters are indeterminate. Right Ventricle: The right ventricular size is normal. No increase in right ventricular wall thickness. Right ventricular systolic function is normal. There is moderately elevated pulmonary artery systolic pressure. The tricuspid regurgitant velocity is 3.40 m/s, and with an assumed right atrial pressure of 3 mmHg, the estimated right ventricular systolic pressure is 89.3 mmHg. Left Atrium: Left atrial size was mildly dilated. Right Atrium: Right atrial size was normal in size. Pericardium: There is no evidence of pericardial effusion. Mitral Valve: The mitral valve is normal in structure. No evidence of mitral valve regurgitation. No evidence of mitral valve stenosis. Tricuspid Valve: The tricuspid valve is abnormal. Tricuspid valve regurgitation is mild . No evidence of tricuspid stenosis. Aortic Valve: The aortic valve is tricuspid. Aortic valve regurgitation is not visualized. No aortic stenosis is present. Aortic valve mean gradient measures 3.9 mmHg. Aortic valve peak gradient measures 8.0 mmHg. Aortic valve area, by VTI measures 1.83 cm. Pulmonic Valve: The pulmonic valve was not well visualized. Pulmonic valve regurgitation is mild. No evidence of pulmonic stenosis. Aorta: The aortic root is normal in size and structure. Venous: The inferior vena cava is normal in size with greater than 50% respiratory variability, suggesting right atrial pressure of 3 mmHg. IAS/Shunts: No atrial level shunt detected by color flow Doppler.  LEFT VENTRICLE PLAX 2D LVIDd:         4.70 cm       Diastology LVIDs:  3.50 cm      LV e' medial:    9.01 cm/s LV PW:         1.50 cm      LV E/e' medial:  12.8 LV IVS:        1.40 cm      LV e' lateral:   11.00 cm/s LVOT diam:     2.10 cm      LV E/e' lateral: 10.5 LV SV:         54 LV SV Index:   22 LVOT Area:     3.46 cm  LV Volumes (MOD) LV vol d, MOD A4C: 187.0 ml LV vol s, MOD A4C: 68.3 ml LV SV MOD A4C:     187.0 ml RIGHT VENTRICLE RV Basal diam:  3.40 cm RV S prime:     13.50 cm/s TAPSE (M-mode): 3.0 cm LEFT ATRIUM           Index       RIGHT ATRIUM           Index LA diam:      3.90 cm 1.61 cm/m  RA Area:     16.50 cm LA Vol (A2C): 49.5 ml 20.41 ml/m RA Volume:   47.90 ml  19.75 ml/m LA Vol (A4C): 86.0 ml 35.46 ml/m  AORTIC VALVE AV Area (Vmax):    2.31 cm AV Area (Vmean):   2.32 cm AV Area (VTI):     1.83 cm AV Vmax:           141.17 cm/s AV Vmean:          90.030 cm/s AV VTI:            0.295 m AV Peak Grad:      8.0 mmHg AV Mean Grad:      3.9 mmHg LVOT Vmax:         94.20 cm/s LVOT Vmean:        60.400 cm/s LVOT VTI:          0.156 m LVOT/AV VTI ratio: 0.53  AORTA Ao Root diam: 2.80 cm MITRAL VALVE                TRICUSPID VALVE MV Area (PHT): 5.06 cm     TR Peak grad:   46.2 mmHg MV Decel Time: 150 msec     TR Vmax:        340.00 cm/s MV E velocity: 115.00 cm/s MV A velocity: 66.70 cm/s   SHUNTS MV E/A ratio:  1.72         Systemic VTI:  0.16 m                             Systemic Diam: 2.10 cm Carlyle Dolly MD Electronically signed by Carlyle Dolly MD Signature Date/Time: 04/17/2021/10:51:32 AM    Final     Orson Eva, DO  [facility]  If 7PM-7AM, please contact night-coverage [email or web address] Password [email or web address] 04/17/2021, 4:14 PM   LOS: 1 day

## 2021-04-17 NOTE — Progress Notes (Signed)
  Echocardiogram 2D Echocardiogram has been performed.  [name] 04/17/2021, 9:51 AM

## 2021-04-18 DIAGNOSIS — I5033 Acute on chronic diastolic (congestive) heart failure: Secondary | ICD-10-CM | POA: Diagnosis not present

## 2021-04-18 DIAGNOSIS — J9601 Acute respiratory failure with hypoxia: Secondary | ICD-10-CM | POA: Diagnosis not present

## 2021-04-18 DIAGNOSIS — N184 Chronic kidney disease, stage 4 (severe): Secondary | ICD-10-CM

## 2021-04-18 DIAGNOSIS — N179 Acute kidney failure, unspecified: Secondary | ICD-10-CM | POA: Diagnosis not present

## 2021-04-18 LAB — BASIC METABOLIC PANEL
Anion gap: 7 (ref 5–15)
BUN: 72 mg/dL — ABNORMAL HIGH (ref 6–20)
CO2: 24 mmol/L (ref 22–32)
Calcium: 8 mg/dL — ABNORMAL LOW (ref 8.9–10.3)
Chloride: 111 mmol/L (ref 98–111)
Creatinine, Ser: 4.46 mg/dL — ABNORMAL HIGH (ref 0.61–1.24)
GFR, Estimated: 15 mL/min — ABNORMAL LOW (ref >60)
Glucose, Bld: 105 mg/dL — ABNORMAL HIGH (ref 70–99)
Potassium: 5.3 mmol/L — ABNORMAL HIGH (ref 3.5–5.1)
Sodium: 142 mmol/L (ref 135–145)

## 2021-04-18 LAB — GLUCOSE, CAPILLARY
Glucose-Capillary: 95 mg/dL (ref 70–99)
Glucose-Capillary: 172 mg/dL — ABNORMAL HIGH (ref 70–99)
Glucose-Capillary: 115 mg/dL — ABNORMAL HIGH (ref 70–99)
Glucose-Capillary: 202 mg/dL — ABNORMAL HIGH (ref 70–99)

## 2021-04-18 MED ORDER — FUROSEMIDE 10 MG/ML IJ SOLN
120.0000 mg | Freq: Two times a day (BID) | INTRAVENOUS | Status: DC
  Administered 2021-04-18: 120 mg via INTRAVENOUS
  Filled 2021-04-18 (×4): qty 12

## 2021-04-18 MED ORDER — LIVING BETTER WITH HEART FAILURE BOOK: Freq: Once | Status: DC

## 2021-04-18 NOTE — TOC Progression Note (Signed)
Transition of Care [facility]) - Progression Note    Patient Details  Name: [name] MRN: [id number] Date of Birth: Feb 12, 1966  Transition of [facility] CM/SW Contact  Boneta Lucks, RN Phone Number: 04/18/2021, 10:46 AM  Clinical Narrative:   Patient admitted with acute on chronic diastolic CHF.  TOC consulted for CHF.  Cardiology and Nephrology is working with patient, to explain his advanced kidney disease and discussed the need for vascular access in preparation for dialysis. Living better book ordered. Patient had no change in medication or diet prior to admission. TOC to follow for DC planning.   Expected Discharge Plan: Home/Self Care Barriers to Discharge: Continued Medical Work up  Expected Discharge Plan and Services Expected Discharge Plan: Home/Self Care     Readmission Risk Prevention Plan 04/18/2021 09/27/2020  Transportation Screening Complete Complete  Home Care Screening Complete Complete  Medication Review (RN CM) Complete Complete  Some recent data might be hidden

## 2021-04-18 NOTE — Consult Note (Signed)
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 McLean and [employer], hypertension, DM 2, presented 2 days ago with chronically but progressive exertional dyspnea, orthopnea.  Work-up identified pulmonary edema consistent with acute on chronic HFpEF exacerbation.  He denies any change in diet, missed dosing of diuretics or other cardiac medications.  No chest pain.  No use of NSAIDs.  With admission patient placed on Lasix 80 mg IV twice daily.  Presenting creatinine of 3.3 similar to his very labile baseline, trend below shows creatinine increasing to 4.46 this morning.  Patient had 2 unmeasured voids yesterday.  Morning weight today is 291 pounds which appears to be above his weights at the heart failure clinic most recently.  He continues to have 3-4+ lower extremity edema.  He states his breathing is better, but he has not been very ambulatory, continues to sleep with the head of the bed elevated, and is using supplemental oxygen.  His most recent visit with Dr. Theador Hawthorne of nephrology was 6/30.  Given his advanced kidney disease dialysis preparation was discussed including need for vascular access.  He had a tough time with a conversation and has not yet returned.  Home medications include torsemide 80 mg twice daily, metolazone 2.5 mg once a week, hydralazine, labetalol, amlodipine.   Creatinine, Ser (mg/dL)  Date Value  04/18/2021 4.46 (H)  04/17/2021 3.59 (H)  04/16/2021 3.36 (H)  02/28/2021 3.31 (H)  02/18/2021 3.07 (H)  01/20/2021 4.20 (H)  01/19/2021 4.06 (H)  12/29/2020 4.40 (H)  11/29/2020 2.76 (H)  11/23/2020 2.65 (H)  ] I/Os: I/O last 3 completed shifts: In: 3 [P.O.:360; I.V.:3] Out: 41 [Urine:650]   ROS NSAIDS: denies IV Contrast no exposure TMP/SMX no  exposure Hypotension no exposure Balance of 12 systems is negative w/ exceptions as above  PMH  Past Medical History:  Diagnosis Date   CHF (congestive heart failure) (HCC)    Diabetes mellitus    Hypertension    PSH  Past Surgical History:  Procedure Laterality Date   INCISION AND DRAINAGE     KNEE SURGERY     FH  Family History  Problem Relation Age of Onset   Dementia Mother    Diabetes Father    SH  reports that he has never smoked. He has never used smokeless tobacco. He reports that he does not drink alcohol and does not use drugs. Allergies No Known Allergies Home medications Prior to Admission medications   Medication Sig Start Date End Date Taking? Authorizing Provider  acetaminophen (TYLENOL) 325 MG tablet Take 2 tablets (650 mg total) by mouth every 6 (six) hours as needed for mild pain (or Fever >/= 101). 10/01/20  Yes Emokpae, Courage, MD  amLODipine (NORVASC) 10 MG tablet Take 1 tablet (10 mg total) by mouth daily. 10/01/20  Yes Roxan Hockey, MD  Garlic Oil 5784 MG CAPS Take by mouth.   Yes [provider]  glimepiride (AMARYL) 4 MG tablet Take 2 mg by mouth 2 (two) times daily. Half tab  2 x a day   Yes [provider]  hydrALAZINE (APRESOLINE) 50 MG tablet Take 1.5 tablets (75 mg total) by mouth 3 (three) times daily. 02/11/21 05/12/21 Yes BranchAlphonse Guild, MD  labetalol (NORMODYNE) 200 MG tablet Take 400 mg by mouth 2 (two) times daily.  Yes [provider]  Multiple Vitamin (MULTIVITAMIN WITH MINERALS) TABS tablet Take 1 tablet by mouth daily.   Yes [provider]  pravastatin (PRAVACHOL) 40 MG tablet Take 40 mg by mouth in the morning.    Yes [provider]  torsemide (DEMADEX) 20 MG tablet Take 4 tablets (80 mg total) by mouth 2 (two) times daily. 12/29/20  Yes Larey Dresser, MD  Zinc 50 MG CAPS Take by mouth.   Yes [provider]  metolazone (ZAROXOLYN) 2.5 MG tablet Take 2.5 mg by mouth once a  week.    [provider]    Current Medications Scheduled Meds:  amLODipine  10 mg Oral Daily   furosemide  80 mg Intravenous BID   heparin  5,000 Units Subcutaneous Q8H   hydrALAZINE  75 mg Oral TID   insulin aspart  0-9 Units Subcutaneous TID WC   labetalol  400 mg Oral BID   pravastatin  40 mg Oral q AM   sodium chloride flush  3 mL Intravenous Q12H   Continuous Infusions:  sodium chloride     PRN Meds:.sodium chloride, acetaminophen, guaiFENesin-dextromethorphan, ondansetron (ZOFRAN) IV, sodium chloride flush  CBC Recent Labs  Lab 04/16/21 0847  WBC 6.2  NEUTROABS 4.4  HGB 9.9*  HCT 31.4*  MCV 89.0  PLT 408   Basic Metabolic Panel Recent Labs  Lab 04/16/21 0847 04/17/21 0547 04/18/21 0547  NA 140 143 142  K 5.5* 5.5* 5.3*  CL 111 113* 111  CO2 22 24 24   GLUCOSE 123* 99 105*  BUN 62* 63* 72*  CREATININE 3.36* 3.59* 4.46*  CALCIUM 8.1* 8.2* 8.0*    Physical Exam   Blood pressure (!) 143/79, pulse 86, temperature 99.4 F (37.4 C), temperature source Oral, resp. rate 16, height 5\' 11"  (1.803 m), weight 132.4 kg, SpO2 99 %. GEN: NAD, obese, sitting on edge of bed ENT: NCAT, [place] in place EYES: EOMI CV: distant heart sounds, nl s1s2, no rub PULM: diminished in bases, bronchial on R, no crackles, nl wob ABD: obese, soft SKIN: no rashes/lesions EXT:3-4_+ LEE to the knees b/l NEURO: nonfocal  Assessment 77M AoC HFpEF exacerbation with AoCKD4 while attempting ot diuresis, remains hypervolemic, hypoxic.    AoCKD4, baseline nephrotic CKD followed by Theador Hawthorne; advancing and high chance of dialysis dependence in near/intermediate future AoC HFpEF, hypervolemic, on IV Lasix 80 BID, AHF and Branch follow HTN DM2 Anemia, mild Hyperkalemia, mild, CTM  [employer] lasix to 120 IV TID Discussed that need to improve cardiac / volume status, need to tolerate some change in GFR Will need close nephrology  care as outpt as well Low Na, fluid restricted  diet Daily weights, Daily Renal Panel, Strict I/Os, Avoid nephrotoxins (NSAIDs, judicious IV Contrast)  Will follow along  [name]  04/18/2021, 9:15 AM

## 2021-04-18 NOTE — Progress Notes (Signed)
PROGRESS NOTE  [name] JSH:[id number] DOB: 02-08-1966 DOA: 04/16/2021 PCP: [name], MD   Brief History:  55 y.o. male with medical history of diabetes mellitus type 2, CKD stage III, diastolic CHF, hyperlipidemia presenting with 2-week history of worsening shortness of breath.  He states that he has had exertional dyspnea for greater than 1 year at this time.  He denies any fevers, chills, headache, chest pain, coughing, hemoptysis, nausea, vomiting, diarrhea, abdominal pain.  He complains of worsening lower extremity edema and increasing abdominal girth.  He has orthopnea type symptoms.  He endorses compliance with all his medications.  However, the patient states that he drinks at least 3 x 20 ounce bottles of water daily.  He last saw Dr. Loralie Champagne on 12/29/2020 when his torsemide was increased to 80 mg twice daily.  In the office, his weight was 287 pounds.  He last followed up in the CHF clinic on 02/18/2021.  On that day, his weight was 282 pounds.  His torsemide was increased to 100 mg twice daily x2 days then back to 80 mg twice daily.  He was continued on hydralazine 75 mg twice daily, labetalol 400 mg twice daily and amlodipine 10 mg daily.   In the emergency department, the patient was afebrile hemodynamically stable.  Initial oxygen saturation was 74% on room air.  He was placed on 3 L with saturation up to 100%.  BMP showed a sodium 140, potassium 5.5, serum creatinine 3.36.  BNP was 461.  WBC 6.2, hemoglobin 9.9, platelets 344,000.  The patient was given 40 mg IV furosemide.  I I have ordered an additional 40 mg to be given IV.  Chest x-ray showed increasing pulmonary interstitial markings.   Assessment/Plan: Acute on chronic diastolic CHF -8/50/2774 echo EF 55%, no WMA, grade 2 DD, trivial MR -furosemide 80 mg IV twice daily>>increased to 120 mg TID -Fluid restrict -Accurate I's and O's -patient endorses indiscretion with fluids as well as missing doses of  his medications -9/25 standing weight 293 -04/17/21 Echo--EF 55-60%, no WMA, mild TR   Acute respiratory failure with hypoxia -due to pulmonary edema -74% on RA -stable on 3L   Essential hypertension -Restart hydralazine, labetalol -Anticipate improvement with IV furosemide   Diabetes mellitus type 2 -01/18/2021 hemoglobin A1c 6.7 -04/16/21 A1C--6.6 -NovoLog sliding scale -Holding Amaryl   Acute on Chronic Renal Failure--CKD stage IV -His renal baseline has been widely variable secondary to his diuretic adjustments -Primarily baseline 2.7-3.0 -will need to tolerate worsen renal function for euvolemia -consulted renal--discussed with renal   Hyperlipidemia -Continue statin -Monitor with diuresis   Hyperkalemia -anticipate improvement with diuresis       Status is: Inpatient   Remains inpatient appropriate because:Inpatient level of care appropriate due to severity of illness   Dispo: The patient is from: Home              Anticipated d/c is to: Home              Patient currently is not medically stable to d/c.              Difficult to place patient No               Family Communication:   Spouse updated at bedside 9/25   Consultants:  none   Code Status:  FULL   DVT Prophylaxis:  [place] Heparin      Procedures: As Listed  in Progress Note Above   Antibiotics: None    Subjective: He is breathing better.  Patient denies fevers, chills, headache, chest pain, nausea, vomiting, diarrhea, abdominal pain, dysuria, hematuria, hematochezia, and melena.  Still has dyspnea on exertion and orthopnea   Objective: Vitals:   04/18/21 0500 04/18/21 0517 04/18/21 0831 04/18/21 1555  BP:  (!) 147/81 (!) 143/79 133/77  Pulse:  87 86 78  Resp:  16 16 20   Temp:  99.3 F (37.4 C) 99.4 F (37.4 C) 98.1 F (36.7 C)  TempSrc:  Oral Oral Oral  SpO2:  100% 99% 99%  Weight: 132.4 kg     Height:        Intake/Output Summary (Last 24 hours) at 04/18/2021 1751 Last  data filed at 04/18/2021 1557 Gross per 24 hour  Intake 363 ml  Output 775 ml  Net -412 ml   Weight change: 6.26 kg Exam:  General:  Pt is alert, follows commands appropriately, not in acute distress HEENT: No icterus, No thrush, No neck mass, [place]/AT Cardiovascular: RRR, S1/S2, no rubs, no gallops Respiratory: bibasilar crackles. No wheeze Abdomen: Soft/+BS, non tender, non distended, no guarding Extremities: 2 + LEedema, No lymphangitis, No petechiae, No rashes, no synovitis   Data Reviewed: I have personally reviewed following labs and imaging studies Basic Metabolic Panel: Recent Labs  Lab 04/16/21 0847 04/17/21 0547 04/18/21 0547  NA 140 143 142  K 5.5* 5.5* 5.3*  CL 111 113* 111  CO2 22 24 24   GLUCOSE 123* 99 105*  BUN 62* 63* 72*  CREATININE 3.36* 3.59* 4.46*  CALCIUM 8.1* 8.2* 8.0*   Liver Function Tests: No results for input(s): AST, ALT, ALKPHOS, BILITOT, PROT, ALBUMIN in the last 168 hours. No results for input(s): LIPASE, AMYLASE in the last 168 hours. No results for input(s): AMMONIA in the last 168 hours. Coagulation Profile: No results for input(s): INR, PROTIME in the last 168 hours. CBC: Recent Labs  Lab 04/16/21 0847  WBC 6.2  NEUTROABS 4.4  HGB 9.9*  HCT 31.4*  MCV 89.0  PLT 344   Cardiac Enzymes: No results for input(s): CKTOTAL, CKMB, CKMBINDEX, TROPONINI in the last 168 hours. BNP: Invalid input(s): POCBNP CBG: Recent Labs  Lab 04/17/21 1616 04/17/21 2131 04/18/21 0815 04/18/21 1145 04/18/21 1639  GLUCAP 158* 136* 95 172* 115*   HbA1C: Recent Labs    04/16/21 0847  HGBA1C 6.6*   Urine analysis:    Component Value Date/Time   COLORURINE YELLOW 06/14/2020 [place] 06/14/2020 1452   LABSPEC 1.010 06/14/2020 1452   PHURINE 5.0 06/14/2020 1452   GLUCOSEU NEGATIVE 06/14/2020 1452   HGBUR NEGATIVE 06/14/2020 1452   BILIRUBINUR NEGATIVE 06/14/2020 1452   [place] 06/14/2020 1452   PROTEINUR 100 (A)  06/14/2020 1452   NITRITE NEGATIVE 06/14/2020 1452   LEUKOCYTESUR NEGATIVE 06/14/2020 1452   Sepsis Labs: @LABRCNTIP (procalcitonin:4,lacticidven:4) ) Recent Results (from the past 240 hour(s))  Resp Panel by RT-PCR (Flu A&B, Covid) Nasopharyngeal Swab     Status: None   Collection Time: 04/16/21  8:50 AM   Specimen: Nasopharyngeal Swab; Nasopharyngeal(NP) swabs in vial transport medium  Result Value Ref Range Status   SARS Coronavirus 2 by RT PCR NEGATIVE NEGATIVE Final    Comment: (NOTE) SARS-CoV-2 target nucleic acids are NOT DETECTED.  The SARS-CoV-2 RNA is generally detectable in upper respiratory specimens during the acute phase of infection. The lowest concentration of SARS-CoV-2 viral copies this assay can detect is 138 copies/mL. A negative  result does not preclude SARS-Cov-2 infection and should not be used as the sole basis for treatment or other patient management decisions. A negative result may occur with  improper specimen collection/handling, submission of specimen other than nasopharyngeal swab, presence of viral mutation(s) within the areas targeted by this assay, and inadequate number of viral copies(<138 copies/mL). A negative result must be combined with clinical observations, patient history, and epidemiological information. The expected result is Negative.  Fact Sheet for Patients:  EntrepreneurPulse.com.au  Fact Sheet for Healthcare Providers:  IncredibleEmployment.be  This test is no t yet approved or cleared by the [place] FDA and  has been authorized for detection and/or diagnosis of SARS-CoV-2 by FDA under an Emergency Use Authorization (EUA). This EUA will remain  in effect (meaning this test can be used) for the duration of the COVID-19 declaration under Section 564(b)(1) of the Act, 21 U.S.C.section 360bbb-3(b)(1), unless the authorization is terminated  or revoked sooner.       Influenza A by PCR  NEGATIVE NEGATIVE Final   Influenza B by PCR NEGATIVE NEGATIVE Final    Comment: (NOTE) The Xpert Xpress SARS-CoV-2/FLU/RSV plus assay is intended as an aid in the diagnosis of influenza from Nasopharyngeal swab specimens and should not be used as a sole basis for treatment. Nasal washings and aspirates are unacceptable for Xpert Xpress SARS-CoV-2/FLU/RSV testing.  Fact Sheet for Patients: EntrepreneurPulse.com.au  Fact Sheet for Healthcare Providers: IncredibleEmployment.be  This test is not yet approved or cleared by the [place] FDA and has been authorized for detection and/or diagnosis of SARS-CoV-2 by FDA under an Emergency Use Authorization (EUA). This EUA will remain in effect (meaning this test can be used) for the duration of the COVID-19 declaration under Section 564(b)(1) of the Act, 21 U.S.C. section 360bbb-3(b)(1), unless the authorization is terminated or revoked.  Performed at [facility], [address], [place], [place] [address]      Scheduled Meds:  amLODipine  10 mg Oral Daily   heparin  5,000 Units Subcutaneous Q8H   hydrALAZINE  75 mg Oral TID   insulin aspart  0-9 Units Subcutaneous TID WC   labetalol  400 mg Oral BID   Living Better with Heart Failure Book   Does not apply Once   pravastatin  40 mg Oral q AM   sodium chloride flush  3 mL Intravenous Q12H   Continuous Infusions:  sodium chloride     furosemide      Procedures/Studies: DG Chest Port 1 View  Result Date: 04/16/2021 CLINICAL DATA:  55 year old male with shortness of breath for several days. EXAM: PORTABLE CHEST 1 VIEW COMPARISON:  Chest radiographs 09/30/2020 and earlier. FINDINGS: Portable AP upright view at 0838 hours. Unresolved right pleural effusion, now partially tracking into the minor fissure. Stable hypo ventilation at the right lung base. Mildly lower lung volumes overall. Mediastinal contours remain within normal limits. Stable left  lung. No pneumothorax. Perihilar pulmonary vascularity appears increased. Visualized tracheal air column is within normal limits. No acute osseous abnormality identified. Paucity of bowel gas in the upper abdomen. IMPRESSION: 1. Persistent moderate right pleural effusion since March. Stable associated right lung base hypoventilation. 2. Interval increased pulmonary vascularity suggesting acute pulmonary edema. Electronically Signed   By: Genevie Ann M.D.   On: 04/16/2021 08:49   ECHOCARDIOGRAM COMPLETE  Result Date: 04/17/2021    ECHOCARDIOGRAM REPORT   Patient Name:   [name] Date of Exam: 04/17/2021 Medical Rec #:  [id number]  Height:       71.0 in Accession #:    [id number]      Weight:       278.0 lb Date of Birth:  10-Apr-1966       BSA:          2.425 m Patient Age:    67 years        BP:           149/89 mmHg Patient Gender: M               HR:           86 bpm. Exam Location:  [facility] Procedure: 2D Echo, Cardiac Doppler and Color Doppler Indications:    CHF  History:        Patient has prior history of Echocardiogram examinations, most                 recent 04/06/2020. CHF, Signs/Symptoms:Shortness of Breath, Edema                 and CKD, Resp. distress; Risk Factors:Hypertension, Diabetes,                 Dyslipidemia and Morbid obesity.  Sonographer:    Dustin Flock RDCS Referring Phys: [phone] Tamey Wanek  Sonographer Comments: Patient is morbidly obese and Technically difficult study due to poor echo windows. IMPRESSIONS  1. Left ventricular ejection fraction, by estimation, is 55 to 60%. The left ventricle has normal function. The left ventricle has no regional wall motion abnormalities. There is moderate left ventricular hypertrophy. Left ventricular diastolic parameters are indeterminate.  2. Right ventricular systolic function is normal. The right ventricular size is normal. There is moderately elevated pulmonary artery systolic pressure.  3. Left atrial size was mildly dilated.  4. The  mitral valve is normal in structure. No evidence of mitral valve regurgitation. No evidence of mitral stenosis.  5. The tricuspid valve is abnormal.  6. The aortic valve is tricuspid. Aortic valve regurgitation is not visualized. No aortic stenosis is present.  7. The inferior vena cava is normal in size with greater than 50% respiratory variability, suggesting right atrial pressure of 3 mmHg. FINDINGS  Left Ventricle: Left ventricular ejection fraction, by estimation, is 55 to 60%. The left ventricle has normal function. The left ventricle has no regional wall motion abnormalities. The left ventricular internal cavity size was normal in size. There is  moderate left ventricular hypertrophy. Left ventricular diastolic parameters are indeterminate. Right Ventricle: The right ventricular size is normal. No increase in right ventricular wall thickness. Right ventricular systolic function is normal. There is moderately elevated pulmonary artery systolic pressure. The tricuspid regurgitant velocity is 3.40 m/s, and with an assumed right atrial pressure of 3 mmHg, the estimated right ventricular systolic pressure is 19.1 mmHg. Left Atrium: Left atrial size was mildly dilated. Right Atrium: Right atrial size was normal in size. Pericardium: There is no evidence of pericardial effusion. Mitral Valve: The mitral valve is normal in structure. No evidence of mitral valve regurgitation. No evidence of mitral valve stenosis. Tricuspid Valve: The tricuspid valve is abnormal. Tricuspid valve regurgitation is mild . No evidence of tricuspid stenosis. Aortic Valve: The aortic valve is tricuspid. Aortic valve regurgitation is not visualized. No aortic stenosis is present. Aortic valve mean gradient measures 3.9 mmHg. Aortic valve peak gradient measures 8.0 mmHg. Aortic valve area, by VTI measures 1.83 cm. Pulmonic Valve: The pulmonic valve was not well visualized. Pulmonic valve  regurgitation is mild. No evidence of pulmonic  stenosis. Aorta: The aortic root is normal in size and structure. Venous: The inferior vena cava is normal in size with greater than 50% respiratory variability, suggesting right atrial pressure of 3 mmHg. IAS/Shunts: No atrial level shunt detected by color flow Doppler.  LEFT VENTRICLE PLAX 2D LVIDd:         4.70 cm      Diastology LVIDs:         3.50 cm      LV e' medial:    9.01 cm/s LV PW:         1.50 cm      LV E/e' medial:  12.8 LV IVS:        1.40 cm      LV e' lateral:   11.00 cm/s LVOT diam:     2.10 cm      LV E/e' lateral: 10.5 LV SV:         54 LV SV Index:   22 LVOT Area:     3.46 cm  LV Volumes (MOD) LV vol d, MOD A4C: 187.0 ml LV vol s, MOD A4C: 68.3 ml LV SV MOD A4C:     187.0 ml RIGHT VENTRICLE RV Basal diam:  3.40 cm RV S prime:     13.50 cm/s TAPSE (M-mode): 3.0 cm LEFT ATRIUM           Index       RIGHT ATRIUM           Index LA diam:      3.90 cm 1.61 cm/m  RA Area:     16.50 cm LA Vol (A2C): 49.5 ml 20.41 ml/m RA Volume:   47.90 ml  19.75 ml/m LA Vol (A4C): 86.0 ml 35.46 ml/m  AORTIC VALVE AV Area (Vmax):    2.31 cm AV Area (Vmean):   2.32 cm AV Area (VTI):     1.83 cm AV Vmax:           141.17 cm/s AV Vmean:          90.030 cm/s AV VTI:            0.295 m AV Peak Grad:      8.0 mmHg AV Mean Grad:      3.9 mmHg LVOT Vmax:         94.20 cm/s LVOT Vmean:        60.400 cm/s LVOT VTI:          0.156 m LVOT/AV VTI ratio: 0.53  AORTA Ao Root diam: 2.80 cm MITRAL VALVE                TRICUSPID VALVE MV Area (PHT): 5.06 cm     TR Peak grad:   46.2 mmHg MV Decel Time: 150 msec     TR Vmax:        340.00 cm/s MV E velocity: 115.00 cm/s MV A velocity: 66.70 cm/s   SHUNTS MV E/A ratio:  1.72         Systemic VTI:  0.16 m                             Systemic Diam: 2.10 cm Carlyle Dolly MD Electronically signed by Carlyle Dolly MD Signature Date/Time: 04/17/2021/10:51:32 AM    Final     Orson Eva, DO  [facility]  If 7PM-7AM, please contact night-coverage [email or web address] Password  [facility] 04/18/2021,  5:51 PM   LOS: 2 days

## 2021-04-19 DIAGNOSIS — J9601 Acute respiratory failure with hypoxia: Secondary | ICD-10-CM | POA: Diagnosis not present

## 2021-04-19 DIAGNOSIS — I5033 Acute on chronic diastolic (congestive) heart failure: Secondary | ICD-10-CM | POA: Diagnosis not present

## 2021-04-19 DIAGNOSIS — N184 Chronic kidney disease, stage 4 (severe): Secondary | ICD-10-CM | POA: Diagnosis not present

## 2021-04-19 DIAGNOSIS — N179 Acute kidney failure, unspecified: Secondary | ICD-10-CM | POA: Diagnosis not present

## 2021-04-19 LAB — BASIC METABOLIC PANEL
Anion gap: 7 (ref 5–15)
BUN: 74 mg/dL — ABNORMAL HIGH (ref 6–20)
CO2: 24 mmol/L (ref 22–32)
Calcium: 8 mg/dL — ABNORMAL LOW (ref 8.9–10.3)
Chloride: 107 mmol/L (ref 98–111)
Creatinine, Ser: 4.99 mg/dL — ABNORMAL HIGH (ref 0.61–1.24)
GFR, Estimated: 13 mL/min — ABNORMAL LOW (ref >60)
Glucose, Bld: 108 mg/dL — ABNORMAL HIGH (ref 70–99)
Potassium: 5.2 mmol/L — ABNORMAL HIGH (ref 3.5–5.1)
Sodium: 138 mmol/L (ref 135–145)

## 2021-04-19 LAB — GLUCOSE, CAPILLARY
Glucose-Capillary: 110 mg/dL — ABNORMAL HIGH (ref 70–99)
Glucose-Capillary: 166 mg/dL — ABNORMAL HIGH (ref 70–99)
Glucose-Capillary: 138 mg/dL — ABNORMAL HIGH (ref 70–99)
Glucose-Capillary: 134 mg/dL — ABNORMAL HIGH (ref 70–99)

## 2021-04-19 MED ORDER — FUROSEMIDE 10 MG/ML IJ SOLN
120.0000 mg | Freq: Three times a day (TID) | INTRAVENOUS | Status: DC
  Administered 2021-04-19 – 2021-04-20 (×3): 120 mg via INTRAVENOUS
  Filled 2021-04-19 (×7): qty 12

## 2021-04-19 NOTE — Progress Notes (Signed)
Admit: 04/16/2021 LOS: 3  54M AoC HFpEF exacerbation with AoCKD4 while attempting ot diuresis, remains hypervolemic, hypoxic.    Subjective:  > 3L UOP yesterday, not correctly documented SCR up 4.5 to 4.99, K 5.2, HCO3 24 On Lasix 120 IV BID Still on 3L [place] Weights not improved, scale weight up 4lb? Remains with sig LEE Pt w/o complaints or questions   09/26 0701 - 09/27 0700 In: 1086.3 [P.O.:1080; I.V.:3; IV Piggyback:3.3] Out: 775 [Urine:775]  Filed Weights   04/16/21 0816 04/18/21 0500 04/19/21 0527  Weight: 126.1 kg 132.4 kg 133.9 kg    Scheduled Meds:  amLODipine  10 mg Oral Daily   heparin  5,000 Units Subcutaneous Q8H   hydrALAZINE  75 mg Oral TID   insulin aspart  0-9 Units Subcutaneous TID WC   labetalol  400 mg Oral BID   Living Better with Heart Failure Book   Does not apply Once   pravastatin  40 mg Oral q AM   sodium chloride flush  3 mL Intravenous Q12H   Continuous Infusions:  sodium chloride     furosemide 62 mL/hr at 04/18/21 1807   PRN Meds:.sodium chloride, acetaminophen, guaiFENesin-dextromethorphan, ondansetron (ZOFRAN) IV, sodium chloride flush  Current Labs: reviewed    Physical Exam:  Blood pressure (!) 150/83, pulse 86, temperature 98.2 F (36.8 C), temperature source Oral, resp. rate (!) 21, height 5\' 11"  (1.803 m), weight 133.9 kg, SpO2 99 %. GEN: NAD, obese, ENT: NCAT, [place] in place EYES: EOMI CV: distant heart sounds, nl s1s2, no rub PULM: diminished in bases, bronchial on R base, no crackles, nl wob ABD: obese, soft SKIN: no rashes/lesions EXT:3-4_+ LEE to the knees b/l, not improved today NEURO: nonfocal  A AoCKD4, baseline nephrotic CKD followed by [facility]; advancing and high chance of dialysis dependence in near/intermediate future AoC HFpEF, hypervolemic; AHF and [place] follow HTN DM2 Anemia, mild Hyperkalemia, mild, CTM  P Move lasix from BID to TID Cont to tolerate some worsening in GFR for improved volume status RRT  would be necessary if pt does not tolerate diuresis Daily weights, Daily Renal Panel, Strict I/Os, Avoid nephrotoxins (NSAIDs, judicious IV Contrast)  Medication Issues; Preferred narcotic agents for pain control are hydromorphone, fentanyl, and methadone. Morphine should not be used.  Baclofen should be avoided Avoid oral sodium phosphate and magnesium citrate based laxatives / bowel preps    Pearson Grippe MD 04/19/2021, 9:04 AM  Recent Labs  Lab 04/17/21 0547 04/18/21 0547 04/19/21 0542  NA 143 142 138  K 5.5* 5.3* 5.2*  CL 113* 111 107  CO2 24 24 24   GLUCOSE 99 105* 108*  BUN 63* 72* 74*  CREATININE 3.59* 4.46* 4.99*  CALCIUM 8.2* 8.0* 8.0*   Recent Labs  Lab 04/16/21 0847  WBC 6.2  NEUTROABS 4.4  HGB 9.9*  HCT 31.4*  MCV 89.0  PLT 344

## 2021-04-19 NOTE — Progress Notes (Signed)
PROGRESS NOTE  [name] VFI:[id number] DOB: May 15, 1966 DOA: 04/16/2021 PCP: [name], MD     Brief History:  55 y.o. male with medical history of diabetes mellitus type 2, CKD stage III, diastolic CHF, hyperlipidemia presenting with 2-week history of worsening shortness of breath.  He states that he has had exertional dyspnea for greater than 1 year at this time.  He denies any fevers, chills, headache, chest pain, coughing, hemoptysis, nausea, vomiting, diarrhea, abdominal pain.  He complains of worsening lower extremity edema and increasing abdominal girth.  He has orthopnea type symptoms.  He endorses compliance with all his medications.  However, the patient states that he drinks at least 3 x 20 ounce bottles of water daily.  He last saw Dr. Loralie Champagne on 12/29/2020 when his torsemide was increased to 80 mg twice daily.  In the office, his weight was 287 pounds.  He last followed up in the CHF clinic on 02/18/2021.  On that day, his weight was 282 pounds.  His torsemide was increased to 100 mg twice daily x2 days then back to 80 mg twice daily.  He was continued on hydralazine 75 mg twice daily, labetalol 400 mg twice daily and amlodipine 10 mg daily.   In the emergency department, the patient was afebrile hemodynamically stable.  Initial oxygen saturation was 74% on room air.  He was placed on 3 L with saturation up to 100%.  BMP showed a sodium 140, potassium 5.5, serum creatinine 3.36.  BNP was 461.  WBC 6.2, hemoglobin 9.9, platelets 344,000.  The patient was given 40 mg IV furosemide.  I I have ordered an additional 40 mg to be given IV.  Chest x-ray showed increasing pulmonary interstitial markings.   Assessment/Plan: Acute on chronic diastolic CHF -11/06/6061 echo EF 55%, no WMA, grade 2 DD, trivial MR -furosemide 80 mg IV twice daily>>increased to 120 mg TID -Fluid restrict -Accurate I's and O's -patient endorses indiscretion with fluids as well as missing doses  of his medications -9/25 standing weight 293 -9/27 standing weight 295 -04/17/21 Echo--EF 55-60%, no WMA, mild TR   Acute respiratory failure with hypoxia -due to pulmonary edema -74% on RA -stable on 3L -wean back to RA   Essential hypertension -Restart hydralazine, labetalol, amlodipine -Anticipate improvement with IV furosemide   Diabetes mellitus type 2 -01/18/2021 hemoglobin A1c 6.7 -04/16/21 A1C--6.6 -NovoLog sliding scale -Holding Amaryl   Acute on Chronic Renal Failure--CKD stage IV -His renal baseline has been widely variable secondary to his diuretic adjustments -Primarily baseline 2.7-3.0 -will need to tolerate worsen renal function for euvolemia -appreciate renal -discussed with Dr. Joelyn Oms   Hyperlipidemia -Continue statin -Monitor with diuresis   Hyperkalemia -anticipate improvement with diuresis       Status is: Inpatient   Remains inpatient appropriate because:Inpatient level of care appropriate due to severity of illness   Dispo: The patient is from: Home              Anticipated d/c is to: Home              Patient currently is not medically stable to d/c.              Difficult to place patient No               Family Communication:   Spouse updated at bedside 9/25   Consultants:  none   Code Status:  FULL  DVT Prophylaxis:  [place] Heparin      Procedures: As Listed in Progress Note Above   Antibiotics: None    Subjective: Breathing better, but has some dyspnea on exertion.  Denies cp, f/c, n/v/d,  Objective: Vitals:   04/19/21 0527 04/19/21 0913 04/19/21 1224 04/19/21 1805  BP: (!) 150/83 (!) 150/76 (!) 151/87 130/68  Pulse: 86  88 80  Resp: (!) 21  20   Temp: 98.2 F (36.8 C)  98.7 F (37.1 C)   TempSrc: Oral  Oral   SpO2: 99%  99%   Weight: 133.9 kg     Height:        Intake/Output Summary (Last 24 hours) at 04/19/2021 1859 Last data filed at 04/19/2021 1700 Gross per 24 hour  Intake 615.58 ml  Output 650 ml   Net -34.42 ml   Weight change: 1.497 kg Exam:  General:  Pt is alert, follows commands appropriately, not in acute distress HEENT: No icterus, No thrush, No neck mass, [place]/AT Cardiovascular: RRR, S1/S2, no rubs, no gallops Respiratory: bibasilar crackles. Abdomen: Soft/+BS, non tender, non distended, no guarding Extremities: 2+ LE edema, No lymphangitis, No petechiae, No rashes, no synovitis   Data Reviewed: I have personally reviewed following labs and imaging studies Basic Metabolic Panel: Recent Labs  Lab 04/16/21 0847 04/17/21 0547 04/18/21 0547 04/19/21 0542  NA 140 143 142 138  K 5.5* 5.5* 5.3* 5.2*  CL 111 113* 111 107  CO2 22 24 24 24   GLUCOSE 123* 99 105* 108*  BUN 62* 63* 72* 74*  CREATININE 3.36* 3.59* 4.46* 4.99*  CALCIUM 8.1* 8.2* 8.0* 8.0*   Liver Function Tests: No results for input(s): AST, ALT, ALKPHOS, BILITOT, PROT, ALBUMIN in the last 168 hours. No results for input(s): LIPASE, AMYLASE in the last 168 hours. No results for input(s): AMMONIA in the last 168 hours. Coagulation Profile: No results for input(s): INR, PROTIME in the last 168 hours. CBC: Recent Labs  Lab 04/16/21 0847  WBC 6.2  NEUTROABS 4.4  HGB 9.9*  HCT 31.4*  MCV 89.0  PLT 344   Cardiac Enzymes: No results for input(s): CKTOTAL, CKMB, CKMBINDEX, TROPONINI in the last 168 hours. BNP: Invalid input(s): POCBNP CBG: Recent Labs  Lab 04/18/21 1639 04/18/21 2106 04/19/21 0722 04/19/21 1134 04/19/21 1629  GLUCAP 115* 202* 110* 166* 138*   HbA1C: No results for input(s): HGBA1C in the last 72 hours. Urine analysis:    Component Value Date/Time   COLORURINE YELLOW 06/14/2020 [place] 06/14/2020 1452   LABSPEC 1.010 06/14/2020 1452   PHURINE 5.0 06/14/2020 1452   GLUCOSEU NEGATIVE 06/14/2020 1452   HGBUR NEGATIVE 06/14/2020 1452   BILIRUBINUR NEGATIVE 06/14/2020 1452   KETONESUR NEGATIVE 06/14/2020 1452   PROTEINUR 100 (A) 06/14/2020 1452   NITRITE  NEGATIVE 06/14/2020 1452   LEUKOCYTESUR NEGATIVE 06/14/2020 1452   Sepsis Labs: @LABRCNTIP (procalcitonin:4,lacticidven:4) ) Recent Results (from the past 240 hour(s))  Resp Panel by RT-PCR (Flu A&B, Covid) Nasopharyngeal Swab     Status: None   Collection Time: 04/16/21  8:50 AM   Specimen: Nasopharyngeal Swab; Nasopharyngeal(NP) swabs in vial transport medium  Result Value Ref Range Status   SARS Coronavirus 2 by RT PCR NEGATIVE NEGATIVE Final    Comment: (NOTE) SARS-CoV-2 target nucleic acids are NOT DETECTED.  The SARS-CoV-2 RNA is generally detectable in upper respiratory specimens during the acute phase of infection. The lowest concentration of SARS-CoV-2 viral copies this assay can detect is 138 copies/mL. A negative  result does not preclude SARS-Cov-2 infection and should not be used as the sole basis for treatment or other patient management decisions. A negative result may occur with  improper specimen collection/handling, submission of specimen other than nasopharyngeal swab, presence of viral mutation(s) within the areas targeted by this assay, and inadequate number of viral copies(<138 copies/mL). A negative result must be combined with clinical observations, patient history, and epidemiological information. The expected result is Negative.  Fact Sheet for Patients:  EntrepreneurPulse.com.au  Fact Sheet for Healthcare Providers:  IncredibleEmployment.be  This test is no t yet approved or cleared by the [place] FDA and  has been authorized for detection and/or diagnosis of SARS-CoV-2 by FDA under an Emergency Use Authorization (EUA). This EUA will remain  in effect (meaning this test can be used) for the duration of the COVID-19 declaration under Section 564(b)(1) of the Act, 21 U.S.C.section 360bbb-3(b)(1), unless the authorization is terminated  or revoked sooner.       Influenza A by PCR NEGATIVE NEGATIVE Final    Influenza B by PCR NEGATIVE NEGATIVE Final    Comment: (NOTE) The Xpert Xpress SARS-CoV-2/FLU/RSV plus assay is intended as an aid in the diagnosis of influenza from Nasopharyngeal swab specimens and should not be used as a sole basis for treatment. Nasal washings and aspirates are unacceptable for Xpert Xpress SARS-CoV-2/FLU/RSV testing.  Fact Sheet for Patients: EntrepreneurPulse.com.au  Fact Sheet for Healthcare Providers: IncredibleEmployment.be  This test is not yet approved or cleared by the [place] FDA and has been authorized for detection and/or diagnosis of SARS-CoV-2 by FDA under an Emergency Use Authorization (EUA). This EUA will remain in effect (meaning this test can be used) for the duration of the COVID-19 declaration under Section 564(b)(1) of the Act, 21 U.S.C. section 360bbb-3(b)(1), unless the authorization is terminated or revoked.  Performed at [facility], [address], [place], [place] [address]      Scheduled Meds:  amLODipine  10 mg Oral Daily   heparin  5,000 Units Subcutaneous Q8H   hydrALAZINE  75 mg Oral TID   insulin aspart  0-9 Units Subcutaneous TID WC   labetalol  400 mg Oral BID   Living Better with Heart Failure Book   Does not apply Once   pravastatin  40 mg Oral q AM   sodium chloride flush  3 mL Intravenous Q12H   Continuous Infusions:  sodium chloride     furosemide 62 mL/hr at 04/19/21 1513    Procedures/Studies: DG Chest Port 1 View  Result Date: 04/16/2021 CLINICAL DATA:  55 year old male with shortness of breath for several days. EXAM: PORTABLE CHEST 1 VIEW COMPARISON:  Chest radiographs 09/30/2020 and earlier. FINDINGS: Portable AP upright view at 0838 hours. Unresolved right pleural effusion, now partially tracking into the minor fissure. Stable hypo ventilation at the right lung base. Mildly lower lung volumes overall. Mediastinal contours remain within normal limits. Stable left lung.  No pneumothorax. Perihilar pulmonary vascularity appears increased. Visualized tracheal air column is within normal limits. No acute osseous abnormality identified. Paucity of bowel gas in the upper abdomen. IMPRESSION: 1. Persistent moderate right pleural effusion since March. Stable associated right lung base hypoventilation. 2. Interval increased pulmonary vascularity suggesting acute pulmonary edema. Electronically Signed   By: Genevie Ann M.D.   On: 04/16/2021 08:49   ECHOCARDIOGRAM COMPLETE  Result Date: 04/17/2021    ECHOCARDIOGRAM REPORT   Patient Name:   TALLAN SANDOZ Date of Exam: 04/17/2021 Medical Rec #:  [id number]  Height:       71.0 in Accession #:    [id number]      Weight:       278.0 lb Date of Birth:  06/16/1966       BSA:          2.425 m Patient Age:    56 years        BP:           149/89 mmHg Patient Gender: M               HR:           86 bpm. Exam Location:  [facility] Procedure: 2D Echo, Cardiac Doppler and Color Doppler Indications:    CHF  History:        Patient has prior history of Echocardiogram examinations, most                 recent 04/06/2020. CHF, Signs/Symptoms:Shortness of Breath, Edema                 and CKD, Resp. distress; Risk Factors:Hypertension, Diabetes,                 Dyslipidemia and Morbid obesity.  Sonographer:    Dustin Flock RDCS Referring Phys: [phone] Shanterica Biehler  Sonographer Comments: Patient is morbidly obese and Technically difficult study due to poor echo windows. IMPRESSIONS  1. Left ventricular ejection fraction, by estimation, is 55 to 60%. The left ventricle has normal function. The left ventricle has no regional wall motion abnormalities. There is moderate left ventricular hypertrophy. Left ventricular diastolic parameters are indeterminate.  2. Right ventricular systolic function is normal. The right ventricular size is normal. There is moderately elevated pulmonary artery systolic pressure.  3. Left atrial size was mildly dilated.  4. The  mitral valve is normal in structure. No evidence of mitral valve regurgitation. No evidence of mitral stenosis.  5. The tricuspid valve is abnormal.  6. The aortic valve is tricuspid. Aortic valve regurgitation is not visualized. No aortic stenosis is present.  7. The inferior vena cava is normal in size with greater than 50% respiratory variability, suggesting right atrial pressure of 3 mmHg. FINDINGS  Left Ventricle: Left ventricular ejection fraction, by estimation, is 55 to 60%. The left ventricle has normal function. The left ventricle has no regional wall motion abnormalities. The left ventricular internal cavity size was normal in size. There is  moderate left ventricular hypertrophy. Left ventricular diastolic parameters are indeterminate. Right Ventricle: The right ventricular size is normal. No increase in right ventricular wall thickness. Right ventricular systolic function is normal. There is moderately elevated pulmonary artery systolic pressure. The tricuspid regurgitant velocity is 3.40 m/s, and with an assumed right atrial pressure of 3 mmHg, the estimated right ventricular systolic pressure is 96.2 mmHg. Left Atrium: Left atrial size was mildly dilated. Right Atrium: Right atrial size was normal in size. Pericardium: There is no evidence of pericardial effusion. Mitral Valve: The mitral valve is normal in structure. No evidence of mitral valve regurgitation. No evidence of mitral valve stenosis. Tricuspid Valve: The tricuspid valve is abnormal. Tricuspid valve regurgitation is mild . No evidence of tricuspid stenosis. Aortic Valve: The aortic valve is tricuspid. Aortic valve regurgitation is not visualized. No aortic stenosis is present. Aortic valve mean gradient measures 3.9 mmHg. Aortic valve peak gradient measures 8.0 mmHg. Aortic valve area, by VTI measures 1.83 cm. Pulmonic Valve: The pulmonic valve was not well visualized. Pulmonic valve  regurgitation is mild. No evidence of pulmonic  stenosis. Aorta: The aortic root is normal in size and structure. Venous: The inferior vena cava is normal in size with greater than 50% respiratory variability, suggesting right atrial pressure of 3 mmHg. IAS/Shunts: No atrial level shunt detected by color flow Doppler.  LEFT VENTRICLE PLAX 2D LVIDd:         4.70 cm      Diastology LVIDs:         3.50 cm      LV e' medial:    9.01 cm/s LV PW:         1.50 cm      LV E/e' medial:  12.8 LV IVS:        1.40 cm      LV e' lateral:   11.00 cm/s LVOT diam:     2.10 cm      LV E/e' lateral: 10.5 LV SV:         54 LV SV Index:   22 LVOT Area:     3.46 cm  LV Volumes (MOD) LV vol d, MOD A4C: 187.0 ml LV vol s, MOD A4C: 68.3 ml LV SV MOD A4C:     187.0 ml RIGHT VENTRICLE RV Basal diam:  3.40 cm RV S prime:     13.50 cm/s TAPSE (M-mode): 3.0 cm LEFT ATRIUM           Index       RIGHT ATRIUM           Index LA diam:      3.90 cm 1.61 cm/m  RA Area:     16.50 cm LA Vol (A2C): 49.5 ml 20.41 ml/m RA Volume:   47.90 ml  19.75 ml/m LA Vol (A4C): 86.0 ml 35.46 ml/m  AORTIC VALVE AV Area (Vmax):    2.31 cm AV Area (Vmean):   2.32 cm AV Area (VTI):     1.83 cm AV Vmax:           141.17 cm/s AV Vmean:          90.030 cm/s AV VTI:            0.295 m AV Peak Grad:      8.0 mmHg AV Mean Grad:      3.9 mmHg LVOT Vmax:         94.20 cm/s LVOT Vmean:        60.400 cm/s LVOT VTI:          0.156 m LVOT/AV VTI ratio: 0.53  AORTA Ao Root diam: 2.80 cm MITRAL VALVE                TRICUSPID VALVE MV Area (PHT): 5.06 cm     TR Peak grad:   46.2 mmHg MV Decel Time: 150 msec     TR Vmax:        340.00 cm/s MV E velocity: 115.00 cm/s MV A velocity: 66.70 cm/s   SHUNTS MV E/A ratio:  1.72         Systemic VTI:  0.16 m                             Systemic Diam: 2.10 cm Carlyle Dolly MD Electronically signed by Carlyle Dolly MD Signature Date/Time: 04/17/2021/10:51:32 AM    Final     Orson Eva, DO  [facility]  If 7PM-7AM, please contact night-coverage [email or web address] Password  [facility] 04/19/2021,  6:59 PM   LOS: 3 days

## 2021-04-20 LAB — GLUCOSE, CAPILLARY
Glucose-Capillary: 119 mg/dL — ABNORMAL HIGH (ref 70–99)
Glucose-Capillary: 127 mg/dL — ABNORMAL HIGH (ref 70–99)
Glucose-Capillary: 120 mg/dL — ABNORMAL HIGH (ref 70–99)
Glucose-Capillary: 135 mg/dL — ABNORMAL HIGH (ref 70–99)

## 2021-04-20 LAB — BASIC METABOLIC PANEL
Anion gap: 7 (ref 5–15)
BUN: 75 mg/dL — ABNORMAL HIGH (ref 6–20)
CO2: 24 mmol/L (ref 22–32)
Calcium: 8.2 mg/dL — ABNORMAL LOW (ref 8.9–10.3)
Chloride: 106 mmol/L (ref 98–111)
Creatinine, Ser: 4.86 mg/dL — ABNORMAL HIGH (ref 0.61–1.24)
GFR, Estimated: 13 mL/min — ABNORMAL LOW (ref >60)
Glucose, Bld: 115 mg/dL — ABNORMAL HIGH (ref 70–99)
Potassium: 5.4 mmol/L — ABNORMAL HIGH (ref 3.5–5.1)
Sodium: 137 mmol/L (ref 135–145)

## 2021-04-20 MED ORDER — DARBEPOETIN ALFA 60 MCG/0.3ML IJ SOSY
60.0000 ug | PREFILLED_SYRINGE | Freq: Once | INTRAMUSCULAR | Status: AC
  Administered 2021-04-20: 60 ug via SUBCUTANEOUS
  Filled 2021-04-20: qty 0.3

## 2021-04-20 MED ORDER — METOLAZONE 5 MG PO TABS
5.0000 mg | ORAL_TABLET | Freq: Every day | ORAL | Status: DC
  Administered 2021-04-20 – 2021-04-23 (×4): 5 mg via ORAL
  Filled 2021-04-20 (×4): qty 1

## 2021-04-20 MED ORDER — FUROSEMIDE 10 MG/ML IJ SOLN
160.0000 mg | Freq: Three times a day (TID) | INTRAVENOUS | Status: DC
  Administered 2021-04-20 – 2021-04-23 (×9): 160 mg via INTRAVENOUS
  Filled 2021-04-20 (×16): qty 16

## 2021-04-20 MED ORDER — DOCUSATE SODIUM 100 MG PO CAPS
100.0000 mg | ORAL_CAPSULE | Freq: Two times a day (BID) | ORAL | Status: DC
  Administered 2021-04-20 – 2021-04-23 (×7): 100 mg via ORAL
  Filled 2021-04-20 (×7): qty 1

## 2021-04-20 NOTE — Progress Notes (Signed)
PROGRESS NOTE  [name] YKD:[id number] DOB: 06-07-1966 DOA: 04/16/2021 PCP: [name], MD     Brief History:  55 y.o. male with medical history of diabetes mellitus type 2, CKD stage III, diastolic CHF, hyperlipidemia presenting with 2-week history of worsening shortness of breath.  He states that he has had exertional dyspnea for greater than 1 year at this time.  He denies any fevers, chills, headache, chest pain, coughing, hemoptysis, nausea, vomiting, diarrhea, abdominal pain.  He complains of worsening lower extremity edema and increasing abdominal girth.  He has orthopnea type symptoms.  He endorses compliance with all his medications.  However, the patient states that he drinks at least 3 x 20 ounce bottles of water daily.  He last saw Dr. Loralie Champagne on 12/29/2020 when his torsemide was increased to 80 mg twice daily.  In the office, his weight was 287 pounds.  He last followed up in the CHF clinic on 02/18/2021.  On that day, his weight was 282 pounds.  His torsemide was increased to 100 mg twice daily x2 days then back to 80 mg twice daily.  He was continued on hydralazine 75 mg twice daily, labetalol 400 mg twice daily and amlodipine 10 mg daily.   In the emergency department, the patient was afebrile hemodynamically stable.  Initial oxygen saturation was 74% on room air.  He was placed on 3 L with saturation up to 100%.  BMP showed a sodium 140, potassium 5.5, serum creatinine 3.36.  BNP was 461.  WBC 6.2, hemoglobin 9.9, platelets 344,000.  The patient was given 40 mg IV furosemide.  I I have ordered an additional 40 mg to be given IV.  Chest x-ray showed increasing pulmonary interstitial markings.   Assessment/Plan: Acute on chronic diastolic CHF -3/97/6734 echo EF 55%, no WMA, grade 2 DD, trivial MR -Continue IV diuresis using Lasix 120 mg 3 times a day along with metolazone. -Encourage fluid restrict -Continue to follow accurate I's and O's -patient endorses  indiscretion with fluids as well as missing doses of his medications -9/25 standing weight 293 -9/27 standing weight 295 -04/17/21 Echo--EF 55-60%, no WMA, mild TR -Continue to follow nephrology service recommendation -Advised to be compliant with low-sodium diet.   Acute respiratory failure with hypoxia -due to pulmonary edema -74% on RA -Initially requiring 3 L nasal cannula supplementation; patient has been able to been weaned down to room air at rest currently.   Essential hypertension -Restart hydralazine, labetalol, amlodipine -Patient also receiving IV Lasix -Continue to follow vital signs.   Diabetes mellitus type 2 with nephropathy -01/18/2021 hemoglobin A1c 6.7 -04/16/21 A1C--6.6 -Continue NovoLog sliding scale -Continue holding Amaryl   Acute on Chronic Renal Failure--at baseline with CKD stage IV -His renal baseline has been widely variable; Primarily baseline 2.7-3.0 -will need to tolerate worsen renal function for euvolemia -appreciate renal service recommendations -Some improvement appreciated on patient's creatinine level after adjusted dose of diuresis. -Continue Lasix 120 mg 3 times a day along with metolazone. -Continue to check renal function on daily basis.   Hyperlipidemia -Continue statin -Advised to follow heart healthy diet.   Hyperkalemia -anticipate improvement with diuresis -Continue to follow electrolytes trend  Morbid obesity -Body mass index is 42.74 kg/m. -Low calorie diet, portion control and increase physical activity discussed with patient.      Status is: Inpatient   Remains inpatient appropriate because:Inpatient level of care appropriate due to severity of illness  Dispo: The patient is from: Home              Anticipated d/c is to: Home              Patient currently is not medically stable to d/c.              Difficult to place patient No       Family Communication:   No family at bedside; significant over approved for  updates by patient contacted over the phone.   Consultants: Nephrology service   Code Status:  FULL   DVT Prophylaxis:  [place] Heparin      Procedures: As Listed in Progress Note Above   Antibiotics: None    Subjective: Complaining of shortness of breath with exertion; still having orthopnea and fluid overload/anasarca on examination.  Objective: Vitals:   04/20/21 0600 04/20/21 0835 04/20/21 1506 04/20/21 1701  BP:  (!) 149/76 (!) 148/79 134/60  Pulse:  90 86   Resp:   18   Temp:   99.6 F (37.6 C)   TempSrc:   Oral   SpO2: 92%  (!) 86%   Weight:      Height:        Intake/Output Summary (Last 24 hours) at 04/20/2021 1848 Last data filed at 04/20/2021 1508 Gross per 24 hour  Intake 545.06 ml  Output 1300 ml  Net -754.94 ml   Weight change: 5.144 kg  Exam: General exam: Alert, awake, oriented x 3; no requiring oxygen supplementation at rest; expressing shortness of breath with minimal activity and complaining of orthopnea.  Patient with positive lower extremity edema and anasarca. Respiratory system: Fine crackles appreciated at the bases; no using accessory muscles. Cardiovascular system:RRR. No murmurs, rubs, gallops.  Unable to assess JVD with body habitus. Gastrointestinal system: Abdomen is obese, nondistended, soft and nontender. No organomegaly or masses felt. Normal bowel sounds heard. Central nervous system: Alert and oriented. No focal neurological deficits. Extremities: No cyanosis or clubbing; 2+ edema appreciated bilaterally. Skin: No rashes, no petechiae. Psychiatry: Judgement and insight appear normal. Mood & affect appropriate.    Data Reviewed: I have personally reviewed following labs and imaging studies  Basic Metabolic Panel: Recent Labs  Lab 04/16/21 0847 04/17/21 0547 04/18/21 0547 04/19/21 0542 04/20/21 0526  NA 140 143 142 138 137  K 5.5* 5.5* 5.3* 5.2* 5.4*  CL 111 113* 111 107 106  CO2 22 24 24 24 24   GLUCOSE 123* 99 105* 108*  115*  BUN 62* 63* 72* 74* 75*  CREATININE 3.36* 3.59* 4.46* 4.99* 4.86*  CALCIUM 8.1* 8.2* 8.0* 8.0* 8.2*   CBC: Recent Labs  Lab 04/16/21 0847  WBC 6.2  NEUTROABS 4.4  HGB 9.9*  HCT 31.4*  MCV 89.0  PLT 344    CBG: Recent Labs  Lab 04/19/21 1629 04/19/21 2034 04/20/21 0643 04/20/21 1122 04/20/21 1642  GLUCAP 138* 134* 119* 127* 120*    Urine analysis:    Component Value Date/Time   COLORURINE YELLOW 06/14/2020 [place] 06/14/2020 1452   LABSPEC 1.010 06/14/2020 1452   PHURINE 5.0 06/14/2020 1452   GLUCOSEU NEGATIVE 06/14/2020 1452   HGBUR NEGATIVE 06/14/2020 1452   BILIRUBINUR NEGATIVE 06/14/2020 1452   [place] 06/14/2020 1452   PROTEINUR 100 (A) 06/14/2020 1452   NITRITE NEGATIVE 06/14/2020 1452   LEUKOCYTESUR NEGATIVE 06/14/2020 1452   Sepsis Labs:  Recent Results (from the past 240 hour(s))  Resp Panel by RT-PCR (Flu A&B, Covid)  Nasopharyngeal Swab     Status: None   Collection Time: 04/16/21  8:50 AM   Specimen: Nasopharyngeal Swab; Nasopharyngeal(NP) swabs in vial transport medium  Result Value Ref Range Status   SARS Coronavirus 2 by RT PCR NEGATIVE NEGATIVE Final    Comment: (NOTE) SARS-CoV-2 target nucleic acids are NOT DETECTED.  The SARS-CoV-2 RNA is generally detectable in upper respiratory specimens during the acute phase of infection. The lowest concentration of SARS-CoV-2 viral copies this assay can detect is 138 copies/mL. A negative result does not preclude SARS-Cov-2 infection and should not be used as the sole basis for treatment or other patient management decisions. A negative result may occur with  improper specimen collection/handling, submission of specimen other than nasopharyngeal swab, presence of viral mutation(s) within the areas targeted by this assay, and inadequate number of viral copies(<138 copies/mL). A negative result must be combined with clinical observations, patient history, and  epidemiological information. The expected result is Negative.  Fact Sheet for Patients:  EntrepreneurPulse.com.au  Fact Sheet for Healthcare Providers:  IncredibleEmployment.be  This test is no t yet approved or cleared by the [place] FDA and  has been authorized for detection and/or diagnosis of SARS-CoV-2 by FDA under an Emergency Use Authorization (EUA). This EUA will remain  in effect (meaning this test can be used) for the duration of the COVID-19 declaration under Section 564(b)(1) of the Act, 21 U.S.C.section 360bbb-3(b)(1), unless the authorization is terminated  or revoked sooner.       Influenza A by PCR NEGATIVE NEGATIVE Final   Influenza B by PCR NEGATIVE NEGATIVE Final    Comment: (NOTE) The Xpert Xpress SARS-CoV-2/FLU/RSV plus assay is intended as an aid in the diagnosis of influenza from Nasopharyngeal swab specimens and should not be used as a sole basis for treatment. Nasal washings and aspirates are unacceptable for Xpert Xpress SARS-CoV-2/FLU/RSV testing.  Fact Sheet for Patients: EntrepreneurPulse.com.au  Fact Sheet for Healthcare Providers: IncredibleEmployment.be  This test is not yet approved or cleared by the [place] FDA and has been authorized for detection and/or diagnosis of SARS-CoV-2 by FDA under an Emergency Use Authorization (EUA). This EUA will remain in effect (meaning this test can be used) for the duration of the COVID-19 declaration under Section 564(b)(1) of the Act, 21 U.S.C. section 360bbb-3(b)(1), unless the authorization is terminated or revoked.  Performed at [facility], [address] [place], [place] [address]      Scheduled Meds:  amLODipine  10 mg Oral Daily   docusate sodium  100 mg Oral BID   heparin  5,000 Units Subcutaneous Q8H   hydrALAZINE  75 mg Oral TID   insulin aspart  0-9 Units Subcutaneous TID WC   labetalol  400 mg Oral BID    Living Better with Heart Failure Book   Does not apply Once   metolazone  5 mg Oral Daily   pravastatin  40 mg Oral q AM   sodium chloride flush  3 mL Intravenous Q12H   Continuous Infusions:  sodium chloride     furosemide 66 mL/hr at 04/20/21 1508    Procedures/Studies: DG Chest Port 1 View  Result Date: 04/16/2021 CLINICAL DATA:  55 year old male with shortness of breath for several days. EXAM: PORTABLE CHEST 1 VIEW COMPARISON:  Chest radiographs 09/30/2020 and earlier. FINDINGS: Portable AP upright view at 0838 hours. Unresolved right pleural effusion, now partially tracking into the minor fissure. Stable hypo ventilation at the right lung base. Mildly lower lung volumes overall.  Mediastinal contours remain within normal limits. Stable left lung. No pneumothorax. Perihilar pulmonary vascularity appears increased. Visualized tracheal air column is within normal limits. No acute osseous abnormality identified. Paucity of bowel gas in the upper abdomen. IMPRESSION: 1. Persistent moderate right pleural effusion since March. Stable associated right lung base hypoventilation. 2. Interval increased pulmonary vascularity suggesting acute pulmonary edema. Electronically Signed   By: Genevie Ann M.D.   On: 04/16/2021 08:49   ECHOCARDIOGRAM COMPLETE  Result Date: 04/17/2021    ECHOCARDIOGRAM REPORT   Patient Name:   JONHATAN HEARTY Date of Exam: 04/17/2021 Medical Rec #:  [id number]       Height:       71.0 in Accession #:    [id number]      Weight:       278.0 lb Date of Birth:  [birth year]       BSA:          2.425 m Patient Age:    76 years        BP:           149/89 mmHg Patient Gender: M               HR:           86 bpm. Exam Location:  [facility] Procedure: 2D Echo, Cardiac Doppler and Color Doppler Indications:    CHF  History:        Patient has prior history of Echocardiogram examinations, most                 recent 04/06/2020. CHF, Signs/Symptoms:Shortness of Breath, Edema                 and CKD,  Resp. distress; Risk Factors:Hypertension, Diabetes,                 Dyslipidemia and Morbid obesity.  Sonographer:    Dustin Flock RDCS Referring Phys: [id number] DAVID TAT  Sonographer Comments: Patient is morbidly obese and Technically difficult study due to poor echo windows. IMPRESSIONS  1. Left ventricular ejection fraction, by estimation, is 55 to 60%. The left ventricle has normal function. The left ventricle has no regional wall motion abnormalities. There is moderate left ventricular hypertrophy. Left ventricular diastolic parameters are indeterminate.  2. Right ventricular systolic function is normal. The right ventricular size is normal. There is moderately elevated pulmonary artery systolic pressure.  3. Left atrial size was mildly dilated.  4. The mitral valve is normal in structure. No evidence of mitral valve regurgitation. No evidence of mitral stenosis.  5. The tricuspid valve is abnormal.  6. The aortic valve is tricuspid. Aortic valve regurgitation is not visualized. No aortic stenosis is present.  7. The inferior vena cava is normal in size with greater than 50% respiratory variability, suggesting right atrial pressure of 3 mmHg. FINDINGS  Left Ventricle: Left ventricular ejection fraction, by estimation, is 55 to 60%. The left ventricle has normal function. The left ventricle has no regional wall motion abnormalities. The left ventricular internal cavity size was normal in size. There is  moderate left ventricular hypertrophy. Left ventricular diastolic parameters are indeterminate. Right Ventricle: The right ventricular size is normal. No increase in right ventricular wall thickness. Right ventricular systolic function is normal. There is moderately elevated pulmonary artery systolic pressure. The tricuspid regurgitant velocity is 3.40 m/s, and with an assumed right atrial pressure of 3 mmHg, the estimated right ventricular systolic pressure is 24.2 mmHg. Left Atrium: Left  atrial size was  mildly dilated. Right Atrium: Right atrial size was normal in size. Pericardium: There is no evidence of pericardial effusion. Mitral Valve: The mitral valve is normal in structure. No evidence of mitral valve regurgitation. No evidence of mitral valve stenosis. Tricuspid Valve: The tricuspid valve is abnormal. Tricuspid valve regurgitation is mild . No evidence of tricuspid stenosis. Aortic Valve: The aortic valve is tricuspid. Aortic valve regurgitation is not visualized. No aortic stenosis is present. Aortic valve mean gradient measures 3.9 mmHg. Aortic valve peak gradient measures 8.0 mmHg. Aortic valve area, by VTI measures 1.83 cm. Pulmonic Valve: The pulmonic valve was not well visualized. Pulmonic valve regurgitation is mild. No evidence of pulmonic stenosis. Aorta: The aortic root is normal in size and structure. Venous: The inferior vena cava is normal in size with greater than 50% respiratory variability, suggesting right atrial pressure of 3 mmHg. IAS/Shunts: No atrial level shunt detected by color flow Doppler.  LEFT VENTRICLE PLAX 2D LVIDd:         4.70 cm      Diastology LVIDs:         3.50 cm      LV e' medial:    9.01 cm/s LV PW:         1.50 cm      LV E/e' medial:  12.8 LV IVS:        1.40 cm      LV e' lateral:   11.00 cm/s LVOT diam:     2.10 cm      LV E/e' lateral: 10.5 LV SV:         54 LV SV Index:   22 LVOT Area:     3.46 cm  LV Volumes (MOD) LV vol d, MOD A4C: 187.0 ml LV vol s, MOD A4C: 68.3 ml LV SV MOD A4C:     187.0 ml RIGHT VENTRICLE RV Basal diam:  3.40 cm RV S prime:     13.50 cm/s TAPSE (M-mode): 3.0 cm LEFT ATRIUM           Index       RIGHT ATRIUM           Index LA diam:      3.90 cm 1.61 cm/m  RA Area:     16.50 cm LA Vol (A2C): 49.5 ml 20.41 ml/m RA Volume:   47.90 ml  19.75 ml/m LA Vol (A4C): 86.0 ml 35.46 ml/m  AORTIC VALVE AV Area (Vmax):    2.31 cm AV Area (Vmean):   2.32 cm AV Area (VTI):     1.83 cm AV Vmax:           141.17 cm/s AV Vmean:          90.030 cm/s  AV VTI:            0.295 m AV Peak Grad:      8.0 mmHg AV Mean Grad:      3.9 mmHg LVOT Vmax:         94.20 cm/s LVOT Vmean:        60.400 cm/s LVOT VTI:          0.156 m LVOT/AV VTI ratio: 0.53  AORTA Ao Root diam: 2.80 cm MITRAL VALVE                TRICUSPID VALVE MV Area (PHT): 5.06 cm     TR Peak grad:   46.2 mmHg MV Decel Time: 150 msec  TR Vmax:        340.00 cm/s MV E velocity: 115.00 cm/s MV A velocity: 66.70 cm/s   SHUNTS MV E/A ratio:  1.72         Systemic VTI:  0.16 m                             Systemic Diam: 2.10 cm Carlyle Dolly MD Electronically signed by Carlyle Dolly MD Signature Date/Time: 04/17/2021/10:51:32 AM    Final     Barton Dubois, MD  [facility]  If 7PM-7AM, please contact night-coverage [email or web address] Password [facility] 04/20/2021, 6:48 PM   LOS: 4 days

## 2021-04-20 NOTE — Plan of Care (Signed)
Pt alert and oriented x 4. Med compliant. Denies pain. Pt continues to have 3+ edema to BLE. Lasix IV every 8 hours continues. Vitals stable no tele events this shift.  Problem: Education: Goal: Knowledge of General Education information will improve Description: Including pain rating scale, medication(s)/side effects and non-pharmacologic comfort measures Outcome: Progressing   Problem: Health Behavior/Discharge Planning: Goal: Ability to manage health-related needs will improve Outcome: Progressing   Problem: Clinical Measurements: Goal: Ability to maintain clinical measurements within normal limits will improve Outcome: Progressing Goal: Cardiovascular complication will be avoided Outcome: Progressing   Problem: Activity: Goal: Risk for activity intolerance will decrease Outcome: Progressing   Problem: Nutrition: Goal: Adequate nutrition will be maintained Outcome: Progressing   Problem: Elimination: Goal: Will not experience complications related to urinary retention Outcome: Progressing   Problem: Pain Managment: Goal: General experience of comfort will improve Outcome: Progressing   Problem: Safety: Goal: Ability to remain free from injury will improve Outcome: Progressing   Problem: Skin Integrity: Goal: Risk for impaired skin integrity will decrease Outcome: Progressing

## 2021-04-20 NOTE — Progress Notes (Signed)
Subjective:  1950 UOP recorded-  total 1300 negative-  BUN and crt about the same-  he is nauseated this AM and was yesterday as well-  cannot eat breakfast   Objective Vital signs in last 24 hours: Vitals:   04/20/21 0448 04/20/21 0500 04/20/21 0600 04/20/21 0835  BP: (!) 153/85   (!) 149/76  Pulse: 85   90  Resp: 15     Temp: 99 F (37.2 C)     TempSrc: Oral     SpO2: 91%  92%   Weight:  (!) 139 kg    Height:       Weight change: 5.144 kg  Intake/Output Summary (Last 24 hours) at 04/20/2021 8295 Last data filed at 04/20/2021 6213 Gross per 24 hour  Intake 415.58 ml  Output 1950 ml  Net -1534.42 ml    Assessment/ Plan: Pt is a 55 y.o. yo male with stage 4 CKD-  crt in the low 3's thought due to DM/CHF who was admitted on 04/16/2021 with volume overload  Assessment/Plan: 1. Renal-  worsening of already impaired renal function in the setting of volume overload and diuresis.  Maybe trying to stabilize the last 24 hours.  No absolute indications for HD but the nausea is likely related to uremia and I have told him that.  He is obviously hoping to avoid dialysis and has some feelings  "family members who went on dialysis who are not with [place] anymore"  Will continue to take one day at a time.  He asks about transplant-  I am not sure how impaired his heart is but would certainly need to lose weight to be considered for something like that  2. HTN/volume-  still overloaded -  weights are not reliable-  was over a liter out supposedly last 24 hours.  Will intensify his diuretic regimen - add metolozone 3. Anemia-  check iron stores and give ESA  4. Secondary hyperparathyroidism-  last PTH 2 digits but that was a while ago 5. Hyperkalemia-  stable-  I anticipate will decrease in the setting of lasix and metolazone   Louis Meckel    Labs: Basic Metabolic Panel: Recent Labs  Lab 04/18/21 0547 04/19/21 0542 04/20/21 0526  NA 142 138 137  K 5.3* 5.2* 5.4*  CL 111 107 106   CO2 24 24 24   GLUCOSE 105* 108* 115*  BUN 72* 74* 75*  CREATININE 4.46* 4.99* 4.86*  CALCIUM 8.0* 8.0* 8.2*   Liver Function Tests: No results for input(s): AST, ALT, ALKPHOS, BILITOT, PROT, ALBUMIN in the last 168 hours. No results for input(s): LIPASE, AMYLASE in the last 168 hours. No results for input(s): AMMONIA in the last 168 hours. CBC: Recent Labs  Lab 04/16/21 0847  WBC 6.2  NEUTROABS 4.4  HGB 9.9*  HCT 31.4*  MCV 89.0  PLT 344   Cardiac Enzymes: No results for input(s): CKTOTAL, CKMB, CKMBINDEX, TROPONINI in the last 168 hours. CBG: Recent Labs  Lab 04/18/21 2106 04/19/21 0722 04/19/21 1134 04/19/21 1629 04/19/21 2034  GLUCAP 202* 110* 166* 138* 134*    Iron Studies: No results for input(s): IRON, TIBC, TRANSFERRIN, FERRITIN in the last 72 hours. Studies/Results: No results found. Medications: Infusions:  sodium chloride     furosemide 120 mg (04/20/21 0865)    Scheduled Medications:  amLODipine  10 mg Oral Daily   docusate sodium  100 mg Oral BID   heparin  5,000 Units Subcutaneous Q8H   hydrALAZINE  75 mg Oral  TID   insulin aspart  0-9 Units Subcutaneous TID WC   labetalol  400 mg Oral BID   Living Better with Heart Failure Book   Does not apply Once   pravastatin  40 mg Oral q AM   sodium chloride flush  3 mL Intravenous Q12H    have reviewed scheduled and prn medications.  Physical Exam: General: alert, nad- obese Heart: RRR Lungs:  dec BS at bases Abdomen: obese, soft , non tender Extremities: pitting edema    04/20/2021,9:24 AM  LOS: 4 days

## 2021-04-21 LAB — BASIC METABOLIC PANEL
Anion gap: 9 (ref 5–15)
BUN: 76 mg/dL — ABNORMAL HIGH (ref 6–20)
CO2: 25 mmol/L (ref 22–32)
Calcium: 8.3 mg/dL — ABNORMAL LOW (ref 8.9–10.3)
Chloride: 102 mmol/L (ref 98–111)
Creatinine, Ser: 4.96 mg/dL — ABNORMAL HIGH (ref 0.61–1.24)
GFR, Estimated: 13 mL/min — ABNORMAL LOW (ref >60)
Glucose, Bld: 116 mg/dL — ABNORMAL HIGH (ref 70–99)
Potassium: 5.2 mmol/L — ABNORMAL HIGH (ref 3.5–5.1)
Sodium: 136 mmol/L (ref 135–145)

## 2021-04-21 LAB — FERRITIN: Ferritin: 127 ng/mL (ref 24–336)

## 2021-04-21 LAB — GLUCOSE, CAPILLARY
Glucose-Capillary: 113 mg/dL — ABNORMAL HIGH (ref 70–99)
Glucose-Capillary: 190 mg/dL — ABNORMAL HIGH (ref 70–99)
Glucose-Capillary: 134 mg/dL — ABNORMAL HIGH (ref 70–99)
Glucose-Capillary: 139 mg/dL — ABNORMAL HIGH (ref 70–99)

## 2021-04-21 LAB — IRON AND TIBC
Iron: 84 ug/dL (ref 45–182)
Saturation Ratios: 33 % (ref 17.9–39.5)
TIBC: 256 ug/dL (ref 250–450)
UIBC: 172 ug/dL

## 2021-04-21 MED ORDER — FUROSEMIDE 10 MG/ML IJ SOLN
INTRAMUSCULAR | Status: AC
  Filled 2021-04-21: qty 40

## 2021-04-21 NOTE — Progress Notes (Signed)
PROGRESS NOTE  GUST EUGENE XBL:[id number] DOB: 02-04-1966 DOA: 04/16/2021 PCP: [name], MD     Brief History:  55 y.o. male with medical history of diabetes mellitus type 2, CKD stage III, diastolic CHF, hyperlipidemia presenting with 2-week history of worsening shortness of breath.  He states that he has had exertional dyspnea for greater than 1 year at this time.  He denies any fevers, chills, headache, chest pain, coughing, hemoptysis, nausea, vomiting, diarrhea, abdominal pain.  He complains of worsening lower extremity edema and increasing abdominal girth.  He has orthopnea type symptoms.  He endorses compliance with all his medications.  However, the patient states that he drinks at least 3 x 20 ounce bottles of water daily.  He last saw Dr. Loralie Champagne on 12/29/2020 when his torsemide was increased to 80 mg twice daily.  In the office, his weight was 287 pounds.  He last followed up in the CHF clinic on 02/18/2021.  On that day, his weight was 282 pounds.  His torsemide was increased to 100 mg twice daily x2 days then back to 80 mg twice daily.  He was continued on hydralazine 75 mg twice daily, labetalol 400 mg twice daily and amlodipine 10 mg daily.   In the emergency department, the patient was afebrile hemodynamically stable.  Initial oxygen saturation was 74% on room air.  He was placed on 3 L with saturation up to 100%.  BMP showed a sodium 140, potassium 5.5, serum creatinine 3.36.  BNP was 461.  WBC 6.2, hemoglobin 9.9, platelets 344,000.  The patient was given 40 mg IV furosemide.  I I have ordered an additional 40 mg to be given IV.  Chest x-ray showed increasing pulmonary interstitial markings.   Assessment/Plan: Acute on chronic diastolic CHF -3/00/7622 echo EF 55%, no WMA, grade 2 DD, trivial MR -Continue current IV diuresis using Lasix 120 mg 3 times a day along with metolazone. -Encourage fluid restrict -Continue to follow accurate I's and O's -patient  endorses indiscretion with fluids as well as missing doses of his medications -04/17/21 Echo--EF 55-60%, no WMA, mild TR -Continue to follow nephrology service recommendation -Advised to be compliant with low-sodium diet. -Patient roughly 2 L negative in the last 36 hours.  Weight 131.6 kg today   Acute respiratory failure with hypoxia -due to pulmonary edema -74% on RA at time of admission -Initially requiring 3 L nasal cannula supplementation; good oxygen saturation on room air currently.   Essential hypertension -Restart hydralazine, labetalol, amlodipine -Patient also receiving IV Lasix -Continue to follow vital signs.   Diabetes mellitus type 2 with nephropathy -01/18/2021 hemoglobin A1c 6.7 -04/16/21 A1C--6.6 -Continue NovoLog sliding scale -Continue holding Amaryl   Acute on Chronic Renal Failure--at baseline with CKD stage IV -His renal baseline has been widely variable; Primarily baseline 2.7-3.0 -will need to tolerate worsen renal function for euvolemia -appreciate renal service recommendations -Some improvement appreciated on patient's creatinine level after adjusted dose of diuresis. -Continue Lasix 120 mg 3 times a day along with metolazone. -Continue to check renal function on daily basis.   Hyperlipidemia -Continue statin -Advised to follow heart healthy diet.   Hyperkalemia -anticipate improvement with diuresis -Continue to follow electrolytes trend  Morbid obesity -Body mass index is 40.47 kg/m. -Low calorie diet, portion control and increase physical activity discussed with patient.      Status is: Inpatient   Remains inpatient appropriate because:Inpatient level of care appropriate due to  severity of illness   Dispo: The patient is from: Home              Anticipated d/c is to: Home              Patient currently is not medically stable to d/c.              Difficult to place patient No       Family Communication:   No family at bedside;  significant over approved for updates by patient contacted over the phone.   Consultants: Nephrology service   Code Status:  FULL   DVT Prophylaxis:  Fraser Heparin      Procedures: As Listed in Progress Note Above   Antibiotics: None    Subjective: No chest pain, no nausea, no vomiting, no palpitations.  Still demonstrating orthopnea and fluid overload on physical exam.   Objective: Vitals:   04/21/21 0614 04/21/21 0627 04/21/21 0834 04/21/21 1700  BP: (!) 163/87  (!) 161/88 120/60  Pulse: 88  85 83  Resp: 17  16 16   Temp: 98.9 F (37.2 C)  98.7 F (37.1 C) 98.1 F (36.7 C)  TempSrc: Oral  Oral Oral  SpO2: 91%  93% 94%  Weight:  131.6 kg    Height:        Intake/Output Summary (Last 24 hours) at 04/21/2021 1754 Last data filed at 04/21/2021 1715 Gross per 24 hour  Intake 546.92 ml  Output 3100 ml  Net -2553.08 ml   Weight change: -7.366 kg  Exam: General exam: Alert, awake, oriented x 3; no nausea, no vomiting, no chest pain.  Reports feeling breathing is better today.  Still with anasarca and complains of orthopnea. Respiratory system: Decreased breath sounds at the bases, no wheezing, no using accessory muscles.  No requiring oxygen supplementation. Cardiovascular system:RRR. No murmurs, rubs, gallops.  Unable to assess JVD with body habitus. Gastrointestinal system: Abdomen is obese, nondistended, soft and nontender. No organomegaly or masses felt. Normal bowel sounds heard. Central nervous system: Alert and oriented. No focal neurological deficits. Extremities: No cyanosis or clubbing; 2+ edema appreciated bilaterally. Skin: No petechiae. Psychiatry: Judgement and insight appear normal. Mood & affect appropriate.   Data Reviewed: I have personally reviewed following labs and imaging studies  Basic Metabolic Panel: Recent Labs  Lab 04/17/21 0547 04/18/21 0547 04/19/21 0542 04/20/21 0526 04/21/21 0536  NA 143 142 138 137 136  K 5.5* 5.3* 5.2* 5.4* 5.2*   CL 113* 111 107 106 102  CO2 24 24 24 24 25   GLUCOSE 99 105* 108* 115* 116*  BUN 63* 72* 74* 75* 76*  CREATININE 3.59* 4.46* 4.99* 4.86* 4.96*  CALCIUM 8.2* 8.0* 8.0* 8.2* 8.3*   CBC: Recent Labs  Lab 04/16/21 0847  WBC 6.2  NEUTROABS 4.4  HGB 9.9*  HCT 31.4*  MCV 89.0  PLT 344    CBG: Recent Labs  Lab 04/20/21 1642 04/20/21 2128 04/21/21 0801 04/21/21 1136 04/21/21 1710  GLUCAP 120* 135* 113* 190* 134*    Urine analysis:    Component Value Date/Time   COLORURINE YELLOW 06/14/2020 Andrew 06/14/2020 1452   LABSPEC 1.010 06/14/2020 1452   PHURINE 5.0 06/14/2020 1452   GLUCOSEU NEGATIVE 06/14/2020 1452   HGBUR NEGATIVE 06/14/2020 1452   BILIRUBINUR NEGATIVE 06/14/2020 1452   [place] 06/14/2020 1452   PROTEINUR 100 (A) 06/14/2020 1452   NITRITE NEGATIVE 06/14/2020 1452   LEUKOCYTESUR NEGATIVE 06/14/2020 1452   Sepsis Labs:  Recent Results (from the past 240 hour(s))  Resp Panel by RT-PCR (Flu A&B, Covid) Nasopharyngeal Swab     Status: None   Collection Time: 04/16/21  8:50 AM   Specimen: Nasopharyngeal Swab; Nasopharyngeal(NP) swabs in vial transport medium  Result Value Ref Range Status   SARS Coronavirus 2 by RT PCR NEGATIVE NEGATIVE Final    Comment: (NOTE) SARS-CoV-2 target nucleic acids are NOT DETECTED.  The SARS-CoV-2 RNA is generally detectable in upper respiratory specimens during the acute phase of infection. The lowest concentration of SARS-CoV-2 viral copies this assay can detect is 138 copies/mL. A negative result does not preclude SARS-Cov-2 infection and should not be used as the sole basis for treatment or other patient management decisions. A negative result may occur with  improper specimen collection/handling, submission of specimen other than nasopharyngeal swab, presence of viral mutation(s) within the areas targeted by this assay, and inadequate number of viral copies(<138 copies/mL). A negative result  must be combined with clinical observations, patient history, and epidemiological information. The expected result is Negative.  Fact Sheet for Patients:  EntrepreneurPulse.com.au  Fact Sheet for Healthcare Providers:  IncredibleEmployment.be  This test is no t yet approved or cleared by the [place] FDA and  has been authorized for detection and/or diagnosis of SARS-CoV-2 by FDA under an Emergency Use Authorization (EUA). This EUA will remain  in effect (meaning this test can be used) for the duration of the COVID-19 declaration under Section 564(b)(1) of the Act, 21 U.S.C.section 360bbb-3(b)(1), unless the authorization is terminated  or revoked sooner.       Influenza A by PCR NEGATIVE NEGATIVE Final   Influenza B by PCR NEGATIVE NEGATIVE Final    Comment: (NOTE) The Xpert Xpress SARS-CoV-2/FLU/RSV plus assay is intended as an aid in the diagnosis of influenza from Nasopharyngeal swab specimens and should not be used as a sole basis for treatment. Nasal washings and aspirates are unacceptable for Xpert Xpress SARS-CoV-2/FLU/RSV testing.  Fact Sheet for Patients: EntrepreneurPulse.com.au  Fact Sheet for Healthcare Providers: IncredibleEmployment.be  This test is not yet approved or cleared by the [place] FDA and has been authorized for detection and/or diagnosis of SARS-CoV-2 by FDA under an Emergency Use Authorization (EUA). This EUA will remain in effect (meaning this test can be used) for the duration of the COVID-19 declaration under Section 564(b)(1) of the Act, 21 U.S.C. section 360bbb-3(b)(1), unless the authorization is terminated or revoked.  Performed at [facility], [address], [place], [place] [address]      Scheduled Meds:  amLODipine  10 mg Oral Daily   docusate sodium  100 mg Oral BID   heparin  5,000 Units Subcutaneous Q8H   hydrALAZINE  75 mg Oral TID   insulin  aspart  0-9 Units Subcutaneous TID WC   labetalol  400 mg Oral BID   Living Better with Heart Failure Book   Does not apply Once   metolazone  5 mg Oral Daily   pravastatin  40 mg Oral q AM   sodium chloride flush  3 mL Intravenous Q12H   Continuous Infusions:  sodium chloride     furosemide 160 mg (04/21/21 1438)    Procedures/Studies: DG Chest Port 1 View  Result Date: 04/16/2021 CLINICAL DATA:  55 year old male with shortness of breath for several days. EXAM: PORTABLE CHEST 1 VIEW COMPARISON:  Chest radiographs 09/30/2020 and earlier. FINDINGS: Portable AP upright view at 0838 hours. Unresolved right pleural effusion, now partially tracking into the minor  fissure. Stable hypo ventilation at the right lung base. Mildly lower lung volumes overall. Mediastinal contours remain within normal limits. Stable left lung. No pneumothorax. Perihilar pulmonary vascularity appears increased. Visualized tracheal air column is within normal limits. No acute osseous abnormality identified. Paucity of bowel gas in the upper abdomen. IMPRESSION: 1. Persistent moderate right pleural effusion since March. Stable associated right lung base hypoventilation. 2. Interval increased pulmonary vascularity suggesting acute pulmonary edema. Electronically Signed   By: Genevie Ann M.D.   On: 04/16/2021 08:49   ECHOCARDIOGRAM COMPLETE  Result Date: 04/17/2021    ECHOCARDIOGRAM REPORT   Patient Name:   [name] Date of Exam: 04/17/2021 Medical Rec #:  [id number]       Height:       71.0 in Accession #:    [id number]      Weight:       278.0 lb Date of Birth:  May 05, 1966       BSA:          2.425 m Patient Age:    68 years        BP:           149/89 mmHg Patient Gender: M               HR:           86 bpm. Exam Location:  [facility] Procedure: 2D Echo, Cardiac Doppler and Color Doppler Indications:    CHF  History:        Patient has prior history of Echocardiogram examinations, most                 recent 04/06/2020. CHF,  Signs/Symptoms:Shortness of Breath, Edema                 and CKD, Resp. distress; Risk Factors:Hypertension, Diabetes,                 Dyslipidemia and Morbid obesity.  Sonographer:    Dustin Flock RDCS Referring Phys: [phone] DAVID TAT  Sonographer Comments: Patient is morbidly obese and Technically difficult study due to poor echo windows. IMPRESSIONS  1. Left ventricular ejection fraction, by estimation, is 55 to 60%. The left ventricle has normal function. The left ventricle has no regional wall motion abnormalities. There is moderate left ventricular hypertrophy. Left ventricular diastolic parameters are indeterminate.  2. Right ventricular systolic function is normal. The right ventricular size is normal. There is moderately elevated pulmonary artery systolic pressure.  3. Left atrial size was mildly dilated.  4. The mitral valve is normal in structure. No evidence of mitral valve regurgitation. No evidence of mitral stenosis.  5. The tricuspid valve is abnormal.  6. The aortic valve is tricuspid. Aortic valve regurgitation is not visualized. No aortic stenosis is present.  7. The inferior vena cava is normal in size with greater than 50% respiratory variability, suggesting right atrial pressure of 3 mmHg. FINDINGS  Left Ventricle: Left ventricular ejection fraction, by estimation, is 55 to 60%. The left ventricle has normal function. The left ventricle has no regional wall motion abnormalities. The left ventricular internal cavity size was normal in size. There is  moderate left ventricular hypertrophy. Left ventricular diastolic parameters are indeterminate. Right Ventricle: The right ventricular size is normal. No increase in right ventricular wall thickness. Right ventricular systolic function is normal. There is moderately elevated pulmonary artery systolic pressure. The tricuspid regurgitant velocity is 3.40 m/s, and with an assumed right atrial pressure of  3 mmHg, the estimated right ventricular  systolic pressure is 02.4 mmHg. Left Atrium: Left atrial size was mildly dilated. Right Atrium: Right atrial size was normal in size. Pericardium: There is no evidence of pericardial effusion. Mitral Valve: The mitral valve is normal in structure. No evidence of mitral valve regurgitation. No evidence of mitral valve stenosis. Tricuspid Valve: The tricuspid valve is abnormal. Tricuspid valve regurgitation is mild . No evidence of tricuspid stenosis. Aortic Valve: The aortic valve is tricuspid. Aortic valve regurgitation is not visualized. No aortic stenosis is present. Aortic valve mean gradient measures 3.9 mmHg. Aortic valve peak gradient measures 8.0 mmHg. Aortic valve area, by VTI measures 1.83 cm. Pulmonic Valve: The pulmonic valve was not well visualized. Pulmonic valve regurgitation is mild. No evidence of pulmonic stenosis. Aorta: The aortic root is normal in size and structure. Venous: The inferior vena cava is normal in size with greater than 50% respiratory variability, suggesting right atrial pressure of 3 mmHg. IAS/Shunts: No atrial level shunt detected by color flow Doppler.  LEFT VENTRICLE PLAX 2D LVIDd:         4.70 cm      Diastology LVIDs:         3.50 cm      LV e' medial:    9.01 cm/s LV PW:         1.50 cm      LV E/e' medial:  12.8 LV IVS:        1.40 cm      LV e' lateral:   11.00 cm/s LVOT diam:     2.10 cm      LV E/e' lateral: 10.5 LV SV:         54 LV SV Index:   22 LVOT Area:     3.46 cm  LV Volumes (MOD) LV vol d, MOD A4C: 187.0 ml LV vol s, MOD A4C: 68.3 ml LV SV MOD A4C:     187.0 ml RIGHT VENTRICLE RV Basal diam:  3.40 cm RV S prime:     13.50 cm/s TAPSE (M-mode): 3.0 cm LEFT ATRIUM           Index       RIGHT ATRIUM           Index LA diam:      3.90 cm 1.61 cm/m  RA Area:     16.50 cm LA Vol (A2C): 49.5 ml 20.41 ml/m RA Volume:   47.90 ml  19.75 ml/m LA Vol (A4C): 86.0 ml 35.46 ml/m  AORTIC VALVE AV Area (Vmax):    2.31 cm AV Area (Vmean):   2.32 cm AV Area (VTI):     1.83  cm AV Vmax:           141.17 cm/s AV Vmean:          90.030 cm/s AV VTI:            0.295 m AV Peak Grad:      8.0 mmHg AV Mean Grad:      3.9 mmHg LVOT Vmax:         94.20 cm/s LVOT Vmean:        60.400 cm/s LVOT VTI:          0.156 m LVOT/AV VTI ratio: 0.53  AORTA Ao Root diam: 2.80 cm MITRAL VALVE                TRICUSPID VALVE MV Area (PHT): 5.06 cm     TR  Peak grad:   46.2 mmHg MV Decel Time: 150 msec     TR Vmax:        340.00 cm/s MV E velocity: 115.00 cm/s MV A velocity: 66.70 cm/s   SHUNTS MV E/A ratio:  1.72         Systemic VTI:  0.16 m                             Systemic Diam: 2.10 cm Carlyle Dolly MD Electronically signed by Carlyle Dolly MD Signature Date/Time: 04/17/2021/10:51:32 AM    Final     Barton Dubois, MD  [facility]  If 7PM-7AM, please contact night-coverage [email or web address] Password [email or web address] 04/21/2021, 5:54 PM   LOS: 5 days

## 2021-04-21 NOTE — Progress Notes (Signed)
Subjective:  at least 1800 UOP recorded, missed recording one shift-  total 1300 negative-  BUN and crt about the same-  not nauseated this AM -  wanting breakfast   Vital signs in last 24 hours: Vitals:   04/20/21 1701 04/20/21 2126 04/21/21 0614 04/21/21 0627  BP: 134/60 (!) 150/81 (!) 163/87   Pulse:  86 88   Resp:  20 17   Temp:  99.2 F (37.3 C) 98.9 F (37.2 C)   TempSrc:  Oral Oral   SpO2:  90% 91%   Weight:    131.6 kg  Height:       Weight change: -7.366 kg  Intake/Output Summary (Last 24 hours) at 04/21/2021 0754 Last data filed at 04/21/2021 9628 Gross per 24 hour  Intake 851.98 ml  Output 1800 ml  Net -948.02 ml    Assessment/ Plan: Pt is a 55 y.o. yo male with stage 4 CKD-  crt in the low 3's thought due to DM/CHF who was admitted on 04/16/2021 with volume overload  Assessment/Plan: 1. Renal-  worsening of already impaired renal function in the setting of volume overload and diuresis.  Maybe trying to stabilize the last 48 hours.  No absolute indications for HD - was concerned about nausea being related to uremia but he does not have today.  He is obviously hoping to avoid dialysis and has some feelings  "family members who went on dialysis who are not with [place] anymore"  Will continue to take one day at a time.  He asks about transplant-  I am not sure how impaired his heart is but would certainly need to lose weight to be considered for something like that  2. HTN/volume-  still overloaded -  weights are not reliable-  was a liter out supposedly last 24 hours- I think underestimation.  intensified his diuretic regimen yesterday, seems to have been successful, no changes today.  BP likely to remain high since is still pretty volume overloaded  3. Anemia-  check iron stores (pending)and gave ESA 9/28 4. Secondary hyperparathyroidism-  last PTH 2 digits but that was a while ago 5. Hyperkalemia-  stable-  I anticipate will decrease more in the setting of lasix and  metolazone   Louis Meckel    Labs: Basic Metabolic Panel: Recent Labs  Lab 04/19/21 0542 04/20/21 0526 04/21/21 0536  NA 138 137 136  K 5.2* 5.4* 5.2*  CL 107 106 102  CO2 24 24 25   GLUCOSE 108* 115* 116*  BUN 74* 75* 76*  CREATININE 4.99* 4.86* 4.96*  CALCIUM 8.0* 8.2* 8.3*   Liver Function Tests: No results for input(s): AST, ALT, ALKPHOS, BILITOT, PROT, ALBUMIN in the last 168 hours. No results for input(s): LIPASE, AMYLASE in the last 168 hours. No results for input(s): AMMONIA in the last 168 hours. CBC: Recent Labs  Lab 04/16/21 0847  WBC 6.2  NEUTROABS 4.4  HGB 9.9*  HCT 31.4*  MCV 89.0  PLT 344   Cardiac Enzymes: No results for input(s): CKTOTAL, CKMB, CKMBINDEX, TROPONINI in the last 168 hours. CBG: Recent Labs  Lab 04/19/21 2034 04/20/21 0643 04/20/21 1122 04/20/21 1642 04/20/21 2128  GLUCAP 134* 119* 127* 120* 135*    Iron Studies: No results for input(s): IRON, TIBC, TRANSFERRIN, FERRITIN in the last 72 hours. Studies/Results: No results found. Medications: Infusions:  sodium chloride     furosemide 160 mg (04/21/21 0520)    Scheduled Medications:  amLODipine  10 mg Oral Daily  docusate sodium  100 mg Oral BID   heparin  5,000 Units Subcutaneous Q8H   hydrALAZINE  75 mg Oral TID   insulin aspart  0-9 Units Subcutaneous TID WC   labetalol  400 mg Oral BID   Living Better with Heart Failure Book   Does not apply Once   metolazone  5 mg Oral Daily   pravastatin  40 mg Oral q AM   sodium chloride flush  3 mL Intravenous Q12H    have reviewed scheduled and prn medications.  Physical Exam: General: alert, nad- obese-  looks brighter today  Heart: RRR Lungs:  dec BS at bases Abdomen: obese, soft , non tender Extremities: pitting edema    04/21/2021,7:54 AM  LOS: 5 days

## 2021-04-22 LAB — GLUCOSE, CAPILLARY
Glucose-Capillary: 118 mg/dL — ABNORMAL HIGH (ref 70–99)
Glucose-Capillary: 141 mg/dL — ABNORMAL HIGH (ref 70–99)
Glucose-Capillary: 139 mg/dL — ABNORMAL HIGH (ref 70–99)
Glucose-Capillary: 243 mg/dL — ABNORMAL HIGH (ref 70–99)

## 2021-04-22 LAB — CBC
HCT: 27.4 % — ABNORMAL LOW (ref 39.0–52.0)
Hemoglobin: 8.9 g/dL — ABNORMAL LOW (ref 13.0–17.0)
MCH: 28.1 pg (ref 26.0–34.0)
MCHC: 32.5 g/dL (ref 30.0–36.0)
MCV: 86.4 fL (ref 80.0–100.0)
Platelets: 280 10*3/uL (ref 150–400)
RBC: 3.17 MIL/uL — ABNORMAL LOW (ref 4.22–5.81)
RDW: 14.2 % (ref 11.5–15.5)
WBC: 4.4 10*3/uL (ref 4.0–10.5)
nRBC: 0 % (ref 0.0–0.2)

## 2021-04-22 LAB — RENAL FUNCTION PANEL
Albumin: 3 g/dL — ABNORMAL LOW (ref 3.5–5.0)
Anion gap: 9 (ref 5–15)
BUN: 78 mg/dL — ABNORMAL HIGH (ref 6–20)
CO2: 27 mmol/L (ref 22–32)
Calcium: 8.5 mg/dL — ABNORMAL LOW (ref 8.9–10.3)
Chloride: 100 mmol/L (ref 98–111)
Creatinine, Ser: 5.16 mg/dL — ABNORMAL HIGH (ref 0.61–1.24)
GFR, Estimated: 12 mL/min — ABNORMAL LOW (ref >60)
Glucose, Bld: 116 mg/dL — ABNORMAL HIGH (ref 70–99)
Phosphorus: 5.2 mg/dL — ABNORMAL HIGH (ref 2.5–4.6)
Potassium: 4.8 mmol/L (ref 3.5–5.1)
Sodium: 136 mmol/L (ref 135–145)

## 2021-04-22 NOTE — Progress Notes (Signed)
Subjective:  at least 3000 UOP recorded, 2200 negative-  BUN and crt trending up a little-  not nauseated this AM -  wanting breakfast -  looks great-  no O2-  thinks that he is ready to go home  Vital signs in last 24 hours: Vitals:   04/21/21 1700 04/21/21 2149 04/22/21 0500 04/22/21 0535  BP: 120/60 135/62  (!) 143/72  Pulse: 83 82  84  Resp: 16     Temp: 98.1 F (36.7 C) 98.4 F (36.9 C)    TempSrc: Oral Oral    SpO2: 94% 93%  91%  Weight:   131.5 kg   Height:       Weight change: -0.134 kg  Intake/Output Summary (Last 24 hours) at 04/22/2021 0847 Last data filed at 04/22/2021 0700 Gross per 24 hour  Intake 744 ml  Output 3000 ml  Net -2256 ml    Assessment/ Plan: Pt is a 55 y.o. yo male with stage 4 CKD-  crt in the low 3's thought due to DM/CHF who was admitted on 04/16/2021 with volume overload  Assessment/Plan: 1. Renal-  worsening of already impaired renal function in the setting of volume overload and diuresis.  No absolute indications for HD - was concerned about nausea being related to uremia but he has not had for the last 2 days.  He is obviously hoping to avoid dialysis and has some feelings  "family members who went on dialysis who are not with [place] anymore"  Has stabilized for now but is not out of the woods by any means 2. HTN/volume-  still overloaded -  weights are not reliable-  but have made progress while here.  intensified his diuretic regimen , seems to have been successful.  BP likely to remain high since is still pretty volume overloaded -  better.  If were considering sending him home-  I would change diuretic to torsemide 100 BID ( from 80 bid ) and metolozone 2.5 daily until he sees Dr. Theador Hawthorne.  Would keep him on current regimen though if stays an inpatient 3. Anemia-  iron stores OK-  gave ESA 9/28 4. Secondary hyperparathyroidism-  last PTH 2 digits but that was a while ago 5. Hyperkalemia-  stable-   6.  Dispo-  I have no issue with discharge on  torsemide 100 BID and metolazone 2.5 daily but will need quick follow up with Dr. Theador Hawthorne as OP  Pt will not be physically seen over the weekend if he stays but labs and I/O s will be reviewed -  call with questions   [name]    Labs: Basic Metabolic Panel: Recent Labs  Lab 04/20/21 0526 04/21/21 0536 04/22/21 0558  NA 137 136 136  K 5.4* 5.2* 4.8  CL 106 102 100  CO2 24 25 27   GLUCOSE 115* 116* 116*  BUN 75* 76* 78*  CREATININE 4.86* 4.96* 5.16*  CALCIUM 8.2* 8.3* 8.5*  PHOS  --   --  5.2*   Liver Function Tests: Recent Labs  Lab 04/22/21 0558  ALBUMIN 3.0*   No results for input(s): LIPASE, AMYLASE in the last 168 hours. No results for input(s): AMMONIA in the last 168 hours. CBC: Recent Labs  Lab 04/16/21 0847 04/22/21 0558  WBC 6.2 4.4  NEUTROABS 4.4  --   HGB 9.9* 8.9*  HCT 31.4* 27.4*  MCV 89.0 86.4  PLT 344 280   Cardiac Enzymes: No results for input(s): CKTOTAL, CKMB, CKMBINDEX, TROPONINI in the  last 168 hours. CBG: Recent Labs  Lab 04/21/21 0801 04/21/21 1136 04/21/21 1710 04/21/21 2149 04/22/21 0722  GLUCAP 113* 190* 134* 139* 118*    Iron Studies:  Recent Labs    04/21/21 0536  IRON 84  TIBC 256  FERRITIN 127   Studies/Results: No results found. Medications: Infusions:  sodium chloride     furosemide 160 mg (04/22/21 0531)    Scheduled Medications:  amLODipine  10 mg Oral Daily   docusate sodium  100 mg Oral BID   heparin  5,000 Units Subcutaneous Q8H   hydrALAZINE  75 mg Oral TID   insulin aspart  0-9 Units Subcutaneous TID WC   labetalol  400 mg Oral BID   Living Better with Heart Failure Book   Does not apply Once   metolazone  5 mg Oral Daily   pravastatin  40 mg Oral q AM   sodium chloride flush  3 mL Intravenous Q12H    have reviewed scheduled and prn medications.  Physical Exam: General: alert, nad- obese-  looks brighter today  Heart: RRR Lungs:  dec BS at bases Abdomen: obese, soft , non  tender Extremities: pitting edema    04/22/2021,8:47 AM  LOS: 6 days

## 2021-04-22 NOTE — Progress Notes (Signed)
PROGRESS NOTE  [name] DUK:[id number] DOB: [birth year] DOA: 04/16/2021 PCP: [name], MD     Brief History:  55 y.o. male with medical history of diabetes mellitus type 2, CKD stage III, diastolic CHF, hyperlipidemia presenting with 2-week history of worsening shortness of breath.  He states that he has had exertional dyspnea for greater than 1 year at this time.  He denies any fevers, chills, headache, chest pain, coughing, hemoptysis, nausea, vomiting, diarrhea, abdominal pain.  He complains of worsening lower extremity edema and increasing abdominal girth.  He has orthopnea type symptoms.  He endorses compliance with all his medications.  However, the patient states that he drinks at least 3 x 20 ounce bottles of water daily.  He last saw Dr. Loralie Champagne on 12/29/2020 when his torsemide was increased to 80 mg twice daily.  In the office, his weight was 287 pounds.  He last followed up in the CHF clinic on 02/18/2021.  On that day, his weight was 282 pounds.  His torsemide was increased to 100 mg twice daily x2 days then back to 80 mg twice daily.  He was continued on hydralazine 75 mg twice daily, labetalol 400 mg twice daily and amlodipine 10 mg daily.   In the emergency department, the patient was afebrile hemodynamically stable.  Initial oxygen saturation was 74% on room air.  He was placed on 3 L with saturation up to 100%.  BMP showed a sodium 140, potassium 5.5, serum creatinine 3.36.  BNP was 461.  WBC 6.2, hemoglobin 9.9, platelets 344,000.  The patient was given 40 mg IV furosemide.  I I have ordered an additional 40 mg to be given IV.  Chest x-ray showed increasing pulmonary interstitial markings.   Assessment/Plan: Acute on chronic diastolic CHF -3/76/2831 echo EF 55%, no WMA, grade 2 DD, trivial MR -Continue current IV diuresis using Lasix 120 mg 3 times a day along with metolazone. -Continue to encourage fluid restrict -Continue to follow accurate I's and  O's -patient endorses indiscretion with fluids as well as missing doses of his medications -04/17/21 Echo--EF 55-60%, no WMA, mild TR -Continue to follow nephrology service recommendation -Advised to be compliant with low-sodium diet. -Patient roughly 5 L negative in the last 36 hours.  Weight 131.6 kg today   Acute respiratory failure with hypoxia -due to pulmonary edema -74% on RA at time of admission -Initially requiring 3 L nasal cannula supplementation; good oxygen saturation on room air currently.   Essential hypertension -Restart hydralazine, labetalol, amlodipine -Patient also receiving IV Lasix -Continue to follow vital signs.   Diabetes mellitus type 2 with nephropathy -01/18/2021 hemoglobin A1c 6.7 -04/16/21 A1C--6.6 -Continue NovoLog sliding scale -Continue holding Amaryl   Acute on Chronic Renal Failure--at baseline with CKD stage IV -His renal baseline has been widely variable; Primarily baseline 2.7-3.0 -will need to tolerate worsen renal function for euvolemia -appreciate renal service recommendations -Some improvement appreciated on patient's creatinine level after adjusted dose of diuresis. -Continue Lasix 120 mg 3 times a day along with metolazone. -Continue to check renal function on daily basis. -Creatinine has slightly worsened in the setting of fluid overload and diuresis.   Hyperlipidemia -Continue statin -Advised to follow heart healthy diet.   Hyperkalemia -anticipate improvement with diuresis -Continue to follow electrolytes trend  Morbid obesity -Body mass index is 40.43 kg/m. -Low calorie diet, portion control and increase physical activity discussed with patient.      Status  is: Inpatient   Remains inpatient appropriate because:Inpatient level of care appropriate due to severity of illness   Dispo: The patient is from: Home              Anticipated d/c is to: Home              Patient currently is not medically stable to d/c.               Difficult to place patient No       Family Communication:   No family at bedside; significant over approved for updates by patient contacted over the phone.   Consultants: Nephrology service   Code Status:  FULL   DVT Prophylaxis:  [place] Heparin      Procedures: As Listed in Progress Note Above   Antibiotics: None    Subjective: Patient reports good urine output; no chest pain, no nausea, no vomiting.  Breathing continue improving.  Still with signs of fluid overload and having mild orthopnea symptoms.   Objective: Vitals:   04/21/21 2149 04/22/21 0500 04/22/21 0535 04/22/21 1357  BP: 135/62  (!) 143/72 135/76  Pulse: 82  84 81  Resp:    17  Temp: 98.4 F (36.9 C)   98.3 F (36.8 C)  TempSrc: Oral   Oral  SpO2: 93%  91% 90%  Weight:  131.5 kg    Height:        Intake/Output Summary (Last 24 hours) at 04/22/2021 1851 Last data filed at 04/22/2021 1846 Gross per 24 hour  Intake 1124.84 ml  Output 4200 ml  Net -3075.16 ml   Weight change: -0.134 kg  Exam: General exam: Alert, awake, oriented x 3, reports mild orthopnea and is still having signs of fluid overload.  No chest pain, no nausea, no vomiting or vital reports breathing is better and improved. Respiratory system: Decreased breath sounds at the bases, no frank crackles, no wheezing, no using accessory muscles. Cardiovascular system:RRR. No murmurs, rubs, gallops.  Unable to assess JVD with body habitus. Gastrointestinal system: Abdomen is obese, nondistended, soft and nontender. No organomegaly or masses felt. Normal bowel sounds heard. Central nervous system: Alert and oriented. No focal neurological deficits. Extremities: No cyanosis or clubbing; 1+ edema bilaterally. Skin: No petechiae. Psychiatry: Judgement and insight appear normal. Mood & affect appropriate.    Data Reviewed: I have personally reviewed following labs and imaging studies  Basic Metabolic Panel: Recent Labs  Lab 04/18/21 0547  04/19/21 0542 04/20/21 0526 04/21/21 0536 04/22/21 0558  NA 142 138 137 136 136  K 5.3* 5.2* 5.4* 5.2* 4.8  CL 111 107 106 102 100  CO2 24 24 24 25 27   GLUCOSE 105* 108* 115* 116* 116*  BUN 72* 74* 75* 76* 78*  CREATININE 4.46* 4.99* 4.86* 4.96* 5.16*  CALCIUM 8.0* 8.0* 8.2* 8.3* 8.5*  PHOS  --   --   --   --  5.2*   CBC: Recent Labs  Lab 04/16/21 0847 04/22/21 0558  WBC 6.2 4.4  NEUTROABS 4.4  --   HGB 9.9* 8.9*  HCT 31.4* 27.4*  MCV 89.0 86.4  PLT 344 280    CBG: Recent Labs  Lab 04/21/21 1710 04/21/21 2149 04/22/21 0722 04/22/21 1110 04/22/21 1601  GLUCAP 134* 139* 118* 141* 139*    Urine analysis:    Component Value Date/Time   COLORURINE YELLOW 06/14/2020 1452   APPEARANCEUR CLEAR 06/14/2020 1452   LABSPEC 1.010 06/14/2020 1452   PHURINE 5.0 06/14/2020 1452  GLUCOSEU NEGATIVE 06/14/2020 1452   HGBUR NEGATIVE 06/14/2020 1452   [place] 06/14/2020 1452   [place] 06/14/2020 1452   PROTEINUR 100 (A) 06/14/2020 1452   NITRITE NEGATIVE 06/14/2020 1452   LEUKOCYTESUR NEGATIVE 06/14/2020 1452   Sepsis Labs:  Recent Results (from the past 240 hour(s))  Resp Panel by RT-PCR (Flu A&B, Covid) Nasopharyngeal Swab     Status: None   Collection Time: 04/16/21  8:50 AM   Specimen: Nasopharyngeal Swab; Nasopharyngeal(NP) swabs in vial transport medium  Result Value Ref Range Status   SARS Coronavirus 2 by RT PCR NEGATIVE NEGATIVE Final    Comment: (NOTE) SARS-CoV-2 target nucleic acids are NOT DETECTED.  The SARS-CoV-2 RNA is generally detectable in upper respiratory specimens during the acute phase of infection. The lowest concentration of SARS-CoV-2 viral copies this assay can detect is 138 copies/mL. A negative result does not preclude SARS-Cov-2 infection and should not be used as the sole basis for treatment or other patient management decisions. A negative result may occur with  improper specimen collection/handling, submission  of specimen other than nasopharyngeal swab, presence of viral mutation(s) within the areas targeted by this assay, and inadequate number of viral copies(<138 copies/mL). A negative result must be combined with clinical observations, patient history, and epidemiological information. The expected result is Negative.  Fact Sheet for Patients:  EntrepreneurPulse.com.au  Fact Sheet for Healthcare Providers:  IncredibleEmployment.be  This test is no t yet approved or cleared by the [place] FDA and  has been authorized for detection and/or diagnosis of SARS-CoV-2 by FDA under an Emergency Use Authorization (EUA). This EUA will remain  in effect (meaning this test can be used) for the duration of the COVID-19 declaration under Section 564(b)(1) of the Act, 21 U.S.C.section 360bbb-3(b)(1), unless the authorization is terminated  or revoked sooner.       Influenza A by PCR NEGATIVE NEGATIVE Final   Influenza B by PCR NEGATIVE NEGATIVE Final    Comment: (NOTE) The Xpert Xpress SARS-CoV-2/FLU/RSV plus assay is intended as an aid in the diagnosis of influenza from Nasopharyngeal swab specimens and should not be used as a sole basis for treatment. Nasal washings and aspirates are unacceptable for Xpert Xpress SARS-CoV-2/FLU/RSV testing.  Fact Sheet for Patients: EntrepreneurPulse.com.au  Fact Sheet for Healthcare Providers: IncredibleEmployment.be  This test is not yet approved or cleared by the [place] FDA and has been authorized for detection and/or diagnosis of SARS-CoV-2 by FDA under an Emergency Use Authorization (EUA). This EUA will remain in effect (meaning this test can be used) for the duration of the COVID-19 declaration under Section 564(b)(1) of the Act, 21 U.S.C. section 360bbb-3(b)(1), unless the authorization is terminated or revoked.  Performed at [facility], [address],  [place], [place] [address]      Scheduled Meds:  amLODipine  10 mg Oral Daily   docusate sodium  100 mg Oral BID   heparin  5,000 Units Subcutaneous Q8H   hydrALAZINE  75 mg Oral TID   insulin aspart  0-9 Units Subcutaneous TID WC   labetalol  400 mg Oral BID   Living Better with Heart Failure Book   Does not apply Once   metolazone  5 mg Oral Daily   pravastatin  40 mg Oral q AM   sodium chloride flush  3 mL Intravenous Q12H   Continuous Infusions:  sodium chloride     furosemide 160 mg (04/22/21 1518)    Procedures/Studies: DG Chest Port 1 [address]  Result Date: 04/16/2021 CLINICAL DATA:  55 year old male with shortness of breath for several days. EXAM: PORTABLE CHEST 1 VIEW COMPARISON:  Chest radiographs 09/30/2020 and earlier. FINDINGS: Portable AP upright view at 0838 hours. Unresolved right pleural effusion, now partially tracking into the minor fissure. Stable hypo ventilation at the right lung base. Mildly lower lung volumes overall. Mediastinal contours remain within normal limits. Stable left lung. No pneumothorax. Perihilar pulmonary vascularity appears increased. Visualized tracheal air column is within normal limits. No acute osseous abnormality identified. Paucity of bowel gas in the upper abdomen. IMPRESSION: 1. Persistent moderate right pleural effusion since March. Stable associated right lung base hypoventilation. 2. Interval increased pulmonary vascularity suggesting acute pulmonary edema. Electronically Signed   By: Genevie Ann M.D.   On: 04/16/2021 08:49   ECHOCARDIOGRAM COMPLETE  Result Date: 04/17/2021    ECHOCARDIOGRAM REPORT   Patient Name:   TINA TEMME Date of Exam: 04/17/2021 Medical Rec #:  [id number]       Height:       71.0 in Accession #:    [id number]      Weight:       278.0 lb Date of Birth:  04-27-1966       BSA:          2.425 m Patient Age:    25 years        BP:           149/89 mmHg Patient Gender: M               HR:           86 bpm. Exam Location:  [facility] Procedure: 2D Echo, Cardiac Doppler and Color Doppler Indications:    CHF  History:        Patient has prior history of Echocardiogram examinations, most                 recent 04/06/2020. CHF, Signs/Symptoms:Shortness of Breath, Edema                 and CKD, Resp. distress; Risk Factors:Hypertension, Diabetes,                 Dyslipidemia and Morbid obesity.  Sonographer:    Dustin Flock RDCS Referring Phys: [phone] DAVID TAT  Sonographer Comments: Patient is morbidly obese and Technically difficult study due to poor echo windows. IMPRESSIONS  1. Left ventricular ejection fraction, by estimation, is 55 to 60%. The left ventricle has normal function. The left ventricle has no regional wall motion abnormalities. There is moderate left ventricular hypertrophy. Left ventricular diastolic parameters are indeterminate.  2. Right ventricular systolic function is normal. The right ventricular size is normal. There is moderately elevated pulmonary artery systolic pressure.  3. Left atrial size was mildly dilated.  4. The mitral valve is normal in structure. No evidence of mitral valve regurgitation. No evidence of mitral stenosis.  5. The tricuspid valve is abnormal.  6. The aortic valve is tricuspid. Aortic valve regurgitation is not visualized. No aortic stenosis is present.  7. The inferior vena cava is normal in size with greater than 50% respiratory variability, suggesting right atrial pressure of 3 mmHg. FINDINGS  Left Ventricle: Left ventricular ejection fraction, by estimation, is 55 to 60%. The left ventricle has normal function. The left ventricle has no regional wall motion abnormalities. The left ventricular internal cavity size was normal in size. There is  moderate left ventricular hypertrophy. Left ventricular diastolic parameters are  indeterminate. Right Ventricle: The right ventricular size is normal. No increase in right ventricular wall thickness. Right ventricular systolic function is normal.  There is moderately elevated pulmonary artery systolic pressure. The tricuspid regurgitant velocity is 3.40 m/s, and with an assumed right atrial pressure of 3 mmHg, the estimated right ventricular systolic pressure is 20.3 mmHg. Left Atrium: Left atrial size was mildly dilated. Right Atrium: Right atrial size was normal in size. Pericardium: There is no evidence of pericardial effusion. Mitral Valve: The mitral valve is normal in structure. No evidence of mitral valve regurgitation. No evidence of mitral valve stenosis. Tricuspid Valve: The tricuspid valve is abnormal. Tricuspid valve regurgitation is mild . No evidence of tricuspid stenosis. Aortic Valve: The aortic valve is tricuspid. Aortic valve regurgitation is not visualized. No aortic stenosis is present. Aortic valve mean gradient measures 3.9 mmHg. Aortic valve peak gradient measures 8.0 mmHg. Aortic valve area, by VTI measures 1.83 cm. Pulmonic Valve: The pulmonic valve was not well visualized. Pulmonic valve regurgitation is mild. No evidence of pulmonic stenosis. Aorta: The aortic root is normal in size and structure. Venous: The inferior vena cava is normal in size with greater than 50% respiratory variability, suggesting right atrial pressure of 3 mmHg. IAS/Shunts: No atrial level shunt detected by color flow Doppler.  LEFT VENTRICLE PLAX 2D LVIDd:         4.70 cm      Diastology LVIDs:         3.50 cm      LV e' medial:    9.01 cm/s LV PW:         1.50 cm      LV E/e' medial:  12.8 LV IVS:        1.40 cm      LV e' lateral:   11.00 cm/s LVOT diam:     2.10 cm      LV E/e' lateral: 10.5 LV SV:         54 LV SV Index:   22 LVOT Area:     3.46 cm  LV Volumes (MOD) LV vol d, MOD A4C: 187.0 ml LV vol s, MOD A4C: 68.3 ml LV SV MOD A4C:     187.0 ml RIGHT VENTRICLE RV Basal diam:  3.40 cm RV S prime:     13.50 cm/s TAPSE (M-mode): 3.0 cm LEFT ATRIUM           Index       RIGHT ATRIUM           Index LA diam:      3.90 cm 1.61 cm/m  RA Area:     16.50 cm  LA Vol (A2C): 49.5 ml 20.41 ml/m RA Volume:   47.90 ml  19.75 ml/m LA Vol (A4C): 86.0 ml 35.46 ml/m  AORTIC VALVE AV Area (Vmax):    2.31 cm AV Area (Vmean):   2.32 cm AV Area (VTI):     1.83 cm AV Vmax:           141.17 cm/s AV Vmean:          90.030 cm/s AV VTI:            0.295 m AV Peak Grad:      8.0 mmHg AV Mean Grad:      3.9 mmHg LVOT Vmax:         94.20 cm/s LVOT Vmean:        60.400 cm/s LVOT VTI:  0.156 m LVOT/AV VTI ratio: 0.53  AORTA Ao Root diam: 2.80 cm MITRAL VALVE                TRICUSPID VALVE MV Area (PHT): 5.06 cm     TR Peak grad:   46.2 mmHg MV Decel Time: 150 msec     TR Vmax:        340.00 cm/s MV E velocity: 115.00 cm/s MV A velocity: 66.70 cm/s   SHUNTS MV E/A ratio:  1.72         Systemic VTI:  0.16 m                             Systemic Diam: 2.10 cm Carlyle Dolly MD Electronically signed by Carlyle Dolly MD Signature Date/Time: 04/17/2021/10:51:32 AM    Final     Barton Dubois, MD  [facility]  If 7PM-7AM, please contact night-coverage [email or web address] Password [email or web address] 04/22/2021, 6:51 PM   LOS: 6 days

## 2021-04-23 DIAGNOSIS — E1121 Type 2 diabetes mellitus with diabetic nephropathy: Secondary | ICD-10-CM

## 2021-04-23 DIAGNOSIS — Z6838 Body mass index (BMI) 38.0-38.9, adult: Secondary | ICD-10-CM

## 2021-04-23 DIAGNOSIS — E6609 Other obesity due to excess calories: Secondary | ICD-10-CM

## 2021-04-23 LAB — GLUCOSE, CAPILLARY: Glucose-Capillary: 129 mg/dL — ABNORMAL HIGH (ref 70–99)

## 2021-04-23 MED ORDER — TORSEMIDE 20 MG PO TABS: 100.0000 mg | ORAL_TABLET | Freq: Two times a day (BID) | ORAL | 3 refills | Status: DC

## 2021-04-23 MED ORDER — METOLAZONE 2.5 MG PO TABS: 2.5000 mg | ORAL_TABLET | Freq: Every day | ORAL | 1 refills | Status: DC

## 2021-04-23 MED ORDER — GLIMEPIRIDE 4 MG PO TABS: 2.0000 mg | ORAL_TABLET | Freq: Two times a day (BID) | ORAL | Status: DC

## 2021-04-23 NOTE — Discharge Summary (Signed)
Physician Discharge Summary  [name] RUE:[id number] DOB: Nov 22, 1965 DOA: 04/16/2021  PCP: [name], MD  Admit date: 04/16/2021 Discharge date: 04/23/2021  Time spent: 35 minutes  Recommendations for Outpatient Follow-up:  Repeat basic metabolic panel to follow electrolytes and renal function Continue to have close follow-up with cardiology and nephrology service to further titrate medications and if needed initiate process for hemodialysis. Continue close monitoring of patient's CBGs with further adjustment to hypoglycemia regimen as needed. Reassess blood pressure and further adjust antihypertensive regimen as required.  Discharge Diagnoses:  Active Problems:   Essential hypertension   Acute on chronic diastolic CHF (congestive heart failure) (HCC)   CKD (chronic kidney disease) stage 4, GFR 15-29 ml/min (HCC)   Acute renal failure superimposed on stage 4 chronic kidney disease (HCC)   Class 2 obesity due to excess calories with body mass index (BMI) of 38.0 to 38.9 in adult   Discharge Condition: Stable and improved.  Discharged home with instructions to follow-up with PCP, nephrology and cardiology service as an outpatient.  CODE STATUS: Full code.  Diet recommendation: Heart healthy, modified carbohydrates and low calorie.  Filed Weights   04/21/21 0627 04/22/21 0500 04/23/21 0600  Weight: 131.6 kg 131.5 kg 126.6 kg    History of present illness:  55 y.o. male with medical history of diabetes mellitus type 2, CKD stage III, diastolic CHF, hyperlipidemia presenting with 2-week history of worsening shortness of breath.  He states that he has had exertional dyspnea for greater than 1 year at this time.  He denies any fevers, chills, headache, chest pain, coughing, hemoptysis, nausea, vomiting, diarrhea, abdominal pain.  He complains of worsening lower extremity edema and increasing abdominal girth.  He has orthopnea type symptoms.  He endorses compliance with all his  medications.  However, the patient states that he drinks at least 3 x 20 ounce bottles of water daily.  He last saw Dr. Loralie Champagne on 12/29/2020 when his torsemide was increased to 80 mg twice daily.  In the office, his weight was 287 pounds.  He last followed up in the CHF clinic on 02/18/2021.  On that day, his weight was 282 pounds.  His torsemide was increased to 100 mg twice daily x2 days then back to 80 mg twice daily.  He was continued on hydralazine 75 mg twice daily, labetalol 400 mg twice daily and amlodipine 10 mg daily.   In the emergency department, the patient was afebrile hemodynamically stable.  Initial oxygen saturation was 74% on room air.  He was placed on 3 L with saturation up to 100%.  BMP showed a sodium 140, potassium 5.5, serum creatinine 3.36.  BNP was 461.  WBC 6.2, hemoglobin 9.9, platelets 344,000.  The patient was given 40 mg IV furosemide.  I I have ordered an additional 40 mg to be given IV.  Chest x-ray showed increasing pulmonary interstitial markings.  Hospital Course:  Acute on chronic diastolic CHF -Continue to follow daily weights and adequate hydration.  S -patient encouraged to follow-up as an outpatient with cardiology service. -04/17/21 Echo--EF 55-60%, no WMA, mild TR -Following nephrology service recommendations patient has been discharged on torsemide 100 mg twice a day along with 2.5 mg of metolazone daily. -Advised to be compliant with low-sodium diet. -Over 11 L remove at time of discharge; patient's weight 126 kg.   Acute respiratory failure with hypoxia -due to pulmonary edema -74% on RA at time of admission -Initially requiring 3 L nasal cannula supplementation; good  oxygen saturation on room air at time of discharge. -Patient reports no shortness of breath.   Essential hypertension -Patient advised to follow heart healthy diet -Resume home antihypertensive regimen -Patient with adjusted dose of metolazone and torsemide.   Diabetes mellitus  type 2 with nephropathy -01/18/2021 hemoglobin A1c 6.7 -04/16/21 A1C--6.6 -Continue to follow modified carbohydrate diet -Resuming oral hypoglycemic agents.   Acute on Chronic Renal Failure--at baseline with CKD stage IV -His renal baseline has been widely variable; Primarily baseline 2.7-3.0 -will need to tolerate worsen renal function for euvolemia -Creatinine level has remained stable at time of discharge -Patient advised to maintain hydration and to follow low-sodium diet. -Outpatient follow-up with nephrology service in 10 days -Following inpatient nephrology recommendations patient has been discharged on torsemide 800 mg twice a day along with 2.5 mg daily metolazone   Hyperlipidemia -Continue statin -Advised to follow heart healthy diet.   Hyperkalemia -Improved and is stabilized with diuresis -Repeat basic metabolic panel follow-up visit to reassess electrolytes trend and stability.   Morbid obesity -Body mass index is 38.91 kg/m. -Class II obesity based on BMI -Low calorie diet, portion control and increase physical activity discussed with patient.    Procedures: See below for x-ray reports  Consultations: Nephrology service  Discharge Exam: Vitals:   04/22/21 2226 04/22/21 2230  BP: (!) 148/77 (!) 148/78  Pulse: 61 79  Resp:    Temp:  98.3 F (36.8 C)  SpO2:  92%    General: Afebrile, no chest pain, no nausea, no vomiting.  Patient reports improvement in his breathing and very little orthopnea at time of discharge.  Feeling ready to go home.  Continues to reports good urine output. Cardiovascular: Rate controlled, no rubs, no gallops, unable to assess JVD with body habitus. Respiratory: Improved air movement bilaterally; no wheezing, no using accessory muscles.  No frank crackles on exam. Abdomen: Obese, soft, nontender, nondistended, positive bowel sounds. Extremities: No cyanosis or clubbing; trace to 1+ edema appreciated bilaterally.   Discharge  Instructions   Discharge Instructions     (HEART FAILURE PATIENTS) Call MD:  Anytime you have any of the following symptoms: 1) 3 pound weight gain in 24 hours or 5 pounds in 1 week 2) shortness of breath, with or without a dry hacking cough 3) swelling in the hands, feet or stomach 4) if you have to sleep on extra pillows at night in order to breathe.   Complete by: As directed    Diet - low sodium heart healthy   Complete by: As directed    Discharge instructions   Complete by: As directed    Take medications as prescribed Maintain adequate hydration Follow-up with nephrologist (Dr. August Albino) in 10 days Arrange follow-up with PCP in 2 weeks Make sure to check your weight on daily basis and follow low-sodium diet (less than 2 g daily).   Increase activity slowly   Complete by: As directed       Allergies as of 04/23/2021   No Known Allergies      Medication List     TAKE these medications    acetaminophen 325 MG tablet Commonly known as: TYLENOL Take 2 tablets (650 mg total) by mouth every 6 (six) hours as needed for mild pain (or Fever >/= 101).   amLODipine 10 MG tablet Commonly known as: NORVASC Take 1 tablet (10 mg total) by mouth daily.   Garlic Oil 3299 MG Caps Take by mouth.   glimepiride 4 MG tablet Commonly known as:  AMARYL Take 0.5 tablets (2 mg total) by mouth 2 (two) times daily. What changed: additional instructions   hydrALAZINE 50 MG tablet Commonly known as: APRESOLINE Take 1.5 tablets (75 mg total) by mouth 3 (three) times daily.   labetalol 200 MG tablet Commonly known as: NORMODYNE Take 400 mg by mouth 2 (two) times daily.   metolazone 2.5 MG tablet Commonly known as: ZAROXOLYN Take 1 tablet (2.5 mg total) by mouth daily. What changed: when to take this   multivitamin with minerals Tabs tablet Take 1 tablet by mouth daily.   pravastatin 40 MG tablet Commonly known as: PRAVACHOL Take 40 mg by mouth in the morning.   torsemide 20 MG  tablet Commonly known as: DEMADEX Take 5 tablets (100 mg total) by mouth 2 (two) times daily. What changed: how much to take   Zinc 50 MG Caps Take by mouth.       No Known Allergies  Follow-up Information     [name], MD. Schedule an appointment as soon as possible for a visit in 2 week(s).   Specialty: Family Medicine Contact information: [place] [address] [phone]         [name], MD .   Specialty: Cardiology Contact information: [address] [place] [address] [phone]         Liana Gerold, MD. Schedule an appointment as soon as possible for a visit in 10 day(s).   Specialty: Nephrology Contact information: [address] [address] [phone]                 The results of significant diagnostics from this hospitalization (including imaging, microbiology, ancillary and laboratory) are listed below for reference.    Significant Diagnostic Studies: DG Chest Port 1 View  Result Date: 04/16/2021 CLINICAL DATA:  55 year old male with shortness of breath for several days. EXAM: PORTABLE CHEST 1 VIEW COMPARISON:  Chest radiographs 09/30/2020 and earlier. FINDINGS: Portable AP upright view at 0838 hours. Unresolved right pleural effusion, now partially tracking into the minor fissure. Stable hypo ventilation at the right lung base. Mildly lower lung volumes overall. Mediastinal contours remain within normal limits. Stable left lung. No pneumothorax. Perihilar pulmonary vascularity appears increased. Visualized tracheal air column is within normal limits. No acute osseous abnormality identified. Paucity of bowel gas in the upper abdomen. IMPRESSION: 1. Persistent moderate right pleural effusion since March. Stable associated right lung base hypoventilation. 2. Interval increased pulmonary vascularity suggesting acute pulmonary edema. Electronically Signed   By: Genevie Ann M.D.   On:  04/16/2021 08:49   ECHOCARDIOGRAM COMPLETE  Result Date: 04/17/2021    ECHOCARDIOGRAM REPORT   Patient Name:   ZYIERE ROSEMOND Date of Exam: 04/17/2021 Medical Rec #:  [id number]       Height:       71.0 in Accession #:    [id number]      Weight:       278.0 lb Date of Birth:  January 09, 1966       BSA:          2.425 m Patient Age:    5 years        BP:           149/89 mmHg Patient Gender: M               HR:           86 bpm. Exam Location:  [facility] Procedure: 2D  Echo, Cardiac Doppler and Color Doppler Indications:    CHF  History:        Patient has prior history of Echocardiogram examinations, most                 recent 04/06/2020. CHF, Signs/Symptoms:Shortness of Breath, Edema                 and CKD, Resp. distress; Risk Factors:Hypertension, Diabetes,                 Dyslipidemia and Morbid obesity.  Sonographer:    Dustin Flock RDCS Referring Phys: [phone] DAVID TAT  Sonographer Comments: Patient is morbidly obese and Technically difficult study due to poor echo windows. IMPRESSIONS  1. Left ventricular ejection fraction, by estimation, is 55 to 60%. The left ventricle has normal function. The left ventricle has no regional wall motion abnormalities. There is moderate left ventricular hypertrophy. Left ventricular diastolic parameters are indeterminate.  2. Right ventricular systolic function is normal. The right ventricular size is normal. There is moderately elevated pulmonary artery systolic pressure.  3. Left atrial size was mildly dilated.  4. The mitral valve is normal in structure. No evidence of mitral valve regurgitation. No evidence of mitral stenosis.  5. The tricuspid valve is abnormal.  6. The aortic valve is tricuspid. Aortic valve regurgitation is not visualized. No aortic stenosis is present.  7. The inferior vena cava is normal in size with greater than 50% respiratory variability, suggesting right atrial pressure of 3 mmHg. FINDINGS  Left Ventricle: Left ventricular ejection fraction,  by estimation, is 55 to 60%. The left ventricle has normal function. The left ventricle has no regional wall motion abnormalities. The left ventricular internal cavity size was normal in size. There is  moderate left ventricular hypertrophy. Left ventricular diastolic parameters are indeterminate. Right Ventricle: The right ventricular size is normal. No increase in right ventricular wall thickness. Right ventricular systolic function is normal. There is moderately elevated pulmonary artery systolic pressure. The tricuspid regurgitant velocity is 3.40 m/s, and with an assumed right atrial pressure of 3 mmHg, the estimated right ventricular systolic pressure is 52.8 mmHg. Left Atrium: Left atrial size was mildly dilated. Right Atrium: Right atrial size was normal in size. Pericardium: There is no evidence of pericardial effusion. Mitral Valve: The mitral valve is normal in structure. No evidence of mitral valve regurgitation. No evidence of mitral valve stenosis. Tricuspid Valve: The tricuspid valve is abnormal. Tricuspid valve regurgitation is mild . No evidence of tricuspid stenosis. Aortic Valve: The aortic valve is tricuspid. Aortic valve regurgitation is not visualized. No aortic stenosis is present. Aortic valve mean gradient measures 3.9 mmHg. Aortic valve peak gradient measures 8.0 mmHg. Aortic valve area, by VTI measures 1.83 cm. Pulmonic Valve: The pulmonic valve was not well visualized. Pulmonic valve regurgitation is mild. No evidence of pulmonic stenosis. Aorta: The aortic root is normal in size and structure. Venous: The inferior vena cava is normal in size with greater than 50% respiratory variability, suggesting right atrial pressure of 3 mmHg. IAS/Shunts: No atrial level shunt detected by color flow Doppler.  LEFT VENTRICLE PLAX 2D LVIDd:         4.70 cm      Diastology LVIDs:         3.50 cm      LV e' medial:    9.01 cm/s LV PW:         1.50 cm      LV E/e' medial:  12.8 LV IVS:        1.40 cm       LV e' lateral:   11.00 cm/s LVOT diam:     2.10 cm      LV E/e' lateral: 10.5 LV SV:         54 LV SV Index:   22 LVOT Area:     3.46 cm  LV Volumes (MOD) LV vol d, MOD A4C: 187.0 ml LV vol s, MOD A4C: 68.3 ml LV SV MOD A4C:     187.0 ml RIGHT VENTRICLE RV Basal diam:  3.40 cm RV S prime:     13.50 cm/s TAPSE (M-mode): 3.0 cm LEFT ATRIUM           Index       RIGHT ATRIUM           Index LA diam:      3.90 cm 1.61 cm/m  RA Area:     16.50 cm LA Vol (A2C): 49.5 ml 20.41 ml/m RA Volume:   47.90 ml  19.75 ml/m LA Vol (A4C): 86.0 ml 35.46 ml/m  AORTIC VALVE AV Area (Vmax):    2.31 cm AV Area (Vmean):   2.32 cm AV Area (VTI):     1.83 cm AV Vmax:           141.17 cm/s AV Vmean:          90.030 cm/s AV VTI:            0.295 m AV Peak Grad:      8.0 mmHg AV Mean Grad:      3.9 mmHg LVOT Vmax:         94.20 cm/s LVOT Vmean:        60.400 cm/s LVOT VTI:          0.156 m LVOT/AV VTI ratio: 0.53  AORTA Ao Root diam: 2.80 cm MITRAL VALVE                TRICUSPID VALVE MV Area (PHT): 5.06 cm     TR Peak grad:   46.2 mmHg MV Decel Time: 150 msec     TR Vmax:        340.00 cm/s MV E velocity: 115.00 cm/s MV A velocity: 66.70 cm/s   SHUNTS MV E/A ratio:  1.72         Systemic VTI:  0.16 m                             Systemic Diam: 2.10 cm Carlyle Dolly MD Electronically signed by Carlyle Dolly MD Signature Date/Time: 04/17/2021/10:51:32 AM    Final     Microbiology: Recent Results (from the past 240 hour(s))  Resp Panel by RT-PCR (Flu A&B, Covid) Nasopharyngeal Swab     Status: None   Collection Time: 04/16/21  8:50 AM   Specimen: Nasopharyngeal Swab; Nasopharyngeal(NP) swabs in vial transport medium  Result Value Ref Range Status   SARS Coronavirus 2 by RT PCR NEGATIVE NEGATIVE Final    Comment: (NOTE) SARS-CoV-2 target nucleic acids are NOT DETECTED.  The SARS-CoV-2 RNA is generally detectable in upper respiratory specimens during the acute phase of infection. The lowest concentration of SARS-CoV-2  viral copies this assay can detect is 138 copies/mL. A negative result does not preclude SARS-Cov-2 infection and should not be used as the sole basis for treatment or other patient management decisions. A negative result may occur with  improper specimen collection/handling, submission of specimen other than nasopharyngeal swab, presence of viral mutation(s) within the areas targeted by this assay, and inadequate number of viral copies(<138 copies/mL). A negative result must be combined with clinical observations, patient history, and epidemiological information. The expected result is Negative.  Fact Sheet for Patients:  EntrepreneurPulse.com.au  Fact Sheet for Healthcare Providers:  IncredibleEmployment.be  This test is no t yet approved or cleared by the [place] FDA and  has been authorized for detection and/or diagnosis of SARS-CoV-2 by FDA under an Emergency Use Authorization (EUA). This EUA will remain  in effect (meaning this test can be used) for the duration of the COVID-19 declaration under Section 564(b)(1) of the Act, 21 U.S.C.section 360bbb-3(b)(1), unless the authorization is terminated  or revoked sooner.       Influenza A by PCR NEGATIVE NEGATIVE Final   Influenza B by PCR NEGATIVE NEGATIVE Final    Comment: (NOTE) The Xpert Xpress SARS-CoV-2/FLU/RSV plus assay is intended as an aid in the diagnosis of influenza from Nasopharyngeal swab specimens and should not be used as a sole basis for treatment. Nasal washings and aspirates are unacceptable for Xpert Xpress SARS-CoV-2/FLU/RSV testing.  Fact Sheet for Patients: EntrepreneurPulse.com.au  Fact Sheet for Healthcare Providers: IncredibleEmployment.be  This test is not yet approved or cleared by the [place] FDA and has been authorized for detection and/or diagnosis of SARS-CoV-2 by FDA under an Emergency Use Authorization  (EUA). This EUA will remain in effect (meaning this test can be used) for the duration of the COVID-19 declaration under Section 564(b)(1) of the Act, 21 U.S.C. section 360bbb-3(b)(1), unless the authorization is terminated or revoked.  Performed at [facility], [address] [place], [place] [address]      Labs: Basic Metabolic Panel: Recent Labs  Lab 04/18/21 0547 04/19/21 0542 04/20/21 0526 04/21/21 0536 04/22/21 0558  NA 142 138 137 136 136  K 5.3* 5.2* 5.4* 5.2* 4.8  CL 111 107 106 102 100  CO2 24 24 24 25 27   GLUCOSE 105* 108* 115* 116* 116*  BUN 72* 74* 75* 76* 78*  CREATININE 4.46* 4.99* 4.86* 4.96* 5.16*  CALCIUM 8.0* 8.0* 8.2* 8.3* 8.5*  PHOS  --   --   --   --  5.2*   Liver Function Tests: Recent Labs  Lab 04/22/21 0558  ALBUMIN 3.0*   CBC: Recent Labs  Lab 04/16/21 0847 04/22/21 0558  WBC 6.2 4.4  NEUTROABS 4.4  --   HGB 9.9* 8.9*  HCT 31.4* 27.4*  MCV 89.0 86.4  PLT 344 280   BNP (last 3 results) Recent Labs    11/15/20 1338 01/19/21 0958 04/16/21 0847  BNP 308.0* 117.0* 461.0*   CBG: Recent Labs  Lab 04/22/21 0722 04/22/21 1110 04/22/21 1601 04/22/21 2117 04/23/21 0730  GLUCAP 118* 141* 139* 243* 129*   Signed:  Barton Dubois MD.  [facility] 04/23/2021, 8:45 AM

## 2021-04-27 ENCOUNTER — Encounter (HOSPITAL_COMMUNITY): Payer: Commercial Managed Care - PPO | Admitting: Cardiology

## 2021-05-20 ENCOUNTER — Other Ambulatory Visit (HOSPITAL_COMMUNITY)
Admission: RE | Admit: 2021-05-20 | Discharge: 2021-05-20 | Disposition: A | Discharge status: Home / Self Care | Payer: Commercial Managed Care - PPO | Source: Ambulatory Visit | Attending: Nephrology | Admitting: Nephrology

## 2021-05-20 DIAGNOSIS — R808 Other proteinuria: Secondary | ICD-10-CM | POA: Insufficient documentation

## 2021-05-20 DIAGNOSIS — E1122 Type 2 diabetes mellitus with diabetic chronic kidney disease: Secondary | ICD-10-CM | POA: Diagnosis present

## 2021-05-20 DIAGNOSIS — N170 Acute kidney failure with tubular necrosis: Secondary | ICD-10-CM | POA: Insufficient documentation

## 2021-05-20 DIAGNOSIS — R809 Proteinuria, unspecified: Secondary | ICD-10-CM | POA: Diagnosis present

## 2021-05-20 DIAGNOSIS — I5032 Chronic diastolic (congestive) heart failure: Secondary | ICD-10-CM | POA: Insufficient documentation

## 2021-05-20 DIAGNOSIS — E1129 Type 2 diabetes mellitus with other diabetic kidney complication: Secondary | ICD-10-CM | POA: Diagnosis present

## 2021-05-20 DIAGNOSIS — D638 Anemia in other chronic diseases classified elsewhere: Secondary | ICD-10-CM | POA: Diagnosis present

## 2021-05-20 DIAGNOSIS — N185 Chronic kidney disease, stage 5: Secondary | ICD-10-CM | POA: Insufficient documentation

## 2021-05-20 LAB — HEPATITIS B CORE ANTIBODY, IGM: Hep B C IgM: NONREACTIVE

## 2021-05-20 LAB — RENAL FUNCTION PANEL
Albumin: 3.1 g/dL — ABNORMAL LOW (ref 3.5–5.0)
Anion gap: 5 (ref 5–15)
BUN: 65 mg/dL — ABNORMAL HIGH (ref 6–20)
CO2: 24 mmol/L (ref 22–32)
Calcium: 8.1 mg/dL — ABNORMAL LOW (ref 8.9–10.3)
Chloride: 107 mmol/L (ref 98–111)
Creatinine, Ser: 3.97 mg/dL — ABNORMAL HIGH (ref 0.61–1.24)
GFR, Estimated: 17 mL/min — ABNORMAL LOW (ref >60)
Glucose, Bld: 110 mg/dL — ABNORMAL HIGH (ref 70–99)
Phosphorus: 3.8 mg/dL (ref 2.5–4.6)
Potassium: 5.3 mmol/L — ABNORMAL HIGH (ref 3.5–5.1)
Sodium: 136 mmol/L (ref 135–145)

## 2021-05-20 LAB — IRON AND TIBC
Iron: 38 ug/dL — ABNORMAL LOW (ref 45–182)
Saturation Ratios: 13 % — ABNORMAL LOW (ref 17.9–39.5)
TIBC: 287 ug/dL (ref 250–450)
UIBC: 249 ug/dL

## 2021-05-20 LAB — CBC
HCT: 31.7 % — ABNORMAL LOW (ref 39.0–52.0)
Hemoglobin: 10.2 g/dL — ABNORMAL LOW (ref 13.0–17.0)
MCH: 28.1 pg (ref 26.0–34.0)
MCHC: 32.2 g/dL (ref 30.0–36.0)
MCV: 87.3 fL (ref 80.0–100.0)
Platelets: 322 10*3/uL (ref 150–400)
RBC: 3.63 MIL/uL — ABNORMAL LOW (ref 4.22–5.81)
RDW: 13.5 % (ref 11.5–15.5)
WBC: 4.9 10*3/uL (ref 4.0–10.5)
nRBC: 0 % (ref 0.0–0.2)

## 2021-05-20 LAB — FERRITIN: Ferritin: 106 ng/mL (ref 24–336)

## 2021-05-20 LAB — VITAMIN D 25 HYDROXY (VIT D DEFICIENCY, FRACTURES): Vit D, 25-Hydroxy: 14.37 ng/mL — ABNORMAL LOW (ref 30–100)

## 2021-05-20 LAB — PROTEIN / CREATININE RATIO, URINE
Creatinine, Urine: 68.06 mg/dL
Protein Creatinine Ratio: 3.11 mg/mg{Cre} — ABNORMAL HIGH (ref 0.00–0.15)
Total Protein, Urine: 212 mg/dL

## 2021-05-20 LAB — HEPATITIS C ANTIBODY: HCV Ab: NONREACTIVE

## 2021-05-20 LAB — HEPATITIS B SURFACE ANTIGEN: Hepatitis B Surface Ag: NONREACTIVE

## 2021-05-21 LAB — PTH, INTACT AND CALCIUM
Calcium, Total (PTH): 8.4 mg/dL — ABNORMAL LOW (ref 8.7–10.2)
PTH: 78 pg/mL — ABNORMAL HIGH (ref 15–65)

## 2021-05-21 LAB — HEPATITIS B SURFACE ANTIBODY, QUANTITATIVE: Hep B S AB Quant (Post): <3.1 m[IU]/mL — ABNORMAL LOW (ref >9.9)

## 2021-05-30 LAB — QUANTIFERON-TB GOLD PLUS (RQFGPL)
QuantiFERON Mitogen Value: 7.42 IU/mL
QuantiFERON Nil Value: 0.08 IU/mL
QuantiFERON TB1 Ag Value: 0.19 IU/mL
QuantiFERON TB2 Ag Value: 0.12 IU/mL

## 2021-05-30 LAB — QUANTIFERON-TB GOLD PLUS: QuantiFERON-TB Gold Plus: NEGATIVE

## 2021-06-08 ENCOUNTER — Other Ambulatory Visit (HOSPITAL_COMMUNITY)
Admission: RE | Admit: 2021-06-08 | Discharge: 2021-06-08 | Disposition: A | Discharge status: Home / Self Care | Payer: 59 | Source: Ambulatory Visit | Attending: Nephrology | Admitting: Nephrology

## 2021-06-08 ENCOUNTER — Other Ambulatory Visit: Payer: Self-pay

## 2021-06-08 DIAGNOSIS — I5032 Chronic diastolic (congestive) heart failure: Secondary | ICD-10-CM | POA: Insufficient documentation

## 2021-06-08 DIAGNOSIS — I129 Hypertensive chronic kidney disease with stage 1 through stage 4 chronic kidney disease, or unspecified chronic kidney disease: Secondary | ICD-10-CM | POA: Diagnosis present

## 2021-06-08 DIAGNOSIS — N170 Acute kidney failure with tubular necrosis: Secondary | ICD-10-CM | POA: Diagnosis present

## 2021-06-08 DIAGNOSIS — D638 Anemia in other chronic diseases classified elsewhere: Secondary | ICD-10-CM | POA: Diagnosis present

## 2021-06-08 DIAGNOSIS — R809 Proteinuria, unspecified: Secondary | ICD-10-CM | POA: Insufficient documentation

## 2021-06-08 DIAGNOSIS — E119 Type 2 diabetes mellitus without complications: Secondary | ICD-10-CM | POA: Insufficient documentation

## 2021-06-08 DIAGNOSIS — N185 Chronic kidney disease, stage 5: Secondary | ICD-10-CM | POA: Diagnosis present

## 2021-06-08 DIAGNOSIS — R808 Other proteinuria: Secondary | ICD-10-CM | POA: Diagnosis present

## 2021-06-08 DIAGNOSIS — E1122 Type 2 diabetes mellitus with diabetic chronic kidney disease: Secondary | ICD-10-CM | POA: Diagnosis present

## 2021-06-08 LAB — CBC
HCT: 32.8 % — ABNORMAL LOW (ref 39.0–52.0)
Hemoglobin: 10.8 g/dL — ABNORMAL LOW (ref 13.0–17.0)
MCH: 28.2 pg (ref 26.0–34.0)
MCHC: 32.9 g/dL (ref 30.0–36.0)
MCV: 85.6 fL (ref 80.0–100.0)
Platelets: 289 10*3/uL (ref 150–400)
RBC: 3.83 MIL/uL — ABNORMAL LOW (ref 4.22–5.81)
RDW: 13.5 % (ref 11.5–15.5)
WBC: 4.7 10*3/uL (ref 4.0–10.5)
nRBC: 0 % (ref 0.0–0.2)

## 2021-06-08 LAB — RENAL FUNCTION PANEL
Albumin: 3.2 g/dL — ABNORMAL LOW (ref 3.5–5.0)
Anion gap: 7 (ref 5–15)
BUN: 64 mg/dL — ABNORMAL HIGH (ref 6–20)
CO2: 23 mmol/L (ref 22–32)
Calcium: 8.4 mg/dL — ABNORMAL LOW (ref 8.9–10.3)
Chloride: 109 mmol/L (ref 98–111)
Creatinine, Ser: 3.74 mg/dL — ABNORMAL HIGH (ref 0.61–1.24)
GFR, Estimated: 18 mL/min — ABNORMAL LOW (ref >60)
Glucose, Bld: 128 mg/dL — ABNORMAL HIGH (ref 70–99)
Phosphorus: 4.4 mg/dL (ref 2.5–4.6)
Potassium: 4.9 mmol/L (ref 3.5–5.1)
Sodium: 139 mmol/L (ref 135–145)

## 2021-06-08 LAB — HEPATITIS B SURFACE ANTIBODY,QUALITATIVE: Hep B S Ab: NONREACTIVE

## 2021-06-08 LAB — IRON AND TIBC
Iron: 56 ug/dL (ref 45–182)
Saturation Ratios: 19 % (ref 17.9–39.5)
TIBC: 297 ug/dL (ref 250–450)
UIBC: 241 ug/dL

## 2021-06-08 LAB — HEPATITIS B CORE ANTIBODY, TOTAL: Hep B Core Total Ab: NONREACTIVE

## 2021-06-08 LAB — PROTEIN / CREATININE RATIO, URINE
Creatinine, Urine: 78.43 mg/dL
Protein Creatinine Ratio: 4.05 mg/mg{Cre} — ABNORMAL HIGH (ref 0.00–0.15)
Total Protein, Urine: 318 mg/dL

## 2021-06-08 LAB — HEPATITIS C ANTIBODY: HCV Ab: NONREACTIVE

## 2021-06-08 LAB — VITAMIN D 25 HYDROXY (VIT D DEFICIENCY, FRACTURES): Vit D, 25-Hydroxy: 15.02 ng/mL — ABNORMAL LOW (ref 30–100)

## 2021-06-08 LAB — HEPATITIS B SURFACE ANTIGEN: Hepatitis B Surface Ag: NONREACTIVE

## 2021-06-08 LAB — FERRITIN: Ferritin: 112 ng/mL (ref 24–336)

## 2021-06-09 ENCOUNTER — Encounter: Payer: Self-pay | Admitting: Internal Medicine

## 2021-06-09 LAB — PTH, INTACT AND CALCIUM
Calcium, Total (PTH): 8.2 mg/dL — ABNORMAL LOW (ref 8.7–10.2)
PTH: 68 pg/mL — ABNORMAL HIGH (ref 15–65)

## 2021-06-13 LAB — QUANTIFERON-TB GOLD PLUS (RQFGPL)
QuantiFERON Mitogen Value: >10 IU/mL
QuantiFERON Nil Value: 0.02 IU/mL
QuantiFERON TB1 Ag Value: 0.05 IU/mL
QuantiFERON TB2 Ag Value: 0.04 IU/mL

## 2021-06-13 LAB — QUANTIFERON-TB GOLD PLUS: QuantiFERON-TB Gold Plus: NEGATIVE

## 2021-06-24 ENCOUNTER — Ambulatory Visit (HOSPITAL_COMMUNITY)
Admission: RE | Admit: 2021-06-24 | Discharge: 2021-06-24 | Disposition: A | Discharge status: Home / Self Care | Payer: 59 | Source: Ambulatory Visit | Attending: Cardiology | Admitting: Cardiology

## 2021-06-24 ENCOUNTER — Telehealth (HOSPITAL_COMMUNITY): Payer: Self-pay

## 2021-06-24 ENCOUNTER — Other Ambulatory Visit: Payer: Self-pay

## 2021-06-24 VITALS — BP 119/70 | HR 82 | Wt 296.8 lb

## 2021-06-24 DIAGNOSIS — R0602 Shortness of breath: Secondary | ICD-10-CM | POA: Insufficient documentation

## 2021-06-24 DIAGNOSIS — I130 Hypertensive heart and chronic kidney disease with heart failure and stage 1 through stage 4 chronic kidney disease, or unspecified chronic kidney disease: Secondary | ICD-10-CM | POA: Insufficient documentation

## 2021-06-24 DIAGNOSIS — E1122 Type 2 diabetes mellitus with diabetic chronic kidney disease: Secondary | ICD-10-CM | POA: Insufficient documentation

## 2021-06-24 DIAGNOSIS — Z7901 Long term (current) use of anticoagulants: Secondary | ICD-10-CM | POA: Insufficient documentation

## 2021-06-24 DIAGNOSIS — G4733 Obstructive sleep apnea (adult) (pediatric): Secondary | ICD-10-CM | POA: Diagnosis not present

## 2021-06-24 DIAGNOSIS — I5032 Chronic diastolic (congestive) heart failure: Secondary | ICD-10-CM

## 2021-06-24 DIAGNOSIS — N184 Chronic kidney disease, stage 4 (severe): Secondary | ICD-10-CM | POA: Diagnosis not present

## 2021-06-24 DIAGNOSIS — R0601 Orthopnea: Secondary | ICD-10-CM | POA: Diagnosis not present

## 2021-06-24 LAB — BASIC METABOLIC PANEL
Anion gap: 7 (ref 5–15)
BUN: 60 mg/dL — ABNORMAL HIGH (ref 6–20)
CO2: 23 mmol/L (ref 22–32)
Calcium: 8.2 mg/dL — ABNORMAL LOW (ref 8.9–10.3)
Chloride: 111 mmol/L (ref 98–111)
Creatinine, Ser: 4.18 mg/dL — ABNORMAL HIGH (ref 0.61–1.24)
GFR, Estimated: 16 mL/min — ABNORMAL LOW (ref >60)
Glucose, Bld: 121 mg/dL — ABNORMAL HIGH (ref 70–99)
Potassium: 5.6 mmol/L — ABNORMAL HIGH (ref 3.5–5.1)
Sodium: 141 mmol/L (ref 135–145)

## 2021-06-24 LAB — BRAIN NATRIURETIC PEPTIDE: B Natriuretic Peptide: 615.1 pg/mL — ABNORMAL HIGH (ref 0.0–100.0)

## 2021-06-24 LAB — CBC
HCT: 30.9 % — ABNORMAL LOW (ref 39.0–52.0)
Hemoglobin: 9.7 g/dL — ABNORMAL LOW (ref 13.0–17.0)
MCH: 26.7 pg (ref 26.0–34.0)
MCHC: 31.4 g/dL (ref 30.0–36.0)
MCV: 85.1 fL (ref 80.0–100.0)
Platelets: 260 10*3/uL (ref 150–400)
RBC: 3.63 MIL/uL — ABNORMAL LOW (ref 4.22–5.81)
RDW: 13.9 % (ref 11.5–15.5)
WBC: 5.5 10*3/uL (ref 4.0–10.5)
nRBC: 0 % (ref 0.0–0.2)

## 2021-06-24 MED ORDER — METOLAZONE 5 MG PO TABS: 5.0000 mg | ORAL_TABLET | Freq: Every day | ORAL | 6 refills | Status: DC

## 2021-06-24 MED ORDER — LOKELMA 5 G PO PACK: 5.0000 g | PACK | Freq: Once | ORAL | 0 refills | Status: AC

## 2021-06-24 MED ORDER — LABETALOL HCL 300 MG PO TABS: 600.0000 mg | ORAL_TABLET | Freq: Two times a day (BID) | ORAL | 6 refills | Status: DC

## 2021-06-24 NOTE — Patient Instructions (Addendum)
Medication Changes:  Stop Carvedilol  Increase Metolazone to 5mg  (1TAB) Monday to Friday. DO NOT TAKE Saturday OR Sunday  Continue Labetalol 600mg  Twice daily   Lab Work:  Labs done today, your results will be available in MyChart, we will contact you for abnormal readings.  BMET in 10days Can get done in [facility]   Testing/Procedures:  None  Referrals:  None  Special Instructions // Education:    Follow-Up in: 3 weeks  At the [facility], you and your health needs are our priority. We have a designated team specialized in the treatment of Heart Failure. This Care Team includes your primary Heart Failure Specialized Cardiologist (physician), Advanced Practice Providers (APPs- Physician Assistants and Nurse Practitioners), and Pharmacist who all work together to provide you with the care you need, when you need it.   You may see any of the following providers on your designated Care Team at your next follow up:  Dr Glori Bickers Dr Haynes Kerns, NP Lyda Jester, [place] [facility] [place], [place] Audry Riles, PharmD   Please be sure to bring in all your medications bottles to every appointment.   Need to Contact us:  If you have any questions or concerns before your next appointment please send [place] a message through [place] or call our office at [phone].    TO LEAVE A MESSAGE FOR THE NURSE SELECT OPTION 2, PLEASE LEAVE A MESSAGE INCLUDING: YOUR NAME DATE OF BIRTH CALL BACK NUMBER REASON FOR CALL**this is important as we prioritize the call backs  YOU WILL RECEIVE A CALL BACK THE SAME DAY AS LONG AS YOU CALL BEFORE 4:00 PM

## 2021-06-24 NOTE — Telephone Encounter (Signed)
-----   Message from Larey Dresser, MD sent at 06/24/2021  3:58 PM EST ----- Stop lisinopril.  Low K diet.  Take 1 dose of Lokelma 5 g x 1.  BMET Monday or Tuesday.

## 2021-06-26 NOTE — Progress Notes (Signed)
PCP: [name], MD Cardiology: Dr. Harl Bowie HF Cardiology: Dr. Aundra Dubin  55 y.o. with history of HTN, type 2 diabetes, CKD stage 3, and chronic diastolic CHF was referred by Dr. Harl Bowie for evaluation of diastolic CHF.  For > 1 year now, patient has had significant exertional dyspnea.  He has been admitted several times in the last year, most recently in 3/22.  Echo in 9/21 showed EF 50%, moderate LVH, grade 2 diastolic dysfunction, normal RV size and systolic function.  Creatinine elevated, he is followed by nephrology.  Echo in 9/22 with EF 55-60%, moderate LVH.   Patient was admitted in 9/22 with CHF exacerbation.   Patient returns for followup of CHF.  Weight is up 14 lbs since last appointment.  He is somewhat unsure about his meds but thinks he is taking metolazone three days/week.  He is taking both labetalol and Coreg.  He is short of breath walking around his house.  He has orthopnea.  He came to the office today in a wheelchair. No chest pain, no lightheadedness.  He has been referred to GI for anemia, he denies BRBPR/melena.     Labs (5/22): K 4.5, creatinine 2.65, myeloma panel negative Labs (6/22): K 4.7, creatinine 4.4, BUN 77, urine immunofixation negative Labs (11/22): K 4.9, creatinine 3.74  ECG (personally reviewed): NSR, LPFB, nonspecific T wave flattening.   PMH; 1. HTN 2. Type 2 diabetes 3. CKD stage 3: Suspect diabetic nephropathy.   4. Chronic diastolic CHF: Echo in 2/33 with EF 50%, moderate LVH, grade 2 diastolic dysfunction, normal RV size and systolic function.  - echo (9/22): EF 55-60%, moderate LVH, RV normal  Social History   Socioeconomic History   Marital status: Married    Spouse name: Not on file   Number of children: Not on file   Years of education: Not on file   Highest education level: Not on file  Occupational History   Not on file  Tobacco Use   Smoking status: Never   Smokeless tobacco: Never  Vaping Use   Vaping Use: Never used   Substance and Sexual Activity   Alcohol use: No   Drug use: No   Sexual activity: Not on file  Other Topics Concern   Not on file  Social History Narrative   Not on file   Social Determinants of Health   Financial Resource Strain: High Risk   Difficulty of Paying Living Expenses: Hard  Food Insecurity: Food Insecurity Present   Worried About Running Out of Food in the Last Year: Sometimes true   Ran Out of Food in the Last Year: Never true  Transportation Needs: No Transportation Needs   Lack of Transportation (Medical): No   Lack of Transportation (Non-Medical): No  Physical Activity: Not on file  Stress: Not on file  Social Connections: Not on file  Intimate Partner Violence: Not on file   Family History  Problem Relation Age of Onset   Dementia Mother    Diabetes Father    ROS: All systems reviewed and negative except as per HPI.   Current Outpatient Medications  Medication Sig Dispense Refill   acetaminophen (TYLENOL) 325 MG tablet Take 2 tablets (650 mg total) by mouth every 6 (six) hours as needed for mild pain (or Fever >/= 101). 12 tablet 1   amLODipine (NORVASC) 10 MG tablet Take 1 tablet (10 mg total) by mouth daily. 90 tablet 3   Garlic Oil 0076 MG CAPS Take by mouth.  glimepiride (AMARYL) 4 MG tablet Take 0.5 tablets (2 mg total) by mouth 2 (two) times daily.     hydrALAZINE (APRESOLINE) 50 MG tablet Take 1.5 tablets (75 mg total) by mouth 3 (three) times daily. 411 tablet 3   lisinopril (ZESTRIL) 10 MG tablet Take 10 mg by mouth daily.     Multiple Vitamin (MULTIVITAMIN WITH MINERALS) TABS tablet Take 1 tablet by mouth daily.     pravastatin (PRAVACHOL) 40 MG tablet Take 40 mg by mouth in the morning.      torsemide (DEMADEX) 20 MG tablet Take 5 tablets (100 mg total) by mouth 2 (two) times daily. 240 tablet 3   Vitamin D, Ergocalciferol, (DRISDOL) 1.25 MG (50000 UNIT) CAPS capsule Take 50,000 Units by mouth every 7 (seven) days.     Zinc 50 MG CAPS Take  by mouth.     labetalol (NORMODYNE) 300 MG tablet Take 2 tablets (600 mg total) by mouth 2 (two) times daily. 60 tablet 6   metolazone (ZAROXOLYN) 5 MG tablet Take 1 tablet (5 mg total) by mouth daily. Take 5mg  (1 TAB) daily Monday to Friday. Skip the weekend 30 tablet 6   No current facility-administered medications for this encounter.   BP 119/70   Pulse 82   Wt 134.6 kg (296 lb 12.8 oz)   SpO2 95%   BMI 41.40 kg/m  General: NAD Neck: JVP 10-12 cm, no thyromegaly or thyroid nodule.  Lungs: Clear to auscultation bilaterally with normal respiratory effort. CV: Nondisplaced PMI.  Heart regular S1/S2, no S3/S4, no murmur.  2+ edema to knees.  No carotid bruit.  Normal pedal pulses.  Abdomen: Soft, nontender, no hepatosplenomegaly, no distention.  Skin: Intact without lesions or rashes.  Neurologic: Alert and oriented x 3.  Psych: Normal affect. Extremities: No clubbing or cyanosis.  HEENT: Normal.   Assessment/Plan: 1. Chronic diastolic CHF: Most recent echo in 9/22 showed EF 56-60%, moderate LVH, grade 2 diastolic dysfunction, normal RV size and systolic function.  Multiple admissions for CHF.  Cardiac amyloidosis (most likely transthyretin) is a concern, myeloma panel negative.  He is still volume overloaded with NYHA class IIIb symptoms, most recent creatinine 3.74.  - Difficult situation with volume overload but worsening renal function.  Continue torsemide 100 mg bid and will increase metolazone to 5 mg 5 days/week (except weekends).  BMET/BNP today and BMET 10 days.  - Cannot use Jardiance with elevated creatinine.  - Workup for cardiac amyloidosis => myeloma panel and urine immunofixation negative.  Now that he has insurance, will arrange for PYP scan to assess for TTR cardiac amyloidosis.  2. CKD stage 4: Creatinine 3.74 most recently.   - BMET today and 10 days.  - Needs to followup with nephrology.   3. OSA: Using CPAP.  4. HTN: BP controlled on current regimen.  - Stop Coreg  as he is also using labetalol 600 mg bid (will continue) - Continue hydralazine.  - Continue lisinopril but check K on BMET today.   Followup in 3 wks with APP.   [name] 06/26/2021

## 2021-06-30 ENCOUNTER — Encounter (HOSPITAL_COMMUNITY): Payer: Self-pay | Admitting: Cardiology

## 2021-07-06 ENCOUNTER — Encounter: Payer: Self-pay | Admitting: Internal Medicine

## 2021-07-06 ENCOUNTER — Ambulatory Visit: Payer: 59 | Admitting: Internal Medicine

## 2021-07-14 NOTE — Progress Notes (Signed)
PCP: [name], MD Cardiology: Dr. Harl Bowie HF Cardiology: Dr. Aundra Dubin  55 y.o. with history of HTN, type 2 diabetes, CKD stage 3, and chronic diastolic CHF was referred by Dr. Harl Bowie for evaluation of diastolic CHF.  For > 1 year now, patient has had significant exertional dyspnea.  He has been admitted several times in the last year, most recently in 3/22.  Echo in 9/21 showed EF 50%, moderate LVH, grade 2 diastolic dysfunction, normal RV size and systolic function.  Creatinine elevated, he is followed by nephrology.  Echo in 9/22 with EF 55-60%, moderate LVH.   Patient was admitted in 9/22 with CHF exacerbation.   Volume overloaded at post hospital follow up 06/24/21. Metolazone increased to 5 mg 5 days a week. Carvedilol stopped as he had been taking this with labetalol. Lisinopril subsequently stopped when labs showed SCr 5.6.  Today he returns for HF follow up with his son. He remains markedly SOB with minimal activity, arrived in wheelchair. He does not know what medications he is taking, and thinks he is still taking lisinopril. He tells me he has not missed his diuretics. He does not have an appetite and feels full. Continues to have swelling in legs. Denies palpitations, CP, dizziness, abnormal bleeding or PND/Orthopnea. No fever or chills. He does not weigh at home.   Labs (5/22): K 4.5, creatinine 2.65, myeloma panel negative Labs (6/22): K 4.7, creatinine 4.4, BUN 77, urine immunofixation negative Labs (11/22): K 4.9, creatinine 3.74 Labs (12/22): K 5.6, creatinine 4.18  ECG (personally reviewed): SR 85 bpm  PMH; 1. HTN 2. Type 2 diabetes 3. CKD stage 3: Suspect diabetic nephropathy.   4. Chronic diastolic CHF: Echo in 5/57 with EF 50%, moderate LVH, grade 2 diastolic dysfunction, normal RV size and systolic function.  - Echo (9/22): EF 55-60%, moderate LVH, RV normal  Social History   Socioeconomic History   Marital status: Married    Spouse name: Not on file   Number  of children: Not on file   Years of education: Not on file   Highest education level: Not on file  Occupational History   Not on file  Tobacco Use   Smoking status: Never   Smokeless tobacco: Never  Vaping Use   Vaping Use: Never used  Substance and Sexual Activity   Alcohol use: No   Drug use: No   Sexual activity: Not on file  Other Topics Concern   Not on file  Social History Narrative   Not on file   Social Determinants of Health   Financial Resource Strain: High Risk   Difficulty of Paying Living Expenses: Hard  Food Insecurity: Food Insecurity Present   Worried About Running Out of Food in the Last Year: Sometimes true   Ran Out of Food in the Last Year: Never true  Transportation Needs: No Transportation Needs   Lack of Transportation (Medical): No   Lack of Transportation (Non-Medical): No  Physical Activity: Not on file  Stress: Not on file  Social Connections: Not on file  Intimate Partner Violence: Not on file   Family History  Problem Relation Age of Onset   Dementia Mother    Diabetes Father    ROS: All systems reviewed and negative except as per HPI.   Current Outpatient Medications  Medication Sig Dispense Refill   acetaminophen (TYLENOL) 325 MG tablet Take 2 tablets (650 mg total) by mouth every 6 (six) hours as needed for mild pain (or Fever >/= 101). 12  tablet 1   Garlic Oil 8127 MG CAPS Take 100 mg by mouth every morning.     glimepiride (AMARYL) 4 MG tablet Take 0.5 tablets (2 mg total) by mouth 2 (two) times daily.     hydrALAZINE (APRESOLINE) 50 MG tablet Take 1.5 tablets (75 mg total) by mouth 3 (three) times daily. 411 tablet 3   labetalol (NORMODYNE) 300 MG tablet Take 2 tablets (600 mg total) by mouth 2 (two) times daily. 60 tablet 6   lisinopril (ZESTRIL) 10 MG tablet Take 10 mg by mouth daily.     metolazone (ZAROXOLYN) 5 MG tablet Take 1 tablet (5 mg total) by mouth daily. Take 5mg  (1 TAB) daily Monday to Friday. Skip the weekend 30 tablet  6   Multiple Vitamin (MULTIVITAMIN WITH MINERALS) TABS tablet Take 1 tablet by mouth daily.     torsemide (DEMADEX) 100 MG tablet Take 100 mg by mouth 2 (two) times daily.     Vitamin D, Ergocalciferol, (DRISDOL) 1.25 MG (50000 UNIT) CAPS capsule Take 50,000 Units by mouth every 7 (seven) days.     Zinc 50 MG CAPS Take 50 mg by mouth every morning.     amLODipine (NORVASC) 10 MG tablet Take 1 tablet (10 mg total) by mouth daily. (Patient not taking: Reported on 07/15/2021) 90 tablet 3   pravastatin (PRAVACHOL) 40 MG tablet Take 40 mg by mouth in the morning.  (Patient not taking: Reported on 07/15/2021)     No current facility-administered medications for this encounter.   BP 140/78    Pulse 84    Ht 5\' 11"  (1.803 m)    Wt (!) 139.8 kg (308 lb 3.2 oz)    SpO2 99%    BMI 42.99 kg/m   Wt Readings from Last 3 Encounters:  07/15/21 (!) 139.8 kg (308 lb 3.2 oz)  06/24/21 134.6 kg (296 lb 12.8 oz)  04/23/21 126.6 kg (279 lb)   General:  SOB speaking sentences, arrived in WC HEENT: + peri-orbital edema Neck: Supple. JVP to ear. Carotids 2+ bilat; no bruits. No lymphadenopathy or thryomegaly appreciated. Cor: PMI nondisplaced. Regular rate & rhythm. No rubs, gallops or murmurs. Lungs: Clear, diminished in bases Abdomen: Obese, nontender, +distended. No hepatosplenomegaly. No bruits or masses. Good bowel sounds. Extremities: No cyanosis, clubbing, rash, 3-4+ BLE to thighs Neuro: Alert & oriented x 3, cranial nerves grossly intact. Moves all 4 extremities w/o difficulty. Affect pleasant.  Assessment/Plan: 1. Acute on Chronic diastolic CHF: Most recent echo in 9/22 showed EF 56-60%, moderate LVH, grade 2 diastolic dysfunction, normal RV size and systolic function.  Multiple admissions for CHF.  Cardiac amyloidosis (most likely transthyretin) is a concern, myeloma panel negative.  He remains markedly overloaded with NYHA class IIIb-IV symptoms, most recent creatinine 4.18.  - He needs admission for  IV diuresis.  - Would stop torsemide and start Lasix 160 mg IV q 6 hours + metolazone 5 mg. - Will need UNNA boots. - Cannot use Jardiance or ARB/ARNi with elevated creatinine.  - Workup for cardiac amyloidosis => myeloma panel and urine immunofixation negative.  Now that he has insurance, PYP scan arranged to assess for TTR cardiac amyloidosis. 2. CKD stage 4: Creatinine 4.18 most recently.  If he fails IV Lasix, will likely need dialysis for volume removal. - He followed with Dr. Theador Hawthorne outpatient. 3. OSA: Using CPAP.  4. HTN: Elevated today, needs diuresis. - Continue labetalol 600 mg bid for now. - Continue hydralazine 75 mg tid.  - Off  lisinopril with hyperkalemia, he is not sure if he is taking this.  Discussed patient with Dr. Haroldine Laws. He is markedly volume overloaded today and will need admission for IV diuresis. Will take him to the ED, RR and charge RN aware of his arrival. Will need Nephrology on board.  CMET, BNP, CBC, TSH, A1c, and Lactic Acid drawn in clinic today, results pending.  [place] FNP 07/15/2021

## 2021-07-15 ENCOUNTER — Ambulatory Visit (HOSPITAL_COMMUNITY)
Admission: RE | Admit: 2021-07-15 | Discharge: 2021-07-15 | Disposition: A | Discharge status: Home / Self Care | Payer: 59 | Source: Ambulatory Visit | Attending: Family Medicine | Admitting: Family Medicine

## 2021-07-15 ENCOUNTER — Encounter (HOSPITAL_COMMUNITY): Payer: 59

## 2021-07-15 ENCOUNTER — Other Ambulatory Visit: Payer: Self-pay

## 2021-07-15 ENCOUNTER — Encounter (HOSPITAL_COMMUNITY): Payer: Self-pay

## 2021-07-15 ENCOUNTER — Encounter (HOSPITAL_COMMUNITY): Payer: Self-pay | Admitting: Internal Medicine

## 2021-07-15 ENCOUNTER — Inpatient Hospital Stay (HOSPITAL_COMMUNITY)
Admission: EM | Admit: 2021-07-15 | Discharge: 2021-08-02 | DRG: 264 | Disposition: A | Discharge status: Home Health | Payer: 59 | Attending: Family Medicine | Admitting: Family Medicine

## 2021-07-15 ENCOUNTER — Emergency Department (HOSPITAL_COMMUNITY): Payer: 59

## 2021-07-15 ENCOUNTER — Encounter (HOSPITAL_COMMUNITY): Payer: Self-pay | Admitting: Emergency Medicine

## 2021-07-15 VITALS — BP 140/78 | HR 84 | Ht 71.0 in | Wt 308.2 lb

## 2021-07-15 DIAGNOSIS — I10 Essential (primary) hypertension: Secondary | ICD-10-CM | POA: Diagnosis present

## 2021-07-15 DIAGNOSIS — R197 Diarrhea, unspecified: Secondary | ICD-10-CM | POA: Diagnosis not present

## 2021-07-15 DIAGNOSIS — Z818 Family history of other mental and behavioral disorders: Secondary | ICD-10-CM | POA: Diagnosis not present

## 2021-07-15 DIAGNOSIS — I509 Heart failure, unspecified: Secondary | ICD-10-CM | POA: Diagnosis present

## 2021-07-15 DIAGNOSIS — I878 Other specified disorders of veins: Secondary | ICD-10-CM | POA: Diagnosis present

## 2021-07-15 DIAGNOSIS — I5033 Acute on chronic diastolic (congestive) heart failure: Secondary | ICD-10-CM

## 2021-07-15 DIAGNOSIS — I493 Ventricular premature depolarization: Secondary | ICD-10-CM | POA: Diagnosis present

## 2021-07-15 DIAGNOSIS — N184 Chronic kidney disease, stage 4 (severe): Secondary | ICD-10-CM

## 2021-07-15 DIAGNOSIS — Z992 Dependence on renal dialysis: Secondary | ICD-10-CM | POA: Diagnosis not present

## 2021-07-15 DIAGNOSIS — J9601 Acute respiratory failure with hypoxia: Secondary | ICD-10-CM | POA: Diagnosis present

## 2021-07-15 DIAGNOSIS — E785 Hyperlipidemia, unspecified: Secondary | ICD-10-CM | POA: Diagnosis present

## 2021-07-15 DIAGNOSIS — E1121 Type 2 diabetes mellitus with diabetic nephropathy: Secondary | ICD-10-CM | POA: Diagnosis present

## 2021-07-15 DIAGNOSIS — N186 End stage renal disease: Secondary | ICD-10-CM | POA: Diagnosis not present

## 2021-07-15 DIAGNOSIS — E1165 Type 2 diabetes mellitus with hyperglycemia: Secondary | ICD-10-CM | POA: Diagnosis present

## 2021-07-15 DIAGNOSIS — Z6841 Body Mass Index (BMI) 40.0 and over, adult: Secondary | ICD-10-CM | POA: Diagnosis not present

## 2021-07-15 DIAGNOSIS — Z7901 Long term (current) use of anticoagulants: Secondary | ICD-10-CM | POA: Insufficient documentation

## 2021-07-15 DIAGNOSIS — S81802D Unspecified open wound, left lower leg, subsequent encounter: Secondary | ICD-10-CM

## 2021-07-15 DIAGNOSIS — E875 Hyperkalemia: Secondary | ICD-10-CM | POA: Diagnosis present

## 2021-07-15 DIAGNOSIS — X58XXXD Exposure to other specified factors, subsequent encounter: Secondary | ICD-10-CM | POA: Diagnosis present

## 2021-07-15 DIAGNOSIS — E611 Iron deficiency: Secondary | ICD-10-CM | POA: Diagnosis present

## 2021-07-15 DIAGNOSIS — G4733 Obstructive sleep apnea (adult) (pediatric): Secondary | ICD-10-CM | POA: Diagnosis present

## 2021-07-15 DIAGNOSIS — R0602 Shortness of breath: Secondary | ICD-10-CM | POA: Insufficient documentation

## 2021-07-15 DIAGNOSIS — Z20822 Contact with and (suspected) exposure to covid-19: Secondary | ICD-10-CM | POA: Diagnosis present

## 2021-07-15 DIAGNOSIS — Z5320 Procedure and treatment not carried out because of patient's decision for unspecified reasons: Secondary | ICD-10-CM | POA: Diagnosis not present

## 2021-07-15 DIAGNOSIS — D631 Anemia in chronic kidney disease: Secondary | ICD-10-CM | POA: Diagnosis present

## 2021-07-15 DIAGNOSIS — I132 Hypertensive heart and chronic kidney disease with heart failure and with stage 5 chronic kidney disease, or end stage renal disease: Principal | ICD-10-CM | POA: Diagnosis present

## 2021-07-15 DIAGNOSIS — N250 Renal osteodystrophy: Secondary | ICD-10-CM | POA: Diagnosis present

## 2021-07-15 DIAGNOSIS — J9690 Respiratory failure, unspecified, unspecified whether with hypoxia or hypercapnia: Secondary | ICD-10-CM

## 2021-07-15 DIAGNOSIS — M7989 Other specified soft tissue disorders: Secondary | ICD-10-CM | POA: Insufficient documentation

## 2021-07-15 DIAGNOSIS — Z79899 Other long term (current) drug therapy: Secondary | ICD-10-CM | POA: Diagnosis not present

## 2021-07-15 DIAGNOSIS — Z833 Family history of diabetes mellitus: Secondary | ICD-10-CM

## 2021-07-15 DIAGNOSIS — E1122 Type 2 diabetes mellitus with diabetic chronic kidney disease: Secondary | ICD-10-CM | POA: Insufficient documentation

## 2021-07-15 DIAGNOSIS — N179 Acute kidney failure, unspecified: Secondary | ICD-10-CM | POA: Diagnosis not present

## 2021-07-15 DIAGNOSIS — Z793 Long term (current) use of hormonal contraceptives: Secondary | ICD-10-CM

## 2021-07-15 DIAGNOSIS — I130 Hypertensive heart and chronic kidney disease with heart failure and stage 1 through stage 4 chronic kidney disease, or unspecified chronic kidney disease: Secondary | ICD-10-CM | POA: Insufficient documentation

## 2021-07-15 LAB — COMPREHENSIVE METABOLIC PANEL
ALT: 19 U/L (ref 0–44)
AST: 17 U/L (ref 15–41)
Albumin: 2.8 g/dL — ABNORMAL LOW (ref 3.5–5.0)
Alkaline Phosphatase: 78 U/L (ref 38–126)
Anion gap: 9 (ref 5–15)
BUN: 62 mg/dL — ABNORMAL HIGH (ref 6–20)
CO2: 19 mmol/L — ABNORMAL LOW (ref 22–32)
Calcium: 8 mg/dL — ABNORMAL LOW (ref 8.9–10.3)
Chloride: 112 mmol/L — ABNORMAL HIGH (ref 98–111)
Creatinine, Ser: 4.08 mg/dL — ABNORMAL HIGH (ref 0.61–1.24)
GFR, Estimated: 16 mL/min — ABNORMAL LOW (ref >60)
Glucose, Bld: 234 mg/dL — ABNORMAL HIGH (ref 70–99)
Potassium: 5.3 mmol/L — ABNORMAL HIGH (ref 3.5–5.1)
Sodium: 140 mmol/L (ref 135–145)
Total Bilirubin: 0.5 mg/dL (ref 0.3–1.2)
Total Protein: 6 g/dL — ABNORMAL LOW (ref 6.5–8.1)

## 2021-07-15 LAB — LACTIC ACID, PLASMA: Lactic Acid, Venous: 1.4 mmol/L (ref 0.5–1.9)

## 2021-07-15 LAB — BASIC METABOLIC PANEL
Anion gap: 5 (ref 5–15)
BUN: 62 mg/dL — ABNORMAL HIGH (ref 6–20)
CO2: 21 mmol/L — ABNORMAL LOW (ref 22–32)
Calcium: 8 mg/dL — ABNORMAL LOW (ref 8.9–10.3)
Chloride: 113 mmol/L — ABNORMAL HIGH (ref 98–111)
Creatinine, Ser: 4.15 mg/dL — ABNORMAL HIGH (ref 0.61–1.24)
GFR, Estimated: 16 mL/min — ABNORMAL LOW (ref >60)
Glucose, Bld: 164 mg/dL — ABNORMAL HIGH (ref 70–99)
Potassium: 5.4 mmol/L — ABNORMAL HIGH (ref 3.5–5.1)
Sodium: 139 mmol/L (ref 135–145)

## 2021-07-15 LAB — CBC
HCT: 29.9 % — ABNORMAL LOW (ref 39.0–52.0)
HCT: 29.9 % — ABNORMAL LOW (ref 39.0–52.0)
Hemoglobin: 9.4 g/dL — ABNORMAL LOW (ref 13.0–17.0)
Hemoglobin: 9.5 g/dL — ABNORMAL LOW (ref 13.0–17.0)
MCH: 26.7 pg (ref 26.0–34.0)
MCH: 27.3 pg (ref 26.0–34.0)
MCHC: 31.4 g/dL (ref 30.0–36.0)
MCHC: 31.8 g/dL (ref 30.0–36.0)
MCV: 84.9 fL (ref 80.0–100.0)
MCV: 85.9 fL (ref 80.0–100.0)
Platelets: 261 10*3/uL (ref 150–400)
Platelets: 277 10*3/uL (ref 150–400)
RBC: 3.52 MIL/uL — ABNORMAL LOW (ref 4.22–5.81)
RBC: 3.48 MIL/uL — ABNORMAL LOW (ref 4.22–5.81)
RDW: 14.3 % (ref 11.5–15.5)
RDW: 14.4 % (ref 11.5–15.5)
WBC: 4.9 10*3/uL (ref 4.0–10.5)
WBC: 5.2 10*3/uL (ref 4.0–10.5)
nRBC: 0 % (ref 0.0–0.2)
nRBC: 0 % (ref 0.0–0.2)

## 2021-07-15 LAB — HEMOGLOBIN A1C
Hgb A1c MFr Bld: 7.1 % — ABNORMAL HIGH (ref 4.8–5.6)
Mean Plasma Glucose: 157.07 mg/dL

## 2021-07-15 LAB — BRAIN NATRIURETIC PEPTIDE
B Natriuretic Peptide: 638.5 pg/mL — ABNORMAL HIGH (ref 0.0–100.0)
B Natriuretic Peptide: 620 pg/mL — ABNORMAL HIGH (ref 0.0–100.0)

## 2021-07-15 LAB — RESP PANEL BY RT-PCR (FLU A&B, COVID) ARPGX2
Influenza A by PCR: NEGATIVE
Influenza B by PCR: NEGATIVE
SARS Coronavirus 2 by RT PCR: NEGATIVE

## 2021-07-15 LAB — TSH: TSH: 2.229 u[IU]/mL (ref 0.350–4.500)

## 2021-07-15 MED ORDER — AMLODIPINE BESYLATE 10 MG PO TABS
10.0000 mg | ORAL_TABLET | Freq: Every day | ORAL | Status: DC
  Administered 2021-07-16: 10:00:00 10 mg via ORAL
  Filled 2021-07-15: qty 1

## 2021-07-15 MED ORDER — ACETAMINOPHEN 650 MG RE SUPP: 650.0000 mg | Freq: Four times a day (QID) | RECTAL | Status: DC | PRN

## 2021-07-15 MED ORDER — ACETAMINOPHEN 325 MG PO TABS
650.0000 mg | ORAL_TABLET | Freq: Four times a day (QID) | ORAL | Status: DC | PRN
  Administered 2021-07-23: 650 mg via ORAL
  Filled 2021-07-15: qty 2

## 2021-07-15 MED ORDER — HYDRALAZINE HCL 50 MG PO TABS
75.0000 mg | ORAL_TABLET | Freq: Three times a day (TID) | ORAL | Status: DC
  Administered 2021-07-15 – 2021-07-17 (×7): 75 mg via ORAL
  Filled 2021-07-15 (×3): qty 1
  Filled 2021-07-15: qty 3
  Filled 2021-07-15 (×3): qty 1

## 2021-07-15 MED ORDER — FUROSEMIDE 10 MG/ML IJ SOLN
160.0000 mg | Freq: Four times a day (QID) | INTRAVENOUS | Status: DC
  Administered 2021-07-15 – 2021-07-16 (×3): 160 mg via INTRAVENOUS
  Filled 2021-07-15 (×4): qty 16
  Filled 2021-07-15: qty 10
  Filled 2021-07-15: qty 16
  Filled 2021-07-15: qty 10
  Filled 2021-07-15: qty 2

## 2021-07-15 MED ORDER — METOLAZONE 5 MG PO TABS
5.0000 mg | ORAL_TABLET | Freq: Every day | ORAL | Status: DC
  Administered 2021-07-15 – 2021-07-21 (×7): 5 mg via ORAL
  Filled 2021-07-15 (×7): qty 1

## 2021-07-15 MED ORDER — INSULIN ASPART 100 UNIT/ML IJ SOLN
0.0000 [IU] | Freq: Three times a day (TID) | INTRAMUSCULAR | Status: DC
  Administered 2021-07-20 – 2021-07-31 (×13): 1 [IU] via SUBCUTANEOUS
  Administered 2021-08-01: 2 [IU] via SUBCUTANEOUS
  Administered 2021-08-02: 1 [IU] via SUBCUTANEOUS

## 2021-07-15 MED ORDER — LABETALOL HCL 200 MG PO TABS
600.0000 mg | ORAL_TABLET | Freq: Two times a day (BID) | ORAL | Status: DC
  Administered 2021-07-15 – 2021-08-01 (×33): 600 mg via ORAL
  Filled 2021-07-15 (×34): qty 3

## 2021-07-15 MED ORDER — PRAVASTATIN SODIUM 40 MG PO TABS
40.0000 mg | ORAL_TABLET | Freq: Every day | ORAL | Status: DC
  Administered 2021-07-16 – 2021-08-01 (×17): 40 mg via ORAL
  Filled 2021-07-15 (×17): qty 1

## 2021-07-15 MED ORDER — HEPARIN SODIUM (PORCINE) 5000 UNIT/ML IJ SOLN
5000.0000 [IU] | Freq: Three times a day (TID) | INTRAMUSCULAR | Status: DC
  Administered 2021-07-15 – 2021-07-21 (×18): 5000 [IU] via SUBCUTANEOUS
  Filled 2021-07-15 (×18): qty 1

## 2021-07-15 NOTE — ED Provider Notes (Signed)
Emergency Medicine Provider Triage Evaluation Note  [name] , a 55 y.o. male  was evaluated in triage.  Pt complains of worsening shortness of breath, bilateral lower extremity edema, decreased appetite, weakness.  Patient with history of heart failure, reports that he has been compliant with his meds, notes he takes torsemide but does not know what his other medications are.  Patient denies recent changes to diet, but does report feeling easily.  Patient denies recent increase in fluid intake or salt.  Patient denies chest pain, fever, chills, cough, sore throat.  Review of Systems  Positive: As above Negative: As above  Physical Exam  BP 133/79 (BP Location: Right Arm)    Pulse 83    Temp 98.6 F (37 C) (Oral)    Resp 18    SpO2 94%  Gen:   Awake, no distress   Resp:  Normal effort, somewhat distant heart sounds MSK:   Moves extremities without difficulty  Other:  At least 2+ swelling bilateral lower extremities, no redness, tenderness No ttp abdomen  Medical Decision Making  Medically screening exam initiated at 12:59 PM.  Appropriate orders placed.  [name] was informed that the remainder of the evaluation will be completed by another provider, this initial triage assessment does not replace that evaluation, and the importance of remaining in the ED until their evaluation is complete.  Concern for heart failure exacerbation   Dorien Chihuahua 07/15/21 1302    Carmin Muskrat, MD 07/15/21 1414

## 2021-07-15 NOTE — ED Triage Notes (Signed)
Pt sent here by cardiologist for shortness of breath x1 week. Pt has hx of CHF, states he has been compliant w/ meds but has noticed increased swelling in bilateral legs, worsening shob w/ exertion, and has had productive cough.

## 2021-07-15 NOTE — Consult Note (Signed)
Advanced Heart Failure Team Consult Note   Primary Physician: [name], MD PCP-Cardiologist:  Carlyle Dolly, MD HF: Aundra Dubin  Reason for Consultation: Dr. Vanita Panda  HPI:    [name] is seen today for evaluation of decompensated HF at the request of Dr. Vanita Panda   [name] is a 55 y.o. male with HTN, DM2, CKD IV, and chronic diastolic CHF   Patient was admitted in 9/22 with CHF exacerbation.  Echo in 9/22 with EF 55-60%, moderate LVH.    Volume overloaded at post hospital follow up 06/24/21. Metolazone increased to 5 mg 5 days a week. Carvedilol stopped as he had been taking this with labetalol. Lisinopril subsequently stopped when labs showed SCr 5.6.   We saw him in the HF Clinic today with his son. Weight up > 20 pounds. Not responding to uptitration of oral diuretics. SOB at rest and with any activity. + orthopnea and severe LE edema. He does not know what medications he is taking, and thinks he is still taking lisinopril. Says he hasn't missed his diuretics. Not watching diet or weighing himself. No palpitations, syncope or presyncope.  [place]r was up to 4.2> was told by he nephrologist Theador Hawthorne) that he may need HD soon.     Review of Systems: [y] = yes, [ ]  = no   General: Weight gain Blue.Reese ]; Weight loss [ ] ; Anorexia [ ] ; Fatigue [ y]; Fever [ ] ; Chills [ ] ; Weakness [ ]   Cardiac: Chest pain/pressure [ ] ; Resting SOB [ y]; Exertional SOB Blue.Reese ]; Orthopnea Blue.Reese ]; Pedal Edema Blue.Reese ]; Palpitations [ ] ; Syncope [ ] ; Presyncope [ ] ; Paroxysmal nocturnal dyspnea[ ]   Pulmonary: Cough [ y]; Wheezing[ ] ; Hemoptysis[ ] ; Sputum [ ] ; Snoring [ ]   GI: Vomiting[ ] ; Dysphagia[ ] ; Melena[ ] ; Hematochezia [ ] ; Heartburn[ ] ; Abdominal pain [ ] ; Constipation [ ] ; Diarrhea [ ] ; BRBPR [ ]   GU: Hematuria[ ] ; Dysuria [ ] ; Nocturia[ ]   Vascular: Pain in legs with walking [ ] ; Pain in feet with lying flat [ ] ; Non-healing sores [ ] ; Stroke [ ] ; TIA [ ] ; Slurred speech [ ] ;  Neuro: Headaches[  y]; Vertigo[ ] ; Seizures[ ] ; Paresthesias[ ] ;Blurred vision [ ] ; Diplopia [ ] ; Vision changes [ ]   Ortho/Skin: Arthritis Blue.Reese ]; Joint pain [ y]; Muscle pain [ ] ; Joint swelling [ ] ; Back Pain [ ] ; Rash [ ]   Psych: Depression[ ] ; Anxiety[ ]   Heme: Bleeding problems [ ] ; Clotting disorders [ ] ; Anemia [ ]   Endocrine: Diabetes [ y]; Thyroid dysfunction[ ]   Home Medications Prior to Admission medications   Medication Sig Start Date End Date Taking? Authorizing Provider  acetaminophen (TYLENOL) 325 MG tablet Take 2 tablets (650 mg total) by mouth every 6 (six) hours as needed for mild pain (or Fever >/= 101). 10/01/20   Roxan Hockey, MD  amLODipine (NORVASC) 10 MG tablet Take 1 tablet (10 mg total) by mouth daily. Patient not taking: Reported on 07/15/2021 10/01/20   Roxan Hockey, MD  Garlic Oil 7209 MG CAPS Take 100 mg by mouth every morning.    [provider]  glimepiride (AMARYL) 4 MG tablet Take 0.5 tablets (2 mg total) by mouth 2 (two) times daily. 04/23/21   Barton Dubois, MD  hydrALAZINE (APRESOLINE) 50 MG tablet Take 1.5 tablets (75 mg total) by mouth 3 (three) times daily. 02/11/21 07/15/21  [name], MD  labetalol (NORMODYNE) 300 MG tablet Take 2 tablets (600 mg total)  by mouth 2 (two) times daily. 06/24/21   Larey Dresser, MD  lisinopril (ZESTRIL) 10 MG tablet Take 10 mg by mouth daily.    [provider]  metolazone (ZAROXOLYN) 5 MG tablet Take 1 tablet (5 mg total) by mouth daily. Take 5mg  (1 TAB) daily Monday to Friday. Skip the weekend 06/24/21   Larey Dresser, MD  Multiple Vitamin (MULTIVITAMIN WITH MINERALS) TABS tablet Take 1 tablet by mouth daily.    [provider]  pravastatin (PRAVACHOL) 40 MG tablet Take 40 mg by mouth in the morning.  Patient not taking: Reported on 07/15/2021    [provider]  torsemide (DEMADEX) 100 MG tablet Take 100 mg by mouth 2 (two) times daily. 05/13/21   [provider]  Vitamin D,  Ergocalciferol, (DRISDOL) 1.25 MG (50000 UNIT) CAPS capsule Take 50,000 Units by mouth every 7 (seven) days.    [provider]  Zinc 50 MG CAPS Take 50 mg by mouth every morning.    [provider]    Past Medical History: Past Medical History:  Diagnosis Date   CHF (congestive heart failure) ([place])    Diabetes mellitus    Hypertension     Past Surgical History: Past Surgical History:  Procedure Laterality Date   INCISION AND DRAINAGE     KNEE SURGERY      Family History: Family History  Problem Relation Age of Onset   Dementia Mother    Diabetes Father     Social History: Social History   Socioeconomic History   Marital status: Married    Spouse name: Not on file   Number of children: Not on file   Years of education: Not on file   Highest education level: Not on file  Occupational History   Not on file  Tobacco Use   Smoking status: Never   Smokeless tobacco: Never  Vaping Use   Vaping Use: Never used  Substance and Sexual Activity   Alcohol use: No   Drug use: No   Sexual activity: Not on file  Other Topics Concern   Not on file  Social History Narrative   Not on file   Social Determinants of Health   Financial Resource Strain: High Risk   Difficulty of Paying Living Expenses: Hard  Food Insecurity: Food Insecurity Present   Worried About Running Out of Food in the Last Year: Sometimes true   Ran Out of Food in the Last Year: Never true  Transportation Needs: No Transportation Needs   Lack of Transportation (Medical): No   Lack of Transportation (Non-Medical): No  Physical Activity: Not on file  Stress: Not on file  Social Connections: Not on file    Allergies:  No Known Allergies  Objective:    Vital Signs:   Temp:  [98.4 F (36.9 C)-98.6 F (37 C)] 98.4 F (36.9 C) (12/23 1409) Pulse Rate:  [77-88] 77 (12/23 1537) Resp:  [17-20] 20 (12/23 1537) BP: (128-176)/(72-82) 128/72 (12/23 1537) SpO2:  [94 %-99 %] 95 % (12/23  1537) Weight:  [139.8 kg] 139.8 kg (12/23 1030)    Weight change: There were no vitals filed for this visit.  Intake/Output:  No intake or output data in the 24 hours ending 07/15/21 1641    Physical Exam    General:  Obese male sitting in WC Mild SOB with talking HEENT: normal Neck: supple. JVP to ear . Carotids 2+ bilat; no bruits. No lymphadenopathy or thyromegaly appreciated. Cor: PMI nondisplaced. Regular  rate & rhythm. No rubs, gallops or murmurs. Lungs: clear Abdomen: obese soft, nontender, +distended. No hepatosplenomegaly. No bruits or masses. Good bowel sounds. Extremities: no cyanosis, clubbing, rash, 3+ edema into thighs  Neuro: alert & orientedx3, cranial nerves grossly intact. moves all 4 extremities w/o difficulty. Affect pleasant   EKG    SR 83 non-specific ST-T wave abnormality. LPFB Personally reviewed  Labs   Basic Metabolic Panel: Recent Labs  Lab 07/15/21 1130 07/15/21 1259  NA 140 139  K 5.3* 5.4*  CL 112* 113*  CO2 19* 21*  GLUCOSE 234* 164*  BUN 62* 62*  CREATININE 4.08* 4.15*  CALCIUM 8.0* 8.0*    Liver Function Tests: Recent Labs  Lab 07/15/21 1130  AST 17  ALT 19  ALKPHOS 78  BILITOT 0.5  PROT 6.0*  ALBUMIN 2.8*   No results for input(s): LIPASE, AMYLASE in the last 168 hours. No results for input(s): AMMONIA in the last 168 hours.  CBC: Recent Labs  Lab 07/15/21 1130 07/15/21 1259  WBC 4.9 5.2  HGB 9.4* 9.5*  HCT 29.9* 29.9*  MCV 84.9 85.9  PLT 261 277    Cardiac Enzymes: No results for input(s): CKTOTAL, CKMB, CKMBINDEX, TROPONINI in the last 168 hours.  BNP: BNP (last 3 results) Recent Labs    06/24/21 1250 07/15/21 1130 07/15/21 1259  BNP 615.1* 638.5* 620.0*    ProBNP (last 3 results) No results for input(s): PROBNP in the last 8760 hours.   CBG: No results for input(s): GLUCAP in the last 168 hours.  Coagulation Studies: No results for input(s): LABPROT, INR in the last 72 hours.   Imaging    DG Chest 2 View  Result Date: 07/15/2021 CLINICAL DATA:  Provided history: Shortness of breath. Additional history provided: Patient with history of hypertension, type 2 diabetes, CKD stage 3 and chronic diastolic CHF. Exertional dyspnea. EXAM: CHEST - 2 VIEW COMPARISON:  Prior chest radiographs 04/16/2021 and earlier. FINDINGS: Shallow inspiration radiograph. Cardiomegaly, unchanged. Central pulmonary vascular congestion and interstitial edema. Similar to the prior examination of 04/16/2021, there is a small to moderate right pleural effusion partly tracking into the minor fissure. Associated right basilar atelectasis and/or consolidation. Suspected trace left pleural effusion. No evidence of pneumothorax. No acute bony abnormality identified. IMPRESSION: Cardiomegaly, unchanged. Central pulmonary vascular congestion and interstitial edema. Similar to the prior examination of 04/16/2021, there is a small-to-moderate right pleural effusion partly tracking into the minor fissure. Associated right basilar atelectasis and/or consolidation. Suspected trace left pleural effusion. Electronically Signed   By: Kellie Simmering D.O.   On: 07/15/2021 13:33    Assessment/Plan   1. Acute on Chronic diastolic CHF: Most recent echo in 9/22 showed EF 56-60%, moderate LVH, grade 2 diastolic dysfunction, normal RV size and systolic function.  Multiple admissions for CHF.  Cardiac amyloidosis (most likely transthyretin) is a concern, myeloma panel negative. He remains markedly overloaded with NYHA class IIIb-IV symptoms, most recent creatinine 4.18.  - He needs admission to IM for IV diuresis. - Would stop torsemide and start Lasix 160 mg IV q 6 hours + metolazone 5 mg daily If no response will need initiation of HD - Place UNNA boots. - Cannot use Jardiance or ARB/ARNi with elevated creatinine.  - Now that he has insurance, PYP scan arranged to assess for TTR cardiac amyloidosis.   2. CKD stage 4: Creatinine 4.18 most  recently.  If he fails IV Lasix, will likely need dialysis for volume removal. - He followed with Dr.  [facility] outpatient.  3. OSA: Using CPAP.   4. HTN: Elevated today, needs diuresis. - Continue labetalol 600 mg bid for now. - Continue hydralazine 75 mg tid.  - Off lisinopril with hyperkalemia, he is not sure if he is taking this.  5. DM2 - continue with home regimen - cover with SSI   Length of Stay: 0  Glori Bickers, MD  07/15/2021, 4:41 PM  Advanced Heart Failure Team Pager [phone] (M-F; 7a - 5p)  Please contact [facility] for night-coverage after hours (4p -7a ) and weekends on [email or web address] Dr

## 2021-07-15 NOTE — H&P (Signed)
History and Physical    [name] MVE:[id number] DOB: [birth year] DOA: 07/15/2021  PCP: [name], MD  Patient coming from: Home.  Chief Complaint: Edema and shortness of breath.  HPI: [name] is a 55 y.o. male with history of chronic diastolic CHF, chronic kidney disease stage IV, diabetes mellitus, hypertension and anemia was brought to the ER after patient was found to be increasingly short of breath with worsening peripheral edema as noticed by patient's cardiologist in the clinic.  Patient's Lasix dose was recently increased despite which patient has not been diuresing well.  Denies chest pain or productive cough fever chills.  Has gained more than 20 pounds in the last few weeks.  ED Course: In the ER patient noted distress has bilateral lower extremity edema up to the thighs.  Labs show BNP of 620 potassium of 5.4 creatinine 4.1 hemoglobin 9.5 COVID test negative chest x-ray shows congestion and pleural effusion EKG shows normal sinus rhythm.  Patient was started on Lasix 160 mg IV every 6 and on metolazone admitted for further diuresis.  Review of Systems: As per HPI, rest all negative.   Past Medical History:  Diagnosis Date   CHF (congestive heart failure) (HCC)    Diabetes mellitus    Hypertension     Past Surgical History:  Procedure Laterality Date   INCISION AND DRAINAGE     KNEE SURGERY       reports that he has never smoked. He has never used smokeless tobacco. He reports that he does not drink alcohol and does not use drugs.  No Known Allergies  Family History  Problem Relation Age of Onset   Dementia Mother    Diabetes Father     Prior to Admission medications   Medication Sig Start Date End Date Taking? Authorizing Provider  acetaminophen (TYLENOL) 325 MG tablet Take 2 tablets (650 mg total) by mouth every 6 (six) hours as needed for mild pain (or Fever >/= 101). 10/01/20  Yes Emokpae, Courage, MD  amLODipine (NORVASC) 10 MG tablet  Take 1 tablet (10 mg total) by mouth daily. 10/01/20  Yes Emokpae, Courage, MD  Garlic Oil 2836 MG CAPS Take 100 mg by mouth every morning.   Yes [provider]  glimepiride (AMARYL) 4 MG tablet Take 0.5 tablets (2 mg total) by mouth 2 (two) times daily. 04/23/21  Yes Barton Dubois, MD  hydrALAZINE (APRESOLINE) 50 MG tablet Take 1.5 tablets (75 mg total) by mouth 3 (three) times daily. 02/11/21 07/15/21 Yes Branch, Alphonse Guild, MD  labetalol (NORMODYNE) 300 MG tablet Take 2 tablets (600 mg total) by mouth 2 (two) times daily. 06/24/21  Yes Larey Dresser, MD  lisinopril (ZESTRIL) 10 MG tablet Take 10 mg by mouth daily.   Yes [provider]  metolazone (ZAROXOLYN) 5 MG tablet Take 1 tablet (5 mg total) by mouth daily. Take 5mg  (1 TAB) daily Monday to Friday. Skip the weekend 06/24/21  Yes Larey Dresser, MD  Multiple Vitamin (MULTIVITAMIN WITH MINERALS) TABS tablet Take 1 tablet by mouth daily.   Yes [provider]  pravastatin (PRAVACHOL) 40 MG tablet Take 40 mg by mouth in the morning.   Yes [provider]  torsemide (DEMADEX) 100 MG tablet Take 100 mg by mouth 2 (two) times daily. 05/13/21  Yes [provider]  Vitamin D, Ergocalciferol, (DRISDOL) 1.25 MG (50000 UNIT) CAPS capsule Take 50,000 Units by mouth every Friday.   Yes [provider]  Zinc  50 MG CAPS Take 50 mg by mouth every morning.   Yes [provider]    Physical Exam: Constitutional: Moderately built and nourished. Vitals:   07/15/21 2015 07/15/21 2030 07/15/21 2045 07/15/21 2100  BP: (!) 162/93 (!) 162/91 (!) 166/93 (!) 157/88  Pulse: 84 81 81 82  Resp: (!) 28 (!) 25 20 (!) 23  Temp:      TempSrc:      SpO2: 93% 95% (!) 89% 96%  Weight:      Height:       Eyes: Anicteric no pallor. ENMT: No discharge from the ears eyes nose and mouth. Neck: JVD elevated no mass felt. Respiratory: No rhonchi or crepitations. Cardiovascular: S1-S2 heard. Abdomen: Soft  nontender bowel sound present. Musculoskeletal: Bilateral lower extremity edema extending up to thighs. Skin: Chronic skin changes with some blisters. Neurologic: Alert awake oriented time place and person.  Moves all extremities. Psychiatric: Appears normal.  Normal affect.   Labs on Admission: I have personally reviewed following labs and imaging studies  CBC: Recent Labs  Lab 07/15/21 1130 07/15/21 1259  WBC 4.9 5.2  HGB 9.4* 9.5*  HCT 29.9* 29.9*  MCV 84.9 85.9  PLT 261 921   Basic Metabolic Panel: Recent Labs  Lab 07/15/21 1130 07/15/21 1259  NA 140 139  K 5.3* 5.4*  CL 112* 113*  CO2 19* 21*  GLUCOSE 234* 164*  BUN 62* 62*  CREATININE 4.08* 4.15*  CALCIUM 8.0* 8.0*   GFR: Estimated Creatinine Clearance: 28.4 mL/min (A) (by C-G formula based on SCr of 4.15 mg/dL (H)). Liver Function Tests: Recent Labs  Lab 07/15/21 1130  AST 17  ALT 19  ALKPHOS 78  BILITOT 0.5  PROT 6.0*  ALBUMIN 2.8*   No results for input(s): LIPASE, AMYLASE in the last 168 hours. No results for input(s): AMMONIA in the last 168 hours. Coagulation Profile: No results for input(s): INR, PROTIME in the last 168 hours. Cardiac Enzymes: No results for input(s): CKTOTAL, CKMB, CKMBINDEX, TROPONINI in the last 168 hours. BNP (last 3 results) No results for input(s): PROBNP in the last 8760 hours. HbA1C: Recent Labs    07/15/21 1130  HGBA1C 7.1*   CBG: No results for input(s): GLUCAP in the last 168 hours. Lipid Profile: No results for input(s): CHOL, HDL, LDLCALC, TRIG, CHOLHDL, LDLDIRECT in the last 72 hours. Thyroid Function Tests: Recent Labs    07/15/21 1130  TSH 2.229   Anemia Panel: No results for input(s): VITAMINB12, FOLATE, FERRITIN, TIBC, IRON, RETICCTPCT in the last 72 hours. Urine analysis:    Component Value Date/Time   COLORURINE YELLOW 06/14/2020 [place] 06/14/2020 1452   LABSPEC 1.010 06/14/2020 1452   PHURINE 5.0 06/14/2020 1452    GLUCOSEU NEGATIVE 06/14/2020 1452   HGBUR NEGATIVE 06/14/2020 1452   BILIRUBINUR NEGATIVE 06/14/2020 1452   KETONESUR NEGATIVE 06/14/2020 1452   PROTEINUR 100 (A) 06/14/2020 1452   NITRITE NEGATIVE 06/14/2020 1452   LEUKOCYTESUR NEGATIVE 06/14/2020 1452   Sepsis Labs: @LABRCNTIP (procalcitonin:4,lacticidven:4) ) Recent Results (from the past 240 hour(s))  Resp Panel by RT-PCR (Flu A&B, Covid) Nasopharyngeal Swab     Status: None   Collection Time: 07/15/21  1:00 PM   Specimen: Nasopharyngeal Swab; Nasopharyngeal(NP) swabs in vial transport medium  Result Value Ref Range Status   SARS Coronavirus 2 by RT PCR NEGATIVE NEGATIVE Final    Comment: (NOTE) SARS-CoV-2 target nucleic acids are NOT DETECTED.  The SARS-CoV-2 RNA is generally detectable in upper respiratory  specimens during the acute phase of infection. The lowest concentration of SARS-CoV-2 viral copies this assay can detect is 138 copies/mL. A negative result does not preclude SARS-Cov-2 infection and should not be used as the sole basis for treatment or other patient management decisions. A negative result may occur with  improper specimen collection/handling, submission of specimen other than nasopharyngeal swab, presence of viral mutation(s) within the areas targeted by this assay, and inadequate number of viral copies(<138 copies/mL). A negative result must be combined with clinical observations, patient history, and epidemiological information. The expected result is Negative.  Fact Sheet for Patients:  EntrepreneurPulse.com.au  Fact Sheet for Healthcare Providers:  IncredibleEmployment.be  This test is no t yet approved or cleared by the [place] FDA and  has been authorized for detection and/or diagnosis of SARS-CoV-2 by FDA under an Emergency Use Authorization (EUA). This EUA will remain  in effect (meaning this test can be used) for the duration of the COVID-19  declaration under Section 564(b)(1) of the Act, 21 U.S.C.section 360bbb-3(b)(1), unless the authorization is terminated  or revoked sooner.       Influenza A by PCR NEGATIVE NEGATIVE Final   Influenza B by PCR NEGATIVE NEGATIVE Final    Comment: (NOTE) The Xpert Xpress SARS-CoV-2/FLU/RSV plus assay is intended as an aid in the diagnosis of influenza from Nasopharyngeal swab specimens and should not be used as a sole basis for treatment. Nasal washings and aspirates are unacceptable for Xpert Xpress SARS-CoV-2/FLU/RSV testing.  Fact Sheet for Patients: EntrepreneurPulse.com.au  Fact Sheet for Healthcare Providers: IncredibleEmployment.be  This test is not yet approved or cleared by the [place] FDA and has been authorized for detection and/or diagnosis of SARS-CoV-2 by FDA under an Emergency Use Authorization (EUA). This EUA will remain in effect (meaning this test can be used) for the duration of the COVID-19 declaration under Section 564(b)(1) of the Act, 21 U.S.C. section 360bbb-3(b)(1), unless the authorization is terminated or revoked.  Performed at [facility], [place] [address], [place], [place] [address]      Radiological Exams on Admission: DG Chest 2 View  Result Date: 07/15/2021 CLINICAL DATA:  Provided history: Shortness of breath. Additional history provided: Patient with history of hypertension, type 2 diabetes, CKD stage 3 and chronic diastolic CHF. Exertional dyspnea. EXAM: CHEST - 2 VIEW COMPARISON:  Prior chest radiographs 04/16/2021 and earlier. FINDINGS: Shallow inspiration radiograph. Cardiomegaly, unchanged. Central pulmonary vascular congestion and interstitial edema. Similar to the prior examination of 04/16/2021, there is a small to moderate right pleural effusion partly tracking into the minor fissure. Associated right basilar atelectasis and/or consolidation. Suspected trace left pleural effusion. No evidence  of pneumothorax. No acute bony abnormality identified. IMPRESSION: Cardiomegaly, unchanged. Central pulmonary vascular congestion and interstitial edema. Similar to the prior examination of 04/16/2021, there is a small-to-moderate right pleural effusion partly tracking into the minor fissure. Associated right basilar atelectasis and/or consolidation. Suspected trace left pleural effusion. Electronically Signed   By: Kellie Simmering D.O.   On: 07/15/2021 13:33    EKG: Independently reviewed.  Normal sinus rhythm.  Assessment/Plan Principal Problem:   Acute on chronic diastolic CHF (congestive heart failure) (HCC) Active Problems:   Essential hypertension   Uncontrolled type 2 diabetes mellitus with hyperglycemia (HCC)   CKD (chronic kidney disease) stage 4, GFR 15-29 ml/min (HCC)   Acute exacerbation of CHF (congestive heart failure) ([place])    Acute on chronic diastolic CHF last EF measured in September 2022 was 55 to  60% with grade 2 diastolic dysfunction.  Appreciate cardiology consult.  Patient is on Lasix 160 mg every 6 hourly.  Patient is also on metolazone.  If diuresis does not help then may need dialysis. Chronic kidney disease stage IV creatinine is at baseline.  Does have mild hyperkalemia.  Avoiding any ARB or ACE inhibitor's.  Patient is on Lasix 160 mg IV every 6.  May need dialysis if patient does not improve with diuretics. Hypertension controlled on labetalol and hydralazine.  Follow blood pressure trends. Diabetes mellitus type 2 on sliding scale coverage. Chronic anemia likely from renal disease follow CBC. Sleep apnea on CPAP not sure of his compliance.  Since patient has significant peripheral edema with CHF symptoms will need aggressive diuresis and close management and follow-up and inpatient status.   DVT prophylaxis: Heparin. Code Status: Full code. Family Communication: Discussed with patient. Disposition Plan: Home. Consults called: Cardiology. Admission status:  Inpatient.   Rise Patience MD [facility] Pager [phone].  If 7PM-7AM, please contact night-coverage [email or web address] Password [facility]  07/15/2021, 10:05 PM

## 2021-07-15 NOTE — ED Provider Notes (Signed)
[facility] EMERGENCY DEPARTMENT Provider Note   CSN: [id number] Arrival date & time: 07/15/21  1145     History Chief Complaint  Patient presents with   Shortness of [place] is a 55 y.o. male.  Presenting to the ER with concern for shortness of breath.  Patient has history of hypertension, diabetes, CKD, diastolic heart failure.  Was admitted in September for heart failure exacerbation.  Had follow-up a couple weeks ago with cardiology, metolazone increased.  Patient states that he has been having steadily worsening shortness of breath and leg swelling.  Also endorses weight gain.  Denies missing any doses of his diuretic therapy.  Cardiology evaluated patient this morning and recommended he be admitted for more aggressive IV diuresis.  Additional history obtained from chart review, review of recent discharge summary and cardiology clinic notes.  HPI     Past Medical History:  Diagnosis Date   CHF (congestive heart failure) ([place])    Diabetes mellitus    Hypertension     Patient Active Problem List   Diagnosis Date Noted   Acute exacerbation of CHF (congestive heart failure) ([place]) 07/15/2021   Class 2 obesity due to excess calories with body mass index (BMI) of 38.0 to 38.9 in adult    Acute renal failure superimposed on stage 4 chronic kidney disease ([place]) 04/18/2021   CKD (chronic kidney disease) stage 4, GFR 15-29 ml/min ([place]) 04/16/2021   Acute respiratory failure with hypoxia (HCC)    Pleural effusion on right 10/01/2020   Uncontrolled type 2 diabetes mellitus with hyperglycemia ([place]) 06/16/2020   Community acquired pneumonia of right lower lobe of lung    CHF exacerbation ([place]) 06/14/2020   Protein calorie malnutrition ([place]) 06/14/2020   CKD (chronic kidney disease) stage 3, GFR 30-59 ml/min ([place]) 05/03/2020   Hypertensive urgency 05/03/2020   Blurred vision, bilateral 05/03/2020   Acute on chronic diastolic (congestive) heart  failure ([place]) 05/03/2020   Essential hypertension    Type 2 diabetes with nephropathy (HCC)    Acute on chronic diastolic CHF (congestive heart failure) ([place])    Acute renal failure superimposed on stage 3a chronic kidney disease ([place])    Class 2 obesity    Bilateral leg edema 04/05/2020    Past Surgical History:  Procedure Laterality Date   INCISION AND DRAINAGE     KNEE SURGERY         Family History  Problem Relation Age of Onset   Dementia Mother    Diabetes Father     Social History   Tobacco Use   Smoking status: Never   Smokeless tobacco: Never  Vaping Use   Vaping Use: Never used  Substance Use Topics   Alcohol use: No   Drug use: No    Home Medications Prior to Admission medications   Medication Sig Start Date End Date Taking? Authorizing Provider  acetaminophen (TYLENOL) 325 MG tablet Take 2 tablets (650 mg total) by mouth every 6 (six) hours as needed for mild pain (or Fever >/= 101). 10/01/20  Yes Emokpae, Courage, MD  amLODipine (NORVASC) 10 MG tablet Take 1 tablet (10 mg total) by mouth daily. 10/01/20  Yes Emokpae, Courage, MD  Garlic Oil 4098 MG CAPS Take 100 mg by mouth every morning.   Yes [provider]  glimepiride (AMARYL) 4 MG tablet Take 0.5 tablets (2 mg total) by mouth 2 (two) times daily. 04/23/21  Yes Barton Dubois, MD  hydrALAZINE (APRESOLINE) 50  MG tablet Take 1.5 tablets (75 mg total) by mouth 3 (three) times daily. 02/11/21 07/15/21 Yes Branch, Alphonse Guild, MD  labetalol (NORMODYNE) 300 MG tablet Take 2 tablets (600 mg total) by mouth 2 (two) times daily. 06/24/21  Yes Larey Dresser, MD  lisinopril (ZESTRIL) 10 MG tablet Take 10 mg by mouth daily.   Yes [provider]  metolazone (ZAROXOLYN) 5 MG tablet Take 1 tablet (5 mg total) by mouth daily. Take 5mg  (1 TAB) daily Monday to Friday. Skip the weekend 06/24/21  Yes Larey Dresser, MD  Multiple Vitamin (MULTIVITAMIN WITH MINERALS) TABS tablet Take 1 tablet by mouth daily.    Yes [provider]  pravastatin (PRAVACHOL) 40 MG tablet Take 40 mg by mouth in the morning.   Yes [provider]  torsemide (DEMADEX) 100 MG tablet Take 100 mg by mouth 2 (two) times daily. 05/13/21  Yes [provider]  Vitamin D, Ergocalciferol, (DRISDOL) 1.25 MG (50000 UNIT) CAPS capsule Take 50,000 Units by mouth every Friday.   Yes [provider]  Zinc 50 MG CAPS Take 50 mg by mouth every morning.   Yes [provider]    Allergies    Patient has no known allergies.  Review of Systems   Review of Systems  Constitutional:  Negative for chills and fever.  HENT:  Negative for ear pain and sore throat.   Eyes:  Negative for pain and visual disturbance.  Respiratory:  Positive for shortness of breath. Negative for cough.   Cardiovascular:  Negative for chest pain and palpitations.  Gastrointestinal:  Negative for abdominal pain and vomiting.  Genitourinary:  Negative for dysuria and hematuria.  Musculoskeletal:  Negative for arthralgias and back pain.  Skin:  Negative for color change and rash.  Neurological:  Negative for seizures and syncope.  All other systems reviewed and are negative.  Physical Exam Updated Vital Signs BP (!) 157/88    Pulse 82    Temp 98.4 F (36.9 C) (Oral)    Resp (!) 23    Ht 5\' 11"  (1.803 m)    Wt (!) 139.7 kg    SpO2 96%    BMI 42.96 kg/m   Physical Exam Vitals and nursing note reviewed.  Constitutional:      General: He is not in acute distress.    Appearance: He is well-developed.  HENT:     Head: Normocephalic and atraumatic.  Eyes:     Conjunctiva/sclera: Conjunctivae normal.  Cardiovascular:     Rate and Rhythm: Normal rate and regular rhythm.     Heart sounds: No murmur heard. Pulmonary:     Comments: Mild tachypnea but not in distress and speaks in full sentences Abdominal:     Palpations: Abdomen is soft.     Tenderness: There is no abdominal tenderness.  Musculoskeletal:         General: No swelling.     Cervical back: Neck supple.     Comments: Bilateral pitting edema in both lower legs to level of knee  Skin:    General: Skin is warm and dry.     Capillary Refill: Capillary refill takes less than 2 seconds.  Neurological:     Mental Status: He is alert.  Psychiatric:        Mood and Affect: Mood normal.    ED Results / Procedures / Treatments   Labs (all labs ordered are listed, but only abnormal results are displayed) Labs Reviewed  BASIC METABOLIC PANEL -  Abnormal; Notable for the following components:      Result Value   Potassium 5.4 (*)    Chloride 113 (*)    CO2 21 (*)    Glucose, Bld 164 (*)    BUN 62 (*)    Creatinine, Ser 4.15 (*)    Calcium 8.0 (*)    GFR, Estimated 16 (*)    All other components within normal limits  CBC - Abnormal; Notable for the following components:   RBC 3.48 (*)    Hemoglobin 9.5 (*)    HCT 29.9 (*)    All other components within normal limits  BRAIN NATRIURETIC PEPTIDE - Abnormal; Notable for the following components:   B Natriuretic Peptide 620.0 (*)    All other components within normal limits  RESP PANEL BY RT-PCR (FLU A&B, COVID) ARPGX2  HIV ANTIBODY (ROUTINE TESTING W REFLEX)  CBC  CREATININE, SERUM  MAGNESIUM  TSH    EKG EKG Interpretation  Date/Time:  Friday July 15 2021 12:20:11 EST Ventricular Rate:  83 PR Interval:  180 QRS Duration: 90 QT Interval:  412 QTC Calculation: 484 R Axis:   111 Text Interpretation: Normal sinus rhythm Left posterior fascicular block Nonspecific T wave abnormality Prolonged QT Abnormal ECG Confirmed by Madalyn Rob [phone]) on 07/16/2021 12:09:45 AM  Radiology DG Chest 2 View  Result Date: 07/15/2021 CLINICAL DATA:  Provided history: Shortness of breath. Additional history provided: Patient with history of hypertension, type 2 diabetes, CKD stage 3 and chronic diastolic CHF. Exertional dyspnea. EXAM: CHEST - 2 VIEW COMPARISON:  Prior chest radiographs  04/16/2021 and earlier. FINDINGS: Shallow inspiration radiograph. Cardiomegaly, unchanged. Central pulmonary vascular congestion and interstitial edema. Similar to the prior examination of 04/16/2021, there is a small to moderate right pleural effusion partly tracking into the minor fissure. Associated right basilar atelectasis and/or consolidation. Suspected trace left pleural effusion. No evidence of pneumothorax. No acute bony abnormality identified. IMPRESSION: Cardiomegaly, unchanged. Central pulmonary vascular congestion and interstitial edema. Similar to the prior examination of 04/16/2021, there is a small-to-moderate right pleural effusion partly tracking into the minor fissure. Associated right basilar atelectasis and/or consolidation. Suspected trace left pleural effusion. Electronically Signed   By: Kellie Simmering D.O.   On: 07/15/2021 13:33    Procedures Procedures   Medications Ordered in ED Medications  furosemide (LASIX) 160 mg in dextrose 5 % 50 mL IVPB (0 mg Intravenous Stopped 07/15/21 2303)  metolazone (ZAROXOLYN) tablet 5 mg (5 mg Oral Given 07/15/21 2150)  amLODipine (NORVASC) tablet 10 mg (has no administration in time range)  hydrALAZINE (APRESOLINE) tablet 75 mg (75 mg Oral Given 07/15/21 2259)  labetalol (NORMODYNE) tablet 600 mg (600 mg Oral Given 07/15/21 2259)  pravastatin (PRAVACHOL) tablet 40 mg (has no administration in time range)  insulin aspart (novoLOG) injection 0-6 Units (has no administration in time range)  heparin injection 5,000 Units (5,000 Units Subcutaneous Given 07/15/21 2301)  acetaminophen (TYLENOL) tablet 650 mg (has no administration in time range)    Or  acetaminophen (TYLENOL) suppository 650 mg (has no administration in time range)    ED Course  I have reviewed the triage vital signs and the nursing notes.  Pertinent labs & imaging results that were available during my care of the patient were reviewed by me and considered in my medical  decision making (see chart for details).    MDM Rules/Calculators/A&P  55 year old gentleman presented to ER with concern for fluid overload, weight gain, heart failure exacerbation.  Sent from cardiology clinic for admission.  Seen by Dr. Haroldine Laws.  He recommends aggressive IV diuresis.  Discussed case with Dr. Hal Hope who will admit.  Have placed orders as recommended by cardiology.    Final Clinical Impression(s) / ED Diagnoses Final diagnoses:  Acute on chronic heart failure, unspecified heart failure type ([place])  Respiratory failure, unspecified chronicity, unspecified whether with hypoxia or hypercapnia [facility])    Rx / DC Orders ED Discharge Orders     None        Lucrezia Starch, MD 07/16/21 [phone]

## 2021-07-16 DIAGNOSIS — I10 Essential (primary) hypertension: Secondary | ICD-10-CM

## 2021-07-16 DIAGNOSIS — E1165 Type 2 diabetes mellitus with hyperglycemia: Secondary | ICD-10-CM

## 2021-07-16 DIAGNOSIS — N184 Chronic kidney disease, stage 4 (severe): Secondary | ICD-10-CM

## 2021-07-16 LAB — BASIC METABOLIC PANEL
Anion gap: 7 (ref 5–15)
BUN: 64 mg/dL — ABNORMAL HIGH (ref 6–20)
CO2: 22 mmol/L (ref 22–32)
Calcium: 8.2 mg/dL — ABNORMAL LOW (ref 8.9–10.3)
Chloride: 112 mmol/L — ABNORMAL HIGH (ref 98–111)
Creatinine, Ser: 4.3 mg/dL — ABNORMAL HIGH (ref 0.61–1.24)
GFR, Estimated: 15 mL/min — ABNORMAL LOW (ref >60)
Glucose, Bld: 121 mg/dL — ABNORMAL HIGH (ref 70–99)
Potassium: 4.9 mmol/L (ref 3.5–5.1)
Sodium: 141 mmol/L (ref 135–145)

## 2021-07-16 LAB — GLUCOSE, CAPILLARY
Glucose-Capillary: 82 mg/dL (ref 70–99)
Glucose-Capillary: 136 mg/dL — ABNORMAL HIGH (ref 70–99)
Glucose-Capillary: 158 mg/dL — ABNORMAL HIGH (ref 70–99)
Glucose-Capillary: 130 mg/dL — ABNORMAL HIGH (ref 70–99)
Glucose-Capillary: 175 mg/dL — ABNORMAL HIGH (ref 70–99)

## 2021-07-16 LAB — CBC
HCT: 27.7 % — ABNORMAL LOW (ref 39.0–52.0)
Hemoglobin: 9.1 g/dL — ABNORMAL LOW (ref 13.0–17.0)
MCH: 27.8 pg (ref 26.0–34.0)
MCHC: 32.9 g/dL (ref 30.0–36.0)
MCV: 84.7 fL (ref 80.0–100.0)
Platelets: 255 10*3/uL (ref 150–400)
RBC: 3.27 MIL/uL — ABNORMAL LOW (ref 4.22–5.81)
RDW: 14.4 % (ref 11.5–15.5)
WBC: 5.7 10*3/uL (ref 4.0–10.5)
nRBC: 0 % (ref 0.0–0.2)

## 2021-07-16 LAB — MAGNESIUM: Magnesium: 1.9 mg/dL (ref 1.7–2.4)

## 2021-07-16 LAB — CREATININE, SERUM
Creatinine, Ser: 4.31 mg/dL — ABNORMAL HIGH (ref 0.61–1.24)
GFR, Estimated: 15 mL/min — ABNORMAL LOW (ref >60)

## 2021-07-16 LAB — TSH: TSH: 1.95 u[IU]/mL (ref 0.350–4.500)

## 2021-07-16 MED ORDER — FUROSEMIDE 10 MG/ML IJ SOLN
160.0000 mg | Freq: Three times a day (TID) | INTRAVENOUS | Status: DC
  Administered 2021-07-16 – 2021-07-22 (×17): 160 mg via INTRAVENOUS
  Filled 2021-07-16: qty 16
  Filled 2021-07-16: qty 2
  Filled 2021-07-16 (×3): qty 16
  Filled 2021-07-16: qty 10
  Filled 2021-07-16: qty 16
  Filled 2021-07-16: qty 10
  Filled 2021-07-16: qty 16
  Filled 2021-07-16: qty 10
  Filled 2021-07-16: qty 2
  Filled 2021-07-16 (×3): qty 16
  Filled 2021-07-16: qty 10
  Filled 2021-07-16 (×3): qty 16
  Filled 2021-07-16: qty 4
  Filled 2021-07-16: qty 10

## 2021-07-16 NOTE — Progress Notes (Signed)
Mobility Specialist Progress Note    07/16/21 1131  Mobility  Activity Ambulated in hall  Level of Assistance Contact guard assist, steadying assist  Assistive Device  (IV pole)  Distance Ambulated (ft) 175 ft  Mobility Ambulated with assistance in hallway  Mobility Response Tolerated fair  Mobility performed by Mobility specialist  Bed Position Chair  $Mobility charge 1 Mobility   Pre-mobility: 81 HR, 113/61 BP, 94% SpO2 During mobility: 78% SpO2  Post-mobility: 81 HR, 92% SpO2   Pt received sitting EOB and agreeable. During walk, pt took multiple standing rest breaks and desatted as low as 78% c/o lightheadedness. SpO2 would recover with standing rest and pursed lip breathing on RA. Pt stated he did not think his fluid was this bad. Returned to chair with call bell in reach and RN notified.   After the walk, pt explained when at home he gets out of breath for any exertion like going to the BR and kitchen and needs frequent rest. Pt stated he is fine when he rests but it changes when he gets up.   [facility] Mobility Specialist  M.S. Primary Phone: [phone] M.S. Secondary Phone: [phone]

## 2021-07-16 NOTE — Progress Notes (Signed)
Patient states he does not use CPAP at home. Patient does not want a CPAP tonight.

## 2021-07-16 NOTE — Plan of Care (Signed)
Problem: Pain Managment: Goal: General experience of comfort will improve Outcome: Progressing   Problem: Education: Goal: Ability to verbalize understanding of medication therapies will improve Outcome: Progressing   Problem: Activity: Goal: Capacity to carry out activities will improve Outcome: Progressing   Problem: Nutrition: Goal: Adequate nutrition will be maintained Outcome: Completed/Met   Problem: Coping: Goal: Level of anxiety will decrease Outcome: Completed/Met   Problem: Elimination: Goal: Will not experience complications related to bowel motility Outcome: Completed/Met Goal: Will not experience complications related to urinary retention Outcome: Completed/Met

## 2021-07-16 NOTE — Consult Note (Addendum)
Cardiology Consultation:   Patient ID: [name] MRN: [id number]; DOB: 10-23-1965  Admit date: 07/15/2021 Date of Consult: 07/16/2021  PCP:  [name], MD   [facility] Providers Cardiologist:  Carlyle Dolly, MD  Advanced Heart Failure:  Glori Bickers, MD       Patient Profile:   [name] is a 55 y.o. male with a hx of  HTN, DM2, CKD IV, and chronic diastolic CHF, who is being seen 07/16/2021 for the evaluation of CHF at the request of Dr Bonner Puna.  History of Present Illness:   [name] was seen by Dr Haroldine Laws 12/23 in the office. He was sent from there to the hospital for admission, for diuresis.  Per his note:  Patient was admitted in 9/22 with CHF exacerbation.  Echo in 9/22 with EF 55-60%, moderate LVH.    Volume overloaded at post hospital follow up 06/24/21. Metolazone increased to 5 mg 5 days a week. Carvedilol stopped as he had been taking this with labetalol. Lisinopril subsequently stopped when labs showed SCr 5.6.   We saw him in the HF Clinic today with his son. Weight up > 20 pounds. Not responding to uptitration of oral diuretics. SOB at rest and with any activity. + orthopnea and severe LE edema. He does not know what medications he is taking, and thinks he is still taking lisinopril. Says he hasn't missed his diuretics. Not watching diet or weighing himself. No palpitations, syncope or presyncope.  Las Scr was up to 4.2> was told by he nephrologist Theador Hawthorne) that he may need HD soon.   His plan, 12/23:  1. Acute on Chronic diastolic CHF: Most recent echo in 9/22 showed EF 56-60%, moderate LVH, grade 2 diastolic dysfunction, normal RV size and systolic function.  Multiple admissions for CHF.  Cardiac amyloidosis (most likely transthyretin) is a concern, myeloma panel negative. He remains markedly overloaded with NYHA class IIIb-IV symptoms, most recent creatinine 4.18.  - He needs admission to IM for IV diuresis. - Would stop torsemide and  start Lasix 160 mg IV q 6 hours + metolazone 5 mg daily If no response will need initiation of HD - Place UNNA boots. - Cannot use Jardiance or ARB/ARNi with elevated creatinine.  - Now that he has insurance, PYP scan arranged to assess for TTR cardiac amyloidosis.    2. CKD stage 4: Creatinine 4.18 most recently.  If he fails IV Lasix, will likely need dialysis for volume removal. - He followed with Dr. Theador Hawthorne outpatient.   3. OSA: Using CPAP.    4. HTN: Elevated today, needs diuresis. - Continue labetalol 600 mg bid for now. - Continue hydralazine 75 mg tid.  - Off lisinopril with hyperkalemia, he is not sure if he is taking this.   5. DM2 - continue with home regimen - cover with SSI   12/24: [name] feels much better.  He had edema going up to his thighs and says the back of his thighs were very tight.  This is improved overnight.  He says he is urinating very frequently.    He admits that he took his medicines most of the time, but sometimes he would feel bad and not take them.  His wife does the cooking and she is vigilant about salt by his description.  When he was weighing every day, his weight would go up or down by 2 pounds at a time.  He quit weighing himself and his weight started climbing.  He admits  to dietary indiscretion as well.  His symptoms are greatly improved overnight.  But he still has shortness of breath and significant lower extremity edema.  He also says his abdomen is very tight.   Past Medical History:  Diagnosis Date   CHF (congestive heart failure) (HCC)    Diabetes mellitus    Hypertension     Past Surgical History:  Procedure Laterality Date   INCISION AND DRAINAGE     KNEE SURGERY       Home Medications:  Prior to Admission medications   Medication Sig Start Date End Date Taking? Authorizing Provider  acetaminophen (TYLENOL) 325 MG tablet Take 2 tablets (650 mg total) by mouth every 6 (six) hours as needed for mild pain (or Fever >/=  101). 10/01/20  Yes Emokpae, Courage, MD  amLODipine (NORVASC) 10 MG tablet Take 1 tablet (10 mg total) by mouth daily. 10/01/20  Yes Emokpae, Courage, MD  Garlic Oil 9371 MG CAPS Take 100 mg by mouth every morning.   Yes [provider]  glimepiride (AMARYL) 4 MG tablet Take 0.5 tablets (2 mg total) by mouth 2 (two) times daily. 04/23/21  Yes Barton Dubois, MD  hydrALAZINE (APRESOLINE) 50 MG tablet Take 1.5 tablets (75 mg total) by mouth 3 (three) times daily. 02/11/21 07/15/21 Yes Branch, Alphonse Guild, MD  labetalol (NORMODYNE) 300 MG tablet Take 2 tablets (600 mg total) by mouth 2 (two) times daily. 06/24/21  Yes Larey Dresser, MD  lisinopril (ZESTRIL) 10 MG tablet Take 10 mg by mouth daily.   Yes [provider]  metolazone (ZAROXOLYN) 5 MG tablet Take 1 tablet (5 mg total) by mouth daily. Take 5mg  (1 TAB) daily Monday to Friday. Skip the weekend 06/24/21  Yes Larey Dresser, MD  Multiple Vitamin (MULTIVITAMIN WITH MINERALS) TABS tablet Take 1 tablet by mouth daily.   Yes [provider]  pravastatin (PRAVACHOL) 40 MG tablet Take 40 mg by mouth in the morning.   Yes [provider]  torsemide (DEMADEX) 100 MG tablet Take 100 mg by mouth 2 (two) times daily. 05/13/21  Yes [provider]  Vitamin D, Ergocalciferol, (DRISDOL) 1.25 MG (50000 UNIT) CAPS capsule Take 50,000 Units by mouth every Friday.   Yes [provider]  Zinc 50 MG CAPS Take 50 mg by mouth every morning.   Yes [provider]    Inpatient Medications: Scheduled Meds:  amLODipine  10 mg Oral Daily   heparin  5,000 Units Subcutaneous Q8H   hydrALAZINE  75 mg Oral TID   insulin aspart  0-6 Units Subcutaneous TID WC   labetalol  600 mg Oral BID   metolazone  5 mg Oral Daily   pravastatin  40 mg Oral Daily   Continuous Infusions:  furosemide 160 mg (07/16/21 0443)   PRN Meds: acetaminophen **OR** acetaminophen  Allergies:   No Known Allergies  Social History:    Social History   Socioeconomic History   Marital status: Married    Spouse name: Not on file   Number of children: Not on file   Years of education: Not on file   Highest education level: Not on file  Occupational History   Not on file  Tobacco Use   Smoking status: Never   Smokeless tobacco: Never  Vaping Use   Vaping Use: Never used  Substance and Sexual Activity   Alcohol use: No   Drug use: No   Sexual activity: Not on file  Other Topics Concern  Not on file  Social History Narrative   Not on file   Social Determinants of Health   Financial Resource Strain: High Risk   Difficulty of Paying Living Expenses: Hard  Food Insecurity: Food Insecurity Present   Worried About Running Out of Food in the Last Year: Sometimes true   Ran Out of Food in the Last Year: Never true  Transportation Needs: No Transportation Needs   Lack of Transportation (Medical): No   Lack of Transportation (Non-Medical): No  Physical Activity: Not on file  Stress: Not on file  Social Connections: Not on file  Intimate Partner Violence: Not on file    Family History:   Family History  Problem Relation Age of Onset   Dementia Mother    Diabetes Father      ROS:  Please see the history of present illness.  All other ROS reviewed and negative.     Physical Exam/Data:   Vitals:   07/16/21 0100 07/16/21 0115 07/16/21 0130 07/16/21 0246  BP: 120/64 133/71 125/76 138/80  Pulse: 77 77 77 87  Resp: 17 17 16 18   Temp:    98 F (36.7 C)  TempSrc:    Oral  SpO2: 93% (!) 88% 92% 94%  Weight:    135.5 kg  Height:    5\' 11"  (1.803 m)    Intake/Output Summary (Last 24 hours) at 07/16/2021 0838 Last data filed at 07/16/2021 1610 Gross per 24 hour  Intake 303.61 ml  Output 700 ml  Net -396.39 ml   Last 3 Weights 07/16/2021 07/15/2021 07/15/2021  Weight (lbs) 298 lb 12.8 oz 308 lb 308 lb 3.2 oz  Weight (kg) 135.535 kg 139.708 kg 139.799 kg     Body mass index is 41.67 kg/m.   General:  Well nourished, well developed, in no acute distress HEENT: normal Neck: no JVD seen, difficult to assess secondary to body habitus Vascular: No carotid bruits; Distal radial pulses 2+ bilaterally, unable to palpate pedal pulses due to edema Cardiac:  normal S1, S2; RRR; no murmur  Lungs:  clear to auscultation bilaterally, no wheezing, rhonchi or rales  Abd: soft, nontender, no hepatomegaly  Ext: no edema Musculoskeletal:  No deformities, BUE and BLE strength normal and equal Skin: warm and dry  Neuro:  CNs 2-12 intact, no focal abnormalities noted Psych:  Normal affect   EKG:  The EKG was personally reviewed and demonstrates: Sinus rhythm Telemetry:  Telemetry was personally reviewed and demonstrates: Sinus rhythm  Relevant CV Studies:  ECHO: 04/17/2021  1. Left ventricular ejection fraction, by estimation, is 55 to 60%. The  left ventricle has normal function. The left ventricle has no regional  wall motion abnormalities. There is moderate left ventricular hypertrophy.  Left ventricular diastolic  parameters are indeterminate.   2. Right ventricular systolic function is normal. The right ventricular  size is normal. There is moderately elevated pulmonary artery systolic  pressure.   3. Left atrial size was mildly dilated.   4. The mitral valve is normal in structure. No evidence of mitral valve  regurgitation. No evidence of mitral stenosis.   5. The tricuspid valve is abnormal.   6. The aortic valve is tricuspid. Aortic valve regurgitation is not  visualized. No aortic stenosis is present.   7. The inferior vena cava is normal in size with greater than 50%  respiratory variability, suggesting right atrial pressure of 3 mmHg.   Laboratory Data:  High Sensitivity Troponin:  No results for input(s): TROPONINIHS in  the last 720 hours.   Chemistry Recent Labs  Lab 07/15/21 1130 07/15/21 1259 07/16/21 0029  NA 140 139  --   K 5.3* 5.4*  --   CL 112* 113*  --    CO2 19* 21*  --   GLUCOSE 234* 164*  --   BUN 62* 62*  --   CREATININE 4.08* 4.15* 4.31*  CALCIUM 8.0* 8.0*  --   MG  --   --  1.9  GFRNONAA 16* 16* 15*  ANIONGAP 9 5  --     Recent Labs  Lab 07/15/21 1130  PROT 6.0*  ALBUMIN 2.8*  AST 17  ALT 19  ALKPHOS 78  BILITOT 0.5   Lipids No results for input(s): CHOL, TRIG, HDL, LABVLDL, LDLCALC, CHOLHDL in the last 168 hours.  Hematology Recent Labs  Lab 07/15/21 1130 07/15/21 1259 07/16/21 0220  WBC 4.9 5.2 5.7  RBC 3.52* 3.48* 3.27*  HGB 9.4* 9.5* 9.1*  HCT 29.9* 29.9* 27.7*  MCV 84.9 85.9 84.7  MCH 26.7 27.3 27.8  MCHC 31.4 31.8 32.9  RDW 14.3 14.4 14.4  PLT 261 277 255   Thyroid  Recent Labs  Lab 07/16/21 0029  TSH 1.950    BNP Recent Labs  Lab 07/15/21 1130 07/15/21 1259  BNP 638.5* 620.0*    DDimer No results for input(s): DDIMER in the last 168 hours.   Radiology/Studies:  DG Chest 2 View  Result Date: 07/15/2021 CLINICAL DATA:  Provided history: Shortness of breath. Additional history provided: Patient with history of hypertension, type 2 diabetes, CKD stage 3 and chronic diastolic CHF. Exertional dyspnea. EXAM: CHEST - 2 VIEW COMPARISON:  Prior chest radiographs 04/16/2021 and earlier. FINDINGS: Shallow inspiration radiograph. Cardiomegaly, unchanged. Central pulmonary vascular congestion and interstitial edema. Similar to the prior examination of 04/16/2021, there is a small to moderate right pleural effusion partly tracking into the minor fissure. Associated right basilar atelectasis and/or consolidation. Suspected trace left pleural effusion. No evidence of pneumothorax. No acute bony abnormality identified. IMPRESSION: Cardiomegaly, unchanged. Central pulmonary vascular congestion and interstitial edema. Similar to the prior examination of 04/16/2021, there is a small-to-moderate right pleural effusion partly tracking into the minor fissure. Associated right basilar atelectasis and/or consolidation.  Suspected trace left pleural effusion. Electronically Signed   By: Kellie Simmering D.O.   On: 07/15/2021 13:33     Assessment and Plan:   Acute on chronic diastolic CHF -He has good urine output from the IV Lasix, his weight is down 10 pounds today.  Continue IV Lasix and p.o. metolazone - his creatinine went from 4.08>> 4.15>> 4.31 today -Recommend nephrology consult.  If not responding to Lasix, next step would be to start HD -Dietary, med, weight compliance emphasized  2.  AKI on CKD IV -He follows with Dr. Theador Hawthorne - He has been told he may need dialysis soon  Otherwise, per IM   Risk Assessment/Risk Scores:       New York Heart Association (NYHA) Functional Class NYHA Class IV     For questions or updates, please contact [place] Please consult [email or web address] for contact info under    Signed, Rosaria Ferries, PA-C  07/16/2021 8:38 AM  Patient seen and examined.  Agree with below condition.  Mr. Rosencrans is a 55 year old male with a history of CKD stage IV, hypertension, F0YO, chronic diastolic heart failure who we are consulted for evaluation of heart failure at the request of Dr. Bonner Puna.  He was admitted in September 2022 with decompensated  heart failure.  Echocardiogram at that time showed EF 55 to 60%.  At his follow-up appointment on December 2, his metolazone was increased to 5 mg 5 days a week as he remained volume overloaded.  He was seen in heart failure clinic yesterday and weight was up more than 20 pounds.  He was not responding to p.o. diuretics so was recommended that he be admitted.  His creatinine was up to 4.2, he had been told by his nephrologist that he may need HD soon.  Dr. Haroldine Laws saw him in clinic yesterday and recommended admission for IV diuresis (Lasix 160 mg every 6 hours plus metolazone 5 mg daily) and start HD if no response.  He will need PYP scan at some point to assess for ATTR amyloid.  Will screen for AL amyloid.   On exam, patient is alert and  oriented, regular rate and rhythm, no murmurs, diminished breath sounds, 2+ lower extremity edema, JVD difficult to assess given habitus.  Recommend continuing IV Lasix 160 mg every 6 hours plus metolazone 5 mg daily.  Recommend nephrology consult given tenuous renal function.    Donato Heinz, MD

## 2021-07-16 NOTE — Progress Notes (Signed)
PROGRESS NOTE  [name]  ZDG:[id number] DOB: Nov 15, 1965 DOA: 07/15/2021 PCP: [name], [name]  Outpatient Specialists: Cardiology, Dr. Haroldine Laws Brief Narrative: [name] is a 55 y.o. male with a history of chronic HFpEF, stage IV CKD, T2DM, HTN, anemia who was referred to the ED 12/23 from the cardiology clinic due to volume overload. He had experienced >20 lbs weight gain, appeared with significant dependent edema and dyspnea with CXR confirming pulmonary congestion and pleural effusion. BNP 620, Cr near baseline at 4.1, K 5.4. IV lasix and oral metolazone was given with fair diuresis, improved symptoms. Nephrology consult is recommended as creatinine is 4.3 and if unable to diurese effectively, initiation of dialysis  is considered.  Assessment & Plan: Principal Problem:   Acute on chronic diastolic CHF (congestive heart failure) (HCC) Active Problems:   Essential hypertension   Uncontrolled type 2 diabetes mellitus with hyperglycemia (HCC)   CKD (chronic kidney disease) stage 4, GFR 15-29 ml/min (HCC)   Acute exacerbation of CHF (congestive heart failure) ([place])  Acute on chronic HFpEF: Echo Sept 2022 w/LVEF 55-60%, G2DD. Remains grossly volume overloaded.  - Appreciate cardiology following patient, will continue their recommendations of lasix 160mg  IV q6h and daily metolazone 5mg .  - Monitor I/O, daily weights, metabolic panel closely. - Work up for cardiac amyloidosis per cardiology. PYP scat to assess for TTR amyloidosis.   Acute hypoxic respiratory failure due to pulmonary edema due to acute CHF as above:  - Down to 78% with ambulation today, apply O2 to maintain SpO2 >90%.  Stage IV CKD: SCr slightly up thus far. Follows with Dr. Theador Hawthorne as outpatient. - Nephrology consult recommended.   Chronic LLE wound: for several months after scratching dry area. No evidence of infection, suspect diabetes and swelling are contributing to poor wound healing.  - Agree he may  benefit from [employer].   HTN:  - Continue labetalol 600mg  BID, hydralazine 75mg  TID, norvasc 10mg   Hyperkalemia: Resolved with loop diuretic.  - Continue monitoring.  - Holding ACE/ARB/spiro  T2DM: Well-controlled with HbA1c 7.1%.  - Continue very sensitive SSI - Holding home glimepiride - Continue statin  OSA: Normal RV on echo. 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 Hgb stable in 9's - Trend CBC  Obesity: Estimated body mass index is 41.67 kg/m as calculated from the following:   Height as of this encounter: 5\' 11"  (1.803 m).   Weight as of this encounter: 135.5 kg. - Anticipate "improved" BMI as we diurese.  DVT prophylaxis: Heparin [place] Code Status: Full Family Communication: None at bedside Disposition Plan:  Status is: Inpatient  Remains inpatient appropriate because: requires continued IV diuresis.  Consultants:  Cardiology Nephrology  Procedures:  None  Antimicrobials: None   Subjective: Feels much better overnight. More strength than he's had in a while, still short winded and legs still swollen up to abdomen. Urinated subjectively much more than he had been.  Objective: Vitals:   07/16/21 0246 07/16/21 0930 07/16/21 0950 07/16/21 1335  BP: 138/80 (!) 163/90  128/77  Pulse: 87 84 85 80  Resp: 18 17    Temp: 98 F (36.7 C) 97.8 F (36.6 C)  97.9 F (36.6 C)  TempSrc: Oral Oral  Oral  SpO2: 94% 92%  91%  Weight: 135.5 kg     Height: 5\' 11"  (1.803 m)       Intake/Output Summary (Last 24 hours) at 07/16/2021 1343 Last data filed at 07/16/2021 1159 Gross per 24 hour  Intake 303.61  ml  Output 1975 ml  Net -1671.39 ml   Filed Weights   07/15/21 1835 07/16/21 0246  Weight: (!) 139.7 kg 135.5 kg   Gen: 55 y.o. male in no distress Pulm: Non-labored breathing. Clear, distant to auscultation bilaterally.  CV: Regular rate and rhythm. No murmur, rub, or gallop. UTD JVD, significant dependent pitting edema extending to inferior abdominal wall. GI:  Abdomen soft, obese, non-tender, non-distended, with normoactive bowel sounds. No organomegaly or masses felt. Ext: Warm, no deformities Skin: RLE posterior scabbed wound with minimal surrounding erythema and no discharge.  Neuro: Alert and oriented. No focal neurological deficits. Psych: Judgement and insight appear normal. Mood & affect appropriate.   Data Reviewed: I have personally reviewed following labs and imaging studies  CBC: Recent Labs  Lab 07/15/21 1130 07/15/21 1259 07/16/21 0220  WBC 4.9 5.2 5.7  HGB 9.4* 9.5* 9.1*  HCT 29.9* 29.9* 27.7*  MCV 84.9 85.9 84.7  PLT 261 277 182   Basic Metabolic Panel: Recent Labs  Lab 07/15/21 1130 07/15/21 1259 07/16/21 0029  NA 140 139 141  K 5.3* 5.4* 4.9  CL 112* 113* 112*  CO2 19* 21* 22  GLUCOSE 234* 164* 121*  BUN 62* 62* 64*  CREATININE 4.08* 4.15* 4.30*   4.31*  CALCIUM 8.0* 8.0* 8.2*  MG  --   --  1.9   GFR: Estimated Creatinine Clearance: 26.9 mL/min (A) (by C-G formula based on SCr of 4.31 mg/dL (H)). Liver Function Tests: Recent Labs  Lab 07/15/21 1130  AST 17  ALT 19  ALKPHOS 78  BILITOT 0.5  PROT 6.0*  ALBUMIN 2.8*   No results for input(s): LIPASE, AMYLASE in the last 168 hours. No results for input(s): AMMONIA in the last 168 hours. Coagulation Profile: No results for input(s): INR, PROTIME in the last 168 hours. Cardiac Enzymes: No results for input(s): CKTOTAL, CKMB, CKMBINDEX, TROPONINI in the last 168 hours. BNP (last 3 results) No results for input(s): PROBNP in the last 8760 hours. HbA1C: Recent Labs    07/15/21 1130  HGBA1C 7.1*   CBG: Recent Labs  Lab 07/16/21 0836 07/16/21 1144  GLUCAP 82 136*   Lipid Profile: No results for input(s): CHOL, HDL, LDLCALC, TRIG, CHOLHDL, LDLDIRECT in the last 72 hours. Thyroid Function Tests: Recent Labs    07/16/21 0029  TSH 1.950   Anemia Panel: No results for input(s): VITAMINB12, FOLATE, FERRITIN, TIBC, IRON, RETICCTPCT in the last  72 hours. Urine analysis:    Component Value Date/Time   COLORURINE YELLOW 06/14/2020 [place] 06/14/2020 1452   LABSPEC 1.010 06/14/2020 1452   PHURINE 5.0 06/14/2020 1452   GLUCOSEU NEGATIVE 06/14/2020 1452   HGBUR NEGATIVE 06/14/2020 1452   BILIRUBINUR NEGATIVE 06/14/2020 1452   KETONESUR NEGATIVE 06/14/2020 1452   PROTEINUR 100 (A) 06/14/2020 1452   NITRITE NEGATIVE 06/14/2020 1452   LEUKOCYTESUR NEGATIVE 06/14/2020 1452   Recent Results (from the past 240 hour(s))  Resp Panel by RT-PCR (Flu A&B, Covid) Nasopharyngeal Swab     Status: None   Collection Time: 07/15/21  1:00 PM   Specimen: Nasopharyngeal Swab; Nasopharyngeal(NP) swabs in vial transport medium  Result Value Ref Range Status   SARS Coronavirus 2 by RT PCR NEGATIVE NEGATIVE Final    Comment: (NOTE) SARS-CoV-2 target nucleic acids are NOT DETECTED.  The SARS-CoV-2 RNA is generally detectable in upper respiratory specimens during the acute phase of infection. The lowest concentration of SARS-CoV-2 viral copies this assay can detect is 138  copies/mL. A negative result does not preclude SARS-Cov-2 infection and should not be used as the sole basis for treatment or other patient management decisions. A negative result may occur with  improper specimen collection/handling, submission of specimen other than nasopharyngeal swab, presence of viral mutation(s) within the areas targeted by this assay, and inadequate number of viral copies(<138 copies/mL). A negative result must be combined with clinical observations, patient history, and epidemiological information. The expected result is Negative.  Fact Sheet for Patients:  EntrepreneurPulse.com.au  Fact Sheet for Healthcare Providers:  IncredibleEmployment.be  This test is no t yet approved or cleared by the [place] FDA and  has been authorized for detection and/or diagnosis of SARS-CoV-2 by FDA under an  Emergency Use Authorization (EUA). This EUA will remain  in effect (meaning this test can be used) for the duration of the COVID-19 declaration under Section 564(b)(1) of the Act, 21 U.S.C.section 360bbb-3(b)(1), unless the authorization is terminated  or revoked sooner.       Influenza A by PCR NEGATIVE NEGATIVE Final   Influenza B by PCR NEGATIVE NEGATIVE Final    Comment: (NOTE) The Xpert Xpress SARS-CoV-2/FLU/RSV plus assay is intended as an aid in the diagnosis of influenza from Nasopharyngeal swab specimens and should not be used as a sole basis for treatment. Nasal washings and aspirates are unacceptable for Xpert Xpress SARS-CoV-2/FLU/RSV testing.  Fact Sheet for Patients: EntrepreneurPulse.com.au  Fact Sheet for Healthcare Providers: IncredibleEmployment.be  This test is not yet approved or cleared by the [place] FDA and has been authorized for detection and/or diagnosis of SARS-CoV-2 by FDA under an Emergency Use Authorization (EUA). This EUA will remain in effect (meaning this test can be used) for the duration of the COVID-19 declaration under Section 564(b)(1) of the Act, 21 U.S.C. section 360bbb-3(b)(1), unless the authorization is terminated or revoked.  Performed at [facility], [place] [address], [place], [place] [address]       Radiology Studies: DG Chest 2 View  Result Date: 07/15/2021 CLINICAL DATA:  Provided history: Shortness of breath. Additional history provided: Patient with history of hypertension, type 2 diabetes, CKD stage 3 and chronic diastolic CHF. Exertional dyspnea. EXAM: CHEST - 2 VIEW COMPARISON:  Prior chest radiographs 04/16/2021 and earlier. FINDINGS: Shallow inspiration radiograph. Cardiomegaly, unchanged. Central pulmonary vascular congestion and interstitial edema. Similar to the prior examination of 04/16/2021, there is a small to moderate right pleural effusion partly tracking into the  minor fissure. Associated right basilar atelectasis and/or consolidation. Suspected trace left pleural effusion. No evidence of pneumothorax. No acute bony abnormality identified. IMPRESSION: Cardiomegaly, unchanged. Central pulmonary vascular congestion and interstitial edema. Similar to the prior examination of 04/16/2021, there is a small-to-moderate right pleural effusion partly tracking into the minor fissure. Associated right basilar atelectasis and/or consolidation. Suspected trace left pleural effusion. Electronically Signed   By: Kellie Simmering D.O.   On: 07/15/2021 13:33    Scheduled Meds:  amLODipine  10 mg Oral Daily   heparin  5,000 Units Subcutaneous Q8H   hydrALAZINE  75 mg Oral TID   insulin aspart  0-6 Units Subcutaneous TID WC   labetalol  600 mg Oral BID   metolazone  5 mg Oral Daily   pravastatin  40 mg Oral Daily   Continuous Infusions:  furosemide 160 mg (07/16/21 1028)     LOS: 1 day   Time spent: 35 minutes.  Larry Pour, [name] [facility] [email or web address] 07/16/2021, 1:43 PM

## 2021-07-16 NOTE — Consult Note (Signed)
[facility] Renal Consultation Note  Requesting MD: Oswaldo Milian, MD Indication for Consultation:  advanced CKD with overload   Chief complaint: fluid overload   HPI:  [name] is a 55 y.o. 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  Note that on 06/24/2021 his metolazone was increased to 5 mg 5 days a week given persistent overload at that time per charting.  When he was seen in heart failure follow-up clinic on 12/23 his weight was noted to be up more than 20 pounds and he was directed to the hospital for IV diuretics.  Dr. Haroldine Laws recommended Lasix 160 mg IV every 6 hours plus metolazone 5 mg daily with the knowledge that he may need to start dialysis if he does not respond.  Cardiology is also planning to rule out amyloid as well.  Creatinine trends as below.  700 and mils of urine recorded on 12/23 and 1.3 L as well as one unmeasured urine void reported today.  He follows with Dr. Theador Hawthorne.  Per nephrology notes his metolazone was increased to 5 mg three times a week at the 06/08/21 office appointment.  He does think that his thighs don't feel tight since yesterday.  He would want dialysis if it were needed - he thought was going to have to start when GFR was 12 but it got better.  He does not have access. Short of breath after ambulating to restroom - just had diarrhea.   Creatinine, Ser  Date/Time Value Ref Range Status  07/16/2021 12:29 AM 4.31 (H) 0.61 - 1.24 mg/dL Final  07/16/2021 12:29 AM 4.30 (H) 0.61 - 1.24 mg/dL Final  07/15/2021 12:59 PM 4.15 (H) 0.61 - 1.24 mg/dL Final  07/15/2021 11:30 AM 4.08 (H) 0.61 - 1.24 mg/dL Final  06/24/2021 12:50 PM 4.18 (H) 0.61 - 1.24 mg/dL Final  06/08/2021 11:17 AM 3.74 (H) 0.61 - 1.24 mg/dL Final  05/20/2021 11:10 AM 3.97 (H) 0.61 - 1.24 mg/dL Final   04/22/2021 05:58 AM 5.16 (H) 0.61 - 1.24 mg/dL Final  04/21/2021 05:36 AM 4.96 (H) 0.61 - 1.24 mg/dL Final  04/20/2021 05:26 AM 4.86 (H) 0.61 - 1.24 mg/dL Final  04/19/2021 05:42 AM 4.99 (H) 0.61 - 1.24 mg/dL Final  04/18/2021 05:47 AM 4.46 (H) 0.61 - 1.24 mg/dL Final  04/17/2021 05:47 AM 3.59 (H) 0.61 - 1.24 mg/dL Final  04/16/2021 08:47 AM 3.36 (H) 0.61 - 1.24 mg/dL Final  02/28/2021 09:13 AM 3.31 (H) 0.61 - 1.24 mg/dL Final  02/18/2021 10:00 AM 3.07 (H) 0.61 - 1.24 mg/dL Final  01/20/2021 08:29 AM 4.20 (H) 0.61 - 1.24 mg/dL Final  01/19/2021 09:58 AM 4.06 (H) 0.61 - 1.24 mg/dL Final  12/29/2020 09:23 AM 4.40 (H) 0.61 - 1.24 mg/dL Final  11/29/2020 09:57 AM 2.76 (H) 0.61 - 1.24 mg/dL Final  11/23/2020 09:37 AM 2.65 (H) 0.61 - 1.24 mg/dL Final  11/15/2020 01:38 PM 2.78 (H) 0.61 - 1.24 mg/dL Final  10/12/2020 08:53 AM 2.30 (H) 0.61 - 1.24 mg/dL Final  10/01/2020 05:55 AM 2.93 (H) 0.61 - 1.24 mg/dL Final  09/30/2020 05:14 AM 2.74 (H) 0.61 - 1.24 mg/dL Final  09/28/2020 05:15 AM 2.86 (H) 0.61 - 1.24 mg/dL Final  09/27/2020 03:30 PM 2.90 (H) 0.61 - 1.24 mg/dL Final  09/06/2020 02:21 PM 1.95 (H) 0.61 - 1.24 mg/dL Final  06/21/2020 12:25 PM 2.43 (  H) 0.61 - 1.24 mg/dL Final  06/20/2020 10:05 AM 2.10 (H) 0.61 - 1.24 mg/dL Final  06/19/2020 08:42 AM 2.27 (H) 0.61 - 1.24 mg/dL Final  06/18/2020 08:18 AM 2.26 (H) 0.61 - 1.24 mg/dL Final  06/17/2020 06:02 AM 2.16 (H) 0.61 - 1.24 mg/dL Final  06/16/2020 08:23 AM 2.13 (H) 0.61 - 1.24 mg/dL Final  06/15/2020 05:52 AM 2.10 (H) 0.61 - 1.24 mg/dL Final  06/14/2020 03:20 PM 2.09 (H) 0.61 - 1.24 mg/dL Final  05/07/2020 05:30 AM 2.09 (H) 0.61 - 1.24 mg/dL Final  05/06/2020 06:41 AM 2.26 (H) 0.61 - 1.24 mg/dL Final  05/05/2020 06:24 AM 2.03 (H) 0.61 - 1.24 mg/dL Final  05/04/2020 06:55 AM 1.88 (H) 0.61 - 1.24 mg/dL Final  05/03/2020 01:29 PM 1.89 (H) 0.61 - 1.24 mg/dL Final  04/28/2020 02:35 PM 1.55 (H) 0.76 - 1.27 mg/dL Final  04/10/2020 06:39 AM  2.12 (H) 0.61 - 1.24 mg/dL Final  04/09/2020 08:44 AM 1.77 (H) 0.61 - 1.24 mg/dL Final  04/08/2020 08:20 AM 1.96 (H) 0.61 - 1.24 mg/dL Final  04/07/2020 06:02 AM 1.66 (H) 0.61 - 1.24 mg/dL Final  04/06/2020 08:47 AM 1.58 (H) 0.61 - 1.24 mg/dL Final  04/05/2020 01:50 PM 1.67 (H) 0.61 - 1.24 mg/dL Final  07/22/2011 06:35 AM 0.83 0.50 - 1.35 mg/dL Final  07/21/2011 04:52 AM 0.83 0.50 - 1.35 mg/dL Final  07/19/2011 05:01 PM 0.93 0.50 - 1.35 mg/dL Final  05/03/2009 11:50 AM 1.01 0.4 - 1.5 mg/dL Final     PMHx:   Past Medical History:  Diagnosis Date   CHF (congestive heart failure) ([place])    Diabetes mellitus    Hypertension     Past Surgical History:  Procedure Laterality Date   INCISION AND DRAINAGE     KNEE SURGERY      Family Hx:  Family History  Problem Relation Age of Onset   Dementia Mother    Diabetes Father     Social History:  reports that he has never smoked. He has never used smokeless tobacco. He reports that he does not drink alcohol and does not use drugs.  Allergies: No Known Allergies  Medications: Prior to Admission medications   Medication Sig Start Date End Date Taking? Authorizing Provider  acetaminophen (TYLENOL) 325 MG tablet Take 2 tablets (650 mg total) by mouth every 6 (six) hours as needed for mild pain (or Fever >/= 101). 10/01/20  Yes Emokpae, Courage, MD  amLODipine (NORVASC) 10 MG tablet Take 1 tablet (10 mg total) by mouth daily. 10/01/20  Yes Emokpae, Courage, MD  Garlic Oil 0076 MG CAPS Take 100 mg by mouth every morning.   Yes [provider]  glimepiride (AMARYL) 4 MG tablet Take 0.5 tablets (2 mg total) by mouth 2 (two) times daily. 04/23/21  Yes Barton Dubois, MD  hydrALAZINE (APRESOLINE) 50 MG tablet Take 1.5 tablets (75 mg total) by mouth 3 (three) times daily. 02/11/21 07/15/21 Yes Branch, Alphonse Guild, MD  labetalol (NORMODYNE) 300 MG tablet Take 2 tablets (600 mg total) by mouth 2 (two) times daily. 06/24/21  Yes Larey Dresser,  MD  lisinopril (ZESTRIL) 10 MG tablet Take 10 mg by mouth daily.   Yes [provider]  metolazone (ZAROXOLYN) 5 MG tablet Take 1 tablet (5 mg total) by mouth daily. Take 5mg  (1 TAB) daily Monday to Friday. Skip the weekend 06/24/21  Yes Larey Dresser, MD  Multiple Vitamin (MULTIVITAMIN WITH MINERALS) TABS tablet Take 1 tablet by mouth daily.  Yes [provider]  pravastatin (PRAVACHOL) 40 MG tablet Take 40 mg by mouth in the morning.   Yes [provider]  torsemide (DEMADEX) 100 MG tablet Take 100 mg by mouth 2 (two) times daily. 05/13/21  Yes [provider]  Vitamin D, Ergocalciferol, (DRISDOL) 1.25 MG (50000 UNIT) CAPS capsule Take 50,000 Units by mouth every Friday.   Yes [provider]  Zinc 50 MG CAPS Take 50 mg by mouth every morning.   Yes [provider]    I have reviewed the patient's current and reported prior to admission medications.  Labs:  BMP Latest Ref Rng & Units 07/16/2021 07/16/2021 07/15/2021  Glucose 70 - 99 mg/dL 121(H) - 164(H)  BUN 6 - 20 mg/dL 64(H) - 62(H)  Creatinine 0.61 - 1.24 mg/dL 4.30(H) 4.31(H) 4.15(H)  BUN/Creat Ratio 9 - 20 - - -  Sodium 135 - 145 mmol/L 141 - 139  Potassium 3.5 - 5.1 mmol/L 4.9 - 5.4(H)  Chloride 98 - 111 mmol/L 112(H) - 113(H)  CO2 22 - 32 mmol/L 22 - 21(L)  Calcium 8.9 - 10.3 mg/dL 8.2(L) - 8.0(L)    Urinalysis    Component Value Date/Time   COLORURINE YELLOW 06/14/2020 [place] 06/14/2020 1452   LABSPEC 1.010 06/14/2020 1452   PHURINE 5.0 06/14/2020 1452   GLUCOSEU NEGATIVE 06/14/2020 1452   HGBUR NEGATIVE 06/14/2020 1452   BILIRUBINUR NEGATIVE 06/14/2020 1452   KETONESUR NEGATIVE 06/14/2020 1452   PROTEINUR 100 (A) 06/14/2020 1452   NITRITE NEGATIVE 06/14/2020 1452   LEUKOCYTESUR NEGATIVE 06/14/2020 1452     ROS:  Pertinent items noted in HPI and remainder of comprehensive ROS otherwise negative.  Physical Exam: Vitals:   07/16/21 0950  07/16/21 1335  BP:  128/77  Pulse: 85 80  Resp:    Temp:  97.9 F (36.6 C)  SpO2:  91%     General: adult male in bed with shortness of breath after ambulating to the restroom  HEENT: NCAT Eyes: EOMI sclera anicteric Neck: Supple trachea midline Heart: S1S2 no rub Lungs: clear to auscultation on room air but increased work of breathing ambulating to the restroom; improves with sitting Abdomen: softly distended /nt [name] habitus Extremities: 3-4+ pitting edema; no cyanosis or clubbing Skin: chronic venous stasis changes Neuro: alert and oriented x 3 provides hx and follows commands Pysch normal mood and affect GU no foley   Assessment/Plan:  # Acute on chronic diastolic CHF  - note cardiology has ordered lasix 160 mg IV every 6 hours and metolazone 5 mg daily  - for now try lasix 160 mg IV every 8 hours (TID) and continue metolazone daily  # CKD stage IV  - Cr was 3.74 with GFR 18 in November 2022 and in September GFR below 15 - If fluid is not able to be managed with diuretics would need initiation of hemodialysis  - hold lisinopril  - He follows with an outside practice, Dr. Theador Hawthorne [facility]  - check post-void residual bladder scan and in/out cath if indicated  # HTN  - optimize volume status with diuretics as above - stop amlodipine for now and hope to be able to hold if clouds volume assessment   # OSA - note decreased respiratory reserve  # DM type 2 - per primary team   Disposition - would continue inpatient monitoring  Claudia Desanctis 07/16/2021, 2:47 PM

## 2021-07-17 DIAGNOSIS — N179 Acute kidney failure, unspecified: Secondary | ICD-10-CM | POA: Diagnosis not present

## 2021-07-17 LAB — BASIC METABOLIC PANEL
Anion gap: 8 (ref 5–15)
BUN: 68 mg/dL — ABNORMAL HIGH (ref 6–20)
CO2: 21 mmol/L — ABNORMAL LOW (ref 22–32)
Calcium: 8 mg/dL — ABNORMAL LOW (ref 8.9–10.3)
Chloride: 110 mmol/L (ref 98–111)
Creatinine, Ser: 4.91 mg/dL — ABNORMAL HIGH (ref 0.61–1.24)
GFR, Estimated: 13 mL/min — ABNORMAL LOW (ref >60)
Glucose, Bld: 96 mg/dL (ref 70–99)
Potassium: 5.2 mmol/L — ABNORMAL HIGH (ref 3.5–5.1)
Sodium: 139 mmol/L (ref 135–145)

## 2021-07-17 LAB — CBC
HCT: 27.6 % — ABNORMAL LOW (ref 39.0–52.0)
Hemoglobin: 8.7 g/dL — ABNORMAL LOW (ref 13.0–17.0)
MCH: 26.8 pg (ref 26.0–34.0)
MCHC: 31.5 g/dL (ref 30.0–36.0)
MCV: 84.9 fL (ref 80.0–100.0)
Platelets: 270 10*3/uL (ref 150–400)
RBC: 3.25 MIL/uL — ABNORMAL LOW (ref 4.22–5.81)
RDW: 14.4 % (ref 11.5–15.5)
WBC: 4.9 10*3/uL (ref 4.0–10.5)
nRBC: 0 % (ref 0.0–0.2)

## 2021-07-17 LAB — GLUCOSE, CAPILLARY
Glucose-Capillary: 97 mg/dL (ref 70–99)
Glucose-Capillary: 131 mg/dL — ABNORMAL HIGH (ref 70–99)
Glucose-Capillary: 130 mg/dL — ABNORMAL HIGH (ref 70–99)
Glucose-Capillary: 156 mg/dL — ABNORMAL HIGH (ref 70–99)

## 2021-07-17 NOTE — Progress Notes (Signed)
Mobility Specialist Progress Note    07/17/21 1252  Mobility  Activity Refused mobility   Pt stated he would like to rest.   [facility] Specialist  M.S. Primary Phone: [phone] M.S. Secondary Phone: [phone]

## 2021-07-17 NOTE — Progress Notes (Signed)
[facility] Progress Note  Name: [name] MRN: [id number] DOB: Nov 16, 1965  Chief Complaint:  Fluid overload   Subjective:  He had 3.8 liters UOP over 12/24 as well as an unmeasured urine void with a BM.  He hasn't yet had a post-void residual bladder scan.  His legs feel much less tight - when he was admitted he could feel tightness up to his thighs.    Review of systems:  Reports shortness of breath with exertion but better than before  Denies chest pain  Denies n/v  --------------- Background on consult:  [name] is a 55 y.o. 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  Note that on 06/24/2021 his metolazone was increased to 5 mg 5 days a week given persistent overload at that time per charting.  When he was seen in heart failure follow-up clinic on 12/23 his weight was noted to be up more than 20 pounds and he was directed to the hospital for IV diuretics.  Dr. Haroldine Laws recommended Lasix 160 mg IV every 6 hours plus metolazone 5 mg daily with the knowledge that he may need to start dialysis if he does not respond.  Cardiology is also planning to rule out amyloid as well.  Creatinine trends as below.  700 and mils of urine recorded on 12/23 and 1.3 L as well as one unmeasured urine void reported today.  He follows with Dr. Theador Hawthorne.  Per nephrology notes his metolazone was increased to 5 mg three times a week at the 06/08/21 office appointment.  He does think that his thighs don't feel tight since yesterday.  He would want dialysis if it were needed - he thought was going to have to start when GFR was 12 but it got better.  He does not have access. Short of breath after ambulating to restroom - just had diarrhea.    Intake/Output Summary (Last 24 hours) at 07/17/2021 1042 Last data filed at 07/17/2021  0500 Gross per 24 hour  Intake 609 ml  Output 2825 ml  Net -2216 ml    Vitals:  Vitals:   07/17/21 0526 07/17/21 0756 07/17/21 0802 07/17/21 0807  BP:  (!) 142/80 (!) 142/80 (!) 142/80  Pulse:  79  81  Resp:  18    Temp:      TempSrc:      SpO2:  94%    Weight: 133.2 kg     Height:         Physical Exam:   General: adult male in bed in NAD at rest HEENT: NCAT Eyes: EOMI sclera anicteric Neck: Supple trachea midline Heart: S1S2 no rub Lungs: basilar crackles; unlabored at rest on room air  Abdomen: softly distended /nt [name] habitus Extremities: 3-4+ pitting edema bilateral lower extremities Skin: chronic venous stasis changes Neuro: alert and oriented x 3 provides hx and follows commands Pysch normal mood and affect GU no foley  Medications reviewed   Labs:  BMP Latest Ref Rng & Units 07/17/2021 07/16/2021 07/16/2021  Glucose 70 - 99 mg/dL 96 121(H) -  BUN 6 - 20 mg/dL 68(H) 64(H) -  Creatinine 0.61 - 1.24 mg/dL 4.91(H) 4.30(H) 4.31(H)  BUN/Creat Ratio 9 - 20 - - -  Sodium 135 - 145 mmol/L 139 141 -  Potassium 3.5 - 5.1 mmol/L 5.2(H) 4.9 -  Chloride 98 -  111 mmol/L 110 112(H) -  CO2 22 - 32 mmol/L 21(L) 22 -  Calcium 8.9 - 10.3 mg/dL 8.0(L) 8.2(L) -     Assessment/Plan:   # Acute on chronic diastolic CHF  - Continue lasix 160 mg IV every 8 hours and continue metolazone daily - note weight 139.7 kg on 12/23 for reference   # CKD stage IV  - Cr was 3.74 with GFR 18 in November 2022 and in September GFR below 15 - If fluid is not able to be managed with diuretics would need initiation of hemodialysis - holding home lisinopril  - changed to renal DM diet with fluid restriction 1.2 liters/day - He follows with an outside practice, Dr. Theador Hawthorne [facility]  - check post-void residual bladder scan and in/out cath if indicated.  Re-ordered    # HTN  - optimize volume status with diuretics as above - stop amlodipine for now and hope  to be able to hold if clouds volume assessment    # OSA - note decreased respiratory reserve   # DM type 2 - per primary team    Disposition - would continue inpatient monitoring  Claudia Desanctis, MD 07/17/2021 11:00 AM

## 2021-07-17 NOTE — Progress Notes (Addendum)
Progress Note  Patient Name: [name] Date of Encounter: 07/17/2021  [facility] Cardiologist: Carlyle Dolly, MD   Subjective   Denies any CP or SOB.  Inpatient Medications    Scheduled Meds:  heparin  5,000 Units Subcutaneous Q8H   hydrALAZINE  75 mg Oral TID   insulin aspart  0-6 Units Subcutaneous TID WC   labetalol  600 mg Oral BID   metolazone  5 mg Oral Daily   pravastatin  40 mg Oral Daily   Continuous Infusions:  furosemide 160 mg (07/17/21 0310)   PRN Meds: acetaminophen **OR** acetaminophen   Vital Signs    Vitals:   07/17/21 0526 07/17/21 0756 07/17/21 0802 07/17/21 0807  BP:  (!) 142/80 (!) 142/80 (!) 142/80  Pulse:  79  81  Resp:  18    Temp:      TempSrc:      SpO2:  94%    Weight: 133.2 kg     Height:        Intake/Output Summary (Last 24 hours) at 07/17/2021 0837 Last data filed at 07/17/2021 0500 Gross per 24 hour  Intake 789 ml  Output 3750 ml  Net -2961 ml   Last 3 Weights 07/17/2021 07/16/2021 07/15/2021  Weight (lbs) 293 lb 11.2 oz 298 lb 12.8 oz 308 lb  Weight (kg) 133.221 kg 135.535 kg 139.708 kg      Telemetry    NSR without significant ventricular ectopy  - Personally Reviewed  ECG    NSR without significant ST-T wave changes - Personally Reviewed  Physical Exam   GEN: No acute distress.   Neck: No JVD Cardiac: RRR, no murmurs, rubs, or gallops.  Respiratory: Clear to auscultation bilaterally. GI: Soft, nontender, non-distended  MS: 3+ pitting edema; No deformity. Neuro:  Nonfocal  Psych: Normal affect   Labs    High Sensitivity Troponin:  No results for input(s): TROPONINIHS in the last 720 hours.   Chemistry Recent Labs  Lab 07/15/21 1130 07/15/21 1259 07/16/21 0029 07/17/21 0258  NA 140 139 141 139  K 5.3* 5.4* 4.9 5.2*  CL 112* 113* 112* 110  CO2 19* 21* 22 21*  GLUCOSE 234* 164* 121* 96  BUN 62* 62* 64* 68*  CREATININE 4.08* 4.15* 4.30*   4.31* 4.91*  CALCIUM 8.0* 8.0* 8.2* 8.0*   MG  --   --  1.9  --   PROT 6.0*  --   --   --   ALBUMIN 2.8*  --   --   --   AST 17  --   --   --   ALT 19  --   --   --   ALKPHOS 78  --   --   --   BILITOT 0.5  --   --   --   GFRNONAA 16* 16* 15*   15* 13*  ANIONGAP 9 5 7 8     Lipids No results for input(s): CHOL, TRIG, HDL, LABVLDL, LDLCALC, CHOLHDL in the last 168 hours.  Hematology Recent Labs  Lab 07/15/21 1259 07/16/21 0220 07/17/21 0258  WBC 5.2 5.7 4.9  RBC 3.48* 3.27* 3.25*  HGB 9.5* 9.1* 8.7*  HCT 29.9* 27.7* 27.6*  MCV 85.9 84.7 84.9  MCH 27.3 27.8 26.8  MCHC 31.8 32.9 31.5  RDW 14.4 14.4 14.4  PLT 277 255 270   Thyroid  Recent Labs  Lab 07/16/21 0029  TSH 1.950    BNP Recent Labs  Lab 07/15/21 1130 07/15/21  1259  BNP 638.5* 620.0*    DDimer No results for input(s): DDIMER in the last 168 hours.   Radiology    DG Chest 2 View  Result Date: 07/15/2021 CLINICAL DATA:  Provided history: Shortness of breath. Additional history provided: Patient with history of hypertension, type 2 diabetes, CKD stage 3 and chronic diastolic CHF. Exertional dyspnea. EXAM: CHEST - 2 VIEW COMPARISON:  Prior chest radiographs 04/16/2021 and earlier. FINDINGS: Shallow inspiration radiograph. Cardiomegaly, unchanged. Central pulmonary vascular congestion and interstitial edema. Similar to the prior examination of 04/16/2021, there is a small to moderate right pleural effusion partly tracking into the minor fissure. Associated right basilar atelectasis and/or consolidation. Suspected trace left pleural effusion. No evidence of pneumothorax. No acute bony abnormality identified. IMPRESSION: Cardiomegaly, unchanged. Central pulmonary vascular congestion and interstitial edema. Similar to the prior examination of 04/16/2021, there is a small-to-moderate right pleural effusion partly tracking into the minor fissure. Associated right basilar atelectasis and/or consolidation. Suspected trace left pleural effusion. Electronically Signed    By: Kellie Simmering D.O.   On: 07/15/2021 13:33    Cardiac Studies   Echo 04/17/2021  1. Left ventricular ejection fraction, by estimation, is 55 to 60%. The  left ventricle has normal function. The left ventricle has no regional  wall motion abnormalities. There is moderate left ventricular hypertrophy.  Left ventricular diastolic  parameters are indeterminate.   2. Right ventricular systolic function is normal. The right ventricular  size is normal. There is moderately elevated pulmonary artery systolic  pressure.   3. Left atrial size was mildly dilated.   4. The mitral valve is normal in structure. No evidence of mitral valve  regurgitation. No evidence of mitral stenosis.   5. The tricuspid valve is abnormal.   6. The aortic valve is tricuspid. Aortic valve regurgitation is not  visualized. No aortic stenosis is present.   7. The inferior vena cava is normal in size with greater than 50%  respiratory variability, suggesting right atrial pressure of 3 mmHg.  Patient Profile     55 y.o. male with PMH of HTN, DM II, CKD stage IV and chronic diastolic CHF presented with acute on chronic diastolic CHF and weight gain of >20 lbs not responding to uptitration of oral diuretics.   Assessment & Plan    Acute on chronic diastolic CHF  - Echo in Sept 2022 showed EF 55-60%, moderate LVH, grade 2 DD, normal RV size and systolic function  - Cardiac amyloidosis (most likely transthyretin) is a concern, myeloma panel pending. PYP scan arranged to rule out amyloidosis.   - Breathing improved after IV diuresis. I/O -3.3 L so far, still has 3+ pitting edema in bilateral LE. Cr trended up to 4.9. Nephrology cut diuretic from 160mg  QID to TID dosing of IV lasix. Continue on 5mg  metolazone daily. Since the patient is very close to start on dialysis, will defer the dosing of diuretic to nephrology service.   - admission weight 308, today's weight 293.7, discharge weight in Oct was 126 kg which is 278 lbs,  however by the time he was seen by CHF clinic on 12/2, his weight was 296 lbs. Based on physical exam, I think the patient still has a way to go with diuresis.    Acute on CKD stage IV: Cr 4.08 --> 4.15 --> 4.31 --> 4.3 --> 4.91  OSA on CPAP  Anemia: hgb stable  HTN: on hydralazine and labetalol  DM II      For questions  or updates, please contact [place] Please consult [email or web address] for contact info under        Signed, Almyra Deforest, [place]  07/17/2021, 8:37 AM    Patient seen and examined.  Agree with above documentation.  On exam, patient is alert and oriented, regular rate and rhythm, no murmurs, lungs CTAB, marked LE edema, JVD difficult to assess given habitus.  Net negative 3L yesterday, Cr increased from 4.3 to 4.9.  Continue IV diuresis with close monitoring of renal function.  Nephrology following.  Donato Heinz, MD

## 2021-07-17 NOTE — Progress Notes (Signed)
Patient refused CPAP at this time.Patient aware to call for Respiratory he would like to use CPAP.

## 2021-07-17 NOTE — Progress Notes (Signed)
PROGRESS NOTE  [name]  ZOX:[id number] DOB: 11-07-1965 DOA: 07/15/2021 PCP: [name], MD  Outpatient Specialists: Cardiology, Dr. Haroldine Laws Brief Narrative: [name] is a 55 y.o. male with a history of chronic HFpEF, stage IV CKD, T2DM, HTN, anemia who was referred to the ED 12/23 from the cardiology clinic due to volume overload. He had experienced >20 lbs weight gain, appeared with significant dependent edema and dyspnea with CXR confirming pulmonary congestion and pleural effusion. BNP 620, Cr near baseline at 4.1, K 5.4. IV lasix and oral metolazone was given with fair diuresis, improved symptoms. Nephrology consult is recommended as creatinine is 4.3 and if unable to diurese effectively, initiation of dialysis  is considered.  Assessment & Plan: Principal Problem:   Acute on chronic diastolic CHF (congestive heart failure) (HCC) Active Problems:   Essential hypertension   Uncontrolled type 2 diabetes mellitus with hyperglycemia (HCC)   CKD (chronic kidney disease) stage 4, GFR 15-29 ml/min (HCC)   AKI (acute kidney injury) ([place])   Acute exacerbation of CHF (congestive heart failure) ([place])  Acute on chronic HFpEF: Echo Sept 2022 w/LVEF 55-60%, G2DD. Remains grossly volume overloaded.  - Appreciate cardiology following patient. Diuresis per nephrology, lasix 160mg  TID + metolazone 5mg  daily. Urine output and daily weights showing improvement, still severely overloaded.  - Monitor I/O, daily weights, metabolic panel closely. - Work up for cardiac amyloidosis per cardiology. PYP scat to assess for TTR amyloidosis.   Acute hypoxic respiratory failure due to pulmonary edema due to acute CHF as above:  - Down to 78% with ambulation today, apply O2 to maintain SpO2 >90%. Suspect this is improving based on his symptoms.   Stage IV CKD: SCr slightly up thus far. Follows with Dr. Theador Hawthorne ([place]) as outpatient. - Nephrology consulted, requesting PVR which has been reordered.    Chronic LLE wound: for several months after scratching dry area. No evidence of infection, suspect diabetes and swelling are contributing to poor wound healing.  - Agree he may benefit from [employer].   HTN:  - Continue labetalol 600mg  BID, hydralazine 75mg  TID, norvasc 10mg   Hyperkalemia: Stable - Continue monitoring.  - Changed to renal diet. - Holding ACE/ARB/spiro  T2DM: Well-controlled with HbA1c 7.1%.  - Continue very sensitive SSI. At inpatient goal without insulin needed. - Holding home glimepiride - Continue statin  OSA: Normal RV on echo. 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 Hgb stable ~9. - Trend CBC  Obesity: Estimated body mass index is 40.96 kg/m as calculated from the following:   Height as of this encounter: 5\' 11"  (1.803 m).   Weight as of this encounter: 133.2 kg. - Anticipate "improved" BMI as we diurese.  DVT prophylaxis: Heparin [place] Code Status: Full Family Communication: None at bedside Disposition Plan:  Status is: Inpatient  Remains inpatient appropriate because: requires continued IV diuresis.  Consultants:  Cardiology Nephrology  Procedures:  None  Antimicrobials: None   Subjective: Continues to have dyspnea with exertion, though swelling improving slowly, having significant subjective UOP. No chest pain.   Objective: Vitals:   07/17/21 0526 07/17/21 0756 07/17/21 0802 07/17/21 0807  BP:  (!) 142/80 (!) 142/80 (!) 142/80  Pulse:  79  81  Resp:  18    Temp:      TempSrc:      SpO2:  94%    Weight: 133.2 kg     Height:        Intake/Output Summary (Last 24 hours) at 07/17/2021 1248  Last data filed at 07/17/2021 0500 Gross per 24 hour  Intake 609 ml  Output 2475 ml  Net -1866 ml   Filed Weights   07/15/21 1835 07/16/21 0246 07/17/21 0526  Weight: (!) 139.7 kg 135.5 kg 133.2 kg   Gen: 55 y.o. male in no distress Pulm: Nonlabored breathing room air. Crackle/diminished at bases. CV: Regular rate and rhythm. No definite  murmur, rub, or gallop. UTD JVD, 2+  dependent edema extending to thighs GI: Abdomen soft, non-tender, non-distended, with normoactive bowel sounds.  Ext: Warm, no deformities Skin: No new rashes, lesions or ulcers on visualized skin. No exudate from legs. Neuro: Alert and oriented. No focal neurological deficits. Psych: Judgement and insight appear fair. Mood euthymic & affect congruent. Behavior is appropriate.    Data Reviewed: I have personally reviewed following labs and imaging studies  CBC: Recent Labs  Lab 07/15/21 1130 07/15/21 1259 07/16/21 0220 07/17/21 0258  WBC 4.9 5.2 5.7 4.9  HGB 9.4* 9.5* 9.1* 8.7*  HCT 29.9* 29.9* 27.7* 27.6*  MCV 84.9 85.9 84.7 84.9  PLT 261 277 255 623   Basic Metabolic Panel: Recent Labs  Lab 07/15/21 1130 07/15/21 1259 07/16/21 0029 07/17/21 0258  NA 140 139 141 139  K 5.3* 5.4* 4.9 5.2*  CL 112* 113* 112* 110  CO2 19* 21* 22 21*  GLUCOSE 234* 164* 121* 96  BUN 62* 62* 64* 68*  CREATININE 4.08* 4.15* 4.30*   4.31* 4.91*  CALCIUM 8.0* 8.0* 8.2* 8.0*  MG  --   --  1.9  --    GFR: Estimated Creatinine Clearance: 23.4 mL/min (A) (by C-G formula based on SCr of 4.91 mg/dL (H)). Liver Function Tests: Recent Labs  Lab 07/15/21 1130  AST 17  ALT 19  ALKPHOS 78  BILITOT 0.5  PROT 6.0*  ALBUMIN 2.8*   No results for input(s): LIPASE, AMYLASE in the last 168 hours. No results for input(s): AMMONIA in the last 168 hours. Coagulation Profile: No results for input(s): INR, PROTIME in the last 168 hours. Cardiac Enzymes: No results for input(s): CKTOTAL, CKMB, CKMBINDEX, TROPONINI in the last 168 hours. BNP (last 3 results) No results for input(s): PROBNP in the last 8760 hours. HbA1C: Recent Labs    07/15/21 1130  HGBA1C 7.1*   CBG: Recent Labs  Lab 07/16/21 1559 07/16/21 1801 07/16/21 2144 07/17/21 0728 07/17/21 1157  GLUCAP 158* 130* 175* 97 131*   Lipid Profile: No results for input(s): CHOL, HDL, LDLCALC, TRIG,  CHOLHDL, LDLDIRECT in the last 72 hours. Thyroid Function Tests: Recent Labs    07/16/21 0029  TSH 1.950   Anemia Panel: No results for input(s): VITAMINB12, FOLATE, FERRITIN, TIBC, IRON, RETICCTPCT in the last 72 hours. Urine analysis:    Component Value Date/Time   COLORURINE YELLOW 06/14/2020 [place] 06/14/2020 1452   LABSPEC 1.010 06/14/2020 1452   PHURINE 5.0 06/14/2020 1452   GLUCOSEU NEGATIVE 06/14/2020 1452   HGBUR NEGATIVE 06/14/2020 1452   BILIRUBINUR NEGATIVE 06/14/2020 1452   KETONESUR NEGATIVE 06/14/2020 1452   PROTEINUR 100 (A) 06/14/2020 1452   NITRITE NEGATIVE 06/14/2020 1452   LEUKOCYTESUR NEGATIVE 06/14/2020 1452   Recent Results (from the past 240 hour(s))  Resp Panel by RT-PCR (Flu A&B, Covid) Nasopharyngeal Swab     Status: None   Collection Time: 07/15/21  1:00 PM   Specimen: Nasopharyngeal Swab; Nasopharyngeal(NP) swabs in vial transport medium  Result Value Ref Range Status   SARS Coronavirus 2 by RT PCR  NEGATIVE NEGATIVE Final    Comment: (NOTE) SARS-CoV-2 target nucleic acids are NOT DETECTED.  The SARS-CoV-2 RNA is generally detectable in upper respiratory specimens during the acute phase of infection. The lowest concentration of SARS-CoV-2 viral copies this assay can detect is 138 copies/mL. A negative result does not preclude SARS-Cov-2 infection and should not be used as the sole basis for treatment or other patient management decisions. A negative result may occur with  improper specimen collection/handling, submission of specimen other than nasopharyngeal swab, presence of viral mutation(s) within the areas targeted by this assay, and inadequate number of viral copies(<138 copies/mL). A negative result must be combined with clinical observations, patient history, and epidemiological information. The expected result is Negative.  Fact Sheet for Patients:  EntrepreneurPulse.com.au  Fact Sheet for  Healthcare Providers:  IncredibleEmployment.be  This test is no t yet approved or cleared by the [place] FDA and  has been authorized for detection and/or diagnosis of SARS-CoV-2 by FDA under an Emergency Use Authorization (EUA). This EUA will remain  in effect (meaning this test can be used) for the duration of the COVID-19 declaration under Section 564(b)(1) of the Act, 21 U.S.C.section 360bbb-3(b)(1), unless the authorization is terminated  or revoked sooner.       Influenza A by PCR NEGATIVE NEGATIVE Final   Influenza B by PCR NEGATIVE NEGATIVE Final    Comment: (NOTE) The Xpert Xpress SARS-CoV-2/FLU/RSV plus assay is intended as an aid in the diagnosis of influenza from Nasopharyngeal swab specimens and should not be used as a sole basis for treatment. Nasal washings and aspirates are unacceptable for Xpert Xpress SARS-CoV-2/FLU/RSV testing.  Fact Sheet for Patients: EntrepreneurPulse.com.au  Fact Sheet for Healthcare Providers: IncredibleEmployment.be  This test is not yet approved or cleared by the [place] FDA and has been authorized for detection and/or diagnosis of SARS-CoV-2 by FDA under an Emergency Use Authorization (EUA). This EUA will remain in effect (meaning this test can be used) for the duration of the COVID-19 declaration under Section 564(b)(1) of the Act, 21 U.S.C. section 360bbb-3(b)(1), unless the authorization is terminated or revoked.  Performed at [facility], [place] [address], [place], [place] [address]       Radiology Studies: DG Chest 2 View  Result Date: 07/15/2021 CLINICAL DATA:  Provided history: Shortness of breath. Additional history provided: Patient with history of hypertension, type 2 diabetes, CKD stage 3 and chronic diastolic CHF. Exertional dyspnea. EXAM: CHEST - 2 VIEW COMPARISON:  Prior chest radiographs 04/16/2021 and earlier. FINDINGS: Shallow inspiration  radiograph. Cardiomegaly, unchanged. Central pulmonary vascular congestion and interstitial edema. Similar to the prior examination of 04/16/2021, there is a small to moderate right pleural effusion partly tracking into the minor fissure. Associated right basilar atelectasis and/or consolidation. Suspected trace left pleural effusion. No evidence of pneumothorax. No acute bony abnormality identified. IMPRESSION: Cardiomegaly, unchanged. Central pulmonary vascular congestion and interstitial edema. Similar to the prior examination of 04/16/2021, there is a small-to-moderate right pleural effusion partly tracking into the minor fissure. Associated right basilar atelectasis and/or consolidation. Suspected trace left pleural effusion. Electronically Signed   By: Kellie Simmering D.O.   On: 07/15/2021 13:33    Scheduled Meds:  heparin  5,000 Units Subcutaneous Q8H   hydrALAZINE  75 mg Oral TID   insulin aspart  0-6 Units Subcutaneous TID WC   labetalol  600 mg Oral BID   metolazone  5 mg Oral Daily   pravastatin  40 mg Oral Daily  Continuous Infusions:  furosemide 160 mg (07/17/21 1017)     LOS: 2 days   Time spent: 35 minutes.  Patrecia Pour, MD [facility] [email or web address] 07/17/2021, 12:48 PM

## 2021-07-18 DIAGNOSIS — N179 Acute kidney failure, unspecified: Secondary | ICD-10-CM | POA: Diagnosis not present

## 2021-07-18 LAB — BASIC METABOLIC PANEL
Anion gap: 11 (ref 5–15)
BUN: 73 mg/dL — ABNORMAL HIGH (ref 6–20)
CO2: 23 mmol/L (ref 22–32)
Calcium: 8.3 mg/dL — ABNORMAL LOW (ref 8.9–10.3)
Chloride: 106 mmol/L (ref 98–111)
Creatinine, Ser: 5.25 mg/dL — ABNORMAL HIGH (ref 0.61–1.24)
GFR, Estimated: 12 mL/min — ABNORMAL LOW (ref >60)
Glucose, Bld: 128 mg/dL — ABNORMAL HIGH (ref 70–99)
Potassium: 4.9 mmol/L (ref 3.5–5.1)
Sodium: 140 mmol/L (ref 135–145)

## 2021-07-18 LAB — GLUCOSE, CAPILLARY
Glucose-Capillary: 110 mg/dL — ABNORMAL HIGH (ref 70–99)
Glucose-Capillary: 130 mg/dL — ABNORMAL HIGH (ref 70–99)
Glucose-Capillary: 116 mg/dL — ABNORMAL HIGH (ref 70–99)
Glucose-Capillary: 137 mg/dL — ABNORMAL HIGH (ref 70–99)

## 2021-07-18 MED ORDER — HYDRALAZINE HCL 50 MG PO TABS
100.0000 mg | ORAL_TABLET | Freq: Three times a day (TID) | ORAL | Status: DC
  Administered 2021-07-18 – 2021-07-21 (×11): 100 mg via ORAL
  Filled 2021-07-18 (×13): qty 2

## 2021-07-18 MED ORDER — DARBEPOETIN ALFA 100 MCG/0.5ML IJ SOSY
100.0000 ug | PREFILLED_SYRINGE | INTRAMUSCULAR | Status: DC
  Administered 2021-07-18 – 2021-07-25 (×2): 100 ug via SUBCUTANEOUS
  Filled 2021-07-18 (×2): qty 0.5

## 2021-07-18 NOTE — Progress Notes (Signed)
[facility] Progress Note  Name: [name] MRN: [id number] DOB: 1966/06/10  Chief Complaint:  Fluid overload   Subjective:  He had another 3.4 liters UOP.   His legs feel much less tight - crt is climbing-  from 4 to now 5.25-  last albumin 2.8-  weight down almost 10 kg feels better  --------------- Background on consult:  OMID DEARDORFF is a 55 y.o. 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  Note that on 06/24/2021 his metolazone was increased to 5 mg 5 days a week given persistent overload at that time per charting.  When he was seen in heart failure follow-up clinic on 12/23 his weight was noted to be up more than 20 pounds and he was directed to the hospital for IV diuretics.  Dr. Haroldine Laws recommended Lasix 160 mg IV every 6 hours plus metolazone 5 mg daily with the knowledge that he may need to start dialysis if he does not respond.  Cardiology is also planning to rule out amyloid as well.  Creatinine trends as below.  700 and mils of urine recorded on 12/23 and 1.3 L as well as one unmeasured urine void reported today.  He follows with Dr. Theador Hawthorne.  Per nephrology notes his metolazone was increased to 5 mg three times a week at the 06/08/21 office appointment.  He does think that his thighs don't feel tight since yesterday.  He would want dialysis if it were needed - he thought was going to have to start when GFR was 12 but it got better.  He does not have access. Short of breath after ambulating to restroom - just had diarrhea.    Intake/Output Summary (Last 24 hours) at 07/18/2021 1001 Last data filed at 07/18/2021 3646 Gross per 24 hour  Intake 372 ml  Output 3400 ml  Net -3028 ml    Vitals:  Vitals:   07/17/21 2100 07/17/21 2250 07/18/21 0500 07/18/21 0525  BP: (!) 159/84 (!) 155/83  (!) 154/83   Pulse: 84 85  82  Resp: 18   18  Temp: 98.4 F (36.9 C)   98.3 F (36.8 C)  TempSrc: Oral   Oral  SpO2: 94%   96%  Weight:   130 kg   Height:         Physical Exam:   General: adult male in bed in NAD at rest HEENT: NCAT Eyes: EOMI sclera anicteric Neck: Supple trachea midline Heart: S1S2 no rub Lungs: basilar crackles; unlabored at rest on room air  Abdomen: softly distended /nt [name] habitus Extremities: 3-4+ pitting edema bilateral lower extremities Skin: chronic venous stasis changes Neuro: alert and oriented x 3 provides hx and follows commands Pysch normal mood and affect GU no foley  Medications reviewed   Labs:  BMP Latest Ref Rng & Units 07/18/2021 07/17/2021 07/16/2021  Glucose 70 - 99 mg/dL 128(H) 96 121(H)  BUN 6 - 20 mg/dL 73(H) 68(H) 64(H)  Creatinine 0.61 - 1.24 mg/dL 5.25(H) 4.91(H) 4.30(H)  BUN/Creat Ratio 9 - 20 - - -  Sodium 135 - 145 mmol/L 140 139 141  Potassium 3.5 - 5.1 mmol/L 4.9 5.2(H) 4.9  Chloride 98 - 111 mmol/L 106 110 112(H)  CO2 22 - 32 mmol/L 23 21(L) 22  Calcium 8.9 - 10.3 mg/dL 8.3(L) 8.0(L) 8.2(L)     Assessment/Plan:   #  Acute on chronic diastolic CHF  - Continue lasix 160 mg IV every 8 hours and continue metolazone daily - note weight 139.7 kg on 12/23 for reference-  is down to 130 but still has edema    # CKD stage IV  - Cr was 3.74 with GFR 18 in November 2022 and in September GFR below 15 - If fluid is not able to be managed with diuretics would need initiation of hemodialysis - holding home lisinopril and amlodipine - changed to renal DM diet with fluid restriction 1.2 liters/day - He follows with an outside practice, Dr. Theador Hawthorne [facility] -  needs access-  I talked to patient about this-  he is hesitant-  will at least do vein mapping and ask him again tomorrow if OK to get vascular consult-  feel that diuresis is just un masking his severe CKD-  he might not need it just now but is getting  very close and anticipate volume will continue to be difficult to control -  albumin is below 3 which is telling as well     # HTN  - optimize volume status with diuretics as above - stop amlodipine for now and hope to be able to hold if clouds volume assessment    # OSA - note decreased respiratory reserve   # DM type 2 - per primary team   Anemia-  check iron stores and add ESA      Louis Meckel, MD 07/18/2021 10:01 AM

## 2021-07-18 NOTE — Progress Notes (Signed)
Progress Note  Patient Name: [name] Date of Encounter: 07/18/2021  [facility] Cardiologist: Carlyle Dolly, MD   Subjective   Making progress with diuresis, 3 L in last 24 hours, net 6.4 L since admission.  Weight is down 12 pounds in the last 48 hours, but still well above estimated "dry weight" of 278 pounds, which may actually be an overestimation, based on the degree of edema that he still has. Labs still pending from today   Inpatient Medications    Scheduled Meds:  heparin  5,000 Units Subcutaneous Q8H   hydrALAZINE  75 mg Oral TID   insulin aspart  0-6 Units Subcutaneous TID WC   labetalol  600 mg Oral BID   metolazone  5 mg Oral Daily   pravastatin  40 mg Oral Daily   Continuous Infusions:  furosemide 160 mg (07/18/21 0358)   PRN Meds: acetaminophen **OR** acetaminophen   Vital Signs    Vitals:   07/17/21 2100 07/17/21 2250 07/18/21 0500 07/18/21 0525  BP: (!) 159/84 (!) 155/83  (!) 154/83  Pulse: 84 85  82  Resp: 18   18  Temp: 98.4 F (36.9 C)   98.3 F (36.8 C)  TempSrc: Oral   Oral  SpO2: 94%   96%  Weight:   130 kg   Height:        Intake/Output Summary (Last 24 hours) at 07/18/2021 0855 Last data filed at 07/18/2021 1941 Gross per 24 hour  Intake 372 ml  Output 3400 ml  Net -3028 ml   Last 3 Weights 07/18/2021 07/17/2021 07/16/2021  Weight (lbs) 286 lb 9.6 oz 293 lb 11.2 oz 298 lb 12.8 oz  Weight (kg) 130.001 kg 133.221 kg 135.535 kg      Telemetry    Normal sinus rhythm- Personally Reviewed  ECG    No new tracing- Personally Reviewed  Physical Exam  Obese.  Appears comfortable GEN: No acute distress.   Neck: JVP 8-9 cm sitting straight up Cardiac: RRR, no murmurs, rubs, or gallops.  Respiratory: Clear to auscultation bilaterally. GI: Soft, nontender, non-distended  MS: 3+ symmetrical pitting edema of the feet, ankles and shins almost to the knees; No deformity. Neuro:  Nonfocal  Psych: Normal affect   Labs     High Sensitivity Troponin:  No results for input(s): TROPONINIHS in the last 720 hours.   Chemistry Recent Labs  Lab 07/15/21 1130 07/15/21 1259 07/16/21 0029 07/17/21 0258  NA 140 139 141 139  K 5.3* 5.4* 4.9 5.2*  CL 112* 113* 112* 110  CO2 19* 21* 22 21*  GLUCOSE 234* 164* 121* 96  BUN 62* 62* 64* 68*  CREATININE 4.08* 4.15* 4.30*   4.31* 4.91*  CALCIUM 8.0* 8.0* 8.2* 8.0*  MG  --   --  1.9  --   PROT 6.0*  --   --   --   ALBUMIN 2.8*  --   --   --   AST 17  --   --   --   ALT 19  --   --   --   ALKPHOS 78  --   --   --   BILITOT 0.5  --   --   --   GFRNONAA 16* 16* 15*   15* 13*  ANIONGAP 9 5 7 8     Lipids No results for input(s): CHOL, TRIG, HDL, LABVLDL, LDLCALC, CHOLHDL in the last 168 hours.  Hematology Recent Labs  Lab 07/15/21 1259 07/16/21 0220  07/17/21 0258  WBC 5.2 5.7 4.9  RBC 3.48* 3.27* 3.25*  HGB 9.5* 9.1* 8.7*  HCT 29.9* 27.7* 27.6*  MCV 85.9 84.7 84.9  MCH 27.3 27.8 26.8  MCHC 31.8 32.9 31.5  RDW 14.4 14.4 14.4  PLT 277 255 270   Thyroid  Recent Labs  Lab 07/16/21 0029  TSH 1.950    BNP Recent Labs  Lab 07/15/21 1130 07/15/21 1259  BNP 638.5* 620.0*    DDimer No results for input(s): DDIMER in the last 168 hours.   Radiology    No results found.  Cardiac Studies   Echo 04/17/2021  1. Left ventricular ejection fraction, by estimation, is 55 to 60%. The  left ventricle has normal function. The left ventricle has no regional  wall motion abnormalities. There is moderate left ventricular hypertrophy.  Left ventricular diastolic  parameters are indeterminate.   2. Right ventricular systolic function is normal. The right ventricular  size is normal. There is moderately elevated pulmonary artery systolic  pressure.   3. Left atrial size was mildly dilated.   4. The mitral valve is normal in structure. No evidence of mitral valve  regurgitation. No evidence of mitral stenosis.   5. The tricuspid valve is abnormal.   6. The  aortic valve is tricuspid. Aortic valve regurgitation is not  visualized. No aortic stenosis is present.   7. The inferior vena cava is normal in size with greater than 50%  respiratory variability, suggesting right atrial pressure of 3 mmHg.  Patient Profile     55 y.o. male with a history of hypertension and type 2 diabetes mellitus, CKD stage IV, admitted with acute on chronic diastolic heart failure that did not respond to outpatient oral diuretic titration.  Assessment & Plan    CHF: Clearly still hypervolemic, but no longer with symptoms of left heart failure.  Judging by physical exam would anticipate he still has 10-15 pounds of excess fluid on board. Acute on CKD4: He does not yet have plans for permanent dialysis, but this is likely to become necessary at the most in the next 6-12 months.  Would recommend making arrangements for vascular surgery consultation at the time of discharge.  Today's labs pending still. OSA on CPAP: Explains worse findings of right, rather than left heart failure.  Reinforced compliance with CPAP. HTN: Blood pressure remains relatively high on hydralazine/labetalol.  Should improve with additional diuresis. DM/HLP: Fair glycemic control, most recent hemoglobin A1c 7.1%, little room for improvement.  Most recent lipid profile shows excellent reduction in LDL cholesterol and a good HDL.  Triglycerides high, suspect they have improved with when glycemic control improvement (the last lipid profile was drawn, the hemoglobin A1c was 9.5%).     For questions or updates, please contact [place] Please consult [email or web address] for contact info under        Signed, Sanda Klein, MD  07/18/2021, 8:55 AM

## 2021-07-18 NOTE — TOC Progression Note (Signed)
Transition of Care [facility]) - Progression Note    Patient Details  Name: [name] MRN: [id number] Date of Birth: 24-Jan-1966  Transition of Care [facility]) CM/SW Contact  Zenon Mayo, RN Phone Number: 07/18/2021, 4:16 PM  Clinical Narrative:     Transition of Care [facility]) Screening Note   Patient Details  Name: [name] Date of Birth: 12-04-1965   Transition of Care [facility]) CM/SW Contact:    Zenon Mayo, RN Phone Number: 07/18/2021, 4:16 PM    Transition of Care Department [facility]) has reviewed patient and no TOC needs have been identified at this time. We will continue to monitor patient advancement through interdisciplinary progression rounds. If new patient transition needs arise, please place a TOC consult.          Expected Discharge Plan and Services                                                 Social Determinants of Health (SDOH) Interventions    Readmission Risk Interventions Readmission Risk Prevention Plan 04/18/2021 09/27/2020  Transportation Screening Complete Complete  Home Care Screening Complete Complete  Medication Review (RN CM) Complete Complete  Some recent data might be hidden

## 2021-07-18 NOTE — Progress Notes (Incomplete)
Progress Note  Patient Name: [name] Date of Encounter: 07/18/2021  [facility] Cardiologist: Carlyle Dolly, MD ***  Subjective   ***  Inpatient Medications    Scheduled Meds:  darbepoetin (ARANESP) injection - NON-DIALYSIS  100 mcg Subcutaneous Q Mon-1800   heparin  5,000 Units Subcutaneous Q8H   hydrALAZINE  100 mg Oral TID   insulin aspart  0-6 Units Subcutaneous TID WC   labetalol  600 mg Oral BID   metolazone  5 mg Oral Daily   pravastatin  40 mg Oral Daily   Continuous Infusions:  furosemide 160 mg (07/18/21 1740)   PRN Meds: acetaminophen **OR** acetaminophen   Vital Signs    Vitals:   07/18/21 0500 07/18/21 0525 07/18/21 1138 07/18/21 1642  BP:  (!) 154/83 (!) 143/86 (!) 148/76  Pulse:  82 84 78  Resp:  18 18 16   Temp:  98.3 F (36.8 C) 97.8 F (36.6 C) 97.7 F (36.5 C)  TempSrc:  Oral Oral Oral  SpO2:  96%    Weight: 130 kg     Height:        Intake/Output Summary (Last 24 hours) at 07/18/2021 1826 Last data filed at 07/18/2021 2831 Gross per 24 hour  Intake 372 ml  Output 2550 ml  Net -2178 ml   Last 3 Weights 07/18/2021 07/17/2021 07/16/2021  Weight (lbs) 286 lb 9.6 oz 293 lb 11.2 oz 298 lb 12.8 oz  Weight (kg) 130.001 kg 133.221 kg 135.535 kg      Telemetry    *** - Personally Reviewed  ECG    *** - Personally Reviewed  Physical Exam  *** GEN: No acute distress.   Neck: No JVD Cardiac: RRR, no murmurs, rubs, or gallops.  Respiratory: Clear to auscultation bilaterally. GI: Soft, nontender, non-distended  MS: No edema; No deformity. Neuro:  Nonfocal  Psych: Normal affect   Labs    High Sensitivity Troponin:  No results for input(s): TROPONINIHS in the last 720 hours.   Chemistry Recent Labs  Lab 07/15/21 1130 07/15/21 1259 07/16/21 0029 07/17/21 0258 07/18/21 0759  NA 140   < > 141 139 140  K 5.3*   < > 4.9 5.2* 4.9  CL 112*   < > 112* 110 106  CO2 19*   < > 22 21* 23  GLUCOSE 234*   < > 121* 96  128*  BUN 62*   < > 64* 68* 73*  CREATININE 4.08*   < > 4.30*   4.31* 4.91* 5.25*  CALCIUM 8.0*   < > 8.2* 8.0* 8.3*  MG  --   --  1.9  --   --   PROT 6.0*  --   --   --   --   ALBUMIN 2.8*  --   --   --   --   AST 17  --   --   --   --   ALT 19  --   --   --   --   ALKPHOS 78  --   --   --   --   BILITOT 0.5  --   --   --   --   GFRNONAA 16*   < > 15*   15* 13* 12*  ANIONGAP 9   < > 7 8 11    < > = values in this interval not displayed.    Lipids No results for input(s): CHOL, TRIG, HDL, LABVLDL, LDLCALC, CHOLHDL in the last  168 hours.  Hematology Recent Labs  Lab 07/15/21 1259 07/16/21 0220 07/17/21 0258  WBC 5.2 5.7 4.9  RBC 3.48* 3.27* 3.25*  HGB 9.5* 9.1* 8.7*  HCT 29.9* 27.7* 27.6*  MCV 85.9 84.7 84.9  MCH 27.3 27.8 26.8  MCHC 31.8 32.9 31.5  RDW 14.4 14.4 14.4  PLT 277 255 270   Thyroid  Recent Labs  Lab 07/16/21 0029  TSH 1.950    BNP Recent Labs  Lab 07/15/21 1130 07/15/21 1259  BNP 638.5* 620.0*    DDimer No results for input(s): DDIMER in the last 168 hours.   Radiology    No results found.  Cardiac Studies   Echo 04/17/2021  1. Left ventricular ejection fraction, by estimation, is 55 to 60%. The  left ventricle has normal function. The left ventricle has no regional  wall motion abnormalities. There is moderate left ventricular hypertrophy.  Left ventricular diastolic  parameters are indeterminate.   2. Right ventricular systolic function is normal. The right ventricular  size is normal. There is moderately elevated pulmonary artery systolic  pressure.   3. Left atrial size was mildly dilated.   4. The mitral valve is normal in structure. No evidence of mitral valve  regurgitation. No evidence of mitral stenosis.   5. The tricuspid valve is abnormal.   6. The aortic valve is tricuspid. Aortic valve regurgitation is not  visualized. No aortic stenosis is present.   7. The inferior vena cava is normal in size with greater than 50%   respiratory variability, suggesting right atrial pressure of 3 mmHg.  Patient Profile     55 y.o. male with a history of hypertension and type 2 diabetes mellitus, CKD stage IV, admitted with acute on chronic diastolic heart failure that did not respond to outpatient oral diuretic titration  Assessment & Plan    #Acute on Chronic Heart Failure Exacerbation:  #Acute on CKD4:  #OSA on CPAP:  #HTN:  #HLD: {Are we signing off today?:[id number]}  For questions or updates, please contact [place] Please consult [email or web address] for contact info under        Signed, Freada Bergeron, MD  07/18/2021, 6:26 PM

## 2021-07-18 NOTE — Progress Notes (Signed)
PROGRESS NOTE  [name]  NTI:[id number] DOB: [birth year] DOA: 07/15/2021 PCP: [name], MD  Outpatient Specialists: Cardiology, Dr. Haroldine Laws Brief Narrative: [name] is a 55 y.o. male with a history of chronic HFpEF, stage IV CKD, T2DM, HTN, anemia who was referred to the ED 12/23 from the cardiology clinic due to volume overload. He had experienced >20 lbs weight gain, appeared with significant dependent edema and dyspnea with CXR confirming pulmonary congestion and pleural effusion. BNP 620, Cr near baseline at 4.1, K 5.4. IV lasix and oral metolazone was given with fair diuresis, improved symptoms. Nephrology has been consulted as the patient is bordering on requiring dialysis.   Assessment & Plan: Principal Problem:   Acute on chronic diastolic CHF (congestive heart failure) (HCC) Active Problems:   Essential hypertension   Uncontrolled type 2 diabetes mellitus with hyperglycemia (HCC)   CKD (chronic kidney disease) stage 4, GFR 15-29 ml/min (HCC)   AKI (acute kidney injury) ([place])   Acute exacerbation of CHF (congestive heart failure) ([place])  Acute on chronic HFpEF: Echo Sept 2022 w/LVEF 55-60%, G2DD. Remains grossly volume overloaded.  - Cardiology following patient. Diuresis per nephrology, lasix 160mg  TID + metolazone 5mg  daily. Wt down to 286lbs (DC weight previously 278lbs and may not be his true EDW as he appears >8lbs over at this time) - Creatinine rising, though UOP is sufficient.  - Monitor I/O, daily weights, metabolic panel closely. - Work up for cardiac amyloidosis per cardiology. PYP scat to assess for TTR amyloidosis. Myeloma panel pending  Acute hypoxic respiratory failure due to pulmonary edema due to acute CHF as above:  - Weaned from oxygen.    Stage IV CKD: SCr slightly up thus far. Follows with Dr. Theador Hawthorne ([place]) as outpatient. - Nephrology consulted, ordered vein mapping. Pt hesitant, so no vascular consultation at this time.  Chronic LLE  wound: for several months after scratching dry area. No evidence of infection, suspect diabetes and swelling are contributing to poor wound healing.  - Agree he may benefit from [employer].   HTN:  - Continue labetalol 600mg  BID, hydralazine 75mg  TID. Holding lisinopril, norvasc.   Hyperkalemia: resolved. - Continue monitoring.  - Changed to renal diet. - Holding ACE/ARB/spiro  T2DM: Well-controlled with HbA1c 7.1%.  - Continue very sensitive SSI. At inpatient goal without insulin needed. - Holding home glimepiride - Continue statin  OSA: Normal RV on echo. 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 Hgb stable ~9. - Trend CBC  Obesity: Estimated body mass index is 39.97 kg/m as calculated from the following:   Height as of this encounter: 5\' 11"  (1.803 m).   Weight as of this encounter: 130 kg. - Anticipate "improved" BMI as we diurese.  DVT prophylaxis: Heparin [place] Code Status: Full Family Communication: None at bedside Disposition Plan:  Status is: Inpatient  Remains inpatient appropriate because: requires continued IV diuresis.  Consultants:  Cardiology Nephrology  Procedures:  None  Antimicrobials: None   Subjective: Shortness of breath with exertion remains but is significantly improved. Swelling in legs is down, arms feel less tight, but still very swollen.  Objective: Vitals:   07/17/21 2250 07/18/21 0500 07/18/21 0525 07/18/21 1138  BP: (!) 155/83  (!) 154/83 (!) 143/86  Pulse: 85  82 84  Resp:   18 18  Temp:   98.3 F (36.8 C) 97.8 F (36.6 C)  TempSrc:   Oral Oral  SpO2:   96%   Weight:  130 kg  Height:        Intake/Output Summary (Last 24 hours) at 07/18/2021 1551 Last data filed at 07/18/2021 7616 Gross per 24 hour  Intake 372 ml  Output 3400 ml  Net -3028 ml   Filed Weights   07/16/21 0246 07/17/21 0526 07/18/21 0500  Weight: 135.5 kg 133.2 kg 130 kg   Gen: 55 y.o. male in no distress Pulm: Nonlabored breathing room air. Clear. CV:  Regular rate and rhythm. No murmur, rub, or gallop. UTD JVD, 2+ dependent edema. GI: Abdomen soft, non-tender, non-distended, with normoactive bowel sounds.  Ext: Warm, no deformities Skin: No new rashes, lesions or ulcers on visualized skin. Neuro: Alert and oriented. No focal neurological deficits. Psych: Judgement and insight appear fair. Mood euthymic & affect congruent. Behavior is appropriate.    Data Reviewed: I have personally reviewed following labs and imaging studies  CBC: Recent Labs  Lab 07/15/21 1130 07/15/21 1259 07/16/21 0220 07/17/21 0258  WBC 4.9 5.2 5.7 4.9  HGB 9.4* 9.5* 9.1* 8.7*  HCT 29.9* 29.9* 27.7* 27.6*  MCV 84.9 85.9 84.7 84.9  PLT 261 277 255 073   Basic Metabolic Panel: Recent Labs  Lab 07/15/21 1130 07/15/21 1259 07/16/21 0029 07/17/21 0258 07/18/21 0759  NA 140 139 141 139 140  K 5.3* 5.4* 4.9 5.2* 4.9  CL 112* 113* 112* 110 106  CO2 19* 21* 22 21* 23  GLUCOSE 234* 164* 121* 96 128*  BUN 62* 62* 64* 68* 73*  CREATININE 4.08* 4.15* 4.30*   4.31* 4.91* 5.25*  CALCIUM 8.0* 8.0* 8.2* 8.0* 8.3*  MG  --   --  1.9  --   --    GFR: Estimated Creatinine Clearance: 21.6 mL/min (A) (by C-G formula based on SCr of 5.25 mg/dL (H)). Liver Function Tests: Recent Labs  Lab 07/15/21 1130  AST 17  ALT 19  ALKPHOS 78  BILITOT 0.5  PROT 6.0*  ALBUMIN 2.8*   No results for input(s): LIPASE, AMYLASE in the last 168 hours. No results for input(s): AMMONIA in the last 168 hours. Coagulation Profile: No results for input(s): INR, PROTIME in the last 168 hours. Cardiac Enzymes: No results for input(s): CKTOTAL, CKMB, CKMBINDEX, TROPONINI in the last 168 hours. BNP (last 3 results) No results for input(s): PROBNP in the last 8760 hours. HbA1C: No results for input(s): HGBA1C in the last 72 hours.  CBG: Recent Labs  Lab 07/17/21 1157 07/17/21 1633 07/17/21 2124 07/18/21 0658 07/18/21 1141  GLUCAP 131* 130* 156* 110* 130*   Lipid  Profile: No results for input(s): CHOL, HDL, LDLCALC, TRIG, CHOLHDL, LDLDIRECT in the last 72 hours. Thyroid Function Tests: Recent Labs    07/16/21 0029  TSH 1.950   Anemia Panel: No results for input(s): VITAMINB12, FOLATE, FERRITIN, TIBC, IRON, RETICCTPCT in the last 72 hours. Urine analysis:    Component Value Date/Time   COLORURINE YELLOW 06/14/2020 [place] 06/14/2020 1452   LABSPEC 1.010 06/14/2020 1452   PHURINE 5.0 06/14/2020 1452   GLUCOSEU NEGATIVE 06/14/2020 1452   HGBUR NEGATIVE 06/14/2020 1452   BILIRUBINUR NEGATIVE 06/14/2020 1452   KETONESUR NEGATIVE 06/14/2020 1452   PROTEINUR 100 (A) 06/14/2020 1452   NITRITE NEGATIVE 06/14/2020 1452   LEUKOCYTESUR NEGATIVE 06/14/2020 1452   Recent Results (from the past 240 hour(s))  Resp Panel by RT-PCR (Flu A&B, Covid) Nasopharyngeal Swab     Status: None   Collection Time: 07/15/21  1:00 PM   Specimen: Nasopharyngeal Swab; Nasopharyngeal(NP) swabs in vial  transport medium  Result Value Ref Range Status   SARS Coronavirus 2 by RT PCR NEGATIVE NEGATIVE Final    Comment: (NOTE) SARS-CoV-2 target nucleic acids are NOT DETECTED.  The SARS-CoV-2 RNA is generally detectable in upper respiratory specimens during the acute phase of infection. The lowest concentration of SARS-CoV-2 viral copies this assay can detect is 138 copies/mL. A negative result does not preclude SARS-Cov-2 infection and should not be used as the sole basis for treatment or other patient management decisions. A negative result may occur with  improper specimen collection/handling, submission of specimen other than nasopharyngeal swab, presence of viral mutation(s) within the areas targeted by this assay, and inadequate number of viral copies(<138 copies/mL). A negative result must be combined with clinical observations, patient history, and epidemiological information. The expected result is Negative.  Fact Sheet for Patients:   EntrepreneurPulse.com.au  Fact Sheet for Healthcare Providers:  IncredibleEmployment.be  This test is no t yet approved or cleared by the [place] FDA and  has been authorized for detection and/or diagnosis of SARS-CoV-2 by FDA under an Emergency Use Authorization (EUA). This EUA will remain  in effect (meaning this test can be used) for the duration of the COVID-19 declaration under Section 564(b)(1) of the Act, 21 U.S.C.section 360bbb-3(b)(1), unless the authorization is terminated  or revoked sooner.       Influenza A by PCR NEGATIVE NEGATIVE Final   Influenza B by PCR NEGATIVE NEGATIVE Final    Comment: (NOTE) The Xpert Xpress SARS-CoV-2/FLU/RSV plus assay is intended as an aid in the diagnosis of influenza from Nasopharyngeal swab specimens and should not be used as a sole basis for treatment. Nasal washings and aspirates are unacceptable for Xpert Xpress SARS-CoV-2/FLU/RSV testing.  Fact Sheet for Patients: EntrepreneurPulse.com.au  Fact Sheet for Healthcare Providers: IncredibleEmployment.be  This test is not yet approved or cleared by the [place] FDA and has been authorized for detection and/or diagnosis of SARS-CoV-2 by FDA under an Emergency Use Authorization (EUA). This EUA will remain in effect (meaning this test can be used) for the duration of the COVID-19 declaration under Section 564(b)(1) of the Act, 21 U.S.C. section 360bbb-3(b)(1), unless the authorization is terminated or revoked.  Performed at [facility], [address] [place],  [address]       Radiology Studies: No results found.  Scheduled Meds:  darbepoetin (ARANESP) injection - NON-DIALYSIS  100 mcg Subcutaneous Q Mon-1800   heparin  5,000 Units Subcutaneous Q8H   hydrALAZINE  100 mg Oral TID   insulin aspart  0-6 Units Subcutaneous TID WC   labetalol  600 mg Oral BID   metolazone  5 mg Oral  Daily   pravastatin  40 mg Oral Daily   Continuous Infusions:  furosemide 160 mg (07/18/21 0942)     LOS: 3 days   Time spent: 35 minutes.  Patrecia Pour, MD [facility] [email or web address] 07/18/2021, 3:51 PM

## 2021-07-18 NOTE — Progress Notes (Addendum)
°  Transition of Care [facility]) Screening Note   Patient Details  Name: [name] Date of Birth: [birth year]   Transition of Care [facility]) CM/SW Contact:    [place], LCSW Phone Number: 07/18/2021, 10:18 AM    Transition of Care Department [facility]) has reviewed patient and no TOC needs have been identified at this time. Patient will benefit from PT/OT consult for disposition recommendations. We will continue to monitor patient advancement through interdisciplinary progression rounds. If new patient transition needs arise, please place a TOC consult.  Per mobility specialist Mr. Odonohue will need assistance with obtaining a rollator/walker before discharge and RNCM notified/aware.

## 2021-07-18 NOTE — Progress Notes (Signed)
Mobility Specialist Progress Note    07/18/21 1224  Mobility  Activity Ambulated in hall  Level of Assistance Standby assist, set-up cues, supervision of patient - no hands on  Assistive Device Four wheel walker  Distance Ambulated (ft) 320 ft  Mobility Ambulated with assistance in hallway  Mobility Response Tolerated well  Mobility performed by Mobility specialist  $Mobility charge 1 Mobility   Pt received sitting EOB and agreeable. Utilized a rollator and pt felt better having something to lean on. Pt stated his breathing felt much better and that he was not SOB when getting back into the room. Left sitting EOB with call bell in reach.   [facility] Mobility Specialist  M.S. Primary Phone: [phone] M.S. Secondary Phone: [phone]

## 2021-07-19 DIAGNOSIS — E1165 Type 2 diabetes mellitus with hyperglycemia: Secondary | ICD-10-CM | POA: Diagnosis not present

## 2021-07-19 DIAGNOSIS — I5033 Acute on chronic diastolic (congestive) heart failure: Secondary | ICD-10-CM | POA: Diagnosis not present

## 2021-07-19 DIAGNOSIS — I10 Essential (primary) hypertension: Secondary | ICD-10-CM | POA: Diagnosis not present

## 2021-07-19 DIAGNOSIS — N184 Chronic kidney disease, stage 4 (severe): Secondary | ICD-10-CM | POA: Diagnosis not present

## 2021-07-19 LAB — BASIC METABOLIC PANEL
Anion gap: 11 (ref 5–15)
BUN: 80 mg/dL — ABNORMAL HIGH (ref 6–20)
CO2: 22 mmol/L (ref 22–32)
Calcium: 8.5 mg/dL — ABNORMAL LOW (ref 8.9–10.3)
Chloride: 106 mmol/L (ref 98–111)
Creatinine, Ser: 5.51 mg/dL — ABNORMAL HIGH (ref 0.61–1.24)
GFR, Estimated: 11 mL/min — ABNORMAL LOW (ref >60)
Glucose, Bld: 113 mg/dL — ABNORMAL HIGH (ref 70–99)
Potassium: 5.2 mmol/L — ABNORMAL HIGH (ref 3.5–5.1)
Sodium: 139 mmol/L (ref 135–145)

## 2021-07-19 LAB — IRON AND TIBC
Iron: 79 ug/dL (ref 45–182)
Saturation Ratios: 27 % (ref 17.9–39.5)
TIBC: 293 ug/dL (ref 250–450)
UIBC: 214 ug/dL

## 2021-07-19 LAB — GLUCOSE, CAPILLARY
Glucose-Capillary: 131 mg/dL — ABNORMAL HIGH (ref 70–99)
Glucose-Capillary: 132 mg/dL — ABNORMAL HIGH (ref 70–99)
Glucose-Capillary: 150 mg/dL — ABNORMAL HIGH (ref 70–99)
Glucose-Capillary: 157 mg/dL — ABNORMAL HIGH (ref 70–99)

## 2021-07-19 LAB — FERRITIN: Ferritin: 99 ng/mL (ref 24–336)

## 2021-07-19 MED ORDER — SODIUM CHLORIDE 0.9 % IV SOLN
250.0000 mg | Freq: Every day | INTRAVENOUS | Status: AC
  Administered 2021-07-19 – 2021-07-22 (×4): 250 mg via INTRAVENOUS
  Filled 2021-07-19 (×4): qty 20

## 2021-07-19 NOTE — Progress Notes (Signed)
Patient refused CPAP hs tonight. Patient states he does not wear one at home.

## 2021-07-19 NOTE — Progress Notes (Signed)
Pt has refused cpap for three nights.  Order will now bw d/c'd.  RT will monitor.

## 2021-07-19 NOTE — Progress Notes (Signed)
[facility] Progress Note  Name: [name] MRN: [id number] DOB: 1966/04/05   Subjective:  He had another at least 1.8 liters UOP (missed shift) .   His legs feel much less tight, weight trending down - crt is climbing-  now 5.5-  last albumin 2.8-  no uremic sxms  --------------- Background on consult:  SAAHIL [name] is a 55 y.o. 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  Note that on 06/24/2021 his metolazone was increased to 5 mg 5 days a week given persistent overload at that time per charting.  When he was seen in heart failure follow-up clinic on 12/23 his weight was noted to be up more than 20 pounds and he was directed to the hospital for IV diuretics.  Dr. Haroldine Laws recommended Lasix 160 mg IV every 6 hours plus metolazone 5 mg daily with the knowledge that he may need to start dialysis if he does not respond.  Cardiology is also planning to rule out amyloid as well.  Creatinine trends as below.  700 and mils of urine recorded on 12/23 and 1.3 L as well as one unmeasured urine void reported today.  He follows with Dr. Theador Hawthorne.  Per nephrology notes his metolazone was increased to 5 mg three times a week at the 06/08/21 office appointment.  He does think that his thighs don't feel tight since yesterday.  He would want dialysis if it were needed - he thought was going to have to start when GFR was 12 but it got better.  He does not have access. Short of breath after ambulating to restroom - just had diarrhea.    Intake/Output Summary (Last 24 hours) at 07/19/2021 1029 Last data filed at 07/19/2021 1026 Gross per 24 hour  Intake 318 ml  Output 1850 ml  Net -1532 ml    Vitals:  Vitals:   07/18/21 2022 07/19/21 0544 07/19/21 0547 07/19/21 0834  BP: 135/71  (!) 157/89 134/76  Pulse: 78   82  Resp: 16  17  18   Temp: 97.8 F (36.6 C)  98 F (36.7 C) 98 F (36.7 C)  TempSrc: Oral  Oral Oral  SpO2: 96%     Weight:  128.8 kg    Height:         Physical Exam:   General: adult male in bed in NAD at rest HEENT: NCAT Eyes: EOMI sclera anicteric Neck: Supple trachea midline Heart: S1S2 no rub Lungs: basilar crackles; unlabored at rest on room air  Abdomen: softly distended /nt [name] habitus Extremities: 3-4+ pitting edema bilateral lower extremities Skin: chronic venous stasis changes Neuro: alert and oriented x 3 provides hx and follows commands Pysch normal mood and affect GU no foley  Medications reviewed   Labs:  BMP Latest Ref Rng & Units 07/19/2021 07/18/2021 07/17/2021  Glucose 70 - 99 mg/dL 113(H) 128(H) 96  BUN 6 - 20 mg/dL 80(H) 73(H) 68(H)  Creatinine 0.61 - 1.24 mg/dL 5.51(H) 5.25(H) 4.91(H)  BUN/Creat Ratio 9 - 20 - - -  Sodium 135 - 145 mmol/L 139 140 139  Potassium 3.5 - 5.1 mmol/L 5.2(H) 4.9 5.2(H)  Chloride 98 - 111 mmol/L 106 106 110  CO2 22 - 32 mmol/L 22 23 21(L)  Calcium 8.9 - 10.3 mg/dL 8.5(L) 8.3(L) 8.0(L)     Assessment/Plan:   # Acute  on chronic diastolic CHF  - Continue lasix 160 mg IV every 8 hours and continue metolazone daily - note weight 139.7 kg on 12/23 for reference-  is down to 128.8 but still has edema    # CKD stage IV  - Cr was 3.74 with GFR 18 in November 2022 and in September GFR below 15 - If fluid is not able to be managed with diuretics would need initiation of hemodialysis - holding home lisinopril and amlodipine - changed to renal DM diet with fluid restriction 1.2 liters/day - He follows with an outside practice, Dr. Theador Hawthorne [facility] -  needs access-  I talked to patient about this-  he is hesitant-  will at least do vein mapping and keep asking him if OK to get vascular consult-  feel that diuresis is just un masking his severe CKD-  he might not need it just now but is getting very close and  anticipate volume will continue to be difficult to control -  albumin is below 3 which is telling as well.  Asks about kidney transplant-   says has known people who have died after starting dialysis- attempted to talk thru these issues     # HTN  - optimize volume status with diuretics as above - stop amlodipine for now and hope to be able to hold if clouds volume assessment    # OSA - note decreased respiratory reserve   # DM type 2 - per primary team   - Anemia-  iron stores just a little low, will replete and have add ESA   Hyperkalemia-  suspect will dec with high dose lasix he is on -  will follow      Louis Meckel, MD 07/19/2021 10:29 AM

## 2021-07-19 NOTE — Progress Notes (Signed)
Orthopedic Tech Progress Note Patient Details:  [name] [birth year] [id number]  Ortho Devices Type of Ortho Device: Haematologist Ortho Device/Splint Location: BLE Ortho Device/Splint Interventions: Ordered, Application, Adjustment   Post Interventions Patient Tolerated: Well Instructions Provided: Care of device  [name] 07/19/2021, 10:59 AM

## 2021-07-19 NOTE — Progress Notes (Addendum)
Advanced Heart Failure Rounding Note  PCP-Cardiologist: Carlyle Dolly, MD   Subjective:    On high dose IV Lasix 160 mg q8H.  1.5L in UOP yesterday. Overall, net negative 8L since admit. Wt down 24 lb.   Scr continues to trend up, 4.91>>5.25>>5.51. BUN 80. K 5.2.  Nephrology following.   Remains markedly fluid overloaded. No resting dyspnea but c/w DOE and orthopnea. No chest pain.   Objective:   Weight Range: 128.8 kg Body mass index is 39.61 kg/m.   Vital Signs:   Temp:  [97.7 F (36.5 C)-98 F (36.7 C)] 98 F (36.7 C) (12/27 0547) Pulse Rate:  [78-84] 78 (12/26 2022) Resp:  [16-18] 17 (12/27 0547) BP: (135-157)/(71-89) 157/89 (12/27 0547) SpO2:  [96 %] 96 % (12/26 2022) Weight:  [128.8 kg] 128.8 kg (12/27 0544) Last BM Date: 07/16/21  Weight change: Filed Weights   07/17/21 0526 07/18/21 0500 07/19/21 0544  Weight: 133.2 kg 130 kg 128.8 kg    Intake/Output:   Intake/Output Summary (Last 24 hours) at 07/19/2021 0832 Last data filed at 07/19/2021 0546 Gross per 24 hour  Intake 132 ml  Output 1850 ml  Net -1718 ml      Physical Exam    General:  Well appearing, obese, sitting up on side of bed. No resp difficulty HEENT: Normal Neck: Supple. JVP elevated to ear . Carotids 2+ bilat; no bruits. No lymphadenopathy or thyromegaly appreciated. Cor: PMI nondisplaced. Regular rate & rhythm. No rubs, gallops or murmurs. Lungs: decreased BS at the bases bilaterally  Abdomen: obese, soft, nontender, nondistended. No hepatosplenomegaly. No bruits or masses. Good bowel sounds. Extremities: No cyanosis, clubbing, rash, 3+ bilateral LE pitting edema Neuro: Alert & orientedx3, cranial nerves grossly intact. moves all 4 extremities w/o difficulty. Affect pleasant   Telemetry   NSR 90s   EKG    No new EKG to review   Labs    CBC Recent Labs    07/17/21 0258  WBC 4.9  HGB 8.7*  HCT 27.6*  MCV 84.9  PLT 409   Basic Metabolic Panel Recent Labs     07/18/21 0759 07/19/21 0323  NA 140 139  K 4.9 5.2*  CL 106 106  CO2 23 22  GLUCOSE 128* 113*  BUN 73* 80*  CREATININE 5.25* 5.51*  CALCIUM 8.3* 8.5*   Liver Function Tests No results for input(s): AST, ALT, ALKPHOS, BILITOT, PROT, ALBUMIN in the last 72 hours. No results for input(s): LIPASE, AMYLASE in the last 72 hours. Cardiac Enzymes No results for input(s): CKTOTAL, CKMB, CKMBINDEX, TROPONINI in the last 72 hours.  BNP: BNP (last 3 results) Recent Labs    06/24/21 1250 07/15/21 1130 07/15/21 1259  BNP 615.1* 638.5* 620.0*    ProBNP (last 3 results) No results for input(s): PROBNP in the last 8760 hours.   D-Dimer No results for input(s): DDIMER in the last 72 hours. Hemoglobin A1C No results for input(s): HGBA1C in the last 72 hours. Fasting Lipid Panel No results for input(s): CHOL, HDL, LDLCALC, TRIG, CHOLHDL, LDLDIRECT in the last 72 hours. Thyroid Function Tests No results for input(s): TSH, T4TOTAL, T3FREE, THYROIDAB in the last 72 hours.  Invalid input(s): FREET3  Other results:   Imaging    No results found.   Medications:     Scheduled Medications:  darbepoetin (ARANESP) injection - NON-DIALYSIS  100 mcg Subcutaneous Q Mon-1800   heparin  5,000 Units Subcutaneous Q8H   hydrALAZINE  100 mg Oral TID  insulin aspart  0-6 Units Subcutaneous TID WC   labetalol  600 mg Oral BID   metolazone  5 mg Oral Daily   pravastatin  40 mg Oral Daily    Infusions:  furosemide 160 mg (07/19/21 0211)    PRN Medications: acetaminophen **OR** acetaminophen     Assessment/Plan    1. Acute on Chronic diastolic CHF: Most recent echo in 9/22 showed EF 56-60%, moderate LVH, grade 2 diastolic dysfunction, normal RV size and systolic function.  Multiple admissions for CHF.  Cardiac amyloidosis (most likely transthyretin) is a concern, myeloma panel negative. Now admitted w/ a/c CHF w/ marked fluid overload with NYHA class IIIb-IV symptoms, c/b worsening  Stage IV CKD. SCr now up to 5.5 (previous baseline ~4.1). He is diuresing w/ high dose lasix, but slowly. Remains markedly fluid overloaded. Nephrology following.  - Continue IV Lasix 160 mg q8H per nephrology + metolazone 5 mg daily.   - May be nearing need for initiation of HD - Apply UNNA boots. - Cannot use Jardiance or ARB/ARNi with elevated creatinine.  - Now that he has insurance, PYP scan arranged to assess for TTR cardiac amyloidosis. Will complete while inpatient    2. AKI on CKD stage 4: Pre admit baseline Scr ~ 4.18. Now trending up, 4.91>>5.25>>5.51 - He followed with Dr. Theador Hawthorne outpatient. - inpatient nephrology team now following and helping to dose diuretics  - nonoliguric, but may be nearing HD  - nephrology planing vein mapping for HD access    3. OSA: will order CPAP.    4. HTN: controlled on current regimen  - Continue labetalol 600 mg bid for now. - Continue hydralazine 100 mg tid.  - Off lisinopril with hyperkalemia   5. DM2 - cover with SSI   6. Hyperkalemia - K 5.2 today, in setting of worsening renal fx  - continue w/ IV Lasix per nephrology  - Lokelma PRN    Length of Stay: [address] PA-C  07/19/2021, 8:32 AM  Advanced Heart Failure Team Pager [phone] (M-F; 7a - 5p)  Please contact [facility] for night-coverage after hours (5p -7a ) and weekends on [email or web address]  Patient seen with PA, agree with the above note.   Some diuresis but slow on high dose IV Lasix and metolazone.  Creatinine mildly higher at 5.51.  Still with very swollen legs.   General: NAD Neck: JVP difficult but appears elevated, no thyromegaly or thyroid nodule.  Lungs: Clear to auscultation bilaterally with normal respiratory effort. CV: Nondisplaced PMI.  Heart regular S1/S2, no S3/S4, no murmur.  2+ edema to thighs.  Abdomen: Soft, nontender, no hepatosplenomegaly, no distention.  Skin: Intact without lesions or rashes.  Neurologic: Alert and oriented x 3.   Psych: Normal affect. Extremities: No clubbing or cyanosis.  HEENT: Normal.   Still volume overloaded, diuresis impeded by AKI on CKD stage IV.   - Continue current IV Lasix and metolazone per nephrology.  - I am concerned that he is reaching the point where he will need dialysis for volume management.  - Needs PYP scan to assess for ATTR cardiac amyloidosis.  - Place Unna boots.  - Hopefully K will decrease with today's diuresis.   Loralie Champagne 07/19/2021 9:26 AM

## 2021-07-19 NOTE — Evaluation (Addendum)
Physical Therapy Evaluation Patient Details Name: [name] MRN: [id number] DOB: 29-Oct-1965 Today's Date: 07/19/2021  History of Present Illness  Pt is a 55 y.o. M who presents 07/15/2021 with volume overload. Significant PMH: chronic HFpEF, stage IV CKD, T2DM, HTN, anemia.  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 Pt presents with decreased cardiopulmonary endurance weakness, and mild balance deficits. Pt ambulating 150 feet with no assistive device at a supervision level and negotiated 6 steps with a railing. He required several standing rest breaks, with dyspnea on exertion. SpO2 95% on RA, HR 77-81 bpm, BP 116/63 post walk. Education provided regarding HF signs/symptoms, daily weights, use of Rollator (pt has one at home) and activity recommendations. He would benefit from follow up HHPT to address deficits.     Recommendations for follow up therapy are one component of a multi-disciplinary discharge planning process, led by the attending physician.  Recommendations may be updated based on patient status, additional functional criteria and insurance authorization.  Follow Up Recommendations Home health PT    Assistance Recommended at Discharge PRN  Functional Status Assessment Patient has had a recent decline in their functional status and demonstrates the ability to make significant improvements in function in a reasonable and predictable amount of time.  Equipment Recommendations  None recommended by PT    Recommendations for Other Services       Precautions / Restrictions Precautions Precautions: Fall Restrictions Weight Bearing Restrictions: No      Mobility  Bed Mobility               General bed mobility comments: Sitting EOB upon arrival    Transfers Overall transfer level: Modified independent Equipment used: None                    Ambulation/Gait Ambulation/Gait assistance: Supervision Gait  Distance (Feet): 150 Feet Assistive device: None Gait Pattern/deviations: Step-through pattern;Decreased stride length Gait velocity: decreased Gait velocity interpretation: <1.8 ft/sec, indicate of risk for recurrent falls   General Gait Details: Slow and effortful, no gross imbalance noted  Stairs Stairs: Yes Stairs assistance: Min guard Stair Management: One rail Left Number of Stairs: 6 General stair comments: cues for step by step pattern for energy conservation  Wheelchair Mobility    Modified Rankin (Stroke Patients Only)       Balance Overall balance assessment: Mild deficits observed, not formally tested                                           Pertinent Vitals/Pain Pain Assessment: No/denies pain    Home Living Family/patient expects to be discharged to:: Private residence Living Arrangements: Spouse/significant other Available Help at Discharge: Family Type of Home: House Home Access: Stairs to enter   Technical brewer of Steps: 9   Home Layout: Two level;Able to live on main level with bedroom/bathroom Home Equipment: Air cabin crew (4 wheels) (DME is from family member)      Prior Function Prior Level of Function : Independent/Modified Independent;Driving                     Hand Dominance        Extremity/Trunk Assessment   Upper Extremity Assessment Upper Extremity Assessment: Overall WFL for tasks assessed    Lower Extremity Assessment Lower Extremity Assessment: LLE deficits/detail;RLE deficits/detail RLE  Deficits / Details: Increased edema LLE Deficits / Details: Increased edema       Communication   Communication: No difficulties  Cognition Arousal/Alertness: Awake/alert Behavior During Therapy: WFL for tasks assessed/performed Overall Cognitive Status: Within Functional Limits for tasks assessed                                          General Comments      Exercises      Assessment/Plan    PT Assessment Patient needs continued PT services  PT Problem List Decreased strength;Decreased activity tolerance;Decreased balance;Decreased mobility;Cardiopulmonary status limiting activity       PT Treatment Interventions DME instruction;Gait training;Functional mobility training;Therapeutic activities;Stair training;Patient/family education;Therapeutic exercise;Balance training    PT Goals (Current goals can be found in the Care Plan section)  Acute Rehab PT Goals Patient Stated Goal: be able to go out to eat with family PT Goal Formulation: With patient Time For Goal Achievement: 08/02/21 Potential to Achieve Goals: Good    Frequency Min 3X/week   Barriers to discharge        Co-evaluation               AM-PAC PT "6 Clicks" Mobility  Outcome Measure Help needed turning from your back to your side while in a flat bed without using bedrails?: None Help needed moving from lying on your back to sitting on the side of a flat bed without using bedrails?: None Help needed moving to and from a bed to a chair (including a wheelchair)?: None Help needed standing up from a chair using your arms (e.g., wheelchair or bedside chair)?: None Help needed to walk in hospital room?: A Little Help needed climbing 3-5 steps with a railing? : A Little 6 Click Score: 22    End of Session   Activity Tolerance: Patient tolerated treatment well Patient left: in bed;with call bell/phone within reach Nurse Communication: Mobility status PT Visit Diagnosis: Unsteadiness on feet (R26.81);Difficulty in walking, not elsewhere classified (R26.2)    Time: 8280-0349 PT Time Calculation (min) (ACUTE ONLY): 19 min   Charges:   PT Evaluation $PT Eval Moderate Complexity: [place], PT, DPT Acute Rehabilitation Services Pager [phone] Office [phone]   Deno Etienne 07/19/2021, 1:15 PM

## 2021-07-19 NOTE — Progress Notes (Signed)
PROGRESS NOTE  [name]  EXB:[id number] DOB: [birth year] DOA: 07/15/2021 PCP: [name], MD  Outpatient Specialists: Cardiology, Dr. Haroldine Laws Brief Narrative: [name] is a 55 y.o. male with a history of chronic HFpEF, stage IV CKD, T2DM, HTN, anemia who was referred to the ED 12/23 from the cardiology clinic due to volume overload. He had experienced >20 lbs weight gain, appeared with significant dependent edema and dyspnea with CXR confirming pulmonary congestion and pleural effusion. BNP 620, Cr near baseline at 4.1, K 5.4. IV lasix and oral metolazone was given with fair diuresis, improved symptoms. Nephrology has been consulted as the patient is bordering on requiring dialysis.   Assessment & Plan: Principal Problem:   Acute on chronic diastolic CHF (congestive heart failure) (HCC) Active Problems:   Essential hypertension   Uncontrolled type 2 diabetes mellitus with hyperglycemia (HCC)   CKD (chronic kidney disease) stage 4, GFR 15-29 ml/min (HCC)   AKI (acute kidney injury) ([place])   Acute exacerbation of CHF (congestive heart failure) ([place])  Acute on chronic HFpEF: Echo Sept 2022 w/LVEF 55-60%, G2DD. Remains grossly volume overloaded.  - Cardiology following patient. Diuresis per nephrology, lasix 160mg  TID + metolazone 5mg  daily. Wt down to further today to 283lbs (DC weight previously 278lbs and may not be his true EDW) - Creatinine rising, though still with UOP, no uremic symptoms. - Monitor I/O, daily weights, metabolic panel closely. - Work up for cardiac amyloidosis per cardiology. PYP scat to assess for TTR amyloidosis. Myeloma panel pending  Acute hypoxic respiratory failure due to pulmonary edema due to acute CHF as above:  - Weaned from oxygen.    Stage IV CKD: SCr slightly up thus far. Follows with Dr. Theador Hawthorne ([facility]) as outpatient. - Nephrology consulted, ordered vein mapping. Pt hesitant, so no vascular consultation at this time. He's concerned both  about HD cath placement and about starting HD.   Chronic LLE wound: for several months after scratching dry area. No evidence of infection, suspect diabetes and swelling are contributing to poor wound healing.  - Unna boots applied 12/27  HTN:  - Continue labetalol 600mg  BID, hydralazine 75mg  TID. Holding lisinopril, norvasc.   Hyperkalemia: Intermittent. - Continue monitoring.  - Changed to renal diet. - Holding ACE/ARB/spiro - Loop diuretic, metolazone as above.  T2DM: Fairly well-controlled with HbA1c 7.1%.  - Continue very sensitive SSI. At inpatient goal without insulin needed. - Holding home glimepiride - Continue statin  OSA: Normal RV on echo. - CPAP qHS has been declined every night since arrival. He does not wear this at home.  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 Hgb stable ~9. - Trend CBC  Obesity: Estimated body mass index is 39.61 kg/m as calculated from the following:   Height as of this encounter: 5\' 11"  (1.803 m).   Weight as of this encounter: 128.8 kg. - Anticipate "improved" BMI as we diurese.  DVT prophylaxis: Heparin [place] Code Status: Full Family Communication: None at bedside Disposition Plan:  Status is: Inpatient  Remains inpatient appropriate because: requires continued IV diuresis.  Consultants:  Cardiology Nephrology  Procedures:  None  Antimicrobials: None   Subjective: Nervous about possible need for HD. Breathing better, still very swollen.  Objective: Vitals:   07/19/21 0544 07/19/21 0547 07/19/21 0834 07/19/21 1134  BP:  (!) 157/89 134/76 139/69  Pulse:   82 76  Resp:  17 18 17   Temp:  98 F (36.7 C) 98 F (36.7 C) 97.7 F (36.5 C)  TempSrc:  Oral Oral  Oral  SpO2:      Weight: 128.8 kg     Height:        Intake/Output Summary (Last 24 hours) at 07/19/2021 1321 Last data filed at 07/19/2021 1300 Gross per 24 hour  Intake 624.49 ml  Output 2750 ml  Net -2125.51 ml   Filed Weights   07/17/21 0526 07/18/21 0500 07/19/21 0544   Weight: 133.2 kg 130 kg 128.8 kg   Gen: 55 y.o. male in no distress Pulm: Nonlabored breathing room air. Clear. CV: Regular rate and rhythm. No murmur, rub, or gallop. UTD JVD, noStable significant dependent edema. GI: Abdomen soft, non-tender, non-distended, with normoactive bowel sounds.  Ext: Warm, no deformities Skin: No new rashes, lesions or ulcers on visualized skin. Neuro: Alert and oriented. No focal neurological deficits. Psych: Judgement and insight appear fair. Mood euthymic & affect congruent. Behavior is appropriate.    Data Reviewed: I have personally reviewed following labs and imaging studies  CBC: Recent Labs  Lab 07/15/21 1130 07/15/21 1259 07/16/21 0220 07/17/21 0258  WBC 4.9 5.2 5.7 4.9  HGB 9.4* 9.5* 9.1* 8.7*  HCT 29.9* 29.9* 27.7* 27.6*  MCV 84.9 85.9 84.7 84.9  PLT 261 277 255 295   Basic Metabolic Panel: Recent Labs  Lab 07/15/21 1259 07/16/21 0029 07/17/21 0258 07/18/21 0759 07/19/21 0323  NA 139 141 139 140 139  K 5.4* 4.9 5.2* 4.9 5.2*  CL 113* 112* 110 106 106  CO2 21* 22 21* 23 22  GLUCOSE 164* 121* 96 128* 113*  BUN 62* 64* 68* 73* 80*  CREATININE 4.15* 4.30*   4.31* 4.91* 5.25* 5.51*  CALCIUM 8.0* 8.2* 8.0* 8.3* 8.5*  MG  --  1.9  --   --   --    GFR: Estimated Creatinine Clearance: 20.5 mL/min (A) (by C-G formula based on SCr of 5.51 mg/dL (H)). Liver Function Tests: Recent Labs  Lab 07/15/21 1130  AST 17  ALT 19  ALKPHOS 78  BILITOT 0.5  PROT 6.0*  ALBUMIN 2.8*   No results for input(s): LIPASE, AMYLASE in the last 168 hours. No results for input(s): AMMONIA in the last 168 hours. Coagulation Profile: No results for input(s): INR, PROTIME in the last 168 hours. Cardiac Enzymes: No results for input(s): CKTOTAL, CKMB, CKMBINDEX, TROPONINI in the last 168 hours. BNP (last 3 results) No results for input(s): PROBNP in the last 8760 hours. HbA1C: No results for input(s): HGBA1C in the last 72 hours.  CBG: Recent  Labs  Lab 07/18/21 1141 07/18/21 1645 07/18/21 2137 07/19/21 0730 07/19/21 1139  GLUCAP 130* 116* 137* 131* 132*   Lipid Profile: No results for input(s): CHOL, HDL, LDLCALC, TRIG, CHOLHDL, LDLDIRECT in the last 72 hours. Thyroid Function Tests: No results for input(s): TSH, T4TOTAL, FREET4, T3FREE, THYROIDAB in the last 72 hours.  Anemia Panel: Recent Labs    07/19/21 0323  FERRITIN 99  TIBC 293  IRON 79   Urine analysis:    Component Value Date/Time   COLORURINE YELLOW 06/14/2020 [place] 06/14/2020 1452   LABSPEC 1.010 06/14/2020 1452   PHURINE 5.0 06/14/2020 1452   GLUCOSEU NEGATIVE 06/14/2020 1452   HGBUR NEGATIVE 06/14/2020 1452   BILIRUBINUR NEGATIVE 06/14/2020 1452   KETONESUR NEGATIVE 06/14/2020 1452   PROTEINUR 100 (A) 06/14/2020 1452   NITRITE NEGATIVE 06/14/2020 1452   LEUKOCYTESUR NEGATIVE 06/14/2020 1452   Recent Results (from the past 240 hour(s))  Resp Panel by RT-PCR (Flu A&B, Covid) Nasopharyngeal Swab  Status: None   Collection Time: 07/15/21  1:00 PM   Specimen: Nasopharyngeal Swab; Nasopharyngeal(NP) swabs in vial transport medium  Result Value Ref Range Status   SARS Coronavirus 2 by RT PCR NEGATIVE NEGATIVE Final    Comment: (NOTE) SARS-CoV-2 target nucleic acids are NOT DETECTED.  The SARS-CoV-2 RNA is generally detectable in upper respiratory specimens during the acute phase of infection. The lowest concentration of SARS-CoV-2 viral copies this assay can detect is 138 copies/mL. A negative result does not preclude SARS-Cov-2 infection and should not be used as the sole basis for treatment or other patient management decisions. A negative result may occur with  improper specimen collection/handling, submission of specimen other than nasopharyngeal swab, presence of viral mutation(s) within the areas targeted by this assay, and inadequate number of viral copies(<138 copies/mL). A negative result must be combined  with clinical observations, patient history, and epidemiological information. The expected result is Negative.  Fact Sheet for Patients:  EntrepreneurPulse.com.au  Fact Sheet for Healthcare Providers:  IncredibleEmployment.be  This test is no t yet approved or cleared by the [place] FDA and  has been authorized for detection and/or diagnosis of SARS-CoV-2 by FDA under an Emergency Use Authorization (EUA). This EUA will remain  in effect (meaning this test can be used) for the duration of the COVID-19 declaration under Section 564(b)(1) of the Act, 21 U.S.C.section 360bbb-3(b)(1), unless the authorization is terminated  or revoked sooner.       Influenza A by PCR NEGATIVE NEGATIVE Final   Influenza B by PCR NEGATIVE NEGATIVE Final    Comment: (NOTE) The Xpert Xpress SARS-CoV-2/FLU/RSV plus assay is intended as an aid in the diagnosis of influenza from Nasopharyngeal swab specimens and should not be used as a sole basis for treatment. Nasal washings and aspirates are unacceptable for Xpert Xpress SARS-CoV-2/FLU/RSV testing.  Fact Sheet for Patients: EntrepreneurPulse.com.au  Fact Sheet for Healthcare Providers: IncredibleEmployment.be  This test is not yet approved or cleared by the [place] FDA and has been authorized for detection and/or diagnosis of SARS-CoV-2 by FDA under an Emergency Use Authorization (EUA). This EUA will remain in effect (meaning this test can be used) for the duration of the COVID-19 declaration under Section 564(b)(1) of the Act, 21 U.S.C. section 360bbb-3(b)(1), unless the authorization is terminated or revoked.  Performed at [facility], [place] [address] [place], [place] [id number]       Radiology Studies: No results found.  Scheduled Meds:  darbepoetin (ARANESP) injection - NON-DIALYSIS  100 mcg Subcutaneous Q Mon-1800   heparin  5,000 Units Subcutaneous  Q8H   hydrALAZINE  100 mg Oral TID   insulin aspart  0-6 Units Subcutaneous TID WC   labetalol  600 mg Oral BID   metolazone  5 mg Oral Daily   pravastatin  40 mg Oral Daily   Continuous Infusions:  ferric gluconate (FERRLECIT) IVPB 135 mL/hr at 07/19/21 1208   furosemide Stopped (07/19/21 1129)     LOS: 4 days   Time spent: 35 minutes.  Patrecia Pour, MD [facility] [email or web address] 07/19/2021, 1:21 PM

## 2021-07-19 NOTE — Progress Notes (Signed)
Heart Failure Navigator Progress Note  Assessed for Heart & Vascular TOC clinic readiness.  Patient does not meet criteria due to AHF rounding team consulted this hospitalization.   Navigator available for reassessment of patient.   Srihaan Mastrangelo, MSN, RN Heart Failure Nurse Navigator [phone]   

## 2021-07-19 NOTE — Progress Notes (Signed)
Mobility Specialist Progress Note    07/19/21 1719  Mobility  Activity Refused mobility   Checked with pt x2 and refused both times.    [facility] Mobility Specialist  M.S. Primary Phone: [phone] M.S. Secondary Phone: [phone]

## 2021-07-20 ENCOUNTER — Inpatient Hospital Stay (HOSPITAL_COMMUNITY): Payer: 59

## 2021-07-20 DIAGNOSIS — I5033 Acute on chronic diastolic (congestive) heart failure: Secondary | ICD-10-CM | POA: Diagnosis not present

## 2021-07-20 DIAGNOSIS — I509 Heart failure, unspecified: Secondary | ICD-10-CM | POA: Diagnosis not present

## 2021-07-20 DIAGNOSIS — N179 Acute kidney failure, unspecified: Secondary | ICD-10-CM | POA: Diagnosis not present

## 2021-07-20 DIAGNOSIS — N184 Chronic kidney disease, stage 4 (severe): Secondary | ICD-10-CM | POA: Diagnosis not present

## 2021-07-20 LAB — BASIC METABOLIC PANEL
Anion gap: 11 (ref 5–15)
BUN: 87 mg/dL — ABNORMAL HIGH (ref 6–20)
CO2: 24 mmol/L (ref 22–32)
Calcium: 8.3 mg/dL — ABNORMAL LOW (ref 8.9–10.3)
Chloride: 102 mmol/L (ref 98–111)
Creatinine, Ser: 5.81 mg/dL — ABNORMAL HIGH (ref 0.61–1.24)
GFR, Estimated: 11 mL/min — ABNORMAL LOW (ref >60)
Glucose, Bld: 136 mg/dL — ABNORMAL HIGH (ref 70–99)
Potassium: 4.9 mmol/L (ref 3.5–5.1)
Sodium: 137 mmol/L (ref 135–145)

## 2021-07-20 LAB — GLUCOSE, CAPILLARY
Glucose-Capillary: 156 mg/dL — ABNORMAL HIGH (ref 70–99)
Glucose-Capillary: 118 mg/dL — ABNORMAL HIGH (ref 70–99)
Glucose-Capillary: 140 mg/dL — ABNORMAL HIGH (ref 70–99)
Glucose-Capillary: 175 mg/dL — ABNORMAL HIGH (ref 70–99)

## 2021-07-20 MED ORDER — TECHNETIUM TC 99M PYROPHOSPHATE
20.7000 | Freq: Once | INTRAVENOUS | Status: AC | PRN
  Administered 2021-07-20: 10:00:00 20.7 via INTRAVENOUS
  Filled 2021-07-20: qty 21

## 2021-07-20 NOTE — Evaluation (Signed)
Occupational Therapy Evaluation Patient Details Name: [name] MRN: [id number] DOB: 10-29-1965 Today's Date: 07/20/2021   History of Present Illness Pt is a 55 y.o. M who presents 07/15/2021 with volume overload. Significant PMH: chronic HFpEF, stage IV CKD, T2DM, HTN, anemia.   Clinical Impression   Eli[place]oro was generally indep PTA. He lives in a 2 level home, 9 STE with his wife who works during the day. Upon evaluation pt was limited by poor activity tolerance. Overall he required min guard for functional ambulation without AD, and min A for LB ADLs. Pt will benefit from OT acutely to address limitations listed below. Recommend d/c to home with supervision initially for ADLs and mobility.      Recommendations for follow up therapy are one component of a multi-disciplinary discharge planning process, led by the attending physician.  Recommendations may be updated based on patient status, additional functional criteria and insurance authorization.   Follow Up Recommendations  No OT follow up    Assistance Recommended at Discharge Intermittent Supervision/Assistance  Functional Status Assessment  Patient has had a recent decline in their functional status and demonstrates the ability to make significant improvements in function in a reasonable and predictable amount of time.  Equipment Recommendations  Tub/shower bench       Precautions / Restrictions Precautions Precautions: Fall Restrictions Weight Bearing Restrictions: No      Mobility Bed Mobility Overal bed mobility: Modified Independent                  Transfers Overall transfer level: Modified independent         Balance Overall balance assessment: Needs assistance Sitting-balance support: Feet supported Sitting balance-Leahy Scale: Good     Standing balance support: No upper extremity supported;During functional activity Standing balance-Leahy Scale: Fair           ADL either performed or  assessed with clinical judgement   ADL Overall ADL's : Needs assistance/impaired Eating/Feeding: Independent;Sitting   Grooming: Supervision/safety;Standing   Upper Body Bathing: Set up;Sitting   Lower Body Bathing: Sit to/from stand;Minimal assistance Lower Body Bathing Details (indicate cue type and reason): A for distal BLE Upper Body Dressing : Set up;Sitting   Lower Body Dressing: Minimal assistance   Toilet Transfer: Min guard;Ambulation   Toileting- Clothing Manipulation and Hygiene: Supervision/safety;Sitting/lateral lean       Functional mobility during ADLs: Min guard General ADL Comments: poor activity tolerance, assist for distal BLE     Vision Baseline Vision/History: 1 Wears glasses Ability to See in Adequate Light: 0 Adequate Patient Visual Report: No change from baseline Vision Assessment?: No apparent visual deficits     Perception     Praxis      Pertinent Vitals/Pain Pain Assessment: Faces Faces Pain Scale: No hurt Pain Intervention(s): Monitored during session        Extremity/Trunk Assessment Upper Extremity Assessment Upper Extremity Assessment: Overall WFL for tasks assessed   Lower Extremity Assessment Lower Extremity Assessment: Defer to PT evaluation   Cervical / Trunk Assessment Cervical / Trunk Assessment: Normal   Communication Communication Communication: No difficulties   Cognition Arousal/Alertness: Awake/alert Behavior During Therapy: WFL for tasks assessed/performed Overall Cognitive Status: Within Functional Limits for tasks assessed           General Comments  VSS on RA     Home Living Family/patient expects to be discharged to:: Private residence Living Arrangements: Spouse/significant other Available Help at Discharge: Family Type of Home: House Home Access: Stairs to enter  Entrance Stairs-Number of Steps: 9   Home Layout: Two level;Able to live on main level with bedroom/bathroom     Bathroom  Shower/Tub: Tub/shower unit   Bathroom Toilet: Handicapped height     Home Equipment: Air cabin crew (4 wheels)          Prior Functioning/Environment Prior Level of Function : Independent/Modified Independent;Driving             OT Problem List: Decreased range of motion;Decreased activity tolerance;Impaired balance (sitting and/or standing);Decreased safety awareness;Decreased knowledge of use of DME or AE      OT Treatment/Interventions: Self-care/ADL training;Therapeutic exercise;Balance training;Patient/family education;Therapeutic activities    OT Goals(Current goals can be found in the care plan section) Acute Rehab OT Goals Patient Stated Goal: "get my breathing back" OT Goal Formulation: With patient Time For Goal Achievement: 08/03/21 Potential to Achieve Goals: Good ADL Goals Pt Will Perform Lower Body Bathing: with set-up;sit to/from stand Pt Will Perform Lower Body Dressing: with set-up;sit to/from stand Additional ADL Goal #1: pt will tolerate at least 8 minutes of OOB functional activity at a mod I level Additional ADL Goal #2: Pt will indep verbalize at least 3 energy conservation stategies  OT Frequency: Min 2X/week   Barriers to D/C: Inaccessible home environment  several STE          AM-PAC OT "6 Clicks" Daily Activity     Outcome Measure Help from another person eating meals?: None Help from another person taking care of personal grooming?: A Little Help from another person toileting, which includes using toliet, bedpan, or urinal?: A Little Help from another person bathing (including washing, rinsing, drying)?: A Little Help from another person to put on and taking off regular upper body clothing?: None Help from another person to put on and taking off regular lower body clothing?: A Little 6 Click Score: 20   End of Session Nurse Communication: Mobility status  Activity Tolerance: Patient tolerated treatment well Patient left: in  bed;with call bell/phone within reach  OT Visit Diagnosis: Unsteadiness on feet (R26.81);Muscle weakness (generalized) (M62.81)                Time: 1357-1410 OT Time Calculation (min): 13 min Charges:  OT General Charges $OT Visit: 1 Visit OT Evaluation $OT Eval Low Complexity: 1 Low   Arles Rumbold A Elfego Giammarino 07/20/2021, 2:15 PM

## 2021-07-20 NOTE — Progress Notes (Signed)
OT Cancellation Note  Patient Details Name: [name] MRN: [id number] DOB: 1965/12/15   Cancelled Treatment:    Reason Eval/Treat Not Completed: Patient at procedure or test/ unavailable (OT evaluation to f/u today as appropriate.)  Kaiyu Mirabal A Noretta Frier 07/20/2021, 11:23 AM

## 2021-07-20 NOTE — Plan of Care (Signed)
°  Problem: Education: Goal: Knowledge of General Education information will improve Description: Including pain rating scale, medication(s)/side effects and non-pharmacologic comfort measures Outcome: Progressing   Problem: Health Behavior/Discharge Planning: Goal: Ability to manage health-related needs will improve Outcome: Progressing   Problem: Clinical Measurements: Goal: Ability to maintain clinical measurements within normal limits will improve Outcome: Progressing Goal: Will remain free from infection Outcome: Progressing Goal: Diagnostic test results will improve Outcome: Progressing Goal: Respiratory complications will improve Outcome: Progressing Goal: Cardiovascular complication will be avoided Outcome: Progressing   Problem: Activity: Goal: Risk for activity intolerance will decrease Outcome: Progressing   Problem: Pain Managment: Goal: General experience of comfort will improve Outcome: Progressing

## 2021-07-20 NOTE — Progress Notes (Signed)
Mobility Specialist: Progress Note   07/20/21 1708  Mobility  Activity Ambulated in hall  Level of Assistance Contact guard assist, steadying assist  Assistive Device None  Distance Ambulated (ft) 340 ft (170'x2)  Mobility Ambulated with assistance in hallway  Mobility Response Tolerated well  Mobility performed by Mobility specialist  $Mobility charge 1 Mobility   Pre-Mobility: 84 HR During Mobility: 87 HR, 93% SpO2 Post-Mobility: 90 HR  Pt independent to sit EOB as well as to stand. Pt contact guard during ambulation d/t some unsteadiness throughout. Pt stopped x1 for short standing break d/t feeling SOB, sats as seen above. Pt sitting EOB per request after walk with call bell and phone in reach.   [facility] [name] Mobility Specialist Mobility Specialist 4 [place]: [phone] Mobility Specialist 2 [place] and [place]: [phone]

## 2021-07-20 NOTE — Progress Notes (Signed)
[facility] PROGRESS NOTE    Progress Note  [name]  YTK:[id number] DOB: 1966-04-12 DOA: 07/15/2021 PCP: [name], MD     Brief Narrative:   [name] is an 55 y.o. male history of chronic diastolic heart failure, chronic kidney disease stage IV, referred to the ED on 07/16/2019 to from the cardiology clinic due to volume overload.  Chest x-ray confirmed pulmonary congestion with bilateral pleural effusion BNP of 600 creatinine at baseline of 4 was started on IV Lasix and metolazone nephrology was consulted    Assessment/Plan:   Acute on chronic diastolic CHF (congestive heart failure) ([place]) Advanced heart failure team was consulted they recommended to continue IV Lasix and metolazone.  His weight was 135 kg, this morning is 127 To have brisk urine output he is negative about 11 L. Creatinine continues to rise.  No uremic symptoms. Further management per advanced heart failure team and nephrology. Cardiology concerned about amyloid involvement PYP scan to be scheduled, myeloma panel is pending  Acute respiratory failure with hypoxia due to pulmonary edema and heart failure: Has been weaned from oxygen therapy.  Chronic kidney disease stage IV: Follows with Dr. Theador Hawthorne as an outpatient. Nephrology was consulted recommended vein mapping and the patient is unsure whether to proceed with dialysis.  Will defer conversation to renal. No uremic symptoms  Chronic lower extremity wound: No evidence of infection, continue Unna boot therapy.  Essential hypertension: Continue labetalol, hydralazine holding ACE inhibitor as he is no longer a candidate.  Hyperkalemia intermittent: Renal diet has been changed and no longer a candidate for ACE inhibitor or ARB, Entresto or Aldactone. Metolazone and Lasix helping.  Diabetes mellitus type 2: With an A1c of 7.1, continue sliding scale insulin goes well controlled.  Chronic renal disease: No signs of bleeding  hemoglobin at 9.  Morbid obesity: He has been counseled.   DVT prophylaxis: lovenox Family Communication:none Status is: Inpatient  Remains inpatient appropriate because: Acute diastolic heart failure        Code Status:     Code Status Orders  (From admission, onward)           Start     Ordered   07/15/21 2205  Full code  Continuous        07/15/21 2205           Code Status History     Date Active Date Inactive Code Status Order ID Comments User Context   04/16/2021 1733 04/23/2021 1447 Full Code [id number]  Orson Eva, MD ED   09/27/2020 2033 10/01/2020 1804 Full Code [id number]  Bethena Roys, MD Inpatient   06/14/2020 2046 06/21/2020 1833 Full Code [id number]  Zierle-Ghosh, [place] B, DO Inpatient   05/03/2020 1805 05/07/2020 1900 Full Code [id number]  Roney Jaffe, MD ED   04/05/2020 1852 04/10/2020 2115 Full Code [id number]  Oswald Hillock, MD ED   07/21/2011 1126 07/24/2011 1439 Full Code [id number]  Pollie Meyer, LPN Inpatient         IV Access:   Peripheral IV   Procedures and diagnostic studies:   No results found.   Medical Consultants:   None.   Subjective:    [name] relates his breathing is better denies any nausea or vomiting.  Objective:    Vitals:   07/19/21 2129 07/20/21 0500 07/20/21 0637 07/20/21 0924  BP: (!) 142/78  (!) 160/89 138/72  Pulse: 88  86 82  Resp: 18  18   Temp: 98.1  F (36.7 C)  98.2 F (36.8 C)   TempSrc: Oral  Oral   SpO2: 98%  99%   Weight:  127 kg    Height:       SpO2: 99 %   Intake/Output Summary (Last 24 hours) at 07/20/2021 1151 Last data filed at 07/20/2021 0750 Gross per 24 hour  Intake 1016.02 ml  Output 3950 ml  Net -2933.98 ml   Filed Weights   07/18/21 0500 07/19/21 0544 07/20/21 0500  Weight: 130 kg 128.8 kg 127 kg    Exam: General exam: In no acute distress. Respiratory system: Good air movement and clear to auscultation. Cardiovascular system: S1 & S2  heard, RRR. No JVD. Gastrointestinal system: Abdomen is nondistended, soft and nontender.  Extremities: 3+ edema Skin: No rashes, lesions or ulcers Psychiatry: Judgement and insight appear normal. Mood & affect appropriate.    Data Reviewed:    Labs: Basic Metabolic Panel: Recent Labs  Lab 07/16/21 0029 07/17/21 0258 07/18/21 0759 07/19/21 0323 07/20/21 0427  NA 141 139 140 139 137  K 4.9 5.2* 4.9 5.2* 4.9  CL 112* 110 106 106 102  CO2 22 21* 23 22 24   GLUCOSE 121* 96 128* 113* 136*  BUN 64* 68* 73* 80* 87*  CREATININE 4.30*   4.31* 4.91* 5.25* 5.51* 5.81*  CALCIUM 8.2* 8.0* 8.3* 8.5* 8.3*  MG 1.9  --   --   --   --    GFR Estimated Creatinine Clearance: 19.3 mL/min (A) (by C-G formula based on SCr of 5.81 mg/dL (H)). Liver Function Tests: Recent Labs  Lab 07/15/21 1130  AST 17  ALT 19  ALKPHOS 78  BILITOT 0.5  PROT 6.0*  ALBUMIN 2.8*   No results for input(s): LIPASE, AMYLASE in the last 168 hours. No results for input(s): AMMONIA in the last 168 hours. Coagulation profile No results for input(s): INR, PROTIME in the last 168 hours. COVID-19 Labs  Recent Labs    07/19/21 0323  FERRITIN 99    Lab Results  Component Value Date   SARSCOV2NAA NEGATIVE 07/15/2021   [place] NEGATIVE 04/16/2021   [place] NEGATIVE 09/27/2020   [place] NEGATIVE 06/14/2020    CBC: Recent Labs  Lab 07/15/21 1130 07/15/21 1259 07/16/21 0220 07/17/21 0258  WBC 4.9 5.2 5.7 4.9  HGB 9.4* 9.5* 9.1* 8.7*  HCT 29.9* 29.9* 27.7* 27.6*  MCV 84.9 85.9 84.7 84.9  PLT 261 277 255 270   Cardiac Enzymes: No results for input(s): CKTOTAL, CKMB, CKMBINDEX, TROPONINI in the last 168 hours. BNP (last 3 results) No results for input(s): PROBNP in the last 8760 hours. CBG: Recent Labs  Lab 07/19/21 1139 07/19/21 1613 07/19/21 2130 07/20/21 0746 07/20/21 1135  GLUCAP 132* 150* 157* 156* 118*   D-Dimer: No results for input(s): DDIMER in the last 72 hours. Hgb  A1c: No results for input(s): HGBA1C in the last 72 hours. Lipid Profile: No results for input(s): CHOL, HDL, LDLCALC, TRIG, CHOLHDL, LDLDIRECT in the last 72 hours. Thyroid function studies: No results for input(s): TSH, T4TOTAL, T3FREE, THYROIDAB in the last 72 hours.  Invalid input(s): FREET3 Anemia work up: Recent Labs    07/19/21 0323  FERRITIN 99  TIBC 293  IRON 79   Sepsis Labs: Recent Labs  Lab 07/15/21 1130 07/15/21 1259 07/16/21 0220 07/17/21 0258  WBC 4.9 5.2 5.7 4.9  LATICACIDVEN 1.4  --   --   --    Microbiology Recent Results (from the past 240 hour(s))  Resp Panel  by RT-PCR (Flu A&B, Covid) Nasopharyngeal Swab     Status: None   Collection Time: 07/15/21  1:00 PM   Specimen: Nasopharyngeal Swab; Nasopharyngeal(NP) swabs in vial transport medium  Result Value Ref Range Status   SARS Coronavirus 2 by RT PCR NEGATIVE NEGATIVE Final    Comment: (NOTE) SARS-CoV-2 target nucleic acids are NOT DETECTED.  The SARS-CoV-2 RNA is generally detectable in upper respiratory specimens during the acute phase of infection. The lowest concentration of SARS-CoV-2 viral copies this assay can detect is 138 copies/mL. A negative result does not preclude SARS-Cov-2 infection and should not be used as the sole basis for treatment or other patient management decisions. A negative result may occur with  improper specimen collection/handling, submission of specimen other than nasopharyngeal swab, presence of viral mutation(s) within the areas targeted by this assay, and inadequate number of viral copies(<138 copies/mL). A negative result must be combined with clinical observations, patient history, and epidemiological information. The expected result is Negative.  Fact Sheet for Patients:  EntrepreneurPulse.com.au  Fact Sheet for Healthcare Providers:  IncredibleEmployment.be  This test is no t yet approved or cleared by the [place]  FDA and  has been authorized for detection and/or diagnosis of SARS-CoV-2 by FDA under an Emergency Use Authorization (EUA). This EUA will remain  in effect (meaning this test can be used) for the duration of the COVID-19 declaration under Section 564(b)(1) of the Act, 21 U.S.C.section 360bbb-3(b)(1), unless the authorization is terminated  or revoked sooner.       Influenza A by PCR NEGATIVE NEGATIVE Final   Influenza B by PCR NEGATIVE NEGATIVE Final    Comment: (NOTE) The Xpert Xpress SARS-CoV-2/FLU/RSV plus assay is intended as an aid in the diagnosis of influenza from Nasopharyngeal swab specimens and should not be used as a sole basis for treatment. Nasal washings and aspirates are unacceptable for Xpert Xpress SARS-CoV-2/FLU/RSV testing.  Fact Sheet for Patients: EntrepreneurPulse.com.au  Fact Sheet for Healthcare Providers: IncredibleEmployment.be  This test is not yet approved or cleared by the [place] FDA and has been authorized for detection and/or diagnosis of SARS-CoV-2 by FDA under an Emergency Use Authorization (EUA). This EUA will remain in effect (meaning this test can be used) for the duration of the COVID-19 declaration under Section 564(b)(1) of the Act, 21 U.S.C. section 360bbb-3(b)(1), unless the authorization is terminated or revoked.  Performed at [facility], [place] [address], [place], [place] [address]      Medications:    darbepoetin (ARANESP) injection - NON-DIALYSIS  100 mcg Subcutaneous Q Mon-1800   heparin  5,000 Units Subcutaneous Q8H   hydrALAZINE  100 mg Oral TID   insulin aspart  0-6 Units Subcutaneous TID WC   labetalol  600 mg Oral BID   metolazone  5 mg Oral Daily   pravastatin  40 mg Oral Daily   Continuous Infusions:  ferric gluconate (FERRLECIT) IVPB 250 mg (07/20/21 0935)   furosemide 160 mg (07/20/21 1144)      LOS: 5 days   Charlynne Cousins  [facility]  07/20/2021, 11:51 AM

## 2021-07-20 NOTE — Progress Notes (Signed)
Pt notified nurse at 1830 that he would like to go ahead and start the dialysis process, Dr. Aileen Fass paged and nephrology on call paged.   Rowland Lathe, RN 12.28.22 [phone]

## 2021-07-20 NOTE — Progress Notes (Addendum)
Advanced Heart Failure Rounding Note  PCP-Cardiologist: Carlyle Dolly, MD   Subjective:    SCr still climbing, 4.91>>5.25>>5.51>>5.81. Diuresis sluggish but nonoliguric. 3.1L in UOP yesterday w/ high dose IV Lasix + metolazone.   Wt down an additional 5 lb.   K 4.9  BP moderately elevated.  Refused CPAP last night.   No resting dyspnea but still SOB w/ exertion.   Objective:   Weight Range: 127 kg Body mass index is 39.04 kg/m.   Vital Signs:   Temp:  [97.7 F (36.5 C)-98.2 F (36.8 C)] 98.2 F (36.8 C) (12/28 0637) Pulse Rate:  [76-88] 86 (12/28 0637) Resp:  [17-18] 18 (12/28 0637) BP: (134-160)/(69-89) 160/89 (12/28 0637) SpO2:  [98 %-99 %] 99 % (12/28 0637) Weight:  [568 kg] 127 kg (12/28 0500) Last BM Date: 07/16/21  Weight change: Filed Weights   07/18/21 0500 07/19/21 0544 07/20/21 0500  Weight: 130 kg 128.8 kg 127 kg    Intake/Output:   Intake/Output Summary (Last 24 hours) at 07/20/2021 1275 Last data filed at 07/20/2021 0230 Gross per 24 hour  Intake 1202.02 ml  Output 3100 ml  Net -1897.98 ml      Physical Exam    General:  Well appearing. No respiratory difficulty HEENT: normal Neck: supple. JVD to ear. Carotids 2+ bilat; no bruits. No lymphadenopathy or thyromegaly appreciated. Cor: PMI nondisplaced. Regular rate & rhythm. No rubs, gallops or murmurs. Lungs: clear Abdomen: soft, nontender, nondistended. No hepatosplenomegaly. No bruits or masses. Good bowel sounds. Extremities: no cyanosis, clubbing, rash, 2+ bilateral LE edema + unna boots  Neuro: alert & oriented x 3, cranial nerves grossly intact. moves all 4 extremities w/o difficulty. Affect pleasant.    Telemetry   NSR 90s   EKG    No new EKG to review   Labs    CBC No results for input(s): WBC, NEUTROABS, HGB, HCT, MCV, PLT in the last 72 hours.  Basic Metabolic Panel Recent Labs    07/19/21 0323 07/20/21 0427  NA 139 137  K 5.2* 4.9  CL 106 102  CO2 22  24  GLUCOSE 113* 136*  BUN 80* 87*  CREATININE 5.51* 5.81*  CALCIUM 8.5* 8.3*   Liver Function Tests No results for input(s): AST, ALT, ALKPHOS, BILITOT, PROT, ALBUMIN in the last 72 hours. No results for input(s): LIPASE, AMYLASE in the last 72 hours. Cardiac Enzymes No results for input(s): CKTOTAL, CKMB, CKMBINDEX, TROPONINI in the last 72 hours.  BNP: BNP (last 3 results) Recent Labs    06/24/21 1250 07/15/21 1130 07/15/21 1259  BNP 615.1* 638.5* 620.0*    ProBNP (last 3 results) No results for input(s): PROBNP in the last 8760 hours.   D-Dimer No results for input(s): DDIMER in the last 72 hours. Hemoglobin A1C No results for input(s): HGBA1C in the last 72 hours. Fasting Lipid Panel No results for input(s): CHOL, HDL, LDLCALC, TRIG, CHOLHDL, LDLDIRECT in the last 72 hours. Thyroid Function Tests No results for input(s): TSH, T4TOTAL, T3FREE, THYROIDAB in the last 72 hours.  Invalid input(s): FREET3  Other results:   Imaging    No results found.   Medications:     Scheduled Medications:  darbepoetin (ARANESP) injection - NON-DIALYSIS  100 mcg Subcutaneous Q Mon-1800   heparin  5,000 Units Subcutaneous Q8H   hydrALAZINE  100 mg Oral TID   insulin aspart  0-6 Units Subcutaneous TID WC   labetalol  600 mg Oral BID   metolazone  5 mg  Oral Daily   pravastatin  40 mg Oral Daily    Infusions:  ferric gluconate (FERRLECIT) IVPB Stopped (07/19/21 1407)   furosemide 160 mg (07/20/21 0300)    PRN Medications: acetaminophen **OR** acetaminophen     Assessment/Plan    1. Acute on Chronic diastolic CHF: Most recent echo in 9/22 showed EF 56-60%, moderate LVH, grade 2 diastolic dysfunction, normal RV size and systolic function.  Multiple admissions for CHF.  Cardiac amyloidosis (most likely transthyretin) is a concern, myeloma panel negative. Now admitted w/ a/c CHF w/ marked fluid overload with NYHA class IIIb-IV symptoms, c/b worsening Stage IV CKD.  SCr now up to 5.5 (previous baseline ~4.1). He is diuresing w/ high dose lasix, but slowly. Remains markedly fluid overloaded. Nephrology following.  - Continue IV Lasix 160 mg q8H per nephrology + metolazone 5 mg daily.   - May be nearing need for initiation of HD - continue UNNA boots. - Cannot use Jardiance or ARB/ARNi with elevated creatinine.  - Now that he has insurance, PYP scan arranged to assess for TTR cardiac amyloidosis. Will complete while inpatient    2. AKI on CKD stage 4: Pre admit baseline Scr ~ 4.18. Now trending up, 4.91>>5.25>>5.51>>5.81 - He followed with Dr. Theador Hawthorne outpatient. - inpatient nephrology team now following and helping to dose diuretics  - nonoliguric, but may be nearing HD  - nephrology planing vein mapping for HD access    3. OSA: CPAP ordered, pt refusing.    4. HTN: moderately elevated today  - continue current regimen, would avoid agressive lowering to help w/ renal perfusion - Continue labetalol 600 mg bid for now. - Continue hydralazine 100 mg tid.  - Off lisinopril with hyperkalemia   5. DM2 - cover with SSI   6. Hyperkalemia - resolved, K 5.2>>4.9 today  - continue w/ IV Lasix per nephrology  - Lokelma PRN    Length of Stay: [address] PA-C  07/20/2021, 8:22 AM  Advanced Heart Failure Team Pager [phone] (M-F; 7a - 5p)  Please contact [facility] for night-coverage after hours (5p -7a ) and weekends on [email or web address]  Patient seen with PA, agree with the above note.   Weight down 4 lbs, good diuresis yesterday.  However, creatinine higher at 5.81.  Legs feel less swollen.   General: NAD Neck: JVP difficult but appears around 10 cm, no thyromegaly or thyroid nodule.  Lungs: Clear to auscultation bilaterally with normal respiratory effort. CV: Nondisplaced PMI.  Heart regular S1/S2, no S3/S4, no murmur.  2+ edema to knees.  Abdomen: Soft, nontender, no hepatosplenomegaly, no distention.  Skin: Intact without lesions or  rashes.  Neurologic: Alert and oriented x 3.  Psych: Normal affect. Extremities: No clubbing or cyanosis.  HEENT: Normal.   Still volume overloaded but improving, diuresis impeded by AKI on CKD stage IV.   - Continue current IV Lasix and metolazone per nephrology.  - I am concerned that he is reaching the point where he will need dialysis for volume management.  - PYP scan done today to assess for ATTR cardiac amyloidosis.   Loralie Champagne 07/20/2021 1:50 PM

## 2021-07-20 NOTE — Progress Notes (Signed)
[facility] Progress Note  Name: [name] MRN: [id number] DOB: [birth year]   Subjective:  He had another at least 3.1 liters UOP .   His legs feel much less tight, weight trending down - crt is climbing-  now 5.8-  bun 87-  last albumin 2.8-  no uremic sxms  --------------- Background on consult:  [name] is a 55 y.o. 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  Note that on 06/24/2021 his metolazone was increased to 5 mg 5 days a week given persistent overload at that time per charting.  When he was seen in heart failure follow-up clinic on 12/23 his weight was noted to be up more than 20 pounds and he was directed to the hospital for IV diuretics.  Dr. Haroldine Laws recommended Lasix 160 mg IV every 6 hours plus metolazone 5 mg daily with the knowledge that he may need to start dialysis if he does not respond.  Cardiology is also planning to rule out amyloid as well.  Creatinine trends as below.  700 and mils of urine recorded on 12/23 and 1.3 L as well as one unmeasured urine void reported today.  He follows with Dr. Theador Hawthorne.  Per nephrology notes his metolazone was increased to 5 mg three times a week at the 06/08/21 office appointment.  He does think that his thighs don't feel tight since yesterday.  He would want dialysis if it were needed - he thought was going to have to start when GFR was 12 but it got better.  He does not have access. Short of breath after ambulating to restroom - just had diarrhea.    Intake/Output Summary (Last 24 hours) at 07/20/2021 1149 Last data filed at 07/20/2021 0750 Gross per 24 hour  Intake 1016.02 ml  Output 3950 ml  Net -2933.98 ml    Vitals:  Vitals:   07/19/21 2129 07/20/21 0500 07/20/21 0637 07/20/21 0924  BP: (!) 142/78  (!) 160/89 138/72  Pulse: 88  86 82  Resp:  18  18   Temp: 98.1 F (36.7 C)  98.2 F (36.8 C)   TempSrc: Oral  Oral   SpO2: 98%  99%   Weight:  127 kg    Height:         Physical Exam:   General: adult male in bed in NAD at rest HEENT: NCAT Eyes: EOMI sclera anicteric Neck: Supple trachea midline Heart: S1S2 no rub Lungs: basilar crackles; unlabored at rest on room air  Abdomen: softly distended /nt [name] habitus Extremities: 3+ pitting edema bilateral lower extremities Skin: chronic venous stasis changes Neuro: alert and oriented x 3 provides hx and follows commands Pysch normal mood and affect GU no foley  Medications reviewed   Labs:  BMP Latest Ref Rng & Units 07/20/2021 07/19/2021 07/18/2021  Glucose 70 - 99 mg/dL 136(H) 113(H) 128(H)  BUN 6 - 20 mg/dL 87(H) 80(H) 73(H)  Creatinine 0.61 - 1.24 mg/dL 5.81(H) 5.51(H) 5.25(H)  BUN/Creat Ratio 9 - 20 - - -  Sodium 135 - 145 mmol/L 137 139 140  Potassium 3.5 - 5.1 mmol/L 4.9 5.2(H) 4.9  Chloride 98 - 111 mmol/L 102 106 106  CO2 22 - 32 mmol/L 24 22 23   Calcium 8.9 - 10.3 mg/dL 8.3(L) 8.5(L) 8.3(L)     Assessment/Plan:   # Acute on  chronic diastolic CHF  - Continue lasix 160 mg IV every 8 hours and metolazone daily - note weight 139.7 kg on 12/23 for reference-  is down to 127 but still has a fair bit of edema    # CKD stage IV  - Cr was 3.74 with GFR 18 in November 2022 and in September GFR below 15 - If fluid is not able to be managed with diuretics would need initiation of hemodialysis-  so far has diuresed - holding home lisinopril and amlodipine - changed to renal DM diet with fluid restriction 1.2 liters/day - He follows with an outside practice, Dr. Theador [facility]thorne [facility] -  needs access-  I talked to patient about this-  he is hesitant-  will at least do vein mapping and keep asking him if OK to get vascular consult-  feel that diuresis is just un masking his severe CKD-  he might not need it just now but is getting very close  and anticipate volume will continue to be difficult to control -  albumin is below 3 which is telling as well.  Asks about kidney transplant-   says has known people who have died after starting dialysis- attempted to talk thru these issues -   still not willing to talk with vascular about access placement     # HTN  - optimize volume status with diuretics as above-   is still needing continued IV diuretics  - stop amlodipine for now and hope to be able to hold if clouds volume assessment    # OSA - note decreased respiratory reserve   # DM type 2 - per primary team   - Anemia-  iron stores just a little low, will replete and have added ESA -  will check hgb in AM  Hyperkalemia-  suspect will dec with high dose lasix he is on -  will follow      Louis Meckel, MD 07/20/2021 11:49 AM

## 2021-07-21 ENCOUNTER — Inpatient Hospital Stay (HOSPITAL_COMMUNITY): Payer: 59

## 2021-07-21 DIAGNOSIS — N186 End stage renal disease: Secondary | ICD-10-CM

## 2021-07-21 DIAGNOSIS — Z992 Dependence on renal dialysis: Secondary | ICD-10-CM

## 2021-07-21 DIAGNOSIS — Z818 Family history of other mental and behavioral disorders: Secondary | ICD-10-CM

## 2021-07-21 DIAGNOSIS — I132 Hypertensive heart and chronic kidney disease with heart failure and with stage 5 chronic kidney disease, or end stage renal disease: Secondary | ICD-10-CM

## 2021-07-21 DIAGNOSIS — E1122 Type 2 diabetes mellitus with diabetic chronic kidney disease: Secondary | ICD-10-CM

## 2021-07-21 DIAGNOSIS — I509 Heart failure, unspecified: Secondary | ICD-10-CM

## 2021-07-21 DIAGNOSIS — I5033 Acute on chronic diastolic (congestive) heart failure: Secondary | ICD-10-CM | POA: Diagnosis not present

## 2021-07-21 DIAGNOSIS — Z833 Family history of diabetes mellitus: Secondary | ICD-10-CM

## 2021-07-21 LAB — SURGICAL PCR SCREEN
MRSA, PCR: NEGATIVE
Staphylococcus aureus: NEGATIVE

## 2021-07-21 LAB — CBC
HCT: 29.2 % — ABNORMAL LOW (ref 39.0–52.0)
Hemoglobin: 9.4 g/dL — ABNORMAL LOW (ref 13.0–17.0)
MCH: 26.8 pg (ref 26.0–34.0)
MCHC: 32.2 g/dL (ref 30.0–36.0)
MCV: 83.2 fL (ref 80.0–100.0)
Platelets: 315 10*3/uL (ref 150–400)
RBC: 3.51 MIL/uL — ABNORMAL LOW (ref 4.22–5.81)
RDW: 14.3 % (ref 11.5–15.5)
WBC: 6.2 10*3/uL (ref 4.0–10.5)
nRBC: 0 % (ref 0.0–0.2)

## 2021-07-21 LAB — RENAL FUNCTION PANEL
Albumin: 2.8 g/dL — ABNORMAL LOW (ref 3.5–5.0)
Anion gap: 11 (ref 5–15)
BUN: 94 mg/dL — ABNORMAL HIGH (ref 6–20)
CO2: 25 mmol/L (ref 22–32)
Calcium: 8.6 mg/dL — ABNORMAL LOW (ref 8.9–10.3)
Chloride: 101 mmol/L (ref 98–111)
Creatinine, Ser: 5.9 mg/dL — ABNORMAL HIGH (ref 0.61–1.24)
GFR, Estimated: 10 mL/min — ABNORMAL LOW (ref >60)
Glucose, Bld: 128 mg/dL — ABNORMAL HIGH (ref 70–99)
Phosphorus: 5.4 mg/dL — ABNORMAL HIGH (ref 2.5–4.6)
Potassium: 4.6 mmol/L (ref 3.5–5.1)
Sodium: 137 mmol/L (ref 135–145)

## 2021-07-21 LAB — GLUCOSE, CAPILLARY
Glucose-Capillary: 169 mg/dL — ABNORMAL HIGH (ref 70–99)
Glucose-Capillary: 136 mg/dL — ABNORMAL HIGH (ref 70–99)
Glucose-Capillary: 160 mg/dL — ABNORMAL HIGH (ref 70–99)
Glucose-Capillary: 125 mg/dL — ABNORMAL HIGH (ref 70–99)

## 2021-07-21 MED ORDER — HYDRALAZINE HCL 50 MG PO TABS
100.0000 mg | ORAL_TABLET | Freq: Three times a day (TID) | ORAL | Status: DC
  Administered 2021-07-22 – 2021-08-01 (×29): 100 mg via ORAL
  Filled 2021-07-21 (×30): qty 2

## 2021-07-21 MED ORDER — HEPARIN SODIUM (PORCINE) 5000 UNIT/ML IJ SOLN
5000.0000 [IU] | Freq: Three times a day (TID) | INTRAMUSCULAR | Status: DC
  Administered 2021-07-22 – 2021-08-02 (×29): 5000 [IU] via SUBCUTANEOUS
  Filled 2021-07-21 (×29): qty 1

## 2021-07-21 MED ORDER — ONDANSETRON HCL 4 MG/2ML IJ SOLN: 4.0000 mg | Freq: Four times a day (QID) | INTRAMUSCULAR | Status: DC | PRN

## 2021-07-21 NOTE — Progress Notes (Signed)
Bilateral upper extremity vein mapping has been completed. Preliminary results can be found in CV Proc through chart review.   07/21/21 2:32 PM [name] RVT

## 2021-07-21 NOTE — Progress Notes (Signed)
[facility] PROGRESS NOTE    Progress Note  [name]  PJA:[id number] DOB: 09/30/1965 DOA: 07/15/2021 PCP: [name], MD     Brief Narrative:   [name] is an 55 y.o. male history of chronic diastolic heart failure, chronic kidney disease stage IV, referred to the ED on 07/16/2019 to from the cardiology clinic due to volume overload.  Chest x-ray confirmed pulmonary congestion with bilateral pleural effusion BNP of 600 creatinine at baseline of 4 was started on IV Lasix and metolazone nephrology was consulted    Assessment/Plan:   Acute on chronic diastolic CHF (congestive heart failure) ([place]) Advanced heart failure team was consulted they recommended to continue IV Lasix and metolazone.  His weight is 126.4 Kg To have good  urine output. Creatinine continues to rise.  This morning he is nauseated feeling tired question if he is becoming uremic. Further management per advanced heart failure team and nephrology. Cardiology concerned about amyloid involvement PYP scan to be scheduled, myeloma panel is pending  Acute respiratory failure with hypoxia due to pulmonary edema and heart failure: Has been weaned from oxygen therapy.  Chronic kidney disease stage IV: Follows with Dr. Theador Hawthorne as an outpatient. OrderedSeems to be getting uremic, nephrology on their will to have early access graft as an option if not AV/AV plus TDC would be acceptable. BUN is greater than 80.  Chronic lower extremity wound: No evidence of infection, continue Unna boot therapy.  Essential hypertension: Continue labetalol, hydralazine holding ACE inhibitor as he is no longer a candidate.  Hyperkalemia intermittent: Renal diet has been changed and no longer a candidate for ACE inhibitor or ARB, Entresto or Aldactone. Metolazone and Lasix helping.  Diabetes mellitus type 2: With an A1c of 7.1, continue sliding scale insulin goes well controlled.  Anemia of chronic renal  disease: No signs of bleeding hemoglobin at 9.  Morbid obesity: He has been counseled.   DVT prophylaxis: lovenox Family Communication:none Status is: Inpatient  Remains inpatient appropriate because: Acute diastolic heart failure        Code Status:     Code Status Orders  (From admission, onward)           Start     Ordered   07/15/21 2205  Full code  Continuous        07/15/21 2205           Code Status History     Date Active Date Inactive Code Status Order ID Comments User Context   04/16/2021 1733 04/23/2021 1447 Full Code [id number]  Orson Eva, MD ED   09/27/2020 2033 10/01/2020 1804 Full Code [id number]  Bethena Roys, MD Inpatient   06/14/2020 2046 06/21/2020 1833 Full Code [id number]  Zierle-Ghosh, [place] B, DO Inpatient   05/03/2020 1805 05/07/2020 1900 Full Code [id number]  Roney Jaffe, MD ED   04/05/2020 1852 04/10/2020 2115 Full Code [id number]  Oswald Hillock, MD ED   07/21/2011 1126 07/24/2011 1439 Full Code [id number]  Pollie Meyer, LPN Inpatient         IV Access:   Peripheral IV   Procedures and diagnostic studies:   NM CARDIAC AMYLOID TUMOR LOC INFLAM SPECT 1 DAY  Result Date: 07/20/2021 CLINICAL DATA:  HEART FAILURE. CONCERN FOR CARDIAC AMYLOIDOSIS. EXAM: NUCLEAR MEDICINE TUMOR LOCALIZATION. PYP CARDIAC AMYLOIDOSIS SCAN WITH SPECT TECHNIQUE: Following intravenous administration of radiopharmaceutical, anterior planar images of the chest were obtained. Regions of interest were placed on the heart and contralateral chest wall for  quantitative assessment. Additional SPECT imaging of the chest was obtained. RADIOPHARMACEUTICALS:  20.7 mCi TECHNETIUM 99 PYROPHOSPHATE FINDINGS: Planar Visual assessment: Anterior planar imaging demonstrates radiotracer uptake within the heart greater than uptake within the adjacent ribs (Grade 2). Quantitative assessment : Quantitative assessment of the cardiac uptake compared to the contralateral chest wall  is equal to (H/CL = 1.18). SPECT assessment: SPECT imaging of the chest demonstrates mild radiotracer accumulation within the LEFT ventricle. IMPRESSION: Visual and quantitative assessment (grade 2, H/CLL equal 1.18) are strongly suggestive of transthyretin amyloidosis. Electronically Signed   By: Kerby Moors M.D.   On: 07/20/2021 16:14     Medical Consultants:   None.   Subjective:    [name] started feeling nauseated and tired this morning.  Objective:    Vitals:   07/20/21 1954 07/21/21 0500 07/21/21 0530 07/21/21 0850  BP: (!) 162/77  (!) 146/75 (!) 162/82  Pulse:   85 88  Resp:   17   Temp: 97.9 F (36.6 C)  98.4 F (36.9 C)   TempSrc: Oral  Oral   SpO2:   98%   Weight:  126.4 kg    Height:       SpO2: 98 %   Intake/Output Summary (Last 24 hours) at 07/21/2021 1123 Last data filed at 07/21/2021 0610 Gross per 24 hour  Intake 270 ml  Output 1575 ml  Net -1305 ml    Filed Weights   07/19/21 0544 07/20/21 0500 07/21/21 0500  Weight: 128.8 kg 127 kg 126.4 kg    Exam: General exam: In no acute distress. Respiratory system: Good air movement and clear to auscultation. Cardiovascular system: S1 & S2 heard, RRR. No JVD. Gastrointestinal system: Abdomen is nondistended, soft and nontender.  Psychiatry: Judgement and insight appear normal. Mood & affect appropriate.   Data Reviewed:    Labs: Basic Metabolic Panel: Recent Labs  Lab 07/16/21 0029 07/17/21 0258 07/18/21 0759 07/19/21 0323 07/20/21 0427 07/21/21 0136  NA 141 139 140 139 137 137  K 4.9 5.2* 4.9 5.2* 4.9 4.6  CL 112* 110 106 106 102 101  CO2 22 21* 23 22 24 25   GLUCOSE 121* 96 128* 113* 136* 128*  BUN 64* 68* 73* 80* 87* 94*  CREATININE 4.30*   4.31* 4.91* 5.25* 5.51* 5.81* 5.90*  CALCIUM 8.2* 8.0* 8.3* 8.5* 8.3* 8.6*  MG 1.9  --   --   --   --   --   PHOS  --   --   --   --   --  5.4*    GFR Estimated Creatinine Clearance: 18.9 mL/min (A) (by C-G formula based on SCr of  5.9 mg/dL (H)). Liver Function Tests: Recent Labs  Lab 07/15/21 1130 07/21/21 0136  AST 17  --   ALT 19  --   ALKPHOS 78  --   BILITOT 0.5  --   PROT 6.0*  --   ALBUMIN 2.8* 2.8*    No results for input(s): LIPASE, AMYLASE in the last 168 hours. No results for input(s): AMMONIA in the last 168 hours. Coagulation profile No results for input(s): INR, PROTIME in the last 168 hours. COVID-19 Labs  Recent Labs    07/19/21 0323  FERRITIN 99     Lab Results  Component Value Date   SARSCOV2NAA NEGATIVE 07/15/2021   [place] NEGATIVE 04/16/2021   [place] NEGATIVE 09/27/2020   [place] NEGATIVE 06/14/2020    CBC: Recent Labs  Lab 07/15/21 1130 07/15/21 1259 07/16/21 0220  07/17/21 0258 07/21/21 0136  WBC 4.9 5.2 5.7 4.9 6.2  HGB 9.4* 9.5* 9.1* 8.7* 9.4*  HCT 29.9* 29.9* 27.7* 27.6* 29.2*  MCV 84.9 85.9 84.7 84.9 83.2  PLT 261 277 255 270 315    Cardiac Enzymes: No results for input(s): CKTOTAL, CKMB, CKMBINDEX, TROPONINI in the last 168 hours. BNP (last 3 results) No results for input(s): PROBNP in the last 8760 hours. CBG: Recent Labs  Lab 07/20/21 0746 07/20/21 1135 07/20/21 1807 07/20/21 2112 07/21/21 0817  GLUCAP 156* 118* 140* 175* 169*    D-Dimer: No results for input(s): DDIMER in the last 72 hours. Hgb A1c: No results for input(s): HGBA1C in the last 72 hours. Lipid Profile: No results for input(s): CHOL, HDL, LDLCALC, TRIG, CHOLHDL, LDLDIRECT in the last 72 hours. Thyroid function studies: No results for input(s): TSH, T4TOTAL, T3FREE, THYROIDAB in the last 72 hours.  Invalid input(s): FREET3 Anemia work up: Recent Labs    07/19/21 0323  FERRITIN 99  TIBC 293  IRON 79    Sepsis Labs: Recent Labs  Lab 07/15/21 1130 07/15/21 1259 07/16/21 0220 07/17/21 0258 07/21/21 0136  WBC 4.9 5.2 5.7 4.9 6.2  LATICACIDVEN 1.4  --   --   --   --     Microbiology Recent Results (from the past 240 hour(s))  Resp Panel by RT-PCR  (Flu A&B, Covid) Nasopharyngeal Swab     Status: None   Collection Time: 07/15/21  1:00 PM   Specimen: Nasopharyngeal Swab; Nasopharyngeal(NP) swabs in vial transport medium  Result Value Ref Range Status   SARS Coronavirus 2 by RT PCR NEGATIVE NEGATIVE Final    Comment: (NOTE) SARS-CoV-2 target nucleic acids are NOT DETECTED.  The SARS-CoV-2 RNA is generally detectable in upper respiratory specimens during the acute phase of infection. The lowest concentration of SARS-CoV-2 viral copies this assay can detect is 138 copies/mL. A negative result does not preclude SARS-Cov-2 infection and should not be used as the sole basis for treatment or other patient management decisions. A negative result may occur with  improper specimen collection/handling, submission of specimen other than nasopharyngeal swab, presence of viral mutation(s) within the areas targeted by this assay, and inadequate number of viral copies(<138 copies/mL). A negative result must be combined with clinical observations, patient history, and epidemiological information. The expected result is Negative.  Fact Sheet for Patients:  EntrepreneurPulse.com.au  Fact Sheet for Healthcare Providers:  IncredibleEmployment.be  This test is no t yet approved or cleared by the [place] FDA and  has been authorized for detection and/or diagnosis of SARS-CoV-2 by FDA under an Emergency Use Authorization (EUA). This EUA will remain  in effect (meaning this test can be used) for the duration of the COVID-19 declaration under Section 564(b)(1) of the Act, 21 U.S.C.section 360bbb-3(b)(1), unless the authorization is terminated  or revoked sooner.       Influenza A by PCR NEGATIVE NEGATIVE Final   Influenza B by PCR NEGATIVE NEGATIVE Final    Comment: (NOTE) The Xpert Xpress SARS-CoV-2/FLU/RSV plus assay is intended as an aid in the diagnosis of influenza from Nasopharyngeal swab specimens  and should not be used as a sole basis for treatment. Nasal washings and aspirates are unacceptable for Xpert Xpress SARS-CoV-2/FLU/RSV testing.  Fact Sheet for Patients: EntrepreneurPulse.com.au  Fact Sheet for Healthcare Providers: IncredibleEmployment.be  This test is not yet approved or cleared by the [place] FDA and has been authorized for detection and/or diagnosis of SARS-CoV-2 by FDA under an Emergency  Use Authorization (EUA). This EUA will remain in effect (meaning this test can be used) for the duration of the COVID-19 declaration under Section 564(b)(1) of the Act, 21 U.S.C. section 360bbb-3(b)(1), unless the authorization is terminated or revoked.  Performed at [facility], [place] [address] [place], [place] [address]      Medications:    darbepoetin (ARANESP) injection - NON-DIALYSIS  100 mcg Subcutaneous Q Mon-1800   heparin  5,000 Units Subcutaneous Q8H   hydrALAZINE  100 mg Oral TID   insulin aspart  0-6 Units Subcutaneous TID WC   labetalol  600 mg Oral BID   metolazone  5 mg Oral Daily   pravastatin  40 mg Oral Daily   Continuous Infusions:  ferric gluconate (FERRLECIT) IVPB 250 mg (07/21/21 1111)   furosemide 160 mg (07/21/21 0909)      LOS: 6 days   Charlynne Cousins  [facility]  07/21/2021, 11:23 AM

## 2021-07-21 NOTE — H&P (View-Only) (Signed)
Hospital Consult    Reason for Consult: Dialysis access Requesting Physician: Dr. Joylene Grapes MRN #:  [id number]  History of Present Illness: This is a 55 y.o. male with past medical history significant for diabetes mellitus, hypertension, CHF, and CKD.  He is being seen in consultation for evaluation for dialysis access and as he is approaching initiation of HD.  He is left arm dominant and would prefer access placement in his right arm.  He is hesitant to proceed with a tunneled dialysis catheter as his sister-in-law passed away 2 weeks after placement of a dialysis catheter.  He is not on any blood thinners.  He does not have a pacemaker.  He has not had any trauma or surgery to his right arm in the past.  Vein mapping is pending.  Past Medical History:  Diagnosis Date   CHF (congestive heart failure) (HCC)    Diabetes mellitus    Hypertension     Past Surgical History:  Procedure Laterality Date   INCISION AND DRAINAGE     KNEE SURGERY      No Known Allergies  Prior to Admission medications   Medication Sig Start Date End Date Taking? Authorizing Provider  acetaminophen (TYLENOL) 325 MG tablet Take 2 tablets (650 mg total) by mouth every 6 (six) hours as needed for mild pain (or Fever >/= 101). 10/01/20  Yes Emokpae, Courage, MD  amLODipine (NORVASC) 10 MG tablet Take 1 tablet (10 mg total) by mouth daily. 10/01/20  Yes Emokpae, Courage, MD  Garlic Oil 7846 MG CAPS Take 100 mg by mouth every morning.   Yes [provider]  glimepiride (AMARYL) 4 MG tablet Take 0.5 tablets (2 mg total) by mouth 2 (two) times daily. 04/23/21  Yes Barton Dubois, MD  hydrALAZINE (APRESOLINE) 50 MG tablet Take 1.5 tablets (75 mg total) by mouth 3 (three) times daily. 02/11/21 07/15/21 Yes Branch, Alphonse Guild, MD  labetalol (NORMODYNE) 300 MG tablet Take 2 tablets (600 mg total) by mouth 2 (two) times daily. 06/24/21  Yes Larey Dresser, MD  lisinopril (ZESTRIL) 10 MG tablet Take 10 mg by mouth  daily.   Yes [provider]  metolazone (ZAROXOLYN) 5 MG tablet Take 1 tablet (5 mg total) by mouth daily. Take 5mg  (1 TAB) daily Monday to Friday. Skip the weekend 06/24/21  Yes Larey Dresser, MD  Multiple Vitamin (MULTIVITAMIN WITH MINERALS) TABS tablet Take 1 tablet by mouth daily.   Yes [provider]  pravastatin (PRAVACHOL) 40 MG tablet Take 40 mg by mouth in the morning.   Yes [provider]  torsemide (DEMADEX) 100 MG tablet Take 100 mg by mouth 2 (two) times daily. 05/13/21  Yes [provider]  Vitamin D, Ergocalciferol, (DRISDOL) 1.25 MG (50000 UNIT) CAPS capsule Take 50,000 Units by mouth every Friday.   Yes [provider]  Zinc 50 MG CAPS Take 50 mg by mouth every morning.   Yes [provider]    Social History   Socioeconomic History   Marital status: Married    Spouse name: Not on file   Number of children: Not on file   Years of education: Not on file   Highest education level: Not on file  Occupational History   Not on file  Tobacco Use   Smoking status: Never   Smokeless tobacco: Never  Vaping Use   Vaping Use: Never used  Substance and Sexual Activity   Alcohol use: No   Drug use: No  Sexual activity: Not on file  Other Topics Concern   Not on file  Social History Narrative   Not on file   Social Determinants of Health   Financial Resource Strain: High Risk   Difficulty of Paying Living Expenses: Hard  Food Insecurity: Food Insecurity Present   Worried About Running Out of Food in the Last Year: Sometimes true   Ran Out of Food in the Last Year: Never true  Transportation Needs: No Transportation Needs   Lack of Transportation (Medical): No   Lack of Transportation (Non-Medical): No  Physical Activity: Not on file  Stress: Not on file  Social Connections: Not on file  Intimate Partner Violence: Not on file     Family History  Problem Relation Age of Onset   Dementia Mother    Diabetes  Father     ROS: Otherwise negative unless mentioned in HPI  Physical Examination  Vitals:   07/21/21 0530 07/21/21 0850  BP: (!) 146/75 (!) 162/82  Pulse: 85 88  Resp: 17   Temp: 98.4 F (36.9 C)   SpO2: 98%    Body mass index is 38.87 kg/m.  General:  WDWN in NAD Gait: Not observed HENT: WNL, normocephalic Pulmonary: normal non-labored breathing, without Rales, rhonchi,  wheezing Cardiac: regular, Abdomen:  soft, NT/ND, no masses Skin: without rashes Vascular Exam/Pulses: Symmetrical radial pulses Extremities: without ischemic changes, without Gangrene , without cellulitis; without open wounds;  Musculoskeletal: no muscle wasting or atrophy  Neurologic: A&O X 3;  No focal weakness or paresthesias are detected; speech is fluent/normal Psychiatric:  The pt has Normal affect. Lymph:  Unremarkable  CBC    Component Value Date/Time   WBC 6.2 07/21/2021 0136   RBC 3.51 (L) 07/21/2021 0136   HGB 9.4 (L) 07/21/2021 0136   HCT 29.2 (L) 07/21/2021 0136   PLT 315 07/21/2021 0136   MCV 83.2 07/21/2021 0136   MCH 26.8 07/21/2021 0136   MCHC 32.2 07/21/2021 0136   RDW 14.3 07/21/2021 0136   LYMPHSABS 1.0 04/16/2021 0847   MONOABS 0.6 04/16/2021 0847   EOSABS 0.2 04/16/2021 0847   BASOSABS 0.0 04/16/2021 0847    BMET    Component Value Date/Time   NA 137 07/21/2021 0136   NA 145 (H) 04/28/2020 1435   K 4.6 07/21/2021 0136   CL 101 07/21/2021 0136   CO2 25 07/21/2021 0136   GLUCOSE 128 (H) 07/21/2021 0136   BUN 94 (H) 07/21/2021 0136   BUN 34 (H) 04/28/2020 1435   CREATININE 5.90 (H) 07/21/2021 0136   CALCIUM 8.6 (L) 07/21/2021 0136   CALCIUM 8.2 (L) 06/08/2021 1117   GFRNONAA 10 (L) 07/21/2021 0136   GFRAA 57 (L) 04/28/2020 1435    COAGS: Lab Results  Component Value Date   INR 1.0 05/03/2020     Non-Invasive Vascular Imaging:   Vein mapping pending    ASSESSMENT/PLAN: This is a 55 y.o. male with CKD progressing towards ESRD.  Consult requested for  [facility] and permanent dialysis access placement  -Vein mapping of bilateral upper extremities pending -Patient is left arm dominant and would prefer access placement in his right arm; restrict RUE -Patient's sister-in-law passed away several weeks after placement of a HD catheter which became infected.  After discussing access options with the patient he is now agreeable to proceed with [facility] placement and fistula creation.  He was made aware he would require use of TDC for at least 3 months while the fistula matured. -Plan will be  for [facility] and right arm fistula creation tomorrow if schedule permits.  On-call vascular surgeon Dr. Trula Slade will evaluate the patient later today provide further treatment plans   Dagoberto Ligas PA-C [facility] [phone]   I agree with the above.  I have seen and evaluated the patient.  This is a 55 year old gentleman with end-stage renal disease.  Request has been for a tunneled dialysis catheter and permanent access.  The patient is left-handed and so we will place a right arm fistula.  I have reviewed his vein mapping which shows an adequate cephalic vein on the right.  Details of the procedure as well as the risks and benefits were discussed with the patient and he wishes to proceed  [name]

## 2021-07-21 NOTE — Progress Notes (Addendum)
Advanced Heart Failure Rounding Note  PCP-Cardiologist: Carlyle Dolly, MD   Subjective:    SCr still climbing, 4.91>>5.25>>5.51>>5.81>5.90. BUN increasing, up to 94. UOP picking up, 2.4L out yesterday. Remains fluid overloaded. Still w/ exertional dyspnea and orthopnea. No resting dyspnea. Feels week today but no other uremic symptoms. Understands he is nearing the need for HD and willing to proceed w/ vascular access.   K 4.6  Per radiologists interpretation, PYP scan highly suggestive of TTR amyloid.   PYP scan 12/28  FINDINGS: Planar Visual assessment:   Anterior planar imaging demonstrates radiotracer uptake within the heart greater than uptake within the adjacent ribs (Grade 2).   Quantitative assessment :   Quantitative assessment of the cardiac uptake compared to the contralateral chest wall is equal to (H/CL = 1.18).   SPECT assessment: SPECT imaging of the chest demonstrates mild radiotracer accumulation within the LEFT ventricle.   IMPRESSION: Visual and quantitative assessment (grade 2, H/CLL equal 1.18) are strongly suggestive of transthyretin amyloidosis.    Objective:   Weight Range: 126.4 kg Body mass index is 38.87 kg/m.   Vital Signs:   Temp:  [97.9 F (36.6 C)-98.8 F (37.1 C)] 98.4 F (36.9 C) (12/29 0530) Pulse Rate:  [82-85] 85 (12/29 0530) Resp:  [17-18] 17 (12/29 0530) BP: (138-162)/(68-85) 146/75 (12/29 0530) SpO2:  [98 %-100 %] 98 % (12/29 0530) Weight:  [126.4 kg] 126.4 kg (12/29 0500) Last BM Date: 07/16/21  Weight change: Filed Weights   07/19/21 0544 07/20/21 0500 07/21/21 0500  Weight: 128.8 kg 127 kg 126.4 kg    Intake/Output:   Intake/Output Summary (Last 24 hours) at 07/21/2021 0747 Last data filed at 07/21/2021 0610 Gross per 24 hour  Intake 270 ml  Output 2425 ml  Net -2155 ml      Physical Exam    General:  obese, mildly fatigued appearing. Resting in bed. No respiratory difficulty HEENT: normal Neck:  supple. Thick neck, JVD to jaw. Carotids 2+ bilat; no bruits. No lymphadenopathy or thyromegaly appreciated. Cor: PMI nondisplaced. Regular rate & rhythm. No rubs, gallops or murmurs. Lungs: decreased BS at the bases bilaterally  Abdomen: soft, nontender, nondistended. No hepatosplenomegaly. No bruits or masses. Good bowel sounds. Extremities: no cyanosis, clubbing, rash, 2+ bilateral LE edema + Unna boots Neuro: alert & oriented x 3, cranial nerves grossly intact. moves all 4 extremities w/o difficulty. Affect pleasant.  Telemetry   NSR 90s   EKG    No new EKG to review   Labs    CBC Recent Labs    07/21/21 0136  WBC 6.2  HGB 9.4*  HCT 29.2*  MCV 83.2  PLT 284    Basic Metabolic Panel Recent Labs    07/20/21 0427 07/21/21 0136  NA 137 137  K 4.9 4.6  CL 102 101  CO2 24 25  GLUCOSE 136* 128*  BUN 87* 94*  CREATININE 5.81* 5.90*  CALCIUM 8.3* 8.6*  PHOS  --  5.4*   Liver Function Tests Recent Labs    07/21/21 0136  ALBUMIN 2.8*   No results for input(s): LIPASE, AMYLASE in the last 72 hours. Cardiac Enzymes No results for input(s): CKTOTAL, CKMB, CKMBINDEX, TROPONINI in the last 72 hours.  BNP: BNP (last 3 results) Recent Labs    06/24/21 1250 07/15/21 1130 07/15/21 1259  BNP 615.1* 638.5* 620.0*    ProBNP (last 3 results) No results for input(s): PROBNP in the last 8760 hours.   D-Dimer No results for input(s): DDIMER  in the last 72 hours. Hemoglobin A1C No results for input(s): HGBA1C in the last 72 hours. Fasting Lipid Panel No results for input(s): CHOL, HDL, LDLCALC, TRIG, CHOLHDL, LDLDIRECT in the last 72 hours. Thyroid Function Tests No results for input(s): TSH, T4TOTAL, T3FREE, THYROIDAB in the last 72 hours.  Invalid input(s): FREET3  Other results:   Imaging    NM CARDIAC AMYLOID TUMOR LOC INFLAM SPECT 1 DAY  Result Date: 07/20/2021 CLINICAL DATA:  HEART FAILURE. CONCERN FOR CARDIAC AMYLOIDOSIS. EXAM: NUCLEAR MEDICINE  TUMOR LOCALIZATION. PYP CARDIAC AMYLOIDOSIS SCAN WITH SPECT TECHNIQUE: Following intravenous administration of radiopharmaceutical, anterior planar images of the chest were obtained. Regions of interest were placed on the heart and contralateral chest wall for quantitative assessment. Additional SPECT imaging of the chest was obtained. RADIOPHARMACEUTICALS:  20.7 mCi TECHNETIUM 99 PYROPHOSPHATE FINDINGS: Planar Visual assessment: Anterior planar imaging demonstrates radiotracer uptake within the heart greater than uptake within the adjacent ribs (Grade 2). Quantitative assessment : Quantitative assessment of the cardiac uptake compared to the contralateral chest wall is equal to (H/CL = 1.18). SPECT assessment: SPECT imaging of the chest demonstrates mild radiotracer accumulation within the LEFT ventricle. IMPRESSION: Visual and quantitative assessment (grade 2, H/CLL equal 1.18) are strongly suggestive of transthyretin amyloidosis. Electronically Signed   By: Kerby Moors M.D.   On: 07/20/2021 16:14     Medications:     Scheduled Medications:  darbepoetin (ARANESP) injection - NON-DIALYSIS  100 mcg Subcutaneous Q Mon-1800   heparin  5,000 Units Subcutaneous Q8H   hydrALAZINE  100 mg Oral TID   insulin aspart  0-6 Units Subcutaneous TID WC   labetalol  600 mg Oral BID   metolazone  5 mg Oral Daily   pravastatin  40 mg Oral Daily    Infusions:  ferric gluconate (FERRLECIT) IVPB Stopped (07/20/21 1137)   furosemide 160 mg (07/21/21 0217)    PRN Medications: acetaminophen **OR** acetaminophen     Assessment/Plan    1. Acute on Chronic diastolic CHF: Most recent echo in 9/22 showed EF 55-60%, moderate LVH, grade 2 diastolic dysfunction, normal RV size and systolic function.  Multiple admissions for CHF.  Cardiac amyloidosis (most likely transthyretin) is a concern, myeloma panel negative. Now admitted w/ a/c CHF w/ marked fluid overload with NYHA class IIIb-IV symptoms, c/b worsening  Stage IV CKD. SCr now up to 5.9 (previous baseline ~4.1). He is diuresing w/ high dose lasix, but slowly. Remains markedly fluid overloaded. Nephrology following.  - Continue IV Lasix 160 mg q8H per nephrology + metolazone 5 mg daily.   - May be nearing need for initiation of HD - continue UNNA boots. - Cannot use Jardiance or ARB/ARNi with elevated creatinine.  - Per radiologist interpretation, PYP scan highly suggestive of TTR amyloid. Dr. Aundra Dubin to review. May need Tafamidis    2. AKI on CKD stage 4: Pre admit baseline Scr ~ 4.18. Now steadily trending up, 4.91>>5.25>>5.51>>5.81>5.9. BUN 94. K 4.6 - He followed with Dr. Theador Hawthorne outpatient. - inpatient nephrology team now following and helping to dose diuretics  - nonoliguric, but may be nearing HD  - nephrology planing vein mapping for HD access    3. OSA: CPAP ordered, pt refusing.    4. HTN: moderately elevated today  - continue current regimen, would avoid agressive lowering to help w/ renal perfusion - Continue labetalol 600 mg bid for now. - Continue hydralazine 100 mg tid.  - Off lisinopril with hyperkalemia   5. DM2 - cover with SSI  6. Hyperkalemia - resolved, 4.6 today  - continue w/ IV Lasix per nephrology  - Lokelma PRN    Length of Stay: [address] PA-C  07/21/2021, 7:47 AM  Advanced Heart Failure Team Pager [phone] (M-F; 7a - 5p)  Please contact [facility] for night-coverage after hours (5p -7a ) and weekends on [email or web address]  Patient seen with PA, agree with the above note.   Weight gradually coming down with high dose diuretics, but BUN and creatinine slowly rising.    PYP scan done and reviewed, somewhat equivocal but think likely suggestive of TTR cardiac amyloidosis.   General: NAD Neck: JVP 9-10 cm, no thyromegaly or thyroid nodule.  Lungs: Clear to auscultation bilaterally with normal respiratory effort. CV: Nondisplaced PMI.  Heart regular S1/S2, no S3/S4, no murmur.  1+ edema 1/2  to knees.  Abdomen: Soft, nontender, no hepatosplenomegaly, no distention.  Skin: Intact without lesions or rashes.  Neurologic: Alert and oriented x 3.  Psych: Normal affect. Extremities: No clubbing or cyanosis.  HEENT: Normal.   Volume status improving though renal function slowly worsening.  Agree with continuing IV diuresis today, think he will be ready to resume po tomorrow.  Needs planning for future HD, which I suspect will be needed.   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  This fits the echo.  Will need genetic testing and initiation of tafamidis as outpatient.   Loralie Champagne 07/21/2021 3:41 PM

## 2021-07-21 NOTE — Progress Notes (Signed)
[facility] Progress Note  Name: [name] MRN: [id number] DOB: 02/05/1966   Subjective:  Fairly good urine output with 2.4 L made.  Remains volume overloaded.  Continue with diuretics.  More nauseated today and difficult tolerating p.o. this morning.  After further consideration with his family he is more open to start dialysis at this time and is willing to get vascular access.  He will likely remain on dialysis long-term given baseline CKD.  He is hesitant to get a dialysis catheter due to a friend who had a catheter get infected and die fairly quickly.  Will consult [facility] for access placement; consideration of early access graft.  --------------- Background on consult:  [name] is a 55 y.o. 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  Note that on 06/24/2021 his metolazone was increased to 5 mg 5 days a week given persistent overload at that time per charting.  When he was seen in heart failure follow-up clinic on 12/23 his weight was noted to be up more than 20 pounds and he was directed to the hospital for IV diuretics.  Dr. Haroldine [name] recommended Lasix 160 mg IV every 6 hours plus metolazone 5 mg daily with the knowledge that he may need to start dialysis if he does not respond.  Cardiology is also planning to rule out amyloid as well.  Creatinine trends as below.  700 and mils of urine recorded on 12/23 and 1.3 L as well as one unmeasured urine void reported today.  He follows with Dr. Theador [name].  Per nephrology notes his metolazone was increased to 5 mg three times a week at the 06/08/21 office appointment.  He does think that his thighs don't feel tight since yesterday.  He would want dialysis if it were needed - he thought was going to have to start when GFR was 12 but it got better.  He does not have access.  Short of breath after ambulating to restroom - just had diarrhea.    Intake/Output Summary (Last 24 hours) at 07/21/2021 0945 Last data filed at 07/21/2021 0610 Gross per 24 hour  Intake 270 ml  Output 1575 ml  Net -1305 ml    Vitals:  Vitals:   07/20/21 1954 07/21/21 0500 07/21/21 0530 07/21/21 0850  BP: (!) 162/77  (!) 146/75 (!) 162/82  Pulse:   85 88  Resp:   17   Temp: 97.9 F (36.6 C)  98.4 F (36.9 C)   TempSrc: Oral  Oral   SpO2:   98%   Weight:  126.4 kg    Height:         Physical Exam:   General: lying in bed, nad HEENT: NCAT Eyes: EOMI sclera anicteric Heart: normal rate Lungs: Bilateral chest rise, no increased work of breathing Abdomen: softly distended /nt [name] habitus Extremities: 2+ pitting edema bilateral lower extremities Skin: chronic venous stasis changes Neuro: alert and oriented x 3 provides hx and follows commands Pysch normal mood and affect GU no foley  Medications reviewed   Labs:  BMP Latest Ref Rng & Units 07/21/2021 07/20/2021 07/19/2021  Glucose 70 - 99 mg/dL 128(H) 136(H) 113(H)  BUN 6 - 20 mg/dL 94(H) 87(H) 80(H)  Creatinine 0.61 - 1.24 mg/dL 5.90(H) 5.81(H) 5.51(H)  BUN/Creat Ratio 9 - 20 - - -  Sodium 135 - 145 mmol/L  137 137 139  Potassium 3.5 - 5.1 mmol/L 4.6 4.9 5.2(H)  Chloride 98 - 111 mmol/L 101 102 106  CO2 22 - 32 mmol/L 25 24 22   Calcium 8.9 - 10.3 mg/dL 8.6(L) 8.3(L) 8.5(L)     Assessment/Plan:   # Acute on chronic diastolic CHF  - Continue lasix 160 mg IV every 8 hours and metolazone daily - note weight 139.7 kg on 12/23 for reference-  is down to 126 but remains volume overloaded   # CKD stage IV  - Cr was 3.74 with GFR 18 in November 2022 and in September GFR below 15 - holding home lisinopril and amlodipine - changed to renal DM diet with fluid restriction 1.2 liters/day - He follows with an outside practice, Dr. Theador [name] [facility] -  needs access-patient has been hesitant to  undergo dialysis and receive access placement.  He had a friend who died with a tunneled dialysis catheter and he wants to avoid this if possible. -He does not have an absolute emergent need for dialysis but is getting near.  We will consult [facility] for access placement. Would like for him to get an access we can use quickly; given his hesitancy for [facility] would see if early access graft is an option. If not then AVF/AVG + TDC would be acceptable if patient agrees. Appreciate help from [facility].    # HTN  - optimize volume status with diuretics as above-   is still needing continued IV diuretics  - consider restarting norvasc if BP remains high   # OSA - note decreased respiratory reserve   # DM type 2 - per primary team   - Anemia-  iron stores just a little low, will replete and have added ESA -  Hgb 9.4  Hyperkalemia-  resolved     Reesa Chew, MD 07/21/2021 9:45 AM

## 2021-07-21 NOTE — Consult Note (Addendum)
Hospital Consult    Reason for Consult: Dialysis access Requesting Physician: Dr. Joylene Grapes MRN #:  [id number]  History of Present Illness: This is a 55 y.o. male with past medical history significant for diabetes mellitus, hypertension, CHF, and CKD.  He is being seen in consultation for evaluation for dialysis access and as he is approaching initiation of HD.  He is left arm dominant and would prefer access placement in his right arm.  He is hesitant to proceed with a tunneled dialysis catheter as his sister-in-law passed away 2 weeks after placement of a dialysis catheter.  He is not on any blood thinners.  He does not have a pacemaker.  He has not had any trauma or surgery to his right arm in the past.  Vein mapping is pending.  Past Medical History:  Diagnosis Date   CHF (congestive heart failure) (HCC)    Diabetes mellitus    Hypertension     Past Surgical History:  Procedure Laterality Date   INCISION AND DRAINAGE     KNEE SURGERY      No Known Allergies  Prior to Admission medications   Medication Sig Start Date End Date Taking? Authorizing Provider  acetaminophen (TYLENOL) 325 MG tablet Take 2 tablets (650 mg total) by mouth every 6 (six) hours as needed for mild pain (or Fever >/= 101). 10/01/20  Yes Emokpae, Courage, MD  amLODipine (NORVASC) 10 MG tablet Take 1 tablet (10 mg total) by mouth daily. 10/01/20  Yes Emokpae, Courage, MD  Garlic Oil 7673 MG CAPS Take 100 mg by mouth every morning.   Yes [provider]  glimepiride (AMARYL) 4 MG tablet Take 0.5 tablets (2 mg total) by mouth 2 (two) times daily. 04/23/21  Yes Barton Dubois, MD  hydrALAZINE (APRESOLINE) 50 MG tablet Take 1.5 tablets (75 mg total) by mouth 3 (three) times daily. 02/11/21 07/15/21 Yes Branch, Alphonse Guild, MD  labetalol (NORMODYNE) 300 MG tablet Take 2 tablets (600 mg total) by mouth 2 (two) times daily. 06/24/21  Yes Larey Dresser, MD  lisinopril (ZESTRIL) 10 MG tablet Take 10 mg by mouth  daily.   Yes [provider]  metolazone (ZAROXOLYN) 5 MG tablet Take 1 tablet (5 mg total) by mouth daily. Take 5mg  (1 TAB) daily Monday to Friday. Skip the weekend 06/24/21  Yes Larey Dresser, MD  Multiple Vitamin (MULTIVITAMIN WITH MINERALS) TABS tablet Take 1 tablet by mouth daily.   Yes [provider]  pravastatin (PRAVACHOL) 40 MG tablet Take 40 mg by mouth in the morning.   Yes [provider]  torsemide (DEMADEX) 100 MG tablet Take 100 mg by mouth 2 (two) times daily. 05/13/21  Yes [provider]  Vitamin D, Ergocalciferol, (DRISDOL) 1.25 MG (50000 UNIT) CAPS capsule Take 50,000 Units by mouth every Friday.   Yes [provider]  Zinc 50 MG CAPS Take 50 mg by mouth every morning.   Yes [provider]    Social History   Socioeconomic History   Marital status: Married    Spouse name: Not on file   Number of children: Not on file   Years of education: Not on file   Highest education level: Not on file  Occupational History   Not on file  Tobacco Use   Smoking status: Never   Smokeless tobacco: Never  Vaping Use   Vaping Use: Never used  Substance and Sexual Activity   Alcohol use: No   Drug use: No  Sexual activity: Not on file  Other Topics Concern   Not on file  Social History Narrative   Not on file   Social Determinants of Health   Financial Resource Strain: High Risk   Difficulty of Paying Living Expenses: Hard  Food Insecurity: Food Insecurity Present   Worried About Running Out of Food in the Last Year: Sometimes true   Ran Out of Food in the Last Year: Never true  Transportation Needs: No Transportation Needs   Lack of Transportation (Medical): No   Lack of Transportation (Non-Medical): No  Physical Activity: Not on file  Stress: Not on file  Social Connections: Not on file  Intimate Partner Violence: Not on file     Family History  Problem Relation Age of Onset   Dementia Mother    Diabetes  Father     ROS: Otherwise negative unless mentioned in HPI  Physical Examination  Vitals:   07/21/21 0530 07/21/21 0850  BP: (!) 146/75 (!) 162/82  Pulse: 85 88  Resp: 17   Temp: 98.4 F (36.9 C)   SpO2: 98%    Body mass index is 38.87 kg/m.  General:  WDWN in NAD Gait: Not observed HENT: WNL, normocephalic Pulmonary: normal non-labored breathing, without Rales, rhonchi,  wheezing Cardiac: regular, Abdomen:  soft, NT/ND, no masses Skin: without rashes Vascular Exam/Pulses: Symmetrical radial pulses Extremities: without ischemic changes, without Gangrene , without cellulitis; without open wounds;  Musculoskeletal: no muscle wasting or atrophy  Neurologic: A&O X 3;  No focal weakness or paresthesias are detected; speech is fluent/normal Psychiatric:  The pt has Normal affect. Lymph:  Unremarkable  CBC    Component Value Date/Time   WBC 6.2 07/21/2021 0136   RBC 3.51 (L) 07/21/2021 0136   HGB 9.4 (L) 07/21/2021 0136   HCT 29.2 (L) 07/21/2021 0136   PLT 315 07/21/2021 0136   MCV 83.2 07/21/2021 0136   MCH 26.8 07/21/2021 0136   MCHC 32.2 07/21/2021 0136   RDW 14.3 07/21/2021 0136   LYMPHSABS 1.0 04/16/2021 0847   MONOABS 0.6 04/16/2021 0847   EOSABS 0.2 04/16/2021 0847   BASOSABS 0.0 04/16/2021 0847    BMET    Component Value Date/Time   NA 137 07/21/2021 0136   NA 145 (H) 04/28/2020 1435   K 4.6 07/21/2021 0136   CL 101 07/21/2021 0136   CO2 25 07/21/2021 0136   GLUCOSE 128 (H) 07/21/2021 0136   BUN 94 (H) 07/21/2021 0136   BUN 34 (H) 04/28/2020 1435   CREATININE 5.90 (H) 07/21/2021 0136   CALCIUM 8.6 (L) 07/21/2021 0136   CALCIUM 8.2 (L) 06/08/2021 1117   GFRNONAA 10 (L) 07/21/2021 0136   GFRAA 57 (L) 04/28/2020 1435    COAGS: Lab Results  Component Value Date   INR 1.0 05/03/2020     Non-Invasive Vascular Imaging:   Vein mapping pending    ASSESSMENT/PLAN: This is a 55 y.o. male with CKD progressing towards ESRD.  Consult requested for  [facility] and permanent dialysis access placement  -Vein mapping of bilateral upper extremities pending -Patient is left arm dominant and would prefer access placement in his right arm; restrict RUE -Patient's sister-in-law passed away several weeks after placement of a HD catheter which became infected.  After discussing access options with the patient he is now agreeable to proceed with [facility] placement and fistula creation.  He was made aware he would require use of TDC for at least 3 months while the fistula matured. -Plan will be  for [facility] and right arm fistula creation tomorrow if schedule permits.  On-call vascular surgeon Dr. Trula Slade will evaluate the patient later today provide further treatment plans   Dagoberto Ligas PA-C [facility] [phone]   I agree with the above.  I have seen and evaluated the patient.  This is a 55 year old gentleman with end-stage renal disease.  Request has been for a tunneled dialysis catheter and permanent access.  The patient is left-handed and so we will place a right arm fistula.  I have reviewed his vein mapping which shows an adequate cephalic vein on the right.  Details of the procedure as well as the risks and benefits were discussed with the patient and he wishes to proceed  [name]

## 2021-07-21 NOTE — Progress Notes (Signed)
Physical Therapy Treatment Patient Details Name: [name] MRN: [id number] DOB: Oct 07, 1965 Today's Date: 07/21/2021   History of Present Illness Pt is a 55 y.o. M who presents 07/15/2021 with volume overload. Significant PMH: chronic HFpEF, stage IV CKD, T2DM, HTN, anemia.    PT Comments    The pt was able to make good progress with OOB mobility and endurance at this time. He completed ~500 ft hallway ambulation with addition of "speed intervals" prior to need for seated rest. He was then able to complete ~200 additional ft of ambulation with minG for safety and continued speed intervals prior to needing to return to seated rest in his room. Will continue to progress with exercises and endurance training. Pt educated in plan for progressive ambulation program at home with family.     Recommendations for follow up therapy are one component of a multi-disciplinary discharge planning process, led by the attending physician.  Recommendations may be updated based on patient status, additional functional criteria and insurance authorization.  Follow Up Recommendations  Home health PT     Assistance Recommended at Discharge PRN  Equipment Recommendations  None recommended by PT    Recommendations for Other Services       Precautions / Restrictions Precautions Precautions: Fall Precaution Comments: watch Spo2 Restrictions Weight Bearing Restrictions: No     Mobility  Bed Mobility Overal bed mobility: Modified Independent             General bed mobility comments: able to complete with slightly increased time and HOB raised, no assist    Transfers Overall transfer level: Modified independent Equipment used: None;Rollator (4 wheels)               General transfer comment: no AD needed, educated on transfer technique with 4-wheel walker    Ambulation/Gait Ambulation/Gait assistance: Supervision Gait Distance (Feet): 500 Feet (+ 200 ft after seated rest  break) Assistive device: Rollator (4 wheels) Gait Pattern/deviations: Step-through pattern;Decreased stride length Gait velocity: 0.67m/s self-selected gait speed, 0.53m/s when asked to walk fast Gait velocity interpretation: <1.8 ft/sec, indicate of risk for recurrent falls   General Gait Details: slow but steady, reprots improved stability and less fatigue with 4-wheel walker. added x4 15 ft speed intervals       Balance Overall balance assessment: Needs assistance Sitting-balance support: Feet supported Sitting balance-Leahy Scale: Good     Standing balance support: No upper extremity supported;During functional activity Standing balance-Leahy Scale: Fair Standing balance comment: BUE for gait due to SOB with exertion                            Cognition Arousal/Alertness: Awake/alert Behavior During Therapy: WFL for tasks assessed/performed Overall Cognitive Status: Within Functional Limits for tasks assessed                                          Exercises Other Exercises Other Exercises: speed intervals with walking. x4 ~15 ft    General Comments General comments (skin integrity, edema, etc.): VSS on RA. dro pto 89% with exertion after 500 ft and speed intervals, recovered to 95% with seated rest      Pertinent Vitals/Pain Pain Assessment: No/denies pain     PT Goals (current goals can now be found in the care plan section) Acute Rehab PT Goals Patient Stated Goal: be  able to go out to eat with family PT Goal Formulation: With patient Time For Goal Achievement: 08/02/21 Potential to Achieve Goals: Good Progress towards PT goals: Progressing toward goals    Frequency    Min 3X/week      PT Plan Current plan remains appropriate       AM-PAC PT "6 Clicks" Mobility   Outcome Measure  Help needed turning from your back to your side while in a flat bed without using bedrails?: None Help needed moving from lying on your back  to sitting on the side of a flat bed without using bedrails?: None Help needed moving to and from a bed to a chair (including a wheelchair)?: None Help needed standing up from a chair using your arms (e.g., wheelchair or bedside chair)?: None Help needed to walk in hospital room?: A Little Help needed climbing 3-5 steps with a railing? : A Little 6 Click Score: 22    End of Session Equipment Utilized During Treatment: Gait belt Activity Tolerance: Patient tolerated treatment well Patient left: in bed;with call bell/phone within reach Nurse Communication: Mobility status PT Visit Diagnosis: Unsteadiness on feet (R26.81);Difficulty in walking, not elsewhere classified (R26.2)     Time: 5456-2563 PT Time Calculation (min) (ACUTE ONLY): 17 min  Charges:  $Therapeutic Exercise: 8-22 mins                     West Carbo, PT, DPT   Acute Rehabilitation Department Pager #: [phone]   Sandra Cockayne 07/21/2021, 3:59 PM

## 2021-07-22 ENCOUNTER — Encounter (HOSPITAL_COMMUNITY): Payer: Self-pay | Admitting: Internal Medicine

## 2021-07-22 ENCOUNTER — Inpatient Hospital Stay (HOSPITAL_COMMUNITY): Payer: 59

## 2021-07-22 ENCOUNTER — Inpatient Hospital Stay (HOSPITAL_COMMUNITY): Payer: 59 | Admitting: Anesthesiology

## 2021-07-22 ENCOUNTER — Encounter (HOSPITAL_COMMUNITY)
Admission: EM | Disposition: A | Discharge status: Home / Self Care | Payer: Self-pay | Source: Home / Self Care | Attending: Family Medicine

## 2021-07-22 DIAGNOSIS — Z992 Dependence on renal dialysis: Secondary | ICD-10-CM

## 2021-07-22 DIAGNOSIS — N186 End stage renal disease: Secondary | ICD-10-CM

## 2021-07-22 DIAGNOSIS — I5033 Acute on chronic diastolic (congestive) heart failure: Secondary | ICD-10-CM | POA: Diagnosis not present

## 2021-07-22 HISTORY — PX: AV FISTULA PLACEMENT: SHX1204

## 2021-07-22 HISTORY — PX: INSERTION OF DIALYSIS CATHETER: SHX1324

## 2021-07-22 LAB — MULTIPLE MYELOMA PANEL, SERUM
Albumin SerPl Elph-Mcnc: 2.8 g/dL — ABNORMAL LOW (ref 2.9–4.4)
Albumin/Glob SerPl: 1 (ref 0.7–1.7)
Alpha 1: 0.3 g/dL (ref 0.0–0.4)
Alpha2 Glob SerPl Elph-Mcnc: 0.7 g/dL (ref 0.4–1.0)
B-Globulin SerPl Elph-Mcnc: 0.8 g/dL (ref 0.7–1.3)
Gamma Glob SerPl Elph-Mcnc: 1.2 g/dL (ref 0.4–1.8)
Globulin, Total: 2.9 g/dL (ref 2.2–3.9)
IgA: 164 mg/dL (ref 90–386)
IgG (Immunoglobin G), Serum: 1245 mg/dL (ref 603–1613)
IgM (Immunoglobulin M), Srm: 47 mg/dL (ref 20–172)
Total Protein ELP: 5.7 g/dL — ABNORMAL LOW (ref 6.0–8.5)

## 2021-07-22 LAB — GLUCOSE, CAPILLARY
Glucose-Capillary: 129 mg/dL — ABNORMAL HIGH (ref 70–99)
Glucose-Capillary: 117 mg/dL — ABNORMAL HIGH (ref 70–99)
Glucose-Capillary: 192 mg/dL — ABNORMAL HIGH (ref 70–99)
Glucose-Capillary: 216 mg/dL — ABNORMAL HIGH (ref 70–99)

## 2021-07-22 LAB — RENAL FUNCTION PANEL
Albumin: 2.8 g/dL — ABNORMAL LOW (ref 3.5–5.0)
Anion gap: 10 (ref 5–15)
BUN: 97 mg/dL — ABNORMAL HIGH (ref 6–20)
CO2: 25 mmol/L (ref 22–32)
Calcium: 8.5 mg/dL — ABNORMAL LOW (ref 8.9–10.3)
Chloride: 100 mmol/L (ref 98–111)
Creatinine, Ser: 6.31 mg/dL — ABNORMAL HIGH (ref 0.61–1.24)
GFR, Estimated: 10 mL/min — ABNORMAL LOW (ref >60)
Glucose, Bld: 114 mg/dL — ABNORMAL HIGH (ref 70–99)
Phosphorus: 5.8 mg/dL — ABNORMAL HIGH (ref 2.5–4.6)
Potassium: 4.4 mmol/L (ref 3.5–5.1)
Sodium: 135 mmol/L (ref 135–145)

## 2021-07-22 LAB — HEPATITIS B SURFACE ANTIGEN: Hepatitis B Surface Ag: NONREACTIVE

## 2021-07-22 LAB — HEPATITIS B SURFACE ANTIBODY,QUALITATIVE: Hep B S Ab: NONREACTIVE

## 2021-07-22 SURGERY — ARTERIOVENOUS (AV) FISTULA CREATION
Anesthesia: General | Laterality: Right

## 2021-07-22 MED ORDER — EPHEDRINE 5 MG/ML INJ
INTRAVENOUS | Status: AC
  Filled 2021-07-22: qty 5

## 2021-07-22 MED ORDER — DEXAMETHASONE SODIUM PHOSPHATE 10 MG/ML IJ SOLN
INTRAMUSCULAR | Status: DC | PRN
  Administered 2021-07-22: 5 mg via INTRAVENOUS

## 2021-07-22 MED ORDER — HEPARIN 6000 UNIT IRRIGATION SOLUTION
Status: AC
  Filled 2021-07-22: qty 500

## 2021-07-22 MED ORDER — FENTANYL CITRATE (PF) 250 MCG/5ML IJ SOLN
INTRAMUSCULAR | Status: DC | PRN
  Administered 2021-07-22 (×8): 25 ug via INTRAVENOUS

## 2021-07-22 MED ORDER — FUROSEMIDE 10 MG/ML IJ SOLN
160.0000 mg | Freq: Three times a day (TID) | INTRAVENOUS | Status: DC
  Administered 2021-07-22 – 2021-07-24 (×5): 160 mg via INTRAVENOUS
  Filled 2021-07-22 (×3): qty 16
  Filled 2021-07-22: qty 14
  Filled 2021-07-22 (×2): qty 16
  Filled 2021-07-22: qty 4
  Filled 2021-07-22: qty 10

## 2021-07-22 MED ORDER — METOLAZONE 5 MG PO TABS
5.0000 mg | ORAL_TABLET | Freq: Every day | ORAL | Status: DC
  Administered 2021-07-22 – 2021-07-23 (×2): 5 mg via ORAL
  Filled 2021-07-22 (×2): qty 1

## 2021-07-22 MED ORDER — PROPOFOL 10 MG/ML IV BOLUS
INTRAVENOUS | Status: DC | PRN
  Administered 2021-07-22: 200 mg via INTRAVENOUS

## 2021-07-22 MED ORDER — ACETAMINOPHEN 10 MG/ML IV SOLN: 1000.0000 mg | Freq: Once | INTRAVENOUS | Status: DC | PRN

## 2021-07-22 MED ORDER — HEPARIN 6000 UNIT IRRIGATION SOLUTION
Status: DC | PRN
  Administered 2021-07-22: 1

## 2021-07-22 MED ORDER — PROMETHAZINE HCL 25 MG/ML IJ SOLN: 6.2500 mg | INTRAMUSCULAR | Status: DC | PRN

## 2021-07-22 MED ORDER — ALTEPLASE 2 MG IJ SOLR: 2.0000 mg | Freq: Once | INTRAMUSCULAR | Status: DC | PRN

## 2021-07-22 MED ORDER — ONDANSETRON HCL 4 MG/2ML IJ SOLN
INTRAMUSCULAR | Status: AC
  Filled 2021-07-22: qty 2

## 2021-07-22 MED ORDER — FENTANYL CITRATE (PF) 250 MCG/5ML IJ SOLN
INTRAMUSCULAR | Status: AC
  Filled 2021-07-22: qty 5

## 2021-07-22 MED ORDER — 0.9 % SODIUM CHLORIDE (POUR BTL) OPTIME
TOPICAL | Status: DC | PRN
  Administered 2021-07-22: 10:00:00 1000 mL

## 2021-07-22 MED ORDER — SODIUM CHLORIDE 0.9 % IV SOLN: INTRAVENOUS | Status: DC

## 2021-07-22 MED ORDER — FENTANYL CITRATE (PF) 100 MCG/2ML IJ SOLN
INTRAMUSCULAR | Status: AC
  Filled 2021-07-22: qty 2

## 2021-07-22 MED ORDER — ACETAMINOPHEN 325 MG PO TABS: 325.0000 mg | ORAL_TABLET | ORAL | Status: DC | PRN

## 2021-07-22 MED ORDER — PHENYLEPHRINE HCL-NACL 20-0.9 MG/250ML-% IV SOLN
INTRAVENOUS | Status: DC | PRN
  Administered 2021-07-22: 30 ug/min via INTRAVENOUS

## 2021-07-22 MED ORDER — HEPARIN SODIUM (PORCINE) 1000 UNIT/ML IJ SOLN
INTRAMUSCULAR | Status: AC
  Filled 2021-07-22: qty 10

## 2021-07-22 MED ORDER — OXYCODONE-ACETAMINOPHEN 5-325 MG PO TABS
1.0000 | ORAL_TABLET | ORAL | Status: DC | PRN
Start: 2021-07-22 — End: 2021-07-23
  Administered 2021-07-22: 15:00:00 1 via ORAL
  Filled 2021-07-22: qty 1

## 2021-07-22 MED ORDER — LIDOCAINE-EPINEPHRINE (PF) 1 %-1:200000 IJ SOLN
INTRAMUSCULAR | Status: DC | PRN
  Administered 2021-07-22: 5 mL

## 2021-07-22 MED ORDER — DEXAMETHASONE SODIUM PHOSPHATE 10 MG/ML IJ SOLN
INTRAMUSCULAR | Status: AC
  Filled 2021-07-22: qty 1

## 2021-07-22 MED ORDER — ACETAMINOPHEN 160 MG/5ML PO SOLN: 325.0000 mg | ORAL | Status: DC | PRN

## 2021-07-22 MED ORDER — CEFAZOLIN SODIUM-DEXTROSE 2-4 GM/100ML-% IV SOLN
2.0000 g | Freq: Once | INTRAVENOUS | Status: AC
  Administered 2021-07-22: 10:00:00 2 g via INTRAVENOUS

## 2021-07-22 MED ORDER — CEFAZOLIN SODIUM-DEXTROSE 2-4 GM/100ML-% IV SOLN
INTRAVENOUS | Status: AC
  Filled 2021-07-22: qty 100

## 2021-07-22 MED ORDER — PROPOFOL 10 MG/ML IV BOLUS
INTRAVENOUS | Status: AC
  Filled 2021-07-22: qty 20

## 2021-07-22 MED ORDER — PHENYLEPHRINE 40 MCG/ML (10ML) SYRINGE FOR IV PUSH (FOR BLOOD PRESSURE SUPPORT)
PREFILLED_SYRINGE | INTRAVENOUS | Status: DC | PRN
  Administered 2021-07-22: 120 ug via INTRAVENOUS

## 2021-07-22 MED ORDER — HEPARIN SODIUM (PORCINE) 1000 UNIT/ML IJ SOLN
INTRAMUSCULAR | Status: DC | PRN
  Administered 2021-07-22: 3800 [IU] via INTRAVENOUS

## 2021-07-22 MED ORDER — LIDOCAINE-EPINEPHRINE (PF) 1 %-1:200000 IJ SOLN
INTRAMUSCULAR | Status: AC
  Filled 2021-07-22: qty 30

## 2021-07-22 MED ORDER — SODIUM CHLORIDE 0.9 % IV SOLN: 100.0000 mL | INTRAVENOUS | Status: DC | PRN

## 2021-07-22 MED ORDER — PROPOFOL 1000 MG/100ML IV EMUL
INTRAVENOUS | Status: AC
  Filled 2021-07-22: qty 100

## 2021-07-22 MED ORDER — EPHEDRINE SULFATE-NACL 50-0.9 MG/10ML-% IV SOSY
PREFILLED_SYRINGE | INTRAVENOUS | Status: DC | PRN
  Administered 2021-07-22: 5 mg via INTRAVENOUS
  Administered 2021-07-22: 10 mg via INTRAVENOUS

## 2021-07-22 MED ORDER — CHLORHEXIDINE GLUCONATE CLOTH 2 % EX PADS
6.0000 | MEDICATED_PAD | Freq: Once | CUTANEOUS | Status: AC
  Administered 2021-07-22: 07:00:00 6 via TOPICAL

## 2021-07-22 MED ORDER — FENTANYL CITRATE (PF) 100 MCG/2ML IJ SOLN
25.0000 ug | INTRAMUSCULAR | Status: DC | PRN
  Administered 2021-07-22: 12:00:00 25 ug via INTRAVENOUS

## 2021-07-22 MED ORDER — LIDOCAINE 2% (20 MG/ML) 5 ML SYRINGE
INTRAMUSCULAR | Status: DC | PRN
  Administered 2021-07-22: 40 mg via INTRAVENOUS

## 2021-07-22 MED ORDER — HEPARIN SODIUM (PORCINE) 1000 UNIT/ML DIALYSIS: 1000.0000 [IU] | INTRAMUSCULAR | Status: DC | PRN

## 2021-07-22 MED ORDER — PENTAFLUOROPROP-TETRAFLUOROETH EX AERO: 1.0000 "application " | INHALATION_SPRAY | CUTANEOUS | Status: DC | PRN

## 2021-07-22 MED ORDER — CHLORHEXIDINE GLUCONATE 0.12 % MT SOLN
OROMUCOSAL | Status: AC
  Administered 2021-07-22: 09:00:00 15 mL via OROMUCOSAL
  Filled 2021-07-22: qty 15

## 2021-07-22 MED ORDER — ORAL CARE MOUTH RINSE: 15.0000 mL | Freq: Once | OROMUCOSAL | Status: AC

## 2021-07-22 MED ORDER — CHLORHEXIDINE GLUCONATE 0.12 % MT SOLN: 15.0000 mL | Freq: Once | OROMUCOSAL | Status: AC

## 2021-07-22 MED ORDER — CHLORHEXIDINE GLUCONATE CLOTH 2 % EX PADS
6.0000 | MEDICATED_PAD | Freq: Every day | CUTANEOUS | Status: DC
  Administered 2021-07-22 – 2021-08-01 (×11): 6 via TOPICAL

## 2021-07-22 MED ORDER — PHENYLEPHRINE 40 MCG/ML (10ML) SYRINGE FOR IV PUSH (FOR BLOOD PRESSURE SUPPORT)
PREFILLED_SYRINGE | INTRAVENOUS | Status: AC
  Filled 2021-07-22: qty 10

## 2021-07-22 MED ORDER — ONDANSETRON HCL 4 MG/2ML IJ SOLN
INTRAMUSCULAR | Status: DC | PRN
  Administered 2021-07-22: 4 mg via INTRAVENOUS

## 2021-07-22 SURGICAL SUPPLY — 45 items
ARMBAND PINK RESTRICT EXTREMIT (MISCELLANEOUS) ×6 IMPLANT
BAG COUNTER SPONGE SURGICOUNT (BAG) ×3 IMPLANT
BIOPATCH RED 1 DISK 7.0 (GAUZE/BANDAGES/DRESSINGS) ×1 IMPLANT
CANISTER SUCT 3000ML PPV (MISCELLANEOUS) ×3 IMPLANT
CATH PALINDROME-P 23CM W/VT (CATHETERS) ×1 IMPLANT
CLIP TI MEDIUM 6 (CLIP) ×1 IMPLANT
CLIP TI WIDE RED SMALL 6 (CLIP) ×1 IMPLANT
CLIP VESOCCLUDE MED 6/CT (CLIP) ×3 IMPLANT
CLIP VESOCCLUDE SM WIDE 6/CT (CLIP) ×3 IMPLANT
COVER PROBE W GEL 5X96 (DRAPES) ×4 IMPLANT
DERMABOND ADVANCED (GAUZE/BANDAGES/DRESSINGS) ×1
DERMABOND ADVANCED .7 DNX12 (GAUZE/BANDAGES/DRESSINGS) ×2 IMPLANT
DRAPE ORTHO SPLIT 77X108 STRL (DRAPES) ×6
DRAPE SURG ORHT 6 SPLT 77X108 (DRAPES) IMPLANT
ELECT REM PT RETURN 9FT ADLT (ELECTROSURGICAL) ×3
ELECTRODE REM PT RTRN 9FT ADLT (ELECTROSURGICAL) ×2 IMPLANT
GLOVE SRG 8 PF TXTR STRL LF DI (GLOVE) ×2 IMPLANT
GLOVE SURG GAMMEX LF SZ7 (GLOVE) ×1 IMPLANT
GLOVE SURG POLYISO LF SZ6.5 (GLOVE) ×1 IMPLANT
GLOVE SURG POLYISO LF SZ7.5 (GLOVE) ×4 IMPLANT
GLOVE SURG UNDER POLY LF SZ8 (GLOVE) ×3
GOWN STRL REUS W/ TWL LRG LVL3 (GOWN DISPOSABLE) ×4 IMPLANT
GOWN STRL REUS W/ TWL XL LVL3 (GOWN DISPOSABLE) ×2 IMPLANT
GOWN STRL REUS W/TWL LRG LVL3 (GOWN DISPOSABLE) ×6
GOWN STRL REUS W/TWL XL LVL3 (GOWN DISPOSABLE) ×3
HEMOSTAT SNOW SURGICEL 2X4 (HEMOSTASIS) IMPLANT
KIT BASIN OR (CUSTOM PROCEDURE TRAY) ×3 IMPLANT
KIT TURNOVER KIT B (KITS) ×3 IMPLANT
NS IRRIG 1000ML POUR BTL (IV SOLUTION) ×3 IMPLANT
PACK CV ACCESS (CUSTOM PROCEDURE TRAY) ×3 IMPLANT
PAD ARMBOARD 7.5X6 YLW CONV (MISCELLANEOUS) ×6 IMPLANT
SOL PREP POV-IOD 4OZ 10% (MISCELLANEOUS) ×1 IMPLANT
SOL SCRUB PVP POV-IOD 4OZ 7.5% (MISCELLANEOUS) ×3
SOLUTION SCRB POV-IOD 4OZ 7.5% (MISCELLANEOUS) IMPLANT
SUT ETHILON 3 0 PS 1 (SUTURE) ×1 IMPLANT
SUT PROLENE 6 0 CC (SUTURE) ×3 IMPLANT
SUT VIC AB 3-0 SH 27 (SUTURE) ×3
SUT VIC AB 3-0 SH 27X BRD (SUTURE) ×2 IMPLANT
SUT VIC AB 4-0 PS2 18 (SUTURE) ×1 IMPLANT
SUT VIC AB 4-0 PS2 27 (SUTURE) ×1 IMPLANT
SUT VICRYL 4-0 PS2 18IN ABS (SUTURE) ×3 IMPLANT
SYR 20ML ECCENTRIC (SYRINGE) ×1 IMPLANT
TOWEL GREEN STERILE (TOWEL DISPOSABLE) ×3 IMPLANT
UNDERPAD 30X36 HEAVY ABSORB (UNDERPADS AND DIAPERS) ×3 IMPLANT
WATER STERILE IRR 1000ML POUR (IV SOLUTION) ×3 IMPLANT

## 2021-07-22 NOTE — Interval H&P Note (Signed)
History and Physical Interval Note:  07/22/2021 8:37 AM  [name]  has presented today for surgery, with the diagnosis of END STAGE RENAL DISEASE.  The various methods of treatment have been discussed with the patient and family. After consideration of risks, benefits and other options for treatment, the patient has consented to  Procedure(s): RIGHT  ARM FISTULA VERSUS GRAFT CREATION (Right) INSERTION OF DIALYSIS CATHETER as a surgical intervention.  The patient's history has been reviewed, patient examined, no change in status, stable for surgery.  I have reviewed the patient's chart and labs.  Questions were answered to the patient's satisfaction.     [name]

## 2021-07-22 NOTE — Anesthesia Preprocedure Evaluation (Signed)
Anesthesia Evaluation  Patient identified by MRN, date of birth, ID band Patient awake    Reviewed: Allergy & Precautions, NPO status , Patient's Chart, lab work & pertinent test results  Airway Mallampati: III  TM Distance: >3 FB Neck ROM: Full    Dental  (+) Teeth Intact, Dental Advisory Given   Pulmonary    breath sounds clear to auscultation       Cardiovascular hypertension, Pt. on medications and Pt. on home beta blockers +CHF   Rhythm:Regular Rate:Normal     Neuro/Psych negative neurological ROS  negative psych ROS   GI/Hepatic negative GI ROS, Neg liver ROS,   Endo/Other  diabetes, Type 2, Oral Hypoglycemic Agents  Renal/GU Renal disease     Musculoskeletal negative musculoskeletal ROS (+)   Abdominal Normal abdominal exam  (+)   Peds  Hematology negative hematology ROS (+)   Anesthesia Other Findings   Reproductive/Obstetrics                             Anesthesia Physical Anesthesia Plan  ASA: 3  Anesthesia Plan: General   Post-op Pain Management:    Induction: Intravenous  PONV Risk Score and Plan: 3 and Ondansetron, Dexamethasone and Midazolam  Airway Management Planned: LMA  Additional Equipment: None  Intra-op Plan:   Post-operative Plan: Extubation in OR  Informed Consent: I have reviewed the patients History and Physical, chart, labs and discussed the procedure including the risks, benefits and alternatives for the proposed anesthesia with the patient or authorized representative who has indicated his/her understanding and acceptance.     Dental advisory given  Plan Discussed with: CRNA  Anesthesia Plan Comments:         Anesthesia Quick Evaluation

## 2021-07-22 NOTE — Progress Notes (Signed)
OT Cancellation Note  Patient Details Name: [name] MRN: [id number] DOB: March 12, 1966   Cancelled Treatment:    Reason Eval/Treat Not Completed: Other (comment) (Pt sore and very fatigued from AM procedure. Will follow up as schedule allows)  [name], OTD, OTR/L Acute Rehab [phone]   [name] 07/22/2021, 2:10 PM

## 2021-07-22 NOTE — Anesthesia Postprocedure Evaluation (Signed)
Anesthesia Post Note  Patient: [name]  Procedure(s) Performed: RIGHT  ARM FISTULA (Right) INSERTION OF DIALYSIS CATHETER     Patient location during evaluation: PACU Anesthesia Type: General Level of consciousness: awake and alert Pain management: pain level controlled Vital Signs Assessment: post-procedure vital signs reviewed and stable Respiratory status: spontaneous breathing, nonlabored ventilation, respiratory function stable and patient connected to nasal cannula oxygen Cardiovascular status: blood pressure returned to baseline and stable Postop Assessment: no apparent nausea or vomiting Anesthetic complications: no   No notable events documented.  Last Vitals:  Vitals:   07/22/21 1224 07/22/21 1239  BP: (!) 164/87 (!) 169/84  Pulse:    Resp:    Temp:    SpO2:      Last Pain:  Vitals:   07/22/21 1209  TempSrc: (P) Oral  PainSc:                  Effie Berkshire

## 2021-07-22 NOTE — Transfer of Care (Signed)
Immediate Anesthesia Transfer of Care Note  Patient: [name]  Procedure(s) Performed: RIGHT  ARM FISTULA (Right) INSERTION OF DIALYSIS CATHETER  Patient Location: PACU  Anesthesia Type:General  Level of Consciousness: drowsy  Airway & Oxygen Therapy: Patient Spontanous Breathing and Patient connected to face mask oxygen  Post-op Assessment: Report given to RN and Post -op Vital signs reviewed and stable  Post vital signs: Reviewed and stable  Last Vitals:  Vitals Value Taken Time  BP 149/82 07/22/21 1109  Temp    Pulse 74 07/22/21 1109  Resp 19 07/22/21 1109  SpO2 99 % 07/22/21 1109  Vitals shown include unvalidated device data.  Last Pain:  Vitals:   07/22/21 0835  TempSrc:   PainSc: 4       Patients Stated Pain Goal: 0 (28/36/62 9476)  Complications: No notable events documented.

## 2021-07-22 NOTE — Anesthesia Procedure Notes (Addendum)
Procedure Name: LMA Insertion Date/Time: 07/22/2021 9:38 AM Performed by: Erick Colace, CRNA Patient Re-evaluated:Patient Re-evaluated prior to induction Oxygen Delivery Method: Circle system utilized, Simple face mask, Non-rebreather mask, Nasal cannula and Ambu bag Preoxygenation: Pre-oxygenation with 100% oxygen Induction Type: IV induction Ventilation: Mask ventilation without difficulty and Oral airway inserted - appropriate to patient size LMA: LMA inserted LMA Size: 5.0 Number of attempts: 1 Placement Confirmation: positive ETCO2 and breath sounds checked- equal and bilateral Tube secured with: Tape Dental Injury: Teeth and Oropharynx as per pre-operative assessment

## 2021-07-22 NOTE — Progress Notes (Signed)
[facility] PROGRESS NOTE    Progress Note  [name]  EXN:[id number] DOB: 1966/07/06 DOA: 07/15/2021 PCP: [name], MD     Brief Narrative:   [name] is an 55 y.o. male history of chronic diastolic heart failure, chronic kidney disease stage IV, referred to the ED on 07/16/2019 to from the cardiology clinic due to volume overload.  Chest x-ray confirmed pulmonary congestion with bilateral pleural effusion BNP of 600 creatinine at baseline of 4 was started on IV Lasix and metolazone nephrology was consulted   Assessment/Plan:   Acute on chronic diastolic CHF (congestive heart failure) (HCC) Creatinine continues to rise.  Further management per advanced heart failure team and nephrology. Cardiology concerned about amyloid involvement PYP scan to be scheduled, myeloma panel is pending  Acute respiratory failure with hypoxia due to pulmonary edema and heart failure: Has been weaned from oxygen therapy.  Chronic kidney disease stage IV: Follows with Dr. Theador Hawthorne as an outpatient. Seems to be getting uremic. Nephrology consulted vascular surgery for vascular access which will be done on 07/22/2021. Further management per renal expect the patient to be started on dialysis soon.  Chronic lower extremity wound: No evidence of infection, continue Unna boot therapy.  Essential hypertension: Continue labetalol, hydralazine holding ACE inhibitor as he is no longer a candidate.  Hyperkalemia intermittent: Renal diet has been changed and no longer a candidate for ACE inhibitor or ARB, Entresto or Aldactone. Metolazone and Lasix helping. Now resolved.  Diabetes mellitus type 2: With an A1c of 7.1, blood glucose fairly controlled  Anemia of chronic renal disease: No signs of bleeding hemoglobin at 9.  Morbid obesity: He has been counseled.   DVT prophylaxis: lovenox Family Communication:none Status is: Inpatient  Remains inpatient appropriate because:  Acute diastolic heart failure     Code Status:     Code Status Orders  (From admission, onward)           Start     Ordered   07/15/21 2205  Full code  Continuous        07/15/21 2205           Code Status History     Date Active Date Inactive Code Status Order ID Comments User Context   04/16/2021 1733 04/23/2021 1447 Full Code [id number]  Orson Eva, MD ED   09/27/2020 2033 10/01/2020 1804 Full Code [id number]  Bethena Roys, MD Inpatient   06/14/2020 2046 06/21/2020 1833 Full Code [id number]  Zierle-Ghosh, [place] B, DO Inpatient   05/03/2020 1805 05/07/2020 1900 Full Code [id number]  Roney Jaffe, MD ED   04/05/2020 1852 04/10/2020 2115 Full Code [id number]  Oswald Hillock, MD ED   07/21/2011 1126 07/24/2011 1439 Full Code [id number]  Pollie Meyer, LPN Inpatient         IV Access:   Peripheral IV   Procedures and diagnostic studies:   NM CARDIAC AMYLOID TUMOR LOC INFLAM SPECT 1 DAY  Result Date: 07/20/2021 CLINICAL DATA:  HEART FAILURE. CONCERN FOR CARDIAC AMYLOIDOSIS. EXAM: NUCLEAR MEDICINE TUMOR LOCALIZATION. PYP CARDIAC AMYLOIDOSIS SCAN WITH SPECT TECHNIQUE: Following intravenous administration of radiopharmaceutical, anterior planar images of the chest were obtained. Regions of interest were placed on the heart and contralateral chest wall for quantitative assessment. Additional SPECT imaging of the chest was obtained. RADIOPHARMACEUTICALS:  20.7 mCi TECHNETIUM 99 PYROPHOSPHATE FINDINGS: Planar Visual assessment: Anterior planar imaging demonstrates radiotracer uptake within the heart greater than uptake within the adjacent ribs (Grade 2). Quantitative assessment : Quantitative  assessment of the cardiac uptake compared to the contralateral chest wall is equal to (H/CL = 1.18). SPECT assessment: SPECT imaging of the chest demonstrates mild radiotracer accumulation within the LEFT ventricle. IMPRESSION: Visual and quantitative assessment (grade 2, H/CLL equal 1.18)  are strongly suggestive of transthyretin amyloidosis. Electronically Signed   By: Kerby Moors M.D.   On: 07/20/2021 16:14   VAS [place] UPPER EXT VEIN MAPPING (PRE-OP AVF)  Result Date: 07/21/2021 UPPER EXTREMITY VEIN MAPPING Patient Name:  [name]  Date of Exam:   07/21/2021 Medical Rec #: [id number]        Accession #:    [id number] Date of Birth: 1966-01-13        Patient Gender: M Patient Age:   23 years Exam Location:  [facility] Procedure:      VAS [place] UPPER EXT VEIN MAPPING (PRE-OP AVF) Referring Phys: Dagoberto Ligas --------------------------------------------------------------------------------  Indications: Pre-access. Comparison Study: No prior studies. Performing Technologist: Oliver Hum RVT  Examination Guidelines: A complete evaluation includes B-mode imaging, spectral Doppler, color Doppler, and power Doppler as needed of all accessible portions of each vessel. Bilateral testing is considered an integral part of a complete examination. Limited examinations for reoccurring indications may be performed as noted. +-----------------+-------------+----------+---------+  Right Cephalic    Diameter (cm) Depth (cm) Findings   +-----------------+-------------+----------+---------+  Shoulder              0.45         1.90               +-----------------+-------------+----------+---------+  Prox upper arm        0.45         0.44               +-----------------+-------------+----------+---------+  Mid upper arm         0.43         0.27               +-----------------+-------------+----------+---------+  Dist upper arm        0.57         0.35               +-----------------+-------------+----------+---------+  Antecubital fossa     0.67         0.26    branching  +-----------------+-------------+----------+---------+  Prox forearm          0.44         0.29    branching  +-----------------+-------------+----------+---------+  Mid forearm           0.41         0.23    branching   +-----------------+-------------+----------+---------+  Dist forearm          0.37         0.26               +-----------------+-------------+----------+---------+ +-----------------+-------------+----------+---------+  Left Cephalic     Diameter (cm) Depth (cm) Findings   +-----------------+-------------+----------+---------+  Shoulder              0.49         2.30               +-----------------+-------------+----------+---------+  Prox upper arm        0.37         1.50               +-----------------+-------------+----------+---------+  Mid upper arm  0.34         0.43               +-----------------+-------------+----------+---------+  Dist upper arm        0.40         0.42               +-----------------+-------------+----------+---------+  Antecubital fossa     0.50         0.32    branching  +-----------------+-------------+----------+---------+  Prox forearm          0.34         0.40    branching  +-----------------+-------------+----------+---------+  Mid forearm           0.47         0.28    branching  +-----------------+-------------+----------+---------+  Dist forearm          0.45         0.27               +-----------------+-------------+----------+---------+ *See table(s) above for measurements and observations.  Diagnosing physician: Harold Barban MD Electronically signed by Harold Barban MD on 07/21/2021 at 8:02:37 PM.    Final      Medical Consultants:   None.   Subjective:    [name] sleepy this morning postprocedural  Objective:    Vitals:   07/21/21 1945 07/22/21 0039 07/22/21 0635 07/22/21 0817  BP: (!) 156/82 (!) 142/75 119/63 (!) 176/92  Pulse: 85  84 81  Resp: 16  18 18   Temp: 97.9 F (36.6 C)  97.9 F (36.6 C) 98.2 F (36.8 C)  TempSrc: Oral  Oral Oral  SpO2: 98%  99% 95%  Weight:   124.5 kg 124.3 kg  Height:    5\' 11"  (1.803 m)   SpO2: 95 %   Intake/Output Summary (Last 24 hours) at 07/22/2021 0956 Last data filed at 07/22/2021  0998 Gross per 24 hour  Intake 1033 ml  Output 2175 ml  Net -1142 ml    Filed Weights   07/21/21 0500 07/22/21 0635 07/22/21 0817  Weight: 126.4 kg 124.5 kg 124.3 kg    Exam: General exam: In no acute distress. Respiratory system: Good air movement and clear to auscultation. Cardiovascular system: S1 & S2 heard, RRR. No JVD. Gastrointestinal system: Abdomen is nondistended, soft and nontender.  Skin: No rashes, lesions or ulcers Psychiatry: Sleepy postprocedural   Data Reviewed:    Labs: Basic Metabolic Panel: Recent Labs  Lab 07/16/21 0029 07/17/21 0258 07/18/21 0759 07/19/21 0323 07/20/21 0427 07/21/21 0136 07/22/21 0244  NA 141   < > 140 139 137 137 135  K 4.9   < > 4.9 5.2* 4.9 4.6 4.4  CL 112*   < > 106 106 102 101 100  CO2 22   < > 23 22 24 25 25   GLUCOSE 121*   < > 128* 113* 136* 128* 114*  BUN 64*   < > 73* 80* 87* 94* 97*  CREATININE 4.30*   4.31*   < > 5.25* 5.51* 5.81* 5.90* 6.31*  CALCIUM 8.2*   < > 8.3* 8.5* 8.3* 8.6* 8.5*  MG 1.9  --   --   --   --   --   --   PHOS  --   --   --   --   --  5.4* 5.8*   < > = values in this interval not displayed.    GFR Estimated Creatinine  Clearance: 17.5 mL/min (A) (by C-G formula based on SCr of 6.31 mg/dL (H)). Liver Function Tests: Recent Labs  Lab 07/15/21 1130 07/21/21 0136 07/22/21 0244  AST 17  --   --   ALT 19  --   --   ALKPHOS 78  --   --   BILITOT 0.5  --   --   PROT 6.0*  --   --   ALBUMIN 2.8* 2.8* 2.8*    No results for input(s): LIPASE, AMYLASE in the last 168 hours. No results for input(s): AMMONIA in the last 168 hours. Coagulation profile No results for input(s): INR, PROTIME in the last 168 hours. COVID-19 Labs  No results for input(s): DDIMER, FERRITIN, LDH, CRP in the last 72 hours.   Lab Results  Component Value Date   SARSCOV2NAA NEGATIVE 07/15/2021   [place] NEGATIVE 04/16/2021   [place] NEGATIVE 09/27/2020   [place] NEGATIVE 06/14/2020    CBC: Recent  Labs  Lab 07/15/21 1130 07/15/21 1259 07/16/21 0220 07/17/21 0258 07/21/21 0136  WBC 4.9 5.2 5.7 4.9 6.2  HGB 9.4* 9.5* 9.1* 8.7* 9.4*  HCT 29.9* 29.9* 27.7* 27.6* 29.2*  MCV 84.9 85.9 84.7 84.9 83.2  PLT 261 277 255 270 315    Cardiac Enzymes: No results for input(s): CKTOTAL, CKMB, CKMBINDEX, TROPONINI in the last 168 hours. BNP (last 3 results) No results for input(s): PROBNP in the last 8760 hours. CBG: Recent Labs  Lab 07/21/21 0817 07/21/21 1146 07/21/21 1758 07/21/21 2121 07/22/21 0820  GLUCAP 169* 136* 160* 125* 129*    D-Dimer: No results for input(s): DDIMER in the last 72 hours. Hgb A1c: No results for input(s): HGBA1C in the last 72 hours. Lipid Profile: No results for input(s): CHOL, HDL, LDLCALC, TRIG, CHOLHDL, LDLDIRECT in the last 72 hours. Thyroid function studies: No results for input(s): TSH, T4TOTAL, T3FREE, THYROIDAB in the last 72 hours.  Invalid input(s): FREET3 Anemia work up: No results for input(s): VITAMINB12, FOLATE, FERRITIN, TIBC, IRON, RETICCTPCT in the last 72 hours.  Sepsis Labs: Recent Labs  Lab 07/15/21 1130 07/15/21 1259 07/16/21 0220 07/17/21 0258 07/21/21 0136  WBC 4.9 5.2 5.7 4.9 6.2  LATICACIDVEN 1.4  --   --   --   --     Microbiology Recent Results (from the past 240 hour(s))  Resp Panel by RT-PCR (Flu A&B, Covid) Nasopharyngeal Swab     Status: None   Collection Time: 07/15/21  1:00 PM   Specimen: Nasopharyngeal Swab; Nasopharyngeal(NP) swabs in vial transport medium  Result Value Ref Range Status   SARS Coronavirus 2 by RT PCR NEGATIVE NEGATIVE Final    Comment: (NOTE) SARS-CoV-2 target nucleic acids are NOT DETECTED.  The SARS-CoV-2 RNA is generally detectable in upper respiratory specimens during the acute phase of infection. The lowest concentration of SARS-CoV-2 viral copies this assay can detect is 138 copies/mL. A negative result does not preclude SARS-Cov-2 infection and should not be used as the  sole basis for treatment or other patient management decisions. A negative result may occur with  improper specimen collection/handling, submission of specimen other than nasopharyngeal swab, presence of viral mutation(s) within the areas targeted by this assay, and inadequate number of viral copies(<138 copies/mL). A negative result must be combined with clinical observations, patient history, and epidemiological information. The expected result is Negative.  Fact Sheet for Patients:  EntrepreneurPulse.com.au  Fact Sheet for Healthcare Providers:  IncredibleEmployment.be  This test is no t yet approved or cleared by the [place]  FDA and  has been authorized for detection and/or diagnosis of SARS-CoV-2 by FDA under an Emergency Use Authorization (EUA). This EUA will remain  in effect (meaning this test can be used) for the duration of the COVID-19 declaration under Section 564(b)(1) of the Act, 21 U.S.C.section 360bbb-3(b)(1), unless the authorization is terminated  or revoked sooner.       Influenza A by PCR NEGATIVE NEGATIVE Final   Influenza B by PCR NEGATIVE NEGATIVE Final    Comment: (NOTE) The Xpert Xpress SARS-CoV-2/FLU/RSV plus assay is intended as an aid in the diagnosis of influenza from Nasopharyngeal swab specimens and should not be used as a sole basis for treatment. Nasal washings and aspirates are unacceptable for Xpert Xpress SARS-CoV-2/FLU/RSV testing.  Fact Sheet for Patients: EntrepreneurPulse.com.au  Fact Sheet for Healthcare Providers: IncredibleEmployment.be  This test is not yet approved or cleared by the [place] FDA and has been authorized for detection and/or diagnosis of SARS-CoV-2 by FDA under an Emergency Use Authorization (EUA). This EUA will remain in effect (meaning this test can be used) for the duration of the COVID-19 declaration under Section 564(b)(1) of the  Act, 21 U.S.C. section 360bbb-3(b)(1), unless the authorization is terminated or revoked.  Performed at [facility], [place] [address] [place], [place] [address]   Surgical pcr screen     Status: None   Collection Time: 07/21/21  9:10 PM   Specimen: Nasal Mucosa; Nasal Swab  Result Value Ref Range Status   MRSA, PCR NEGATIVE NEGATIVE Final   Staphylococcus aureus NEGATIVE NEGATIVE Final    Comment: (NOTE) The Xpert SA Assay (FDA approved for NASAL specimens in patients 67 years of age and older), is one component of a comprehensive surveillance program. It is not intended to diagnose infection nor to guide or monitor treatment. Performed at [facility], [place] [address], [place], [place] [address]      Medications:    [MAR Hold] darbepoetin (ARANESP) injection - NON-DIALYSIS  100 mcg Subcutaneous Q Mon-1800   [MAR Hold] heparin  5,000 Units Subcutaneous Q8H   [MAR Hold] hydrALAZINE  100 mg Oral TID   [MAR Hold] insulin aspart  0-6 Units Subcutaneous TID WC   [MAR Hold] labetalol  600 mg Oral BID   [MAR Hold] metolazone  5 mg Oral Daily   [MAR Hold] pravastatin  40 mg Oral Daily   Continuous Infusions:  sodium chloride 10 mL/hr at 07/22/21 0928    ceFAZolin (ANCEF) IV     [MAR Hold] ferric gluconate (FERRLECIT) IVPB Stopped (07/21/21 1113)   [MAR Hold] furosemide 66 mL/hr at 07/22/21 0334      LOS: 7 days   Charlynne Cousins  [facility]  07/22/2021, 9:56 AM

## 2021-07-22 NOTE — Progress Notes (Signed)
Mobility Specialist Progress Note    07/22/21 1528  Mobility  Activity Refused mobility   Pt c/o soreness.   [facility] Mobility Specialist  M.S. Primary Phone: [phone] M.S. Secondary Phone: [phone]

## 2021-07-22 NOTE — Progress Notes (Signed)
[facility] Progress Note  Name: [name] MRN: [id number] DOB: 1965-08-03   Subjective:  Patient underwent surgery this morning with [facility] placement and AVF creation.  Tired but otherwise feels well.  Creatinine slightly worse at 6.3 and urine output lower despite Lasix.  --------------- Background on consult:  STONEWALL DOSS is a 55 y.o. 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  Note that on 06/24/2021 his metolazone was increased to 5 mg 5 days a week given persistent overload at that time per charting.  When he was seen in heart failure follow-up clinic on 12/23 his weight was noted to be up more than 20 pounds and he was directed to the hospital for IV diuretics.  Dr. Haroldine Laws recommended Lasix 160 mg IV every 6 hours plus metolazone 5 mg daily with the knowledge that he may need to start dialysis if he does not respond.  Cardiology is also planning to rule out amyloid as well.  Creatinine trends as below.  700 and mils of urine recorded on 12/23 and 1.3 L as well as one unmeasured urine void reported today.  He follows with Dr. Theador Hawthorne.  Per nephrology notes his metolazone was increased to 5 mg three times a week at the 06/08/21 office appointment.  He does think that his thighs don't feel tight since yesterday.  He would want dialysis if it were needed - he thought was going to have to start when GFR was 12 but it got better.  He does not have access. Short of breath after ambulating to restroom - just had diarrhea.    Intake/Output Summary (Last 24 hours) at 07/22/2021 1346 Last data filed at 07/22/2021 1051 Gross per 24 hour  Intake 1073 ml  Output 1410 ml  Net -337 ml    Vitals:  Vitals:   07/22/21 1155 07/22/21 1209 07/22/21 1224 07/22/21 1239  BP: (!) 174/90 (!) 176/89 (!) 164/87 (!) 169/84   Pulse: 75     Resp: 12 (P) 15    Temp: 97.8 F (36.6 C) (!) (P) 97.4 F (36.3 C)    TempSrc:  (P) Oral    SpO2: 98%     Weight:      Height:         Physical Exam:   General: lying in bed, nad HEENT: NCAT Eyes: EOMI sclera anicteric Heart: normal rate Lungs: Bilateral chest rise, no increased work of breathing Abdomen: softly distended /nt [name] habitus Extremities: 2+ pitting edema bilateral lower extremities Skin: chronic venous stasis changes Neuro: alert and oriented x 3 provides hx and follows commands Pysch normal mood and affect GU no foley  Medications reviewed   Labs:  BMP Latest Ref Rng & Units 07/22/2021 07/21/2021 07/20/2021  Glucose 70 - 99 mg/dL 114(H) 128(H) 136(H)  BUN 6 - 20 mg/dL 97(H) 94(H) 87(H)  Creatinine 0.61 - 1.24 mg/dL 6.31(H) 5.90(H) 5.81(H)  BUN/Creat Ratio 9 - 20 - - -  Sodium 135 - 145 mmol/L 135 137 137  Potassium 3.5 - 5.1 mmol/L 4.4 4.6 4.9  Chloride 98 - 111 mmol/L 100 101 102  CO2 22 - 32 mmol/L 25 25 24   Calcium 8.9 - 10.3 mg/dL 8.5(L) 8.6(L) 8.3(L)     Assessment/Plan:   # Acute on chronic diastolic CHF  - Continue lasix 160 mg IV every 8 hours and  metolazone daily -> likely can decrease diuretic use once patient established on HD - note weight 139.7 kg on 12/23 for reference-  is down to 124 but remains volume overloaded   # AKI on CKD stage IV  - Cr was 3.74 with GFR 18 in November 2022 and in September GFR below 15 - holding home lisinopril and amlodipine - changed to renal DM diet with fluid restriction 1.2 liters/day - He follows with an outside practice, Dr. Theador Hawthorne [facility] -  needs access-patient has been hesitant to undergo dialysis and receive access placement.  Now agreed and s/p AVF creation and TDC placement on 12/30; appreciate help from [facility] - Likely ESRD at this point given severe CKD - Plan to initiate HD on 12/31 and start CLIP process next week    # HTN  - optimize volume  status with diuretics as above-   is still needing continued IV diuretics  - consider restarting norvasc if BP remains high; wait until after HD   # OSA - note decreased respiratory reserve   # DM type 2 - per primary team   - Anemia-  iron stores just a little low, will replete and have added ESA -  Hgb 9.4  Hyperkalemia-  resolved     Reesa Chew, MD 07/22/2021 1:46 PM

## 2021-07-22 NOTE — Op Note (Signed)
Patient name: [name] MRN: [id number] DOB: 03/28/1966 Sex: male  07/18/2021 - 07/22/2021 Pre-operative Diagnosis: End-stage renal disease Post-operative diagnosis:  Same Surgeon:  [name] Assistants: Arlee Muslim Procedure:   #1: Ultrasound-guided placement of a right internal jugular vein tunneled dialysis catheter with fluoroscopic imaging                       #2: Right radiocephalic fistula Anesthesia: General Blood Loss: Minimal Specimens: None   Findings: Cephalic vein at the wrist measured about 3 mm.  The radial artery was disease-free and 2.5 mm   Indications: This is a 55 year old gentleman in need of dialysis access.  He was consented for catheter and fistula   Procedure:  The patient was identified in the holding area and taken to [place] [address]  The patient was then placed supine on the table. general anesthesia was administered.  The patient was prepped and draped in the usual sterile fashion.  A time out was called and antibiotics were administered.  A PA was necessary to explain procedure and assist with technical details.  The right internal jugular vein was evaluated with ultrasound and found be widely patent and easily compressible.  The balloon was used to make a skin nick.  The right internal jugular vein was then cannulated ultrasound guidance with an 18-gauge needle.  An 035 wire was then inserted into the inferior vena cava under fluoroscopic visualization.  Subcutaneous tract was dilated with sequential dilators and the peel-away sheath was placed.  Next a 23 cm catheter was selected.  A skin exit site was selected below the clavicle.  A stab incision was performed and then a tunnel was created between the 2 incisions.  The catheter was then inserted into the peel-away sheath which was removed.  Catheter tip was in the superior vena cava.  There were no kinks within the catheter.  Both ports flush and aspirate without difficulty.  Catheter sutured into  position with 3-0 nylon.  The neck incision was closed with 4-0 Vicryl and Dermabond.  Next attention was turned towards the right arm.  Ultrasound was used to evaluate the cephalic vein in the arm.  This appeared to be a excellent vein measuring at least 3 mm throughout.  I therefore elected to proceed with a radiocephalic fistula.  A longitudinal incision was made just above the wrist.  I first dissected out the radial artery which is a 2.5 mm disease-free artery which was encircled with Vesseloops.  I then exposed the cephalic vein.  This was a 3 mm vein.  It was fully mobilized and marked for orientation and then ligated distally.  Next the radial artery was occluded.  A #11 blade was used to make an arteriotomy which was extended longitudinally with Potts scissors.  The vein was cut the appropriate length and spatulated to fit the size of the arteriotomy.  A running anastomosis was created with 6-0 Prolene.  Prior to completion,  The appropriate flushing maneuvers were performed and the anastomosis was completed.  The clamps were then released.  There was excellent flow from the fistula and brisk Doppler signal distal to the anastomosis.  Was then irrigated.  Once hemostasis was satisfactory, incision was closed with 2 layers of Vicryl followed by Dermabond.  And immediate complications.  To PACU stable.     Disposition: To PACU stable     V. [name], M.D., FACS [facility] Office:  [phone] Pager:  [phone]

## 2021-07-22 NOTE — TOC Initial Note (Signed)
Transition of Care [facility]) - Initial/Assessment Note    Patient Details  Name: [name] MRN: [id number] Date of Birth: November 01, 1965  Transition of [facility]) CM/SW Contact:    Bethena Roys, RN Phone Number: 07/22/2021, 3:43 PM  Clinical Narrative:  Case Manager spoke with patient and spouse regarding home health needs. Patient currently has unna boots on and would benefit from [facility] RN for disease management and unna boots. Recommendations for [facility] PT as well. Patient and spouse did not have a preference for agency. Case Manager called several agencies listed below under permission sought granted; however, most companies cannot accept the insurance or are not in network. Case Manager did call [facility] and they are reviewing the insurance to see if they can accept the patient. Case Manager awaiting to hear back from [facility].              Expected Discharge Plan: [place] Barriers to Discharge: Continued Medical Work up   Patient Goals and CMS Choice     Choice offered to / list presented to : Spouse  Expected Discharge Plan and Services Expected Discharge Plan: [place] In-house Referral: NA Discharge Planning Services: CM Consult Post Acute Care Choice: [place] arrangements for the past 2 months: Single Family Home                      HH Arranged: RN, PT, Disease Management     Prior Living Arrangements/Services Living arrangements for the past 2 months: Single Family Home Lives with:: Spouse Patient language and need for interpreter reviewed:: Yes Do you feel safe going back to the place where you live?: Yes      Need for Family Participation in Patient Care: Yes (Comment) Care giver support system in place?: Yes (comment)   Criminal Activity/Legal Involvement Pertinent to Current Situation/Hospitalization: No - Comment as needed  Activities of Daily Living Home Assistive Devices/Equipment: None ADL  Screening (condition at time of admission) Patient's cognitive ability adequate to safely complete daily activities?: Yes Is the patient deaf or have difficulty hearing?: No Does the patient have difficulty seeing, even when wearing glasses/contacts?: No Does the patient have difficulty concentrating, remembering, or making decisions?: No Patient able to express need for assistance with ADLs?: Yes Does the patient have difficulty dressing or bathing?: No Independently performs ADLs?: Yes (appropriate for developmental age) Does the patient have difficulty walking or climbing stairs?: Yes Weakness of Legs: None Weakness of Arms/Hands: None  Permission Sought/Granted Permission sought to share information with : Case Manager, Family Supports, Chartered certified accountant granted to share information with : Yes, Verbal Permission Granted     Permission granted to share info w AGENCY: [facility]- unable to accept [place], [facility]- no longer partners with [place], [facility]- can no longer accept the insurance, [facility]- not in network, [facility]-not in network, [place] not a partner. Awaiting [facility].        Emotional Assessment Appearance:: Appears stated age Attitude/Demeanor/Rapport: Engaged Affect (typically observed): Appropriate Orientation: : Oriented to Situation, Oriented to  Time, Oriented to Place, Oriented to Self Alcohol / Substance Use: Not Applicable Psych Involvement: No (comment)  Admission diagnosis:  Acute exacerbation of CHF (congestive heart failure) (HCC) [I50.9] Acute on chronic diastolic CHF (congestive heart failure) (HCC) [I50.33] Acute on chronic heart failure, unspecified heart failure type ([place]) [I50.9] Respiratory failure, unspecified chronicity, unspecified whether with hypoxia or hypercapnia ([place]) [J96.90] Patient Active  Problem List   Diagnosis Date Noted   Acute exacerbation of CHF (congestive heart failure) ([place]) 07/15/2021   Class 2  obesity due to excess calories with body mass index (BMI) of 38.0 to 38.9 in adult    AKI (acute kidney injury) ([place]) 04/18/2021   CKD (chronic kidney disease) stage 4, GFR 15-29 ml/min ([place]) 04/16/2021   Acute respiratory failure with hypoxia (HCC)    Pleural effusion on right 10/01/2020   Uncontrolled type 2 diabetes mellitus with hyperglycemia ([place]) 06/16/2020   Community acquired pneumonia of right lower lobe of lung    CHF exacerbation ([place]) 06/14/2020   Protein calorie malnutrition ([place]) 06/14/2020   CKD (chronic kidney disease) stage 3, GFR 30-59 ml/min ([place]) 05/03/2020   Hypertensive urgency 05/03/2020   Blurred vision, bilateral 05/03/2020   Acute on chronic diastolic (congestive) heart failure ([place]) 05/03/2020   Essential hypertension    Type 2 diabetes with nephropathy (HCC)    Acute on chronic diastolic CHF (congestive heart failure) ([place])    Acute renal failure superimposed on stage 3a chronic kidney disease ([place])    Class 2 obesity    Bilateral leg edema 04/05/2020   PCP:  [name], MD Pharmacy:   [place] [place] - [place] [address] [place] [address] Phone: [phone] Fax: [phone]   Readmission Risk Interventions Readmission Risk Prevention Plan 04/18/2021 09/27/2020  Transportation Screening Complete Complete  Home Care Screening Complete Complete  Medication Review (RN CM) Complete Complete  Some recent data might be hidden

## 2021-07-22 NOTE — Progress Notes (Signed)
OT Cancellation Note  Patient Details Name: [name] MRN: [id number] DOB: Apr 14, 1966   Cancelled Treatment:    Reason Eval/Treat Not Completed: Patient at procedure or test/ unavailable. Pt being taken to OR, will follow up as schedule allows.  [name], OTD, OTR/L Acute Rehab [phone]   [name] 07/22/2021, 8:22 AM

## 2021-07-22 NOTE — Progress Notes (Addendum)
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 BUN increasing, up to 97.   1575 cc UOP charted yesterday  Weight down another 4 lb with IV lasix + metolazone.   Feels okay. No dyspnea or CP. Less LE edema.     Objective:   Weight Range: 124.3 kg Body mass index is 38.22 kg/m.   Vital Signs:   Temp:  [97.9 F (36.6 C)-98.2 F (36.8 C)] 98.2 F (36.8 C) (12/30 0817) Pulse Rate:  [81-85] 81 (12/30 0817) Resp:  [16-18] 18 (12/30 0817) BP: (119-176)/(63-92) 176/92 (12/30 0817) SpO2:  [95 %-99 %] 95 % (12/30 0817) Weight:  [124.3 kg-124.5 kg] 124.3 kg (12/30 0817) Last BM Date: 07/20/21  Weight change: Filed Weights   07/21/21 0500 07/22/21 0635 07/22/21 0817  Weight: 126.4 kg 124.5 kg 124.3 kg    Intake/Output:   Intake/Output Summary (Last 24 hours) at 07/22/2021 1029 Last data filed at 07/22/2021 0927 Gross per 24 hour  Intake 1033 ml  Output 2175 ml  Net -1142 ml      Physical Exam    General:  Lying comfortably in bed HEENT: normal Neck: supple. no JVD. Carotids 2+ bilat; no bruits. No lymphadenopathy or thryomegaly appreciated. Cor: PMI nondisplaced. Regular rate & rhythm. No rubs, gallops or murmurs. HD cath right upper chest Lungs: clear Abdomen: soft, nontender, + distended. No hepatosplenomegaly. No bruits or masses. Good bowel sounds. Extremities: no cyanosis, clubbing, rash, + RUE fistula, ACE wraps b/l LE Neuro: alert & orientedx3, cranial nerves grossly intact. moves all 4 extremities w/o difficulty. Affect pleasant   Telemetry   Sinus 70s-80s, < 5 PVCs/min  Labs    CBC Recent Labs    07/21/21 0136  WBC 6.2  HGB 9.4*  HCT 29.2*  MCV 83.2  PLT 315     Basic Metabolic Panel Recent Labs    07/21/21 0136 07/22/21 0244  NA 137 135  K 4.6 4.4  CL 101 100  CO2 25 25  GLUCOSE 128* 114*  BUN 94* 97*  CREATININE 5.90* 6.31*  CALCIUM 8.6* 8.5*  PHOS 5.4* 5.8*   Liver Function Tests Recent Labs    07/21/21 0136 07/22/21 0244  ALBUMIN 2.8* 2.8*   No results for input(s): LIPASE, AMYLASE in the last 72 hours. Cardiac Enzymes No results for input(s): CKTOTAL, CKMB, CKMBINDEX, TROPONINI in the last 72 hours.  BNP: BNP (last 3 results) Recent Labs    06/24/21 1250 07/15/21 1130 07/15/21 1259  BNP 615.1* 638.5* 620.0*    ProBNP (last 3 results) No results for input(s): PROBNP in the last 8760 hours.   D-Dimer No results for input(s): DDIMER in the last 72 hours. Hemoglobin A1C No results for input(s): HGBA1C in the last 72 hours. Fasting Lipid Panel No results for input(s): CHOL, HDL, LDLCALC, TRIG, CHOLHDL, LDLDIRECT in the last 72 hours. Thyroid Function Tests No results for input(s): TSH, T4TOTAL, T3FREE, THYROIDAB in the last 72 hours.  Invalid input(s): FREET3  Other results:   Imaging    DG C-Arm 1-60 Min-No Report  Result Date: 07/22/2021 Fluoroscopy was utilized by the requesting physician.  No radiographic interpretation.   VAS [place] UPPER EXT VEIN MAPPING (PRE-OP AVF)  Result Date: 07/21/2021 UPPER EXTREMITY VEIN MAPPING Patient  Name:  [name]  Date of Exam:   07/21/2021 Medical Rec #: [id number]        Accession #:    [id number] Date of Birth: [birth year]        Patient Gender: M Patient Age:   55 years Exam Location:  [facility] Procedure:      VAS [place] UPPER EXT VEIN MAPPING (PRE-OP AVF) Referring Phys: Dagoberto Ligas --------------------------------------------------------------------------------  Indications: Pre-access. Comparison Study: No prior studies. Performing Technologist: Oliver Hum RVT  Examination Guidelines: A complete evaluation includes B-mode imaging, spectral  Doppler, color Doppler, and power Doppler as needed of all accessible portions of each vessel. Bilateral testing is considered an integral part of a complete examination. Limited examinations for reoccurring indications may be performed as noted. +-----------------+-------------+----------+---------+  Right Cephalic    Diameter (cm) Depth (cm) Findings   +-----------------+-------------+----------+---------+  Shoulder              0.45         1.90               +-----------------+-------------+----------+---------+  Prox upper arm        0.45         0.44               +-----------------+-------------+----------+---------+  Mid upper arm         0.43         0.27               +-----------------+-------------+----------+---------+  Dist upper arm        0.57         0.35               +-----------------+-------------+----------+---------+  Antecubital fossa     0.67         0.26    branching  +-----------------+-------------+----------+---------+  Prox forearm          0.44         0.29    branching  +-----------------+-------------+----------+---------+  Mid forearm           0.41         0.23    branching  +-----------------+-------------+----------+---------+  Dist forearm          0.37         0.26               +-----------------+-------------+----------+---------+ +-----------------+-------------+----------+---------+  Left Cephalic     Diameter (cm) Depth (cm) Findings   +-----------------+-------------+----------+---------+  Shoulder              0.49         2.30               +-----------------+-------------+----------+---------+  Prox upper arm        0.37         1.50               +-----------------+-------------+----------+---------+  Mid upper arm         0.34         0.43               +-----------------+-------------+----------+---------+  Dist upper arm        0.40         0.42               +-----------------+-------------+----------+---------+  Antecubital fossa     0.50         0.32  branching  +-----------------+-------------+----------+---------+  Prox forearm          0.34         0.40    branching  +-----------------+-------------+----------+---------+  Mid forearm           0.47         0.28    branching  +-----------------+-------------+----------+---------+  Dist forearm          0.45         0.27               +-----------------+-------------+----------+---------+ *See table(s) above for measurements and observations.  Diagnosing physician: Harold Barban MD Electronically signed by Harold Barban MD on 07/21/2021 at 8:02:37 PM.    Final      Medications:     Scheduled Medications:  [MAR Hold] darbepoetin (ARANESP) injection - NON-DIALYSIS  100 mcg Subcutaneous Q Mon-1800   [MAR Hold] heparin  5,000 Units Subcutaneous Q8H   [MAR Hold] hydrALAZINE  100 mg Oral TID   [MAR Hold] insulin aspart  0-6 Units Subcutaneous TID WC   [MAR Hold] labetalol  600 mg Oral BID   [MAR Hold] metolazone  5 mg Oral Daily   [MAR Hold] pravastatin  40 mg Oral Daily    Infusions:  sodium chloride 10 mL/hr at 07/22/21 0928   [MAR Hold] ferric gluconate (FERRLECIT) IVPB Stopped (07/21/21 1113)   [MAR Hold] furosemide 66 mL/hr at 07/22/21 0334    PRN Medications: 0.9 % irrigation (POUR BTL), [MAR Hold] acetaminophen **OR** [MAR Hold] acetaminophen, heparin, heparin sodium (porcine), [MAR Hold] ondansetron     Assessment/Plan    1. Acute on Chronic diastolic CHF: Most recent echo in 9/22 showed EF 55-60%, moderate LVH, grade 2 diastolic dysfunction, normal RV size and systolic function.  Multiple admissions for CHF.  Now admitted w/ a/c CHF w/ marked fluid overload with NYHA class IIIb-IV symptoms, c/b worsening Stage IV CKD. SCr now up to 6.31 (previous baseline ~4.1). He is diuresing w/ high dose lasix + metolazone, but slowly. Nephrology following. Likely ESRD. Had right arm fistula and HD cath placed today - Continue IV Lasix 160 mg q8H per nephrology + metolazone 5 mg daily for  now.   - Planning to start HD tomorrow - continue UNNA boots. - Cannot use Jardiance or ARB/ARNi with elevated creatinine.  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 Myeloma panel negative. Will need genetic testing and initiation of Tafamadis as outpatient.   2. AKI on CKD stage 4: Pre admit baseline Scr ~ 4.18. Now steadily trending up, 4.91>>5.25>>5.51>>5.81>5.9>>6.31. BUN 97. K 4.4 - He followed with Dr. Theador Hawthorne outpatient. - inpatient nephrology team now following and helping to dose diuretics    3. OSA: CPAP ordered, pt refusing.    4. HTN: BP elevated - Continue labetalol 600 mg bid for now. - Continue hydralazine 100 mg tid.  - Off lisinopril with hyperkalemia - Continue diuretics - Nephrology considering addition of norvasc after HD   5. DM2 - cover with SSI   6. Hyperkalemia - resolved, 4.4 today  - continue w/ IV Lasix + metolazone per nephrology  - Lokelma PRN    Length of Stay: [place], [place], PA-C  07/22/2021, 10:29 AM  Advanced Heart Failure Team Pager [phone] (M-F; 7a - 5p)  Please contact [facility] for night-coverage after hours (5p -7a ) and weekends on [email or web address]  Patient seen with PA, agree with the above note.    Creatinine continues to rise, note  plan from renal to initiate HD tomorrow.  Will continue diuretics as per renal, plan to back off when HD initiated.    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  Myeloma panel negative, needs urine immunofixation.  Will get genetic testing and initiate tafamidis at outpatient followup.   Cardiology to follow at a distance.   Loralie Champagne 07/22/2021

## 2021-07-23 ENCOUNTER — Encounter (HOSPITAL_COMMUNITY): Payer: Self-pay | Admitting: Surgery

## 2021-07-23 DIAGNOSIS — I5033 Acute on chronic diastolic (congestive) heart failure: Secondary | ICD-10-CM | POA: Diagnosis not present

## 2021-07-23 LAB — CBC
HCT: 29.5 % — ABNORMAL LOW (ref 39.0–52.0)
Hemoglobin: 9.7 g/dL — ABNORMAL LOW (ref 13.0–17.0)
MCH: 27.3 pg (ref 26.0–34.0)
MCHC: 32.9 g/dL (ref 30.0–36.0)
MCV: 83.1 fL (ref 80.0–100.0)
Platelets: 286 10*3/uL (ref 150–400)
RBC: 3.55 MIL/uL — ABNORMAL LOW (ref 4.22–5.81)
RDW: 14.3 % (ref 11.5–15.5)
WBC: 7.1 10*3/uL (ref 4.0–10.5)
nRBC: 0.4 % — ABNORMAL HIGH (ref 0.0–0.2)

## 2021-07-23 LAB — GLUCOSE, CAPILLARY
Glucose-Capillary: 159 mg/dL — ABNORMAL HIGH (ref 70–99)
Glucose-Capillary: 183 mg/dL — ABNORMAL HIGH (ref 70–99)
Glucose-Capillary: 145 mg/dL — ABNORMAL HIGH (ref 70–99)

## 2021-07-23 LAB — RENAL FUNCTION PANEL
Albumin: 2.8 g/dL — ABNORMAL LOW (ref 3.5–5.0)
Anion gap: 13 (ref 5–15)
BUN: 102 mg/dL — ABNORMAL HIGH (ref 6–20)
CO2: 24 mmol/L (ref 22–32)
Calcium: 8.2 mg/dL — ABNORMAL LOW (ref 8.9–10.3)
Chloride: 96 mmol/L — ABNORMAL LOW (ref 98–111)
Creatinine, Ser: 6.93 mg/dL — ABNORMAL HIGH (ref 0.61–1.24)
GFR, Estimated: 9 mL/min — ABNORMAL LOW (ref >60)
Glucose, Bld: 158 mg/dL — ABNORMAL HIGH (ref 70–99)
Phosphorus: 6.1 mg/dL — ABNORMAL HIGH (ref 2.5–4.6)
Potassium: 4.3 mmol/L (ref 3.5–5.1)
Sodium: 133 mmol/L — ABNORMAL LOW (ref 135–145)

## 2021-07-23 LAB — HEPATITIS B SURFACE ANTIBODY, QUANTITATIVE: Hep B S AB Quant (Post): <3.1 m[IU]/mL — ABNORMAL LOW (ref >9.9)

## 2021-07-23 MED ORDER — NALOXONE HCL 0.4 MG/ML IJ SOLN: 0.4000 mg | INTRAMUSCULAR | Status: DC | PRN

## 2021-07-23 MED ORDER — HEPARIN SODIUM (PORCINE) 1000 UNIT/ML IJ SOLN
INTRAMUSCULAR | Status: AC
  Administered 2021-07-23: 1000 [IU]
  Filled 2021-07-23: qty 4

## 2021-07-23 MED ORDER — HYDROMORPHONE HCL 1 MG/ML IJ SOLN
0.5000 mg | INTRAMUSCULAR | Status: DC | PRN
  Administered 2021-07-23: 0.5 mg via INTRAVENOUS
  Filled 2021-07-23: qty 1

## 2021-07-23 NOTE — Progress Notes (Signed)
[facility]  Subjective  -no complaints this morning.   Objective 127/66 80 97.8 F (36.6 C) (Oral) 20 92%  Intake/Output Summary (Last 24 hours) at 07/23/2021 0857 Last data filed at 07/23/2021 0428 Gross per 24 hour  Intake 820 ml  Output 1810 ml  Net -990 ml    Right radiocephalic fistula with excellent thrill Right forearm incision clean dry and intact Right radial palpable Right TDC in place with no obvious hematoma  Laboratory Lab Results: Recent Labs    07/21/21 0136  WBC 6.2  HGB 9.4*  HCT 29.2*  PLT 315   BMET Recent Labs    07/22/21 0244 07/23/21 0200  NA 135 133*  K 4.4 4.3  CL 100 96*  CO2 25 24  GLUCOSE 114* 158*  BUN 97* 102*  CREATININE 6.31* 6.93*  CALCIUM 8.5* 8.2*    COAG Lab Results  Component Value Date   INR 1.0 05/03/2020   No results found for: PTT  Assessment/Planning:  Postop day 1 status post right radiocephalic AV fistula and TDC placement.  Right radiocephalic fistula has excellent thrill and incision looks good.  No signs of steal syndrome.  Can use catheter from my standpoint.  I will arrange follow-up in 4 weeks for fistula duplex in the office and to monitor for maturation.  Discussed with the patient he will need to use the catheter for least the next 3 months until the fistula matures.  We will sign off and please call with questions or concerns.  Marty Heck 07/23/2021 8:57 AM --

## 2021-07-23 NOTE — Progress Notes (Signed)
[facility] PROGRESS NOTE    Progress Note  [name]  NGE:[id number] DOB: January 19, 1966 DOA: 07/15/2021 PCP: [name], MD     Brief Narrative:   [name] is an 55 y.o. male history of chronic diastolic heart failure, chronic kidney disease stage IV, referred to the ED on 07/16/2019 to from the cardiology clinic due to volume overload.  Chest x-ray confirmed pulmonary congestion with bilateral pleural effusion BNP of 600 creatinine at baseline of 4 was started on IV Lasix and metolazone nephrology was consulted   Assessment/Plan:   Acute on chronic diastolic CHF (congestive heart failure) ([place]) Further management per advanced heart failure team and nephrology. Cardiology concerned about amyloid involvement PYP scan to be scheduled, myeloma panel is pending  Acute respiratory failure with hypoxia due to pulmonary edema and heart failure: Has been weaned from oxygen therapy.  Chronic kidney disease stage IV: Follows with Dr. Theador Hawthorne as an outpatient.  Vascular access placed on 07/22/2021 Dialysis catheter, placed nephrology recommended to start dialysis on 07/23/2021. We also started clipping process.  Next session on Monday  Chronic lower extremity wound: No evidence of infection, continue Unna boot therapy.  Essential hypertension: Continue labetalol, hydralazine holding ACE inhibitor as he is no longer a candidate.  Hyperkalemia intermittent: Renal diet has been changed and no longer a candidate for ACE inhibitor or ARB, Entresto or Aldactone. Metolazone and Lasix helping. Now resolved.  Diabetes mellitus type 2: With an A1c of 7.1, blood glucose fairly controlled  Anemia of chronic renal disease: No signs of bleeding hemoglobin at 9.  Morbid obesity: He has been counseled.   DVT prophylaxis: lovenox Family Communication:none Status is: Inpatient  Remains inpatient appropriate because: Acute diastolic heart failure     Code Status:      Code Status Orders  (From admission, onward)           Start     Ordered   07/15/21 2205  Full code  Continuous        07/15/21 2205           Code Status History     Date Active Date Inactive Code Status Order ID Comments User Context   04/16/2021 1733 04/23/2021 1447 Full Code [id number]  Orson Eva, MD ED   09/27/2020 2033 10/01/2020 1804 Full Code [id number]  Bethena Roys, MD Inpatient   06/14/2020 2046 06/21/2020 1833 Full Code [id number]  Zierle-Ghosh, Asia B, DO Inpatient   05/03/2020 1805 05/07/2020 1900 Full Code [id number]  Roney Jaffe, MD ED   04/05/2020 1852 04/10/2020 2115 Full Code [id number]  Oswald Hillock, MD ED   07/21/2011 1126 07/24/2011 1439 Full Code [id number]  Pollie Meyer, LPN Inpatient         IV Access:   Peripheral IV   Procedures and diagnostic studies:   DG CHEST PORT 1 VIEW  Result Date: 07/22/2021 CLINICAL DATA:  Shortness of breath, CHF EXAM: PORTABLE CHEST 1 VIEW COMPARISON:  07/15/2021 FINDINGS: Transverse diameter of heart is increased. Central pulmonary vessels are more prominent. There is increased haziness in the right parahilar region. There is blunting of right lateral CP angle. There is improvement in aeration of right lower lung fields. Left lateral CP angle is clear. There is no pneumothorax. There is interval placement of dialysis catheter with its tip in the right atrium. IMPRESSION: Cardiomegaly. Central pulmonary vessels are prominent suggesting CHF. Haziness in the right mid and right lower lung fields suggests moderate right pleural effusion. There  is improvement in aeration of right lower lung fields since 07/15/2021, possibly suggesting decrease in pleural effusion and decrease in atelectasis. Electronically Signed   By: Elmer Picker M.D.   On: 07/22/2021 13:38   DG C-Arm 1-60 Min-No Report  Result Date: 07/22/2021 Fluoroscopy was utilized by the requesting physician.  No radiographic interpretation.    VAS [place] UPPER EXT VEIN MAPPING (PRE-OP AVF)  Result Date: 07/21/2021 UPPER EXTREMITY VEIN MAPPING Patient Name:  [name]  Date of Exam:   07/21/2021 Medical Rec #: [id number]        Accession #:    [id number] Date of Birth: 07-06-1966        Patient Gender: M Patient Age:   35 years Exam Location:  [facility] Procedure:      VAS [place] UPPER EXT VEIN MAPPING (PRE-OP AVF) Referring Phys: Dagoberto Ligas --------------------------------------------------------------------------------  Indications: Pre-access. Comparison Study: No prior studies. Performing Technologist: Oliver Hum RVT  Examination Guidelines: A complete evaluation includes B-mode imaging, spectral Doppler, color Doppler, and power Doppler as needed of all accessible portions of each vessel. Bilateral testing is considered an integral part of a complete examination. Limited examinations for reoccurring indications may be performed as noted. +-----------------+-------------+----------+---------+  Right Cephalic    Diameter (cm) Depth (cm) Findings   +-----------------+-------------+----------+---------+  Shoulder              0.45         1.90               +-----------------+-------------+----------+---------+  Prox upper arm        0.45         0.44               +-----------------+-------------+----------+---------+  Mid upper arm         0.43         0.27               +-----------------+-------------+----------+---------+  Dist upper arm        0.57         0.35               +-----------------+-------------+----------+---------+  Antecubital fossa     0.67         0.26    branching  +-----------------+-------------+----------+---------+  Prox forearm          0.44         0.29    branching  +-----------------+-------------+----------+---------+  Mid forearm           0.41         0.23    branching  +-----------------+-------------+----------+---------+  Dist forearm          0.37         0.26                +-----------------+-------------+----------+---------+ +-----------------+-------------+----------+---------+  Left Cephalic     Diameter (cm) Depth (cm) Findings   +-----------------+-------------+----------+---------+  Shoulder              0.49         2.30               +-----------------+-------------+----------+---------+  Prox upper arm        0.37         1.50               +-----------------+-------------+----------+---------+  Mid upper arm         0.34  0.43               +-----------------+-------------+----------+---------+  Dist upper arm        0.40         0.42               +-----------------+-------------+----------+---------+  Antecubital fossa     0.50         0.32    branching  +-----------------+-------------+----------+---------+  Prox forearm          0.34         0.40    branching  +-----------------+-------------+----------+---------+  Mid forearm           0.47         0.28    branching  +-----------------+-------------+----------+---------+  Dist forearm          0.45         0.27               +-----------------+-------------+----------+---------+ *See table(s) above for measurements and observations.  Diagnosing physician: Harold Barban MD Electronically signed by Harold Barban MD on 07/21/2021 at 8:02:37 PM.    Final      Medical Consultants:   None.   Subjective:    [name] no complaints this morning  Objective:    Vitals:   07/22/21 2149 07/23/21 0428 07/23/21 0519 07/23/21 0900  BP: (!) 148/82 127/66  134/70  Pulse: 85 80  79  Resp: 18 20  20   Temp: 98 F (36.7 C) 97.8 F (36.6 C)  97.7 F (36.5 C)  TempSrc: Oral Oral  Oral  SpO2: 96% 92%  92%  Weight:   124.4 kg   Height:       SpO2: 92 % O2 Flow Rate (L/min): 2 L/min   Intake/Output Summary (Last 24 hours) at 07/23/2021 1153 Last data filed at 07/23/2021 0900 Gross per 24 hour  Intake 792 ml  Output 1200 ml  Net -408 ml    Filed Weights   07/22/21 0635 07/22/21 0817  07/23/21 0519  Weight: 124.5 kg 124.3 kg 124.4 kg    Exam: General exam: In no acute distress. Respiratory system: Good air movement and clear to auscultation. Cardiovascular system: S1 & S2 heard, RRR. No JVD. Gastrointestinal system: Abdomen is nondistended, soft and nontender.  Extremities: No pedal edema. Skin: No rashes, lesions or ulcers Psychiatry: Judgement and insight appear normal. Mood & affect appropriate.   Data Reviewed:    Labs: Basic Metabolic Panel: Recent Labs  Lab 07/19/21 0323 07/20/21 0427 07/21/21 0136 07/22/21 0244 07/23/21 0200  NA 139 137 137 135 133*  K 5.2* 4.9 4.6 4.4 4.3  CL 106 102 101 100 96*  CO2 22 24 25 25 24   GLUCOSE 113* 136* 128* 114* 158*  BUN 80* 87* 94* 97* 102*  CREATININE 5.51* 5.81* 5.90* 6.31* 6.93*  CALCIUM 8.5* 8.3* 8.6* 8.5* 8.2*  PHOS  --   --  5.4* 5.8* 6.1*    GFR Estimated Creatinine Clearance: 16 mL/min (A) (by C-G formula based on SCr of 6.93 mg/dL (H)). Liver Function Tests: Recent Labs  Lab 07/21/21 0136 07/22/21 0244 07/23/21 0200  ALBUMIN 2.8* 2.8* 2.8*    No results for input(s): LIPASE, AMYLASE in the last 168 hours. No results for input(s): AMMONIA in the last 168 hours. Coagulation profile No results for input(s): INR, PROTIME in the last 168 hours. COVID-19 Labs  No results for input(s): DDIMER, FERRITIN, LDH, CRP in the last 72 hours.  Lab Results  Component Value Date   SARSCOV2NAA NEGATIVE 07/15/2021   SARSCOV2NAA NEGATIVE 04/16/2021   [place] NEGATIVE 09/27/2020   [place] NEGATIVE 06/14/2020    CBC: Recent Labs  Lab 07/17/21 0258 07/21/21 0136  WBC 4.9 6.2  HGB 8.7* 9.4*  HCT 27.6* 29.2*  MCV 84.9 83.2  PLT 270 315    Cardiac Enzymes: No results for input(s): CKTOTAL, CKMB, CKMBINDEX, TROPONINI in the last 168 hours. BNP (last 3 results) No results for input(s): PROBNP in the last 8760 hours. CBG: Recent Labs  Lab 07/22/21 0820 07/22/21 1108 07/22/21 1648  07/22/21 2149 07/23/21 0754  GLUCAP 129* 117* 192* 216* 159*    D-Dimer: No results for input(s): DDIMER in the last 72 hours. Hgb A1c: No results for input(s): HGBA1C in the last 72 hours. Lipid Profile: No results for input(s): CHOL, HDL, LDLCALC, TRIG, CHOLHDL, LDLDIRECT in the last 72 hours. Thyroid function studies: No results for input(s): TSH, T4TOTAL, T3FREE, THYROIDAB in the last 72 hours.  Invalid input(s): FREET3 Anemia work up: No results for input(s): VITAMINB12, FOLATE, FERRITIN, TIBC, IRON, RETICCTPCT in the last 72 hours.  Sepsis Labs: Recent Labs  Lab 07/17/21 0258 07/21/21 0136  WBC 4.9 6.2    Microbiology Recent Results (from the past 240 hour(s))  Resp Panel by RT-PCR (Flu A&B, Covid) Nasopharyngeal Swab     Status: None   Collection Time: 07/15/21  1:00 PM   Specimen: Nasopharyngeal Swab; Nasopharyngeal(NP) swabs in vial transport medium  Result Value Ref Range Status   SARS Coronavirus 2 by RT PCR NEGATIVE NEGATIVE Final    Comment: (NOTE) SARS-CoV-2 target nucleic acids are NOT DETECTED.  The SARS-CoV-2 RNA is generally detectable in upper respiratory specimens during the acute phase of infection. The lowest concentration of SARS-CoV-2 viral copies this assay can detect is 138 copies/mL. A negative result does not preclude SARS-Cov-2 infection and should not be used as the sole basis for treatment or other patient management decisions. A negative result may occur with  improper specimen collection/handling, submission of specimen other than nasopharyngeal swab, presence of viral mutation(s) within the areas targeted by this assay, and inadequate number of viral copies(<138 copies/mL). A negative result must be combined with clinical observations, patient history, and epidemiological information. The expected result is Negative.  Fact Sheet for Patients:  EntrepreneurPulse.com.au  Fact Sheet for Healthcare Providers:   IncredibleEmployment.be  This test is no t yet approved or cleared by the [place] FDA and  has been authorized for detection and/or diagnosis of SARS-CoV-2 by FDA under an Emergency Use Authorization (EUA). This EUA will remain  in effect (meaning this test can be used) for the duration of the COVID-19 declaration under Section 564(b)(1) of the Act, 21 U.S.C.section 360bbb-3(b)(1), unless the authorization is terminated  or revoked sooner.       Influenza A by PCR NEGATIVE NEGATIVE Final   Influenza B by PCR NEGATIVE NEGATIVE Final    Comment: (NOTE) The Xpert Xpress SARS-CoV-2/FLU/RSV plus assay is intended as an aid in the diagnosis of influenza from Nasopharyngeal swab specimens and should not be used as a sole basis for treatment. Nasal washings and aspirates are unacceptable for Xpert Xpress SARS-CoV-2/FLU/RSV testing.  Fact Sheet for Patients: EntrepreneurPulse.com.au  Fact Sheet for Healthcare Providers: IncredibleEmployment.be  This test is not yet approved or cleared by the [place] FDA and has been authorized for detection and/or diagnosis of SARS-CoV-2 by FDA under an Emergency Use Authorization (EUA). This EUA will remain in  effect (meaning this test can be used) for the duration of the COVID-19 declaration under Section 564(b)(1) of the Act, 21 U.S.C. section 360bbb-3(b)(1), unless the authorization is terminated or revoked.  Performed at [facility], [place] [address], [place], [place] [address]   Surgical pcr screen     Status: None   Collection Time: 07/21/21  9:10 PM   Specimen: Nasal Mucosa; Nasal Swab  Result Value Ref Range Status   MRSA, PCR NEGATIVE NEGATIVE Final   Staphylococcus aureus NEGATIVE NEGATIVE Final    Comment: (NOTE) The Xpert SA Assay (FDA approved for NASAL specimens in patients 16 years of age and older), is one component of a comprehensive surveillance program.  It is not intended to diagnose infection nor to guide or monitor treatment. Performed at [facility], [place] [address] [place], [place] [address]      Medications:    Chlorhexidine Gluconate Cloth  6 each Topical Daily   darbepoetin (ARANESP) injection - NON-DIALYSIS  100 mcg Subcutaneous Q Mon-1800   heparin  5,000 Units Subcutaneous Q8H   hydrALAZINE  100 mg Oral TID   insulin aspart  0-6 Units Subcutaneous TID WC   labetalol  600 mg Oral BID   metolazone  5 mg Oral Daily   pravastatin  40 mg Oral Daily   Continuous Infusions:  sodium chloride     sodium chloride     furosemide 160 mg (07/23/21 0525)      LOS: 8 days   Charlynne Cousins  [facility]  07/23/2021, 11:53 AM

## 2021-07-23 NOTE — Progress Notes (Signed)
[facility] Progress Note  Name: [name] MRN: [id number] DOB: 10/17/1965   Subjective:  Patient feels well today with no complaints.  Planning for dialysis  --------------- Background on consult:  [name] is a 55 y.o. 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  Note that on 06/24/2021 his metolazone was increased to 5 mg 5 days a week given persistent overload at that time per charting.  When he was seen in heart failure follow-up clinic on 12/23 his weight was noted to be up more than 20 pounds and he was directed to the hospital for IV diuretics.  Dr. Haroldine Laws recommended Lasix 160 mg IV every 6 hours plus metolazone 5 mg daily with the knowledge that he may need to start dialysis if he does not respond.  Cardiology is also planning to rule out amyloid as well.  Creatinine trends as below.  700 and mils of urine recorded on 12/23 and 1.3 L as well as one unmeasured urine void reported today.  He follows with Dr. Theador Hawthorne.  Per nephrology notes his metolazone was increased to 5 mg three times a week at the 06/08/21 office appointment.  He does think that his thighs don't feel tight since yesterday.  He would want dialysis if it were needed - he thought was going to have to start when GFR was 12 but it got better.  He does not have access. Short of breath after ambulating to restroom - just had diarrhea.    Intake/Output Summary (Last 24 hours) at 07/23/2021 1021 Last data filed at 07/23/2021 0900 Gross per 24 hour  Intake 1192 ml  Output 1210 ml  Net -18 ml    Vitals:  Vitals:   07/22/21 2149 07/23/21 0428 07/23/21 0519 07/23/21 0900  BP: (!) 148/82 127/66  134/70  Pulse: 85 80  79  Resp: 18 20  20   Temp: 98 F (36.7 C) 97.8 F (36.6 C)  97.7 F (36.5 C)  TempSrc: Oral Oral  Oral  SpO2: 96%  92%  92%  Weight:   124.4 kg   Height:         Physical Exam:   General: Sitting on side of the bed, nad HEENT: NCAT Eyes: EOMI sclera anicteric Heart: normal rate Lungs: Bilateral chest rise, no increased work of breathing Abdomen: softly distended /nt [name] habitus Extremities: 2+ pitting edema bilateral lower extremities Skin: chronic venous stasis changes Neuro: alert and oriented x 3 provides hx and follows commands Pysch normal mood and affect GU no foley  Medications reviewed   Labs:  BMP Latest Ref Rng & Units 07/23/2021 07/22/2021 07/21/2021  Glucose 70 - 99 mg/dL 158(H) 114(H) 128(H)  BUN 6 - 20 mg/dL 102(H) 97(H) 94(H)  Creatinine 0.61 - 1.24 mg/dL 6.93(H) 6.31(H) 5.90(H)  BUN/Creat Ratio 9 - 20 - - -  Sodium 135 - 145 mmol/L 133(L) 135 137  Potassium 3.5 - 5.1 mmol/L 4.3 4.4 4.6  Chloride 98 - 111 mmol/L 96(L) 100 101  CO2 22 - 32 mmol/L 24 25 25   Calcium 8.9 - 10.3 mg/dL 8.2(L) 8.5(L) 8.6(L)     Assessment/Plan:   # Acute on chronic diastolic CHF  - Continue lasix 160 mg IV every 8 hours and metolazone daily -> likely can decrease diuretic use once patient is on regular schedule of dialysis.  Likely  Monday -Management of fluid with dialysis at this time   # AKI on CKD stage IV.  Now ESRD - Cr was 3.74 with GFR 18 in November 2022 and in September GFR below 15 - holding home lisinopril and amlodipine - He follows with an outside practice, Dr. Theador Hawthorne [facility] -  needs access-patient has been hesitant to undergo dialysis and receive access placement.  Now agreed and s/p AVF creation and TDC placement on 12/30; appreciate help from [facility] - Plan to initiate HD on 12/31 and start CLIP process next week -Likely plan for second session of dialysis on Monday    # HTN  - optimize volume status with diuretics and dialysis as above- - consider restarting norvasc if BP remains high; wait until after HD   # OSA - note decreased  respiratory reserve   # DM type 2 - per primary team   - Anemia-  iron stores just a little low, will replete and have added ESA -  Hgb 9.4  Hyperkalemia-  resolved     Reesa Chew, MD 07/23/2021 10:21 AM

## 2021-07-23 NOTE — Progress Notes (Signed)
[facility] night cross cover note:  I was notified by RN that patient is complaining of pain at site of recently placed HD cath.  Received as needed Percocet earlier for similar pain, but patient reported that the Percocet made him feel "funny" and consequently requested an alternative for pain control.  Vital signs appear stable, including no recent hypotension. At patient's request, I have discontinued existing order for as needed Percocet, and, in the setting of the patient's renal dysfunction, ordered prn IV Dilaudid.     Babs Bertin, DO Hospitalist

## 2021-07-23 NOTE — Progress Notes (Signed)
Contacted Cheryl with [facility] to discuss [facility] referral from yesterday. She accepted the referral. She reports that he may have a co-payment since is the end of the year and he will notify the pt if he has a co-payment.

## 2021-07-23 NOTE — Progress Notes (Signed)
Occupational Therapy Treatment Patient Details Name: [name] MRN: [id number] DOB: 1966/06/14 Today's Date: 07/23/2021   History of present illness Pt is a 55 y.o. M who presents 07/15/2021 with volume overload. Significant PMH: chronic HFpEF, stage IV CKD, T2DM, HTN, anemia.   OT comments  Pt is incrementally progressing. He required supervision for grooming at the sink and increased time for pain management at neck sx site. Pt tolerated hallway ambulation with 3 standing rest breaks due to SOB with 1 SpO2 drop to 88%, which quickly recovered to >92% with breathing vc's. He continues to benefit from OT acutely. D/c recommendation updated to home health OT to progress towards indep.    Recommendations for follow up therapy are one component of a multi-disciplinary discharge planning process, led by the attending physician.  Recommendations may be updated based on patient status, additional functional criteria and insurance authorization.    Follow Up Recommendations  Home health OT    Assistance Recommended at Discharge Intermittent Supervision/Assistance  Equipment Recommendations  Tub/shower bench       Precautions / Restrictions Precautions Precautions: Fall Precaution Comments: watch Spo2 Restrictions Weight Bearing Restrictions: No       Mobility Bed Mobility Overal bed mobility: Modified Independent             General bed mobility comments: increased time this session for pain management    Transfers Overall transfer level: Modified independent Equipment used: Rolling walker (2 wheels)               General transfer comment: no AD for sit<>stand. ambulated ~56ft without AD. Pt more steady with RW,     Balance Overall balance assessment: Needs assistance Sitting-balance support: Feet supported Sitting balance-Leahy Scale: Good     Standing balance support: No upper extremity supported;During functional activity Standing balance-Leahy Scale:  Fair                             ADL either performed or assessed with clinical judgement   ADL Overall ADL's : Needs assistance/impaired     Grooming: Supervision/safety;Standing Grooming Details (indicate cue type and reason): washing face at the sink, vc to avoid sx site                 Toilet Transfer: Min guard;Ambulation;Rolling walker (2 wheels) Toilet Transfer Details (indicate cue type and reason): simulated. wtih RW this session         Functional mobility during ADLs: Min guard General ADL Comments: poor activity tolerance. ambulated with RW, hallway distance. 3 standing rest breaks.    Extremity/Trunk Assessment Upper Extremity Assessment Upper Extremity Assessment: Generalized weakness   Lower Extremity Assessment Lower Extremity Assessment: Defer to PT evaluation        Vision   Vision Assessment?: No apparent visual deficits   Perception Perception Perception: Not tested   Praxis Praxis Praxis: Not tested    Cognition Arousal/Alertness: Awake/alert Behavior During Therapy: WFL for tasks assessed/performed Overall Cognitive Status: Within Functional Limits for tasks assessed                  General Comments SpO2 down to 88% after funcitonal mobility. Quickly recovered to >92% with vc for breathing technique    Pertinent Vitals/ Pain       Pain Assessment: Faces Faces Pain Scale: Hurts a little bit Pain Location: neck, sx site Pain Descriptors / Indicators: Discomfort;Grimacing;Guarding Pain Intervention(s): Monitored during session  Home Living  Prior Functioning/Environment    Frequency  Min 2X/week        Progress Toward Goals  OT Goals(current goals can now be found in the care plan section)  Progress towards OT goals: Progressing toward goals  Acute Rehab OT Goals Patient Stated Goal: less fatigue OT Goal Formulation: With patient Time For Goal Achievement: 08/03/21 Potential to Achieve Goals:  Good ADL Goals Pt Will Perform Lower Body Bathing: with set-up;sit to/from stand Pt Will Perform Lower Body Dressing: with set-up;sit to/from stand Additional ADL Goal #1: pt will tolerate at least 8 minutes of OOB functional activity at a mod I level Additional ADL Goal #2: Pt will indep verbalize at least 3 energy conservation stategies  Plan Discharge plan needs to be updated       AM-PAC OT "6 Clicks" Daily Activity     Outcome Measure   Help from another person eating meals?: None Help from another person taking care of personal grooming?: A Little Help from another person toileting, which includes using toliet, bedpan, or urinal?: A Little Help from another person bathing (including washing, rinsing, drying)?: A Little Help from another person to put on and taking off regular upper body clothing?: None Help from another person to put on and taking off regular lower body clothing?: A Little 6 Click Score: 20    End of Session Equipment Utilized During Treatment: Gait belt;Rolling walker (2 wheels)  OT Visit Diagnosis: Unsteadiness on feet (R26.81);Muscle weakness (generalized) (M62.81)   Activity Tolerance Patient tolerated treatment well   Patient Left in bed;with call bell/phone within reach   Nurse Communication Mobility status        Time: 3343-5686 OT Time Calculation (min): 13 min  Charges: OT General Charges $OT Visit: 1 Visit OT Treatments $Therapeutic Activity: 8-22 mins   Ludia Gartland A Aariah Godette 07/23/2021, 11:51 AM

## 2021-07-23 NOTE — Progress Notes (Signed)
Pt  complaining of 8/10 surgical pain from HD cath placement ; pt stated that the Percocet medication previously administered made him feel funny. Tylenol given, Hower[place], MD made aware & awaiting further orders.   Elaina Hoops, RN

## 2021-07-24 LAB — GLUCOSE, CAPILLARY
Glucose-Capillary: 195 mg/dL — ABNORMAL HIGH (ref 70–99)
Glucose-Capillary: 144 mg/dL — ABNORMAL HIGH (ref 70–99)
Glucose-Capillary: 170 mg/dL — ABNORMAL HIGH (ref 70–99)
Glucose-Capillary: 143 mg/dL — ABNORMAL HIGH (ref 70–99)

## 2021-07-24 LAB — RENAL FUNCTION PANEL
Albumin: 2.8 g/dL — ABNORMAL LOW (ref 3.5–5.0)
Anion gap: 11 (ref 5–15)
BUN: 81 mg/dL — ABNORMAL HIGH (ref 6–20)
CO2: 27 mmol/L (ref 22–32)
Calcium: 8 mg/dL — ABNORMAL LOW (ref 8.9–10.3)
Chloride: 98 mmol/L (ref 98–111)
Creatinine, Ser: 6.12 mg/dL — ABNORMAL HIGH (ref 0.61–1.24)
GFR, Estimated: 10 mL/min — ABNORMAL LOW (ref >60)
Glucose, Bld: 132 mg/dL — ABNORMAL HIGH (ref 70–99)
Phosphorus: 6 mg/dL — ABNORMAL HIGH (ref 2.5–4.6)
Potassium: 4.5 mmol/L (ref 3.5–5.1)
Sodium: 136 mmol/L (ref 135–145)

## 2021-07-24 MED ORDER — POLYETHYLENE GLYCOL 3350 17 G PO PACK
17.0000 g | PACK | Freq: Two times a day (BID) | ORAL | Status: AC
  Administered 2021-07-24 – 2021-07-25 (×3): 17 g via ORAL
  Filled 2021-07-24 (×4): qty 1

## 2021-07-24 MED ORDER — FUROSEMIDE 40 MG PO TABS
80.0000 mg | ORAL_TABLET | Freq: Two times a day (BID) | ORAL | Status: DC
  Administered 2021-07-24 – 2021-08-02 (×17): 80 mg via ORAL
  Filled 2021-07-24 (×17): qty 2

## 2021-07-24 NOTE — Progress Notes (Signed)
Mobility Specialist: Progress Note   07/24/21 1708  Mobility  Activity Ambulated in hall  Level of Assistance Contact guard assist, steadying assist  Assistive Device None  Distance Ambulated (ft) 300 ft  Mobility Ambulated with assistance in hallway  Mobility Response Tolerated well  Mobility performed by Mobility specialist  $Mobility charge 1 Mobility   Pre-Mobility: 85 HR Post-Mobility: 91 HR  Pt independent to EOB as well as to stand. Pt slightly unsteady upon standing and throughout ambulation, no c/o throughout. Pt stopped x1 for brief standing break. Pt sitting EOB after returning to room with family present in the room.   [facility] Vincente Asbridge Mobility Specialist Mobility Specialist 4 [place]: [phone] Mobility Specialist 2 [place] and [place]: [phone]

## 2021-07-24 NOTE — Progress Notes (Signed)
[facility] PROGRESS NOTE    Progress Note  [name]  PYP:[id number] DOB: [birth year] DOA: 07/15/2021 PCP: [name], MD     Brief Narrative:   [name] is an 56 y.o. male history of chronic diastolic heart failure, chronic kidney disease stage IV, referred to the ED on 07/16/2019 to from the cardiology clinic due to volume overload.  Chest x-ray confirmed pulmonary congestion with bilateral pleural effusion BNP of 600 creatinine at baseline of 4 was started on IV Lasix and metolazone nephrology was consulted due to poor improvement vascular was consulted and vascular access was placed on 07/22/2022.  Dialysis began on 07/24/2019    Assessment/Plan:   Acute on chronic diastolic CHF (congestive heart failure) [facility]) Further management per advanced heart failure team and nephrology. Cardiology concerned about amyloid involvement PYP scan to be scheduled, myeloma panel is pending  Acute respiratory failure with hypoxia due to pulmonary edema and heart failure: Has been weaned from oxygen therapy.  Chronic kidney disease stage IV: Follows with Dr. Theador Hawthorne as an outpatient.  Vascular access placed on 07/22/2021 Dialysis catheter, placed nephrology recommended to start dialysis on 07/23/2021. We also started clipping process.  Next session on Monday Appreciate renal's assistance.  Further management per renal  Chronic lower extremity wound: No evidence of infection, continue Unna boot therapy.  Essential hypertension: Continue labetalol, hydralazine holding ACE inhibitor as he is no longer a candidate.  Hyperkalemia intermittent: Renal diet has been changed  Now resolved.  Diabetes mellitus type 2: With an A1c of 7.1, blood glucose fairly controlled  Anemia of chronic renal disease: No signs of bleeding hemoglobin at 9.  Morbid obesity: He has been counseled.   DVT prophylaxis: lovenox Family Communication:none Status is: Inpatient  Remains  inpatient appropriate because: Acute diastolic heart failure     Code Status:     Code Status Orders  (From admission, onward)           Start     Ordered   07/15/21 2205  Full code  Continuous        07/15/21 2205           Code Status History     Date Active Date Inactive Code Status Order ID Comments User Context   04/16/2021 1733 04/23/2021 1447 Full Code [id number]  Orson Eva, MD ED   09/27/2020 2033 10/01/2020 1804 Full Code [id number]  Bethena Roys, MD Inpatient   06/14/2020 2046 06/21/2020 1833 Full Code [id number]  Zierle-Ghosh, Asia B, DO Inpatient   05/03/2020 1805 05/07/2020 1900 Full Code [id number]  Roney Jaffe, MD ED   04/05/2020 1852 04/10/2020 2115 Full Code [id number]  Oswald Hillock, MD ED   07/21/2011 1126 07/24/2011 1439 Full Code [id number]  Pollie Meyer, LPN Inpatient         IV Access:   Peripheral IV   Procedures and diagnostic studies:   DG CHEST PORT 1 VIEW  Result Date: 07/22/2021 CLINICAL DATA:  Shortness of breath, CHF EXAM: PORTABLE CHEST 1 VIEW COMPARISON:  07/15/2021 FINDINGS: Transverse diameter of heart is increased. Central pulmonary vessels are more prominent. There is increased haziness in the right parahilar region. There is blunting of right lateral CP angle. There is improvement in aeration of right lower lung fields. Left lateral CP angle is clear. There is no pneumothorax. There is interval placement of dialysis catheter with its tip in the right atrium. IMPRESSION: Cardiomegaly. Central pulmonary vessels are prominent suggesting CHF. Haziness in the right  mid and right lower lung fields suggests moderate right pleural effusion. There is improvement in aeration of right lower lung fields since 07/15/2021, possibly suggesting decrease in pleural effusion and decrease in atelectasis. Electronically Signed   By: Elmer Picker M.D.   On: 07/22/2021 13:38   DG C-Arm 1-60 Min-No Report  Result Date:  07/22/2021 Fluoroscopy was utilized by the requesting physician.  No radiographic interpretation.     Medical Consultants:   None.   Subjective:    [name] feels good tolerated he tolerated his dialysis treatment well to yesterday.  Objective:    Vitals:   07/23/21 1646 07/23/21 2112 07/23/21 2125 07/24/21 0631  BP: 122/64 (!) 146/76 (!) 146/76 140/75  Pulse: 86 86 89   Resp: 18 18  18   Temp: 98.2 F (36.8 C) 98.3 F (36.8 C)    TempSrc: Oral Oral    SpO2: 97% 97%    Weight:    123.7 kg  Height:       SpO2: 97 % O2 Flow Rate (L/min): 2 L/min   Intake/Output Summary (Last 24 hours) at 07/24/2021 1005 Last data filed at 07/24/2021 0500 Gross per 24 hour  Intake 360 ml  Output 1000 ml  Net -640 ml    Filed Weights   07/23/21 1155 07/23/21 1405 07/24/21 0631  Weight: 125 kg 124 kg 123.7 kg    Exam: General exam: In no acute distress. Respiratory system: Good air movement and clear to auscultation. Cardiovascular system: S1 & S2 heard, RRR. No JVD. Gastrointestinal system: Abdomen is nondistended, soft and nontender.  Extremities: No pedal edema. Skin: No rashes, lesions or ulcers Psychiatry: Judgement and insight appear normal. Mood & affect appropriate. Data Reviewed:    Labs: Basic Metabolic Panel: Recent Labs  Lab 07/20/21 0427 07/21/21 0136 07/22/21 0244 07/23/21 0200 07/24/21 0255  NA 137 137 135 133* 136  K 4.9 4.6 4.4 4.3 4.5  CL 102 101 100 96* 98  CO2 24 25 25 24 27   GLUCOSE 136* 128* 114* 158* 132*  BUN 87* 94* 97* 102* 81*  CREATININE 5.81* 5.90* 6.31* 6.93* 6.12*  CALCIUM 8.3* 8.6* 8.5* 8.2* 8.0*  PHOS  --  5.4* 5.8* 6.1* 6.0*    GFR Estimated Creatinine Clearance: 18.1 mL/min (A) (by C-G formula based on SCr of 6.12 mg/dL (H)). Liver Function Tests: Recent Labs  Lab 07/21/21 0136 07/22/21 0244 07/23/21 0200 07/24/21 0255  ALBUMIN 2.8* 2.8* 2.8* 2.8*    No results for input(s): LIPASE, AMYLASE in the last 168  hours. No results for input(s): AMMONIA in the last 168 hours. Coagulation profile No results for input(s): INR, PROTIME in the last 168 hours. COVID-19 Labs  No results for input(s): DDIMER, FERRITIN, LDH, CRP in the last 72 hours.   Lab Results  Component Value Date   SARSCOV2NAA NEGATIVE 07/15/2021   SARSCOV2NAA NEGATIVE 04/16/2021   [place] NEGATIVE 09/27/2020   [place] NEGATIVE 06/14/2020    CBC: Recent Labs  Lab 07/21/21 0136 07/23/21 1215  WBC 6.2 7.1  HGB 9.4* 9.7*  HCT 29.2* 29.5*  MCV 83.2 83.1  PLT 315 286    Cardiac Enzymes: No results for input(s): CKTOTAL, CKMB, CKMBINDEX, TROPONINI in the last 168 hours. BNP (last 3 results) No results for input(s): PROBNP in the last 8760 hours. CBG: Recent Labs  Lab 07/22/21 2149 07/23/21 0754 07/23/21 1625 07/23/21 2113 07/24/21 0738  GLUCAP 216* 159* 183* 145* 195*    D-Dimer: No results for input(s): DDIMER in  the last 72 hours. Hgb A1c: No results for input(s): HGBA1C in the last 72 hours. Lipid Profile: No results for input(s): CHOL, HDL, LDLCALC, TRIG, CHOLHDL, LDLDIRECT in the last 72 hours. Thyroid function studies: No results for input(s): TSH, T4TOTAL, T3FREE, THYROIDAB in the last 72 hours.  Invalid input(s): FREET3 Anemia work up: No results for input(s): VITAMINB12, FOLATE, FERRITIN, TIBC, IRON, RETICCTPCT in the last 72 hours.  Sepsis Labs: Recent Labs  Lab 07/21/21 0136 07/23/21 1215  WBC 6.2 7.1    Microbiology Recent Results (from the past 240 hour(s))  Resp Panel by RT-PCR (Flu A&B, Covid) Nasopharyngeal Swab     Status: None   Collection Time: 07/15/21  1:00 PM   Specimen: Nasopharyngeal Swab; Nasopharyngeal(NP) swabs in vial transport medium  Result Value Ref Range Status   SARS Coronavirus 2 by RT PCR NEGATIVE NEGATIVE Final    Comment: (NOTE) SARS-CoV-2 target nucleic acids are NOT DETECTED.  The SARS-CoV-2 RNA is generally detectable in upper  respiratory specimens during the acute phase of infection. The lowest concentration of SARS-CoV-2 viral copies this assay can detect is 138 copies/mL. A negative result does not preclude SARS-Cov-2 infection and should not be used as the sole basis for treatment or other patient management decisions. A negative result may occur with  improper specimen collection/handling, submission of specimen other than nasopharyngeal swab, presence of viral mutation(s) within the areas targeted by this assay, and inadequate number of viral copies(<138 copies/mL). A negative result must be combined with clinical observations, patient history, and epidemiological information. The expected result is Negative.  Fact Sheet for Patients:  EntrepreneurPulse.com.au  Fact Sheet for Healthcare Providers:  IncredibleEmployment.be  This test is no t yet approved or cleared by the [place] FDA and  has been authorized for detection and/or diagnosis of SARS-CoV-2 by FDA under an Emergency Use Authorization (EUA). This EUA will remain  in effect (meaning this test can be used) for the duration of the COVID-19 declaration under Section 564(b)(1) of the Act, 21 U.S.C.section 360bbb-3(b)(1), unless the authorization is terminated  or revoked sooner.       Influenza A by PCR NEGATIVE NEGATIVE Final   Influenza B by PCR NEGATIVE NEGATIVE Final    Comment: (NOTE) The Xpert Xpress SARS-CoV-2/FLU/RSV plus assay is intended as an aid in the diagnosis of influenza from Nasopharyngeal swab specimens and should not be used as a sole basis for treatment. Nasal washings and aspirates are unacceptable for Xpert Xpress SARS-CoV-2/FLU/RSV testing.  Fact Sheet for Patients: EntrepreneurPulse.com.au  Fact Sheet for Healthcare Providers: IncredibleEmployment.be  This test is not yet approved or cleared by the [place] FDA and has been  authorized for detection and/or diagnosis of SARS-CoV-2 by FDA under an Emergency Use Authorization (EUA). This EUA will remain in effect (meaning this test can be used) for the duration of the COVID-19 declaration under Section 564(b)(1) of the Act, 21 U.S.C. section 360bbb-3(b)(1), unless the authorization is terminated or revoked.  Performed at [facility], [place] [address], [place], [place] [address]   Surgical pcr screen     Status: None   Collection Time: 07/21/21  9:10 PM   Specimen: Nasal Mucosa; Nasal Swab  Result Value Ref Range Status   MRSA, PCR NEGATIVE NEGATIVE Final   Staphylococcus aureus NEGATIVE NEGATIVE Final    Comment: (NOTE) The Xpert SA Assay (FDA approved for NASAL specimens in patients 41 years of age and older), is one component of a comprehensive surveillance program. It  is not intended to diagnose infection nor to guide or monitor treatment. Performed at [facility], [place] [address], [place], [place] [address]      Medications:    Chlorhexidine Gluconate Cloth  6 each Topical Daily   darbepoetin (ARANESP) injection - NON-DIALYSIS  100 mcg Subcutaneous Q Mon-1800   furosemide  80 mg Oral BID   heparin  5,000 Units Subcutaneous Q8H   hydrALAZINE  100 mg Oral TID   insulin aspart  0-6 Units Subcutaneous TID WC   labetalol  600 mg Oral BID   pravastatin  40 mg Oral Daily   Continuous Infusions:      LOS: 9 days   Charlynne Cousins  [facility]  07/24/2021, 10:05 AM

## 2021-07-24 NOTE — Progress Notes (Signed)
[facility] Progress Note  Name: [name] MRN: [id number] DOB: [birth year]   Subjective:  Patient feels well today with no complaints.  Planning for dialysis again tomorrow.  Tolerated yesterday with no issues.  Does not feel like he is urinating as much.  --------------- Background on consult:  [name] is a 56 y.o. 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  Note that on 06/24/2021 his metolazone was increased to 5 mg 5 days a week given persistent overload at that time per charting.  When he was seen in heart failure follow-up clinic on 12/23 his weight was noted to be up more than 20 pounds and he was directed to the hospital for IV diuretics.  Dr. Haroldine Laws recommended Lasix 160 mg IV every 6 hours plus metolazone 5 mg daily with the knowledge that he may need to start dialysis if he does not respond.  Cardiology is also planning to rule out amyloid as well.  Creatinine trends as below.  700 and mils of urine recorded on 12/23 and 1.3 L as well as one unmeasured urine void reported today.  He follows with Dr. Theador Hawthorne.  Per nephrology notes his metolazone was increased to 5 mg three times a week at the 06/08/21 office appointment.  He does think that his thighs don't feel tight since yesterday.  He would want dialysis if it were needed - he thought was going to have to start when GFR was 12 but it got better.  He does not have access. Short of breath after ambulating to restroom - just had diarrhea.    Intake/Output Summary (Last 24 hours) at 07/24/2021 1020 Last data filed at 07/24/2021 0500 Gross per 24 hour  Intake 360 ml  Output 1000 ml  Net -640 ml    Vitals:  Vitals:   07/23/21 2125 07/24/21 0631 07/24/21 1005 07/24/21 1006  BP: (!) 146/76 140/75 (!) 145/84 (!) 145/84  Pulse: 89  87 87  Resp:  18   17  Temp:      TempSrc:      SpO2:      Weight:  123.7 kg    Height:         Physical Exam:   General: Sitting on side of the bed, nad HEENT: NCAT Eyes: EOMI sclera anicteric Heart: normal rate Lungs: Bilateral chest rise, no increased work of breathing Abdomen: softly distended /nt [name] habitus Extremities: 2+ pitting edema bilateral lower extremities Skin: chronic venous stasis changes Neuro: alert and oriented x 3 provides hx and follows commands Pysch normal mood and affect GU no foley  Medications reviewed   Labs:  BMP Latest Ref Rng & Units 07/24/2021 07/23/2021 07/22/2021  Glucose 70 - 99 mg/dL 132(H) 158(H) 114(H)  BUN 6 - 20 mg/dL 81(H) 102(H) 97(H)  Creatinine 0.61 - 1.24 mg/dL 6.12(H) 6.93(H) 6.31(H)  BUN/Creat Ratio 9 - 20 - - -  Sodium 135 - 145 mmol/L 136 133(L) 135  Potassium 3.5 - 5.1 mmol/L 4.5 4.3 4.4  Chloride 98 - 111 mmol/L 98 96(L) 100  CO2 22 - 32 mmol/L 27 24 25   Calcium 8.9 - 10.3 mg/dL 8.0(L) 8.2(L) 8.5(L)     Assessment/Plan:   # Acute on chronic diastolic CHF  -Given we have started dialysis we will switch Lasix to 80 mg twice daily -Management of fluid  with dialysis at this time   # AKI on CKD stage IV.  Now ESRD - Cr was 3.74 with GFR 18 in November 2022 and in September GFR below 15 - holding home lisinopril and amlodipine - He follows with an outside practice, Dr. Theador Hawthorne [facility] -  needs access-patient has been hesitant to undergo dialysis and receive access placement.  Now agreed and s/p AVF creation and TDC placement on 12/30; appreciate help from [facility] -Plan for second session of dialysis tomorrow. -Start CLIP process on Tuesday when SSW returns    # HTN  - optimize volume status with diuretics and dialysis as above- - continue current medications   # OSA - note decreased respiratory reserve   # DM type 2 - per primary team   - Anemia-  iron stores just a little low, will replete and have  added ESA -  Hgb 9.7  Hyperkalemia-  resolved     Reesa Chew, MD 07/24/2021 10:20 AM

## 2021-07-25 LAB — RENAL FUNCTION PANEL
Albumin: 2.9 g/dL — ABNORMAL LOW (ref 3.5–5.0)
Anion gap: 12 (ref 5–15)
BUN: 91 mg/dL — ABNORMAL HIGH (ref 6–20)
CO2: 25 mmol/L (ref 22–32)
Calcium: 8.5 mg/dL — ABNORMAL LOW (ref 8.9–10.3)
Chloride: 99 mmol/L (ref 98–111)
Creatinine, Ser: 6.55 mg/dL — ABNORMAL HIGH (ref 0.61–1.24)
GFR, Estimated: 9 mL/min — ABNORMAL LOW (ref >60)
Glucose, Bld: 124 mg/dL — ABNORMAL HIGH (ref 70–99)
Phosphorus: 5.9 mg/dL — ABNORMAL HIGH (ref 2.5–4.6)
Potassium: 4.7 mmol/L (ref 3.5–5.1)
Sodium: 136 mmol/L (ref 135–145)

## 2021-07-25 LAB — GLUCOSE, CAPILLARY
Glucose-Capillary: 130 mg/dL — ABNORMAL HIGH (ref 70–99)
Glucose-Capillary: 148 mg/dL — ABNORMAL HIGH (ref 70–99)
Glucose-Capillary: 160 mg/dL — ABNORMAL HIGH (ref 70–99)

## 2021-07-25 NOTE — Progress Notes (Signed)
PT Cancellation Note  Patient Details Name: [name] MRN: [id number] DOB: May 27, 1966   Cancelled Treatment:    Reason Eval/Treat Not Completed: Patient at procedure or test/unavailable - at HD, will check back as schedule allows.  Stacie Glaze, PT DPT Acute Rehabilitation Services Pager [phone]  Office [phone]]    Louis Matte 07/25/2021, 9:05 AM

## 2021-07-25 NOTE — Progress Notes (Signed)
[facility] Progress Note   56 y.o. 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  Dr. Haroldine Laws rx  Lasix 160 mg IV every 6 hours plus metolazone 5 mg daily with the knowledge that he may need to start dialysis if he does not respond.  Cardiology is also planning to rule out amyloid as well.    He follows with Dr. Theador Hawthorne.  Per nephrology notes his metolazone was increased to 5 mg three times a week at the 06/08/21 office appointment.    Assessment/ Plan:   1)  Acute on chronic diastolic CHF  -Given we have started dialysis we will switch Lasix to 80 mg twice daily as long as UOP is good -Management of fluid with dialysis at this time   2)  AKI on CKD stage IV.  Now ESRD - Cr was 3.74 with GFR 18 in November 2022 and in September GFR below 15 - holding home lisinopril and amlodipine - He follows with an outside practice, Dr. Theador Hawthorne [facility] -  needs access-patient has been hesitant to undergo dialysis and receive access placement.  Now agreed and s/p AVF creation and TDC placement on 12/30; appreciate help from VVS  Seen on HD RIJ TC on 2k bath Goal 1.5L net UF 158/83  Start CLIP process on Tuesday when SSW returns     3)  HTN  - optimize volume status with diuretics and dialysis as above- - continue current medications   4)  OSA - note decreased respiratory reserve   5) DM type 2 - per primary team    6)  Anemia-  iron stores just a little low, will replete and have added ESA -  Hgb 9.7   7) Hyperkalemia-  resolved  Subjective:   Feels breathing is better. Denies f/c/n/v/cp.   Objective:   BP (!) 158/83    Pulse 82    Temp 98.3 F (36.8 C) (Oral)    Resp (!) 21    Ht 5\' 11"  (1.803 m)    Wt 126.1 kg    SpO2 95%    BMI 38.77 kg/m   Intake/Output Summary (Last 24 hours)  at 07/25/2021 3614 Last data filed at 07/25/2021 4315 Gross per 24 hour  Intake --  Output 1300 ml  Net -1300 ml   Weight change: -1.531 kg  Physical Exam: General: Supine in  bed, nad HEENT: NCAT Heart: normal rate Lungs: Bilateral chest rise, no increased work of breathing Abdomen: S, obese habitus Extremities: 2+ pitting edema bilateral lower extremities Skin: chronic venous stasis changes Neuro: alert and oriented x 3 provides hx and follows commands GU no foley    Imaging: No results found.  Labs: BMET Recent Labs  Lab 07/19/21 0323 07/20/21 0427 07/21/21 0136 07/22/21 0244 07/23/21 0200 07/24/21 0255 07/25/21 0429  NA 139 137 137 135 133* 136 136  K 5.2* 4.9 4.6 4.4 4.3 4.5 4.7  CL 106 102 101 100 96* 98 99  CO2 22 24 25 25 24 27 25   GLUCOSE 113* 136* 128* 114* 158* 132* 124*  BUN 80* 87* 94* 97* 102* 81* 91*  CREATININE 5.51* 5.81* 5.90* 6.31* 6.93* 6.12* 6.55*  CALCIUM 8.5* 8.3* 8.6* 8.5* 8.2* 8.0* 8.5*  PHOS  --   --  5.4* 5.8* 6.1* 6.0* 5.9*   CBC Recent Labs  Lab 07/21/21 0136 07/23/21 1215  WBC 6.2 7.1  HGB 9.4* 9.7*  HCT 29.2* 29.5*  MCV 83.2 83.1  PLT 315 286    Medications:     Chlorhexidine Gluconate Cloth  6 each Topical Daily   darbepoetin (ARANESP) injection - NON-DIALYSIS  100 mcg Subcutaneous Q Mon-1800   furosemide  80 mg Oral BID   heparin  5,000 Units Subcutaneous Q8H   hydrALAZINE  100 mg Oral TID   insulin aspart  0-6 Units Subcutaneous TID WC   labetalol  600 mg Oral BID   polyethylene glycol  17 g Oral BID   pravastatin  40 mg Oral Daily      Otelia Santee, MD 07/25/2021, 9:05 AM

## 2021-07-25 NOTE — Progress Notes (Signed)
[facility] PROGRESS NOTE    Progress Note  [name]  QMG:[id number] DOB: 1966/07/22 DOA: 07/15/2021 PCP: [name], MD     Brief Narrative:   [name] is an 56 y.o. male history of chronic diastolic heart failure, chronic kidney disease stage IV, referred to the ED on 07/16/2019 to from the cardiology clinic due to volume overload.  Chest x-ray confirmed pulmonary congestion with bilateral pleural effusion BNP of 600 creatinine at baseline of 4 was started on IV Lasix and metolazone nephrology was consulted due to poor improvement vascular was consulted and vascular access was placed on 07/22/2022.  Dialysis began on 07/24/2019   Assessment/Plan:   Acute on chronic diastolic CHF (congestive heart failure) [facility]): Further management per advanced heart failure team and nephrology. Cardiology concerned about amyloid involvement PYP scan to be scheduled, myeloma panel is pending  Acute respiratory failure with hypoxia due to pulmonary edema and heart failure: Has been weaned from oxygen therapy.  Chronic kidney disease stage IV: Follows with Dr. Theador Hawthorne as an outpatient.  Vascular access placed on 07/22/2021. Dialysis catheter, placed nephrology started dialysis on 07/23/2021. We also started clipping process.   Further management per renal  Chronic lower extremity wound: No evidence of infection, continue Unna boot therapy.  Essential hypertension: Continue labetalol, hydralazine.  Hyperkalemia intermittent: Renal diet has been changed  Now resolved.  Diabetes mellitus type 2: With an A1c of 7.1, blood glucose fairly controlled  Anemia of chronic renal disease: No signs of bleeding hemoglobin at 9.  Morbid obesity: He has been counseled.   DVT prophylaxis: lovenox Family Communication:none Status is: Inpatient  Remains inpatient appropriate because: Acute diastolic heart failure     Code Status:     Code Status Orders  (From  admission, onward)           Start     Ordered   07/15/21 2205  Full code  Continuous        07/15/21 2205           Code Status History     Date Active Date Inactive Code Status Order ID Comments User Context   04/16/2021 1733 04/23/2021 1447 Full Code [id number]  Orson Eva, MD ED   09/27/2020 2033 10/01/2020 1804 Full Code [id number]  Bethena Roys, MD Inpatient   06/14/2020 2046 06/21/2020 1833 Full Code [id number]  Zierle-Ghosh, [place] B, DO Inpatient   05/03/2020 1805 05/07/2020 1900 Full Code [id number]  Roney Jaffe, MD ED   04/05/2020 1852 04/10/2020 2115 Full Code [id number]  Oswald Hillock, MD ED   07/21/2011 1126 07/24/2011 1439 Full Code [id number]  Pollie Meyer, LPN Inpatient         IV Access:   Peripheral IV   Procedures and diagnostic studies:   No results found.   Medical Consultants:   None.   Subjective:    [name]  has no new complaints  Objective:    Vitals:   07/25/21 0805 07/25/21 0830 07/25/21 0900 07/25/21 0930  BP: (!) 149/81 (!) 158/83 (!) 150/79 140/81  Pulse: 84 82 79 79  Resp:  (!) 21 20 (!) 22  Temp:      TempSrc:      SpO2:      Weight:      Height:       SpO2: 95 % O2 Flow Rate (L/min): 2 L/min   Intake/Output Summary (Last 24 hours) at 07/25/2021 0957 Last data filed at 07/25/2021 6222 Gross  per 24 hour  Intake --  Output 1300 ml  Net -1300 ml    Filed Weights   07/24/21 0631 07/25/21 0500 07/25/21 0747  Weight: 123.7 kg 123.5 kg 126.1 kg    Exam: General exam: In no acute distress. Respiratory system: Good air movement and clear to auscultation. Cardiovascular system: S1 & S2 heard, RRR. No JVD. Gastrointestinal system: Abdomen is nondistended, soft and nontender.  Extremities: No pedal edema. Skin: No rashes, lesions or ulcers Psychiatry: Judgement and insight appear normal. Mood & affect appropriate. Data Reviewed:    Labs: Basic Metabolic Panel: Recent Labs  Lab 07/21/21 0136  07/22/21 0244 07/23/21 0200 07/24/21 0255 07/25/21 0429  NA 137 135 133* 136 136  K 4.6 4.4 4.3 4.5 4.7  CL 101 100 96* 98 99  CO2 25 25 24 27 25   GLUCOSE 128* 114* 158* 132* 124*  BUN 94* 97* 102* 81* 91*  CREATININE 5.90* 6.31* 6.93* 6.12* 6.55*  CALCIUM 8.6* 8.5* 8.2* 8.0* 8.5*  PHOS 5.4* 5.8* 6.1* 6.0* 5.9*    GFR Estimated Creatinine Clearance: 17 mL/min (A) (by C-G formula based on SCr of 6.55 mg/dL (H)). Liver Function Tests: Recent Labs  Lab 07/21/21 0136 07/22/21 0244 07/23/21 0200 07/24/21 0255 07/25/21 0429  ALBUMIN 2.8* 2.8* 2.8* 2.8* 2.9*    No results for input(s): LIPASE, AMYLASE in the last 168 hours. No results for input(s): AMMONIA in the last 168 hours. Coagulation profile No results for input(s): INR, PROTIME in the last 168 hours. COVID-19 Labs  No results for input(s): DDIMER, FERRITIN, LDH, CRP in the last 72 hours.   Lab Results  Component Value Date   SARSCOV2NAA NEGATIVE 07/15/2021   SARSCOV2NAA NEGATIVE 04/16/2021   [place] NEGATIVE 09/27/2020   [place] NEGATIVE 06/14/2020    CBC: Recent Labs  Lab 07/21/21 0136 07/23/21 1215  WBC 6.2 7.1  HGB 9.4* 9.7*  HCT 29.2* 29.5*  MCV 83.2 83.1  PLT 315 286    Cardiac Enzymes: No results for input(s): CKTOTAL, CKMB, CKMBINDEX, TROPONINI in the last 168 hours. BNP (last 3 results) No results for input(s): PROBNP in the last 8760 hours. CBG: Recent Labs  Lab 07/24/21 0738 07/24/21 1100 07/24/21 1612 07/24/21 2159 07/25/21 0626  GLUCAP 195* 144* 170* 143* 130*    D-Dimer: No results for input(s): DDIMER in the last 72 hours. Hgb A1c: No results for input(s): HGBA1C in the last 72 hours. Lipid Profile: No results for input(s): CHOL, HDL, LDLCALC, TRIG, CHOLHDL, LDLDIRECT in the last 72 hours. Thyroid function studies: No results for input(s): TSH, T4TOTAL, T3FREE, THYROIDAB in the last 72 hours.  Invalid input(s): FREET3 Anemia work up: No results for input(s):  VITAMINB12, FOLATE, FERRITIN, TIBC, IRON, RETICCTPCT in the last 72 hours.  Sepsis Labs: Recent Labs  Lab 07/21/21 0136 07/23/21 1215  WBC 6.2 7.1    Microbiology Recent Results (from the past 240 hour(s))  Resp Panel by RT-PCR (Flu A&B, Covid) Nasopharyngeal Swab     Status: None   Collection Time: 07/15/21  1:00 PM   Specimen: Nasopharyngeal Swab; Nasopharyngeal(NP) swabs in vial transport medium  Result Value Ref Range Status   SARS Coronavirus 2 by RT PCR NEGATIVE NEGATIVE Final    Comment: (NOTE) SARS-CoV-2 target nucleic acids are NOT DETECTED.  The SARS-CoV-2 RNA is generally detectable in upper respiratory specimens during the acute phase of infection. The lowest concentration of SARS-CoV-2 viral copies this assay can detect is 138 copies/mL. A negative result does not preclude SARS-Cov-2  infection and should not be used as the sole basis for treatment or other patient management decisions. A negative result may occur with  improper specimen collection/handling, submission of specimen other than nasopharyngeal swab, presence of viral mutation(s) within the areas targeted by this assay, and inadequate number of viral copies(<138 copies/mL). A negative result must be combined with clinical observations, patient history, and epidemiological information. The expected result is Negative.  Fact Sheet for Patients:  EntrepreneurPulse.com.au  Fact Sheet for Healthcare Providers:  IncredibleEmployment.be  This test is no t yet approved or cleared by the [place] FDA and  has been authorized for detection and/or diagnosis of SARS-CoV-2 by FDA under an Emergency Use Authorization (EUA). This EUA will remain  in effect (meaning this test can be used) for the duration of the COVID-19 declaration under Section 564(b)(1) of the Act, 21 U.S.C.section 360bbb-3(b)(1), unless the authorization is terminated  or revoked sooner.        Influenza A by PCR NEGATIVE NEGATIVE Final   Influenza B by PCR NEGATIVE NEGATIVE Final    Comment: (NOTE) The Xpert Xpress SARS-CoV-2/FLU/RSV plus assay is intended as an aid in the diagnosis of influenza from Nasopharyngeal swab specimens and should not be used as a sole basis for treatment. Nasal washings and aspirates are unacceptable for Xpert Xpress SARS-CoV-2/FLU/RSV testing.  Fact Sheet for Patients: EntrepreneurPulse.com.au  Fact Sheet for Healthcare Providers: IncredibleEmployment.be  This test is not yet approved or cleared by the [place] FDA and has been authorized for detection and/or diagnosis of SARS-CoV-2 by FDA under an Emergency Use Authorization (EUA). This EUA will remain in effect (meaning this test can be used) for the duration of the COVID-19 declaration under Section 564(b)(1) of the Act, 21 U.S.C. section 360bbb-3(b)(1), unless the authorization is terminated or revoked.  Performed at [facility], [place] [address] [place], [place] [address]   Surgical pcr screen     Status: None   Collection Time: 07/21/21  9:10 PM   Specimen: Nasal Mucosa; Nasal Swab  Result Value Ref Range Status   MRSA, PCR NEGATIVE NEGATIVE Final   Staphylococcus aureus NEGATIVE NEGATIVE Final    Comment: (NOTE) The Xpert SA Assay (FDA approved for NASAL specimens in patients 65 years of age and older), is one component of a comprehensive surveillance program. It is not intended to diagnose infection nor to guide or monitor treatment. Performed at [facility], [place] [address] [place], [place] [address]      Medications:    Chlorhexidine Gluconate Cloth  6 each Topical Daily   darbepoetin (ARANESP) injection - NON-DIALYSIS  100 mcg Subcutaneous Q Mon-1800   furosemide  80 mg Oral BID   heparin  5,000 Units Subcutaneous Q8H   hydrALAZINE  100 mg Oral TID   insulin aspart  0-6 Units Subcutaneous TID WC   labetalol  600  mg Oral BID   polyethylene glycol  17 g Oral BID   pravastatin  40 mg Oral Daily   Continuous Infusions:      LOS: 10 days   Charlynne Cousins  [facility]  07/25/2021, 9:57 AM

## 2021-07-25 NOTE — Progress Notes (Signed)
Orthopedic Tech Progress Note Patient Details:  [name] Sep 24, 1965 [id number]  MD KUSHWAHA discontinued [name] BOOTS 07/23/21 0258  Patient ID: [name], male   DOB: May 13, 1966, 56 y.o.   MRN: [id number]  [name] 07/25/2021, 2:46 PM

## 2021-07-26 LAB — RENAL FUNCTION PANEL
Albumin: 2.8 g/dL — ABNORMAL LOW (ref 3.5–5.0)
Anion gap: 11 (ref 5–15)
BUN: 67 mg/dL — ABNORMAL HIGH (ref 6–20)
CO2: 26 mmol/L (ref 22–32)
Calcium: 8.2 mg/dL — ABNORMAL LOW (ref 8.9–10.3)
Chloride: 97 mmol/L — ABNORMAL LOW (ref 98–111)
Creatinine, Ser: 5.15 mg/dL — ABNORMAL HIGH (ref 0.61–1.24)
GFR, Estimated: 12 mL/min — ABNORMAL LOW
Glucose, Bld: 150 mg/dL — ABNORMAL HIGH (ref 70–99)
Phosphorus: 5.3 mg/dL — ABNORMAL HIGH (ref 2.5–4.6)
Potassium: 4.4 mmol/L (ref 3.5–5.1)
Sodium: 134 mmol/L — ABNORMAL LOW (ref 135–145)

## 2021-07-26 LAB — HEPATITIS B CORE ANTIBODY, TOTAL: Hep B Core Total Ab: NONREACTIVE

## 2021-07-26 LAB — GLUCOSE, CAPILLARY
Glucose-Capillary: 181 mg/dL — ABNORMAL HIGH (ref 70–99)
Glucose-Capillary: 137 mg/dL — ABNORMAL HIGH (ref 70–99)
Glucose-Capillary: 128 mg/dL — ABNORMAL HIGH (ref 70–99)
Glucose-Capillary: 181 mg/dL — ABNORMAL HIGH (ref 70–99)

## 2021-07-26 MED ORDER — CALCIUM ACETATE (PHOS BINDER) 667 MG PO CAPS
667.0000 mg | ORAL_CAPSULE | Freq: Three times a day (TID) | ORAL | Status: DC
  Administered 2021-07-26 – 2021-08-02 (×20): 667 mg via ORAL
  Filled 2021-07-26 (×20): qty 1

## 2021-07-26 NOTE — Progress Notes (Signed)
Physical Therapy Treatment Patient Details Name: [name] MRN: [id number] DOB: 1966/07/22 Today's Date: 07/26/2021   History of Present Illness Pt is a 56 y.o. M who presents 07/15/2021 with volume overload. Significant PMH: chronic HFpEF, stage IV CKD, T2DM, HTN, anemia.    PT Comments    Pt reporting some dyspnea on exertion during mobility, VSS but unable to obtain SPO2 given not able to get pleth with equipment. HR stable during mobility. Pt ambulatory in hallway without AD, and proficiently performed steps with close guard from PT. PT encouraged pt OOB with mobility specialist later today. Will continue to follow.      Recommendations for follow up therapy are one component of a multi-disciplinary discharge planning process, led by the attending physician.  Recommendations may be updated based on patient status, additional functional criteria and insurance authorization.  Follow Up Recommendations  Home health PT     Assistance Recommended at Discharge PRN  Patient can return home with the following Help with stairs or ramp for entrance;Assistance with cooking/housework   Equipment Recommendations  None recommended by PT    Recommendations for Other Services       Precautions / Restrictions Precautions Precautions: Fall Precaution Comments: watch Spo2 Restrictions Weight Bearing Restrictions: No     Mobility  Bed Mobility Overal bed mobility: Modified Independent             General bed mobility comments: increased time    Transfers Overall transfer level: Needs assistance Equipment used: None Transfers: Sit to/from Stand Sit to Stand: Supervision           General transfer comment: for safety, no physical assist    Ambulation/Gait Ambulation/Gait assistance: Supervision Gait Distance (Feet): 200 Feet (x2 - intermittent standing rest breaks) Assistive device: None Gait Pattern/deviations: Step-through pattern;Decreased stride length Gait  velocity: decr     General Gait Details: cues for rest breaks as needed, DOE 2/4 but recovers with standing rest break and propping on wall   Stairs Stairs: Yes Stairs assistance: Min guard Stair Management: One rail Left;Step to pattern;Forwards Number of Stairs: 7 General stair comments: cues for step-to pattern with LLE leading ascending given RLE weakness.   Wheelchair Mobility    Modified Rankin (Stroke Patients Only)       Balance Overall balance assessment: Needs assistance Sitting-balance support: Feet supported Sitting balance-Leahy Scale: Good     Standing balance support: No upper extremity supported;During functional activity Standing balance-Leahy Scale: Fair                              Cognition Arousal/Alertness: Awake/alert Behavior During Therapy: WFL for tasks assessed/performed Overall Cognitive Status: Within Functional Limits for tasks assessed                                          Exercises      General Comments        Pertinent Vitals/Pain Pain Assessment: Faces Faces Pain Scale: No hurt    Home Living                          Prior Function            PT Goals (current goals can now be found in the care plan section) Acute Rehab PT Goals Patient Stated  Goal: be able to go out to eat with family PT Goal Formulation: With patient Time For Goal Achievement: 08/02/21 Potential to Achieve Goals: Good Progress towards PT goals: Progressing toward goals    Frequency    Min 3X/week      PT Plan Current plan remains appropriate    Co-evaluation              AM-PAC PT "6 Clicks" Mobility   Outcome Measure  Help needed turning from your back to your side while in a flat bed without using bedrails?: None Help needed moving from lying on your back to sitting on the side of a flat bed without using bedrails?: None Help needed moving to and from a bed to a chair (including a  wheelchair)?: None Help needed standing up from a chair using your arms (e.g., wheelchair or bedside chair)?: None Help needed to walk in hospital room?: A Little Help needed climbing 3-5 steps with a railing? : A Little 6 Click Score: 22    End of Session   Activity Tolerance: Patient tolerated treatment well Patient left: in bed;with call bell/phone within reach;with bed alarm set   PT Visit Diagnosis: Unsteadiness on feet (R26.81);Difficulty in walking, not elsewhere classified (R26.2)     Time: 7654-6503 PT Time Calculation (min) (ACUTE ONLY): 12 min  Charges:  $Gait Training: 8-22 mins                     Stacie Glaze, PT DPT Acute Rehabilitation Services Pager [phone]  Office [phone]    [place] 07/26/2021, 12:09 PM

## 2021-07-26 NOTE — Plan of Care (Signed)
°  Problem: Education: Goal: Knowledge of General Education information will improve Description: Including pain rating scale, medication(s)/side effects and non-pharmacologic comfort measures Outcome: Progressing   Problem: Health Behavior/Discharge Planning: Goal: Ability to manage health-related needs will improve Outcome: Progressing   Problem: Clinical Measurements: Goal: Ability to maintain clinical measurements within normal limits will improve Outcome: Progressing Goal: Will remain free from infection Outcome: Progressing Goal: Diagnostic test results will improve Outcome: Progressing Goal: Respiratory complications will improve Outcome: Progressing Goal: Cardiovascular complication will be avoided Outcome: Progressing   Problem: Activity: Goal: Risk for activity intolerance will decrease Outcome: Progressing   Problem: Pain Managment: Goal: General experience of comfort will improve Outcome: Progressing   Problem: Safety: Goal: Ability to remain free from injury will improve Outcome: Progressing   Problem: Skin Integrity: Goal: Risk for impaired skin integrity will decrease Outcome: Progressing   Problem: Education: Goal: Ability to demonstrate management of disease process will improve Outcome: Progressing Goal: Ability to verbalize understanding of medication therapies will improve Outcome: Progressing Goal: Individualized Educational Video(s) Outcome: Progressing   Problem: Activity: Goal: Capacity to carry out activities will improve Outcome: Progressing   Problem: Cardiac: Goal: Ability to achieve and maintain adequate cardiopulmonary perfusion will improve Outcome: Progressing

## 2021-07-26 NOTE — Plan of Care (Signed)
  Problem: Education: Goal: Knowledge of General Education information will improve Description: Including pain rating scale, medication(s)/side effects and non-pharmacologic comfort measures Outcome: Progressing   Problem: Health Behavior/Discharge Planning: Goal: Ability to manage health-related needs will improve Outcome: Progressing   Problem: Clinical Measurements: Goal: Ability to maintain clinical measurements within normal limits will improve Outcome: Progressing   Problem: Clinical Measurements: Goal: Respiratory complications will improve Outcome: Progressing   Problem: Clinical Measurements: Goal: Cardiovascular complication will be avoided Outcome: Progressing   Problem: Activity: Goal: Risk for activity intolerance will decrease Outcome: Progressing   Problem: Pain Managment: Goal: General experience of comfort will improve Outcome: Progressing   Problem: Safety: Goal: Ability to remain free from injury will improve Outcome: Progressing   Problem: Skin Integrity: Goal: Risk for impaired skin integrity will decrease Outcome: Progressing   Problem: Cardiac: Goal: Ability to achieve and maintain adequate cardiopulmonary perfusion will improve Outcome: Progressing   

## 2021-07-26 NOTE — Progress Notes (Addendum)
Requested to see pt for out-pt HD arrangements. Case discussed with Dr Augustin Coupe. Met with pt at bedside. Discussed out-pt HD placement process. Pt informed navigator that his insurance changed as of July 24, 2021. Pt did not have a preference for clinic as long as in [place] and accepts insurance. Pt states to contact wife regarding details of new insurance. Spoke to pt's wife via phone. Wife provided pt's new insurance info ([employer] Insurance ID [id number]/POLID # [id number]). Spoke to wife regarding clinic preference. Wife felt it a good idea to proceed with [facility] since pt sees Dr Theador Hawthorne as an out-pt. Referral made to [facility] admissions this morning. Contacted nephrologist to request Hep B Total Core antibody lab which is required by [facility] for new admissions. MD to order. Pt states he drives but that family/friends plan to assist with transportation as needed. Will assist with out-pt HD arrangements.   Melven Sartorius Renal Navigator [phone]  Addendum at 3:38 pm: Contacted [facility] Admissions to f/u on pt's referral. Advised that referral was received and being processed. Will fax Hep B total care antibody lab result as soon as it is available. [facility] cannot proceed with referral until lab result received.

## 2021-07-26 NOTE — Progress Notes (Signed)
[facility] PROGRESS NOTE    Progress Note  [name]  HGD:[id number] DOB: 1965-08-28 DOA: 07/15/2021 PCP: [name], MD     Brief Narrative:   [name] is an 56 y.o. male history of chronic diastolic heart failure, chronic kidney disease stage IV, referred to the ED on 07/16/2019 to from the cardiology clinic due to volume overload.  Chest x-ray confirmed pulmonary congestion with bilateral pleural effusion BNP of 600 creatinine at baseline of 4 was started on IV Lasix and metolazone nephrology was consulted due to poor improvement in urine output, vascular was consulted and vascular access was placed on 07/22/2022.  Dialysis began on 07/24/2019   Assessment/Plan:   Acute on chronic diastolic CHF (congestive heart failure) [facility]): Further management per advanced heart failure team and nephrology. Cardiology concerned about amyloid involvement PYP scan to be scheduled, myeloma panel is pending. Moderate urine output for the last 2 days question of his renal function is improving.  Acute respiratory failure with hypoxia due to pulmonary edema and heart failure: Has been weaned from oxygen therapy.  Chronic kidney disease stage IV: Follows with Dr. Theador Hawthorne as an outpatient.  Vascular access and fistula placed on 07/22/2021. Urine output is improving. Dialysis catheter, placed nephrology started dialysis on 07/23/2021. Clipping process for outpatient dialysis started on Tuesday For dialysis 07/27/2021 Further management per renal.  Chronic lower extremity wound: No evidence of infection, continue Unna boot therapy. Will have to change will have to be changed weekly  Essential hypertension: Continue labetalol, hydralazine.  Hyperkalemia intermittent: Renal diet has been changed  Now resolved.  Diabetes mellitus type 2: With an A1c of 7.1, blood glucose fairly controlled  Anemia of chronic renal disease: No signs of bleeding hemoglobin at 9.  Morbid  obesity: He has been counseled.   DVT prophylaxis: lovenox Family Communication:none Status is: Inpatient  Remains inpatient appropriate because: Acute diastolic heart failure     Code Status:     Code Status Orders  (From admission, onward)           Start     Ordered   07/15/21 2205  Full code  Continuous        07/15/21 2205           Code Status History     Date Active Date Inactive Code Status Order ID Comments User Context   04/16/2021 1733 04/23/2021 1447 Full Code [id number]  Orson Eva, MD ED   09/27/2020 2033 10/01/2020 1804 Full Code [id number]  Bethena Roys, MD Inpatient   06/14/2020 2046 06/21/2020 1833 Full Code [id number]  Zierle-Ghosh, [place] B, DO Inpatient   05/03/2020 1805 05/07/2020 1900 Full Code [id number]  Roney Jaffe, MD ED   04/05/2020 1852 04/10/2020 2115 Full Code [id number]  Oswald Hillock, MD ED   07/21/2011 1126 07/24/2011 1439 Full Code [id number]  Pollie Meyer, LPN Inpatient         IV Access:   Peripheral IV   Procedures and diagnostic studies:   No results found.   Medical Consultants:   None.   Subjective:    [name] feels good relates he is ready to go home, still short of breath with ambulation  Objective:    Vitals:   07/25/21 2211 07/26/21 0547 07/26/21 0605 07/26/21 0840  BP: (!) 146/78 (!) 153/83 (!) 153/83 (!) 152/78  Pulse: 81  82 84  Resp: 17  18   Temp: 98.4 F (36.9 C)  98.3 F (36.8 C)  TempSrc: Oral  Oral   SpO2: 98%  99%   Weight:   121.2 kg   Height:       SpO2: 99 % O2 Flow Rate (L/min): 2 L/min   Intake/Output Summary (Last 24 hours) at 07/26/2021 1027 Last data filed at 07/26/2021 0500 Gross per 24 hour  Intake 120 ml  Output 2870 ml  Net -2750 ml    Filed Weights   07/25/21 0747 07/25/21 1135 07/26/21 0605  Weight: 126.1 kg 124.6 kg 121.2 kg    Exam: General exam: In no acute distress. Respiratory system: Good air movement and clear to  auscultation. Cardiovascular system: S1 & S2 heard, RRR. No JVD. Gastrointestinal system: Abdomen is nondistended, soft and nontender.  Extremities: No pedal edema. Skin: Unna boots in place Psychiatry: Judgement and insight appear normal. Mood & affect appropriate. Data Reviewed:    Labs: Basic Metabolic Panel: Recent Labs  Lab 07/22/21 0244 07/23/21 0200 07/24/21 0255 07/25/21 0429 07/26/21 0311  NA 135 133* 136 136 134*  K 4.4 4.3 4.5 4.7 4.4  CL 100 96* 98 99 97*  CO2 25 24 27 25 26   GLUCOSE 114* 158* 132* 124* 150*  BUN 97* 102* 81* 91* 67*  CREATININE 6.31* 6.93* 6.12* 6.55* 5.15*  CALCIUM 8.5* 8.2* 8.0* 8.5* 8.2*  PHOS 5.8* 6.1* 6.0* 5.9* 5.3*    GFR Estimated Creatinine Clearance: 21.2 mL/min (A) (by C-G formula based on SCr of 5.15 mg/dL (H)). Liver Function Tests: Recent Labs  Lab 07/22/21 0244 07/23/21 0200 07/24/21 0255 07/25/21 0429 07/26/21 0311  ALBUMIN 2.8* 2.8* 2.8* 2.9* 2.8*    No results for input(s): LIPASE, AMYLASE in the last 168 hours. No results for input(s): AMMONIA in the last 168 hours. Coagulation profile No results for input(s): INR, PROTIME in the last 168 hours. COVID-19 Labs  No results for input(s): DDIMER, FERRITIN, LDH, CRP in the last 72 hours.   Lab Results  Component Value Date   SARSCOV2NAA NEGATIVE 07/15/2021   SARSCOV2NAA NEGATIVE 04/16/2021   [place] NEGATIVE 09/27/2020   [place] NEGATIVE 06/14/2020    CBC: Recent Labs  Lab 07/21/21 0136 07/23/21 1215  WBC 6.2 7.1  HGB 9.4* 9.7*  HCT 29.2* 29.5*  MCV 83.2 83.1  PLT 315 286    Cardiac Enzymes: No results for input(s): CKTOTAL, CKMB, CKMBINDEX, TROPONINI in the last 168 hours. BNP (last 3 results) No results for input(s): PROBNP in the last 8760 hours. CBG: Recent Labs  Lab 07/24/21 2159 07/25/21 0626 07/25/21 1655 07/25/21 2238 07/26/21 0716  GLUCAP 143* 130* 148* 160* 181*    D-Dimer: No results for input(s): DDIMER in the last 72  hours. Hgb A1c: No results for input(s): HGBA1C in the last 72 hours. Lipid Profile: No results for input(s): CHOL, HDL, LDLCALC, TRIG, CHOLHDL, LDLDIRECT in the last 72 hours. Thyroid function studies: No results for input(s): TSH, T4TOTAL, T3FREE, THYROIDAB in the last 72 hours.  Invalid input(s): FREET3 Anemia work up: No results for input(s): VITAMINB12, FOLATE, FERRITIN, TIBC, IRON, RETICCTPCT in the last 72 hours.  Sepsis Labs: Recent Labs  Lab 07/21/21 0136 07/23/21 1215  WBC 6.2 7.1    Microbiology Recent Results (from the past 240 hour(s))  Surgical pcr screen     Status: None   Collection Time: 07/21/21  9:10 PM   Specimen: Nasal Mucosa; Nasal Swab  Result Value Ref Range Status   MRSA, PCR NEGATIVE NEGATIVE Final   Staphylococcus aureus NEGATIVE NEGATIVE Final    Comment: (NOTE)  The Xpert SA Assay (FDA approved for NASAL specimens in patients 78 years of age and older), is one component of a comprehensive surveillance program. It is not intended to diagnose infection nor to guide or monitor treatment. Performed at [facility], [place] [address] [place], [place] [address]      Medications:    calcium acetate  667 mg Oral TID WC   Chlorhexidine Gluconate Cloth  6 each Topical Daily   darbepoetin (ARANESP) injection - NON-DIALYSIS  100 mcg Subcutaneous Q Mon-1800   furosemide  80 mg Oral BID   heparin  5,000 Units Subcutaneous Q8H   hydrALAZINE  100 mg Oral TID   insulin aspart  0-6 Units Subcutaneous TID WC   labetalol  600 mg Oral BID   pravastatin  40 mg Oral Daily   Continuous Infusions:      LOS: 11 days   Charlynne Cousins  [facility]  07/26/2021, 10:27 AM

## 2021-07-26 NOTE — Progress Notes (Signed)
Orthopedic Tech Progress Note Patient Details:  [name] July 07, 1966 [id number]  RN failed to remove old UNNA BOOTS so I removed and cleanse patient legs and reapplied new UNNA BOOTS   Ortho Devices Type of Ortho Device: Haematologist Ortho Device/Splint Location: BLE Ortho Device/Splint Interventions: Removal, Application, Ordered   Post Interventions Patient Tolerated: Well Instructions Provided: [facility] [place] 07/26/2021, 7:05 PM

## 2021-07-26 NOTE — Progress Notes (Signed)
Orthopedic Tech Progress Note Patient Details:  [name] 1966-05-15 [id number]  RN called requesting for a new pair of UNNA BOOTS, old ones has not been remove, will return once they are off. Material at bedside  Patient ID: [name], male   DOB: April 29, 1966, 56 y.o.   MRN: [id number]  [name] 07/26/2021, 6:04 PM

## 2021-07-26 NOTE — Progress Notes (Signed)
[facility] Progress Note   56 y.o. 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  Dr. Haroldine Laws rx  Lasix 160 mg IV every 6 hours plus metolazone 5 mg daily with the knowledge that he may need to start dialysis if he does not respond.  Cardiology is also planning to rule out amyloid as well.    He follows with Dr. Theador Hawthorne.  Per nephrology notes his metolazone was increased to 5 mg three times a week at the 06/08/21 office appointment.    Assessment/ Plan:   1)  Acute on chronic diastolic CHF  -Given we have started dialysis we will switch Lasix to 80 mg twice daily as long as UOP is good -Management of fluid with dialysis at this time   2)  AKI on CKD stage IV.  Now ESRD - Cr was 3.74 with GFR 18 in November 2022 and in September GFR below 15 - holding home lisinopril and amlodipine - He follows with an outside practice, Dr. Theador Hawthorne [facility] -  needs access-patient has been hesitant to undergo dialysis and receive access placement.  Now agreed and s/p AVF creation and TDC placement on 12/30; appreciate help from VVS  Last HD on 1/2  net UF 1.5L  Started CLIP process on Tuesday; renal navigator knows about the pt  Next HD tomorrow; still volume overloaded    3)  HTN  - optimize volume status with diuretics and dialysis as above- - continue current medications   4)  OSA - note decreased respiratory reserve   5) DM type 2 - per primary team    6)  Anemia-  iron stores just a little low, will replete and have added ESA -  Hgb 9.7   7) Hyperkalemia-  resolved 8) Renal osteodystrophy - phos mildly elevated but almost certainly will continue to incr - start phoslo 1 TIDM  Subjective:   Feels breathing is better. Denies f/c/n/v/cp.   Objective:   BP (!) 152/78    Pulse 84    Temp  98.3 F (36.8 C) (Oral)    Resp 18    Ht 5\' 11"  (1.803 m)    Wt 121.2 kg    SpO2 99%    BMI 37.27 kg/m   Intake/Output Summary (Last 24 hours) at 07/26/2021 0856 Last data filed at 07/26/2021 0500 Gross per 24 hour  Intake 120 ml  Output 2870 ml  Net -2750 ml   Weight change: 2.631 kg  Physical Exam: General: Supine in  bed, nad HEENT: NCAT Heart: normal rate Lungs: Bilateral chest rise, no increased work of breathing Abdomen: S, obese habitus Extremities: 1-2+ pitting edema bilateral lower extremities, UNNA boots on Skin: chronic venous stasis changes Neuro: alert and oriented x 3 provides hx and follows commands GU no foley Access: RIJ TC + rt CImino (+thrill)    Imaging: No results found.  Labs: BMET Recent Labs  Lab 07/20/21 0427 07/21/21 0136 07/22/21 0244 07/23/21 0200 07/24/21 0255 07/25/21 0429 07/26/21 0311  NA 137 137 135 133* 136 136 134*  K 4.9 4.6 4.4 4.3 4.5 4.7 4.4  CL 102 101 100 96* 98 99 97*  CO2 24 25 25 24 27 25 26   GLUCOSE 136* 128* 114* 158* 132* 124* 150*  BUN 87* 94* 97* 102* 81* 91* 67*  CREATININE 5.81*  5.90* 6.31* 6.93* 6.12* 6.55* 5.15*  CALCIUM 8.3* 8.6* 8.5* 8.2* 8.0* 8.5* 8.2*  PHOS  --  5.4* 5.8* 6.1* 6.0* 5.9* 5.3*   CBC Recent Labs  Lab 07/21/21 0136 07/23/21 1215  WBC 6.2 7.1  HGB 9.4* 9.7*  HCT 29.2* 29.5*  MCV 83.2 83.1  PLT 315 286    Medications:     Chlorhexidine Gluconate Cloth  6 each Topical Daily   darbepoetin (ARANESP) injection - NON-DIALYSIS  100 mcg Subcutaneous Q Mon-1800   furosemide  80 mg Oral BID   heparin  5,000 Units Subcutaneous Q8H   hydrALAZINE  100 mg Oral TID   insulin aspart  0-6 Units Subcutaneous TID WC   labetalol  600 mg Oral BID   polyethylene glycol  17 g Oral BID   pravastatin  40 mg Oral Daily      Otelia Santee, MD 07/26/2021, 8:56 AM

## 2021-07-26 NOTE — Progress Notes (Signed)
Mobility Specialist Progress Note    07/26/21 1608  Mobility  Activity Ambulated in hall  Level of Assistance Standby assist, set-up cues, supervision of patient - no hands on  Assistive Device None  Distance Ambulated (ft) 360 ft  Mobility Ambulated independently in hallway  Mobility Response Tolerated well  Mobility performed by Mobility specialist  $Mobility charge 1 Mobility   Pt received in bed and agreeable. No complaints on walk. Left sitting EOB with call bell in reach.   [facility] Mobility Specialist  M.S. Primary Phone: [phone] M.S. Secondary Phone: [phone]

## 2021-07-27 LAB — RENAL FUNCTION PANEL
Albumin: 2.9 g/dL — ABNORMAL LOW (ref 3.5–5.0)
Anion gap: 12 (ref 5–15)
BUN: 79 mg/dL — ABNORMAL HIGH (ref 6–20)
CO2: 25 mmol/L (ref 22–32)
Calcium: 8.5 mg/dL — ABNORMAL LOW (ref 8.9–10.3)
Chloride: 98 mmol/L (ref 98–111)
Creatinine, Ser: 5.62 mg/dL — ABNORMAL HIGH (ref 0.61–1.24)
GFR, Estimated: 11 mL/min — ABNORMAL LOW (ref >60)
Glucose, Bld: 121 mg/dL — ABNORMAL HIGH (ref 70–99)
Phosphorus: 5.4 mg/dL — ABNORMAL HIGH (ref 2.5–4.6)
Potassium: 4.6 mmol/L (ref 3.5–5.1)
Sodium: 135 mmol/L (ref 135–145)

## 2021-07-27 LAB — CBC
HCT: 31 % — ABNORMAL LOW (ref 39.0–52.0)
Hemoglobin: 9.8 g/dL — ABNORMAL LOW (ref 13.0–17.0)
MCH: 26.7 pg (ref 26.0–34.0)
MCHC: 31.6 g/dL (ref 30.0–36.0)
MCV: 84.5 fL (ref 80.0–100.0)
Platelets: 320 10*3/uL (ref 150–400)
RBC: 3.67 MIL/uL — ABNORMAL LOW (ref 4.22–5.81)
RDW: 14.7 % (ref 11.5–15.5)
WBC: 6.3 10*3/uL (ref 4.0–10.5)
nRBC: 0 % (ref 0.0–0.2)

## 2021-07-27 LAB — IMMUNOFIXATION, URINE

## 2021-07-27 LAB — GLUCOSE, CAPILLARY
Glucose-Capillary: 99 mg/dL (ref 70–99)
Glucose-Capillary: 180 mg/dL — ABNORMAL HIGH (ref 70–99)
Glucose-Capillary: 109 mg/dL — ABNORMAL HIGH (ref 70–99)

## 2021-07-27 MED ORDER — HEPARIN SODIUM (PORCINE) 1000 UNIT/ML IJ SOLN
3800.0000 [IU] | INTRAMUSCULAR | Status: DC
  Administered 2021-07-29: 3800 [IU] via INTRAVENOUS
  Filled 2021-07-27 (×2): qty 4
  Filled 2021-07-27 (×2): qty 3.8

## 2021-07-27 MED ORDER — HEPARIN SODIUM (PORCINE) 1000 UNIT/ML IJ SOLN
INTRAMUSCULAR | Status: AC
  Administered 2021-07-27: 3800 [IU] via INTRAVENOUS
  Filled 2021-07-27: qty 4

## 2021-07-27 MED ORDER — KIDNEY FAILURE BOOK: Freq: Once | Status: AC

## 2021-07-27 NOTE — Progress Notes (Signed)
Hemodialysis- Tolerated well without issue. 3L removed as ordered. Reported off to primary RN.

## 2021-07-27 NOTE — Progress Notes (Signed)
Mobility Specialist Progress Note    07/27/21 1518  Mobility  Activity Ambulated in hall  Level of Assistance Contact guard assist, steadying assist  Assistive Device None  Distance Ambulated (ft) 360 ft  Mobility Ambulated with assistance in hallway  Mobility Response Tolerated fair  Mobility performed by Mobility specialist  $Mobility charge 1 Mobility   Pt received in bed and agreeable. Had LOB x2 but recovered himself and c/o having some dizziness. Returned to bed with call bell in reach.   [facility] Mobility Specialist  M.S. Primary Phone: [phone] M.S. Secondary Phone: [phone]

## 2021-07-27 NOTE — Progress Notes (Signed)
[facility] Progress Note   56 y.o. 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  Dr. Haroldine Laws rx  Lasix 160 mg IV every 6 hours plus metolazone 5 mg daily with the knowledge that he may need to start dialysis if he does not respond.  Cardiology is also planning to rule out amyloid as well.    He follows with Dr. Theador Hawthorne.  Per nephrology notes his metolazone was increased to 5 mg three times a week at the 06/08/21 office appointment.    Assessment/ Plan:   1)  Acute on chronic diastolic CHF  -Given we have started dialysis we will switch Lasix to 80 mg twice daily as long as UOP is good -Management of fluid with dialysis at this time   2)  AKI on CKD stage IV.  Now ESRD - Cr was 3.74 with GFR 18 in November 2022 and in September GFR below 15 - He follows with an outside practice, Dr. Theador Hawthorne [facility] -  needs access-patient has been hesitant to undergo dialysis and receive access placement.  Now agreed and s/p AVF creation and TDC placement on 12/30; appreciate help from VVS  Last HD on 1/2  net UF 1.5L  Started CLIP process on Tuesday; renal navigator awaiting final approval.  Seen on  HD  146/78 1.5L net UF goal 3K bath + UNNA boots changed last night.   3)  HTN  - optimize volume status with diuretics and dialysis as above- - continue current medications   4)  OSA - note decreased respiratory reserve   5) DM type 2 - per primary team    6)  Anemia-  iron stores just a little low, will replete and have added ESA -  Hgb 9.7   7) Hyperkalemia-  resolved 8) Renal osteodystrophy - phos mildly elevated but almost certainly will continue to incr - start phoslo 1 TIDM  Subjective:   Feels breathing is better. Denies f/c/n/v/cp. UNNA boots changed last night   Objective:   BP  (!) 146/78    Pulse 82    Temp 98.7 F (37.1 C) (Oral)    Resp 16    Ht 5\' 11"  (1.803 m)    Wt 121.3 kg    SpO2 97%    BMI 37.30 kg/m   Intake/Output Summary (Last 24 hours) at 07/27/2021 1015 Last data filed at 07/27/2021 0500 Gross per 24 hour  Intake 240 ml  Output 1600 ml  Net -1360 ml   Weight change: -4.717 kg  Physical Exam: General: Supine in  bed, nad HEENT: NCAT Heart: normal rate Lungs: Bilateral chest rise, no increased work of breathing Abdomen: S, obese habitus Extremities: 1-2+ pitting edema bilateral lower extremities, UNNA boots on Skin: chronic venous stasis changes Neuro: alert and oriented x 3 provides hx and follows commands GU no foley Access: RIJ TC + rt CImino (+thrill)    Imaging: No results found.  Labs: BMET Recent Labs  Lab 07/21/21 0136 07/22/21 0244 07/23/21 0200 07/24/21 0255 07/25/21 0429 07/26/21 0311 07/27/21 0323  NA 137 135 133* 136 136 134* 135  K 4.6 4.4 4.3 4.5 4.7 4.4 4.6  CL 101 100 96* 98 99 97* 98  CO2 25 25 24 27 25 26 25   GLUCOSE 128* 114* 158* 132* 124* 150* 121*  BUN 94*  97* 102* 81* 91* 67* 79*  CREATININE 5.90* 6.31* 6.93* 6.12* 6.55* 5.15* 5.62*  CALCIUM 8.6* 8.5* 8.2* 8.0* 8.5* 8.2* 8.5*  PHOS 5.4* 5.8* 6.1* 6.0* 5.9* 5.3* 5.4*   CBC Recent Labs  Lab 07/21/21 0136 07/23/21 1215 07/27/21 0648  WBC 6.2 7.1 6.3  HGB 9.4* 9.7* 9.8*  HCT 29.2* 29.5* 31.0*  MCV 83.2 83.1 84.5  PLT 315 286 320    Medications:     calcium acetate  667 mg Oral TID WC   Chlorhexidine Gluconate Cloth  6 each Topical Daily   darbepoetin (ARANESP) injection - NON-DIALYSIS  100 mcg Subcutaneous Q Mon-1800   furosemide  80 mg Oral BID   heparin  5,000 Units Subcutaneous Q8H   heparin sodium (porcine)       heparin sodium (porcine)  3,800 Units Intravenous Q M,W,F-HD   hydrALAZINE  100 mg Oral TID   insulin aspart  0-6 Units Subcutaneous TID WC   labetalol  600 mg Oral BID   pravastatin  40 mg Oral Daily      Otelia Santee,  MD 07/27/2021, 10:15 AM

## 2021-07-27 NOTE — Progress Notes (Signed)
Rounded on patient today in correlation to transition to outpatient HD. Ordered Kidney Failure book. Patient educated at the bedside regarding care of tunneled dialysis catheter, AV fistula care, assessment of thrill daily and proper medication administration on HD days.  Patient also educated on the importance of adhering to scheduled dialysis treatments, the effects of fluid overload, hyperkalemia and hyperphosphatemia. Patient capable of re-verbalizing via teach back method. Also educated patient on services available through the interdisciplinary team in the clinic setting. Patient reports that his wife is adamant about ensuring that he follows his diet restrictions and that she will be glad to have this information. Patient with no further questions at this time. Handouts and contact information provided to patient for any further assistance. Will follow as appropriate.   Dorthey Sawyer, RN  Dialysis Nurse Coordinator Phone: [phone]

## 2021-07-27 NOTE — Progress Notes (Signed)
To HD via bed.

## 2021-07-27 NOTE — Progress Notes (Addendum)
Hep B Total Core antibody faxed to [facility] this am. Spoke to admissions this am to verify that representative was able to obtain needed insurance info from pt's wife yesterday. Made staff aware that needed lab was faxed this am. Will await final approval for out-pt clinic.  Melven Sartorius Renal Navigator [phone]   Addendum at 3:00 pm: Contacted [facility]. Pt's referral is still pending. Attempted to reach pt to provide update. Message left on pt's cell number.

## 2021-07-27 NOTE — Progress Notes (Signed)
PROGRESS NOTE  [name]  QIH:[id number] DOB: [birth year] DOA: 07/15/2021 PCP: [name], MD  Outpatient Specialists: Cardiology, Dr. Haroldine Laws Brief Narrative: [name] is a 56 y.o. male with a history of chronic HFpEF, stage IV CKD, T2DM, HTN, anemia who was referred to the ED 12/23 from the cardiology clinic due to volume overload. He had experienced >20 lbs weight gain, appeared with significant dependent edema and dyspnea with CXR confirming pulmonary congestion and pleural effusion. BNP 620, Cr near baseline at 4.1, K 5.4. IV lasix and oral metolazone given with rising creatinine prompting nephrology consultation and initiation of hemodialysis thru HD catheter placed this admission. Routine HD continues, awaiting outpatient hemodialysis chair prior to discharge.   Assessment & Plan: Principal Problem:   Acute on chronic diastolic CHF (congestive heart failure) (HCC) Active Problems:   Essential hypertension   Uncontrolled type 2 diabetes mellitus with hyperglycemia (HCC)   CKD (chronic kidney disease) stage 4, GFR 15-29 ml/min (HCC)   AKI (acute kidney injury) ([place])   Acute exacerbation of CHF (congestive heart failure) ([place])  Acute on chronic HFpEF: Echo Sept 2022 w/LVEF 55-60%, G2DD. Remains grossly volume overloaded.  - Managing volume with HD, and augmented lasix to 80mg  po BID.  - Continue I/O monitoring, weight checks. Wt down to further today to 260lbs (DC weight previously 278lbs) - Work up for cardiac amyloidosis per cardiology. PYP scat to assess for TTR amyloidosis. Myeloma panel pending  Acute hypoxic respiratory failure due to pulmonary edema due to acute CHF as above: Resolved.  AKI on stage IV CKD > now ESRD: - Continue HD thru [facility] 12/30. AVF creation also performed at that time by Dr. Trula Slade.  - CLIP process pending.  - Continue outpatient follow up with Dr. Theador Hawthorne.   Chronic LLE wound: for several months after scratching dry area. No  evidence of infection, suspect diabetes and swelling are contributing to poor wound healing.  - Unna boots to be continued. No need for abx currently.   HTN:  - Continue labetalol 600mg  BID, hydralazine 100mg  TID. Holding lisinopril, norvasc.   T2DM: Fairly well-controlled with HbA1c 7.1%.  - Continue SSI.  - Holding home glimepiride - Continue statin  OSA: Normal RV on echo. - CPAP qHS has been declined every night since arrival. He does not wear this at home. Diminishes pulmonary reserve.   AOCKD and mild iron deficiency: - Replacing iron stores, nephrology added ESA. Hgb stable.   Obesity: Estimated body mass index is 36.37 kg/m as calculated from the following:   Height as of this encounter: 5\' 11"  (1.803 m).   Weight as of this encounter: 118.3 kg. - BMI has "improved" with volume removal  Hyperkalemia: Resolved.  DVT prophylaxis: Heparin [place] Code Status: Full Family Communication: None at bedside Disposition Plan:  Status is: Inpatient  Remains inpatient appropriate because: Needs outpatient hemodialysis spot.  Consultants:  Cardiology Nephrology Vascular surgery  Procedures:   07/22/21 RIGHT  ARM FISTULA Serafina Mitchell, MD    Antimicrobials: None   Subjective: Feeling better with dialysis, slight cramping in legs afterward that resolved without medication. Swelling continues to improve and he's getting up more easily. No chest pain, bleeding or fever.   Objective: Vitals:   07/27/21 1000 07/27/21 1030 07/27/21 1038 07/27/21 1040  BP: (!) 146/83 (!) 151/82 (!) 151/82 (!) 157/85  Pulse:      Resp:    15  Temp:    98.4 F (36.9 C)  TempSrc:  Oral  SpO2:   98% 98%  Weight:   118.3 kg   Height:        Intake/Output Summary (Last 24 hours) at 07/27/2021 1438 Last data filed at 07/27/2021 1300 Gross per 24 hour  Intake 480 ml  Output 4600 ml  Net -4120 ml   Filed Weights   07/27/21 0458 07/27/21 0700 07/27/21 1038  Weight: 121.4 kg 121.3 kg 118.3  kg   Gen: 56 y.o. male in no distress Pulm: Nonlabored breathing room air. Clear. CV: Regular rate and rhythm. No murmur, rub, or gallop. No JVD, unna boots, with trace pitting dependent edema otherwise. GI: Abdomen soft, non-tender, non-distended, with normoactive bowel sounds.  Ext: Warm, no deformities Skin: No rashes, lesions or ulcers on visualized skin. RUE AVF + thrill, R TDC nontender without exudate or erythema.  Neuro: Alert and oriented. No focal neurological deficits. Psych: Judgement and insight appear fair. Mood euthymic & affect congruent. Behavior is appropriate.    Data Reviewed: I have personally reviewed following labs and imaging studies  CBC: Recent Labs  Lab 07/21/21 0136 07/23/21 1215 07/27/21 0648  WBC 6.2 7.1 6.3  HGB 9.4* 9.7* 9.8*  HCT 29.2* 29.5* 31.0*  MCV 83.2 83.1 84.5  PLT 315 286 671   Basic Metabolic Panel: Recent Labs  Lab 07/23/21 0200 07/24/21 0255 07/25/21 0429 07/26/21 0311 07/27/21 0323  NA 133* 136 136 134* 135  K 4.3 4.5 4.7 4.4 4.6  CL 96* 98 99 97* 98  CO2 24 27 25 26 25   GLUCOSE 158* 132* 124* 150* 121*  BUN 102* 81* 91* 67* 79*  CREATININE 6.93* 6.12* 6.55* 5.15* 5.62*  CALCIUM 8.2* 8.0* 8.5* 8.2* 8.5*  PHOS 6.1* 6.0* 5.9* 5.3* 5.4*   GFR: Estimated Creatinine Clearance: 19.2 mL/min (A) (by C-G formula based on SCr of 5.62 mg/dL (H)). Liver Function Tests: Recent Labs  Lab 07/23/21 0200 07/24/21 0255 07/25/21 0429 07/26/21 0311 07/27/21 0323  ALBUMIN 2.8* 2.8* 2.9* 2.8* 2.9*   No results for input(s): LIPASE, AMYLASE in the last 168 hours. No results for input(s): AMMONIA in the last 168 hours. Coagulation Profile: No results for input(s): INR, PROTIME in the last 168 hours. Cardiac Enzymes: No results for input(s): CKTOTAL, CKMB, CKMBINDEX, TROPONINI in the last 168 hours. BNP (last 3 results) No results for input(s): PROBNP in the last 8760 hours. HbA1C: No results for input(s): HGBA1C in the last 72  hours.  CBG: Recent Labs  Lab 07/26/21 0716 07/26/21 1105 07/26/21 1634 07/26/21 2108 07/27/21 1115  GLUCAP 181* 137* 128* 181* 99   Lipid Profile: No results for input(s): CHOL, HDL, LDLCALC, TRIG, CHOLHDL, LDLDIRECT in the last 72 hours. Thyroid Function Tests: No results for input(s): TSH, T4TOTAL, FREET4, T3FREE, THYROIDAB in the last 72 hours.  Anemia Panel: No results for input(s): VITAMINB12, FOLATE, FERRITIN, TIBC, IRON, RETICCTPCT in the last 72 hours.  Urine analysis:    Component Value Date/Time   COLORURINE YELLOW 06/14/2020 [place] 06/14/2020 1452   LABSPEC 1.010 06/14/2020 1452   PHURINE 5.0 06/14/2020 1452   GLUCOSEU NEGATIVE 06/14/2020 1452   HGBUR NEGATIVE 06/14/2020 1452   BILIRUBINUR NEGATIVE 06/14/2020 1452   KETONESUR NEGATIVE 06/14/2020 1452   PROTEINUR 100 (A) 06/14/2020 1452   NITRITE NEGATIVE 06/14/2020 1452   LEUKOCYTESUR NEGATIVE 06/14/2020 1452   Recent Results (from the past 240 hour(s))  Surgical pcr screen     Status: None   Collection Time: 07/21/21  9:10 PM  Specimen: Nasal Mucosa; Nasal Swab  Result Value Ref Range Status   MRSA, PCR NEGATIVE NEGATIVE Final   Staphylococcus aureus NEGATIVE NEGATIVE Final    Comment: (NOTE) The Xpert SA Assay (FDA approved for NASAL specimens in patients 75 years of age and older), is one component of a comprehensive surveillance program. It is not intended to diagnose infection nor to guide or monitor treatment. Performed at [facility], [place] [address], [place], [place] [address]       Radiology Studies: No results found.  Scheduled Meds:  calcium acetate  667 mg Oral TID WC   Chlorhexidine Gluconate Cloth  6 each Topical Daily   darbepoetin (ARANESP) injection - NON-DIALYSIS  100 mcg Subcutaneous Q Mon-1800   furosemide  80 mg Oral BID   heparin  5,000 Units Subcutaneous Q8H   heparin sodium (porcine)  3,800 Units Intravenous Q M,W,F-HD   hydrALAZINE  100 mg  Oral TID   insulin aspart  0-6 Units Subcutaneous TID WC   labetalol  600 mg Oral BID   pravastatin  40 mg Oral Daily   Continuous Infusions:     LOS: 12 days   Time spent: 25 minutes.  Patrecia Pour, MD [facility] [email or web address] 07/27/2021, 2:38 PM

## 2021-07-28 LAB — RENAL FUNCTION PANEL
Albumin: 2.9 g/dL — ABNORMAL LOW (ref 3.5–5.0)
Anion gap: 9 (ref 5–15)
BUN: 46 mg/dL — ABNORMAL HIGH (ref 6–20)
CO2: 26 mmol/L (ref 22–32)
Calcium: 8.4 mg/dL — ABNORMAL LOW (ref 8.9–10.3)
Chloride: 96 mmol/L — ABNORMAL LOW (ref 98–111)
Creatinine, Ser: 4.38 mg/dL — ABNORMAL HIGH (ref 0.61–1.24)
GFR, Estimated: 15 mL/min — ABNORMAL LOW (ref >60)
Glucose, Bld: 117 mg/dL — ABNORMAL HIGH (ref 70–99)
Phosphorus: 5.1 mg/dL — ABNORMAL HIGH (ref 2.5–4.6)
Potassium: 4.4 mmol/L (ref 3.5–5.1)
Sodium: 131 mmol/L — ABNORMAL LOW (ref 135–145)

## 2021-07-28 LAB — GLUCOSE, CAPILLARY
Glucose-Capillary: 158 mg/dL — ABNORMAL HIGH (ref 70–99)
Glucose-Capillary: 138 mg/dL — ABNORMAL HIGH (ref 70–99)
Glucose-Capillary: 147 mg/dL — ABNORMAL HIGH (ref 70–99)
Glucose-Capillary: 152 mg/dL — ABNORMAL HIGH (ref 70–99)

## 2021-07-28 NOTE — Plan of Care (Signed)
  Problem: Activity: Goal: Risk for activity intolerance will decrease Outcome: Progressing   Problem: Pain Managment: Goal: General experience of comfort will improve Outcome: Progressing   Problem: Safety: Goal: Ability to remain free from injury will improve Outcome: Progressing   

## 2021-07-28 NOTE — Progress Notes (Signed)
Mobility Specialist Progress Note    07/28/21 1222  Mobility  Activity Ambulated in hall  Level of Assistance Standby assist, set-up cues, supervision of patient - no hands on  Assistive Device Four wheel walker  Distance Ambulated (ft) 360 ft  Mobility Ambulated with assistance in hallway  Mobility Response Tolerated fair  Mobility performed by Mobility specialist  $Mobility charge 1 Mobility   Orthostatic BPs Supine 133/80  Sitting 130/77  Standing 111/68  Standing after 3 min 105/79  Post-ambulation 140/83    Pre-Mobility: 77 HR Post-Mobility: 79 HR  Pt received sitting EOB and agreeable. No complaints on walk. Took x1 short standing rest break. Returned to sitting EOB with call bell in reach.   [facility] Mobility Specialist  M.S. Primary Phone: [phone] M.S. Secondary Phone: [phone]

## 2021-07-28 NOTE — Progress Notes (Signed)
Removed Una boots, notified Ortho about  needing the boot to be replaced. Skin clean and intact.

## 2021-07-28 NOTE — Progress Notes (Addendum)
Contacted [facility] this am. Pt's referral is still pending insurance approval and clinic acceptance. Will continue to follow and assist.   Melven Sartorius Renal Navigator [phone]  Addendum at 12:38 pm: Spoke to pt via phone to provide update.    Addendum at 3:56 pm: [facility] called this afternoon to say pt has been approved clinically. Contacted [facility] admissions who states pt's insurance Josem Kaufmann is still pending. Contacted Avis at [employer] to make her aware awaiting insurance approval. Avis states pt would not be able to receive treatment at clinic tomorrow if insurance approves late this afternoon/evening. Clinic could possibly provide a short treatment on Saturday if needed. Will f/u with clinic tomorrow if/when insurance auth received by [facility].

## 2021-07-28 NOTE — Progress Notes (Signed)
PROGRESS NOTE  [name]  WUJ:[id number] DOB: 1965/10/15 DOA: 07/15/2021 PCP: [name], MD  Outpatient Specialists: Cardiology, Dr. Haroldine Laws Brief Narrative: [name] is a 56 y.o. male with a history of chronic HFpEF, stage IV CKD, T2DM, HTN, anemia who was referred to the ED 12/23 from the cardiology clinic due to volume overload. He had experienced >20 lbs weight gain, appeared with significant dependent edema and dyspnea with CXR confirming pulmonary congestion and pleural effusion. BNP 620, Cr near baseline at 4.1, K 5.4. IV lasix and oral metolazone given with rising creatinine prompting nephrology consultation and initiation of hemodialysis thru HD catheter placed this admission. Routine HD continues, awaiting outpatient hemodialysis chair prior to discharge.   Assessment & Plan: Principal Problem:   Acute on chronic diastolic CHF (congestive heart failure) (HCC) Active Problems:   Essential hypertension   Uncontrolled type 2 diabetes mellitus with hyperglycemia (HCC)   CKD (chronic kidney disease) stage 4, GFR 15-29 ml/min (HCC)   AKI (acute kidney injury) ([place])   Acute exacerbation of CHF (congestive heart failure) ([place])  Acute on chronic HFpEF: Echo Sept 2022 w/LVEF 55-60%, G2DD. Remains grossly volume overloaded.  - Managing volume with HD, and augmented lasix to 80mg  po BID.  - Continue I/O monitoring, weight checks. Wt down to further today to 259lbs (DC weight previously 278lbs)  Acute hypoxic respiratory failure due to pulmonary edema due to acute CHF as above: Resolved.  AKI on stage IV CKD > now ESRD: - Continue HD thru [facility] placed 12/30. AVF creation also performed at that time by Dr. Trula Slade.  - CLIP process pending for [facility] in [place]. - Continue outpatient follow up with Dr. Theador Hawthorne.   Chronic LLE wound: for several months after scratching dry area. No evidence of infection, suspect diabetes and swelling are contributing to poor wound healing.   - Unna boots to be continued. No need for abx currently.   HTN:  - Continue labetalol 600mg  BID, hydralazine 100mg  TID. Holding lisinopril, norvasc.   T2DM: Fairly well-controlled with HbA1c 7.1%.  - Continue SSI.  - Holding home glimepiride - Continue statin  OSA: Normal RV on echo. - CPAP qHS has been declined every night since arrival. He does not wear this at home. Diminishes pulmonary reserve.   AOCKD and mild iron deficiency: - Replacing iron stores, nephrology added ESA. Hgb stable.   Obesity: Estimated body mass index is 36.25 kg/m as calculated from the following:   Height as of this encounter: 5\' 11"  (1.803 m).   Weight as of this encounter: 117.9 kg. - BMI has "improved" with volume removal  Hyperkalemia: Resolved.  DVT prophylaxis: Heparin [place] Code Status: Full Family Communication: None at bedside Disposition Plan:  Status is: Inpatient  Remains inpatient appropriate because: Needs outpatient hemodialysis spot.  Consultants:  Cardiology Nephrology Vascular surgery  Procedures:   07/22/21 RIGHT  ARM FISTULA Serafina Mitchell, MD    Antimicrobials: None   Subjective: Tolerated HD yesterday, still equilibrating when getting up but feels more mobile than previously. No chest pain or other complaints today.   Objective: Vitals:   07/27/21 1040 07/27/21 2015 07/28/21 0607 07/28/21 1354  BP: (!) 157/85 135/78 134/77 120/66  Pulse:  82 80 82  Resp: 15 16 16 16   Temp: 98.4 F (36.9 C) 97.9 F (36.6 C) (!) 97.4 F (36.3 C) 97.7 F (36.5 C)  TempSrc: Oral Oral Oral Oral  SpO2: 98% 95% 96% 93%  Weight:   117.9 kg  Height:        Intake/Output Summary (Last 24 hours) at 07/28/2021 1611 Last data filed at 07/28/2021 6644 Gross per 24 hour  Intake 240 ml  Output 540 ml  Net -300 ml   Filed Weights   07/27/21 0700 07/27/21 1038 07/28/21 0607  Weight: 121.3 kg 118.3 kg 117.9 kg   Gen: 56 y.o. male no distress Pulm: Nonlabored CV: RRR, improved  dependent edema.   Data Reviewed: I have personally reviewed following labs and imaging studies  CBC: Recent Labs  Lab 07/23/21 1215 07/27/21 0648  WBC 7.1 6.3  HGB 9.7* 9.8*  HCT 29.5* 31.0*  MCV 83.1 84.5  PLT 286 034   Basic Metabolic Panel: Recent Labs  Lab 07/24/21 0255 07/25/21 0429 07/26/21 0311 07/27/21 0323 07/28/21 0259  NA 136 136 134* 135 131*  K 4.5 4.7 4.4 4.6 4.4  CL 98 99 97* 98 96*  CO2 27 25 26 25 26   GLUCOSE 132* 124* 150* 121* 117*  BUN 81* 91* 67* 79* 46*  CREATININE 6.12* 6.55* 5.15* 5.62* 4.38*  CALCIUM 8.0* 8.5* 8.2* 8.5* 8.4*  PHOS 6.0* 5.9* 5.3* 5.4* 5.1*   GFR: Estimated Creatinine Clearance: 24.6 mL/min (A) (by C-G formula based on SCr of 4.38 mg/dL (H)). Liver Function Tests: Recent Labs  Lab 07/24/21 0255 07/25/21 0429 07/26/21 0311 07/27/21 0323 07/28/21 0259  ALBUMIN 2.8* 2.9* 2.8* 2.9* 2.9*   No results for input(s): LIPASE, AMYLASE in the last 168 hours. No results for input(s): AMMONIA in the last 168 hours. Coagulation Profile: No results for input(s): INR, PROTIME in the last 168 hours. Cardiac Enzymes: No results for input(s): CKTOTAL, CKMB, CKMBINDEX, TROPONINI in the last 168 hours. BNP (last 3 results) No results for input(s): PROBNP in the last 8760 hours. HbA1C: No results for input(s): HGBA1C in the last 72 hours.  CBG: Recent Labs  Lab 07/27/21 1115 07/27/21 1608 07/27/21 2305 07/28/21 0728 07/28/21 1147  GLUCAP 99 180* 109* 158* 138*   Lipid Profile: No results for input(s): CHOL, HDL, LDLCALC, TRIG, CHOLHDL, LDLDIRECT in the last 72 hours. Thyroid Function Tests: No results for input(s): TSH, T4TOTAL, FREET4, T3FREE, THYROIDAB in the last 72 hours.  Anemia Panel: No results for input(s): VITAMINB12, FOLATE, FERRITIN, TIBC, IRON, RETICCTPCT in the last 72 hours.  Urine analysis:    Component Value Date/Time   COLORURINE YELLOW 06/14/2020 [place] 06/14/2020 1452   LABSPEC  1.010 06/14/2020 1452   PHURINE 5.0 06/14/2020 1452   GLUCOSEU NEGATIVE 06/14/2020 1452   HGBUR NEGATIVE 06/14/2020 1452   BILIRUBINUR NEGATIVE 06/14/2020 1452   KETONESUR NEGATIVE 06/14/2020 1452   PROTEINUR 100 (A) 06/14/2020 1452   NITRITE NEGATIVE 06/14/2020 1452   LEUKOCYTESUR NEGATIVE 06/14/2020 1452   Recent Results (from the past 240 hour(s))  Surgical pcr screen     Status: None   Collection Time: 07/21/21  9:10 PM   Specimen: Nasal Mucosa; Nasal Swab  Result Value Ref Range Status   MRSA, PCR NEGATIVE NEGATIVE Final   Staphylococcus aureus NEGATIVE NEGATIVE Final    Comment: (NOTE) The Xpert SA Assay (FDA approved for NASAL specimens in patients 55 years of age and older), is one component of a comprehensive surveillance program. It is not intended to diagnose infection nor to guide or monitor treatment. Performed at [facility], [place] [address], [place], [place] [address]       Radiology Studies: No results found.  Scheduled Meds:  calcium acetate  667 mg Oral TID WC   Chlorhexidine Gluconate Cloth  6 each Topical Daily   darbepoetin (ARANESP) injection - NON-DIALYSIS  100 mcg Subcutaneous Q Mon-1800   furosemide  80 mg Oral BID   heparin  5,000 Units Subcutaneous Q8H   heparin sodium (porcine)  3,800 Units Intravenous Q M,W,F-HD   hydrALAZINE  100 mg Oral TID   insulin aspart  0-6 Units Subcutaneous TID WC   labetalol  600 mg Oral BID   pravastatin  40 mg Oral Daily   Continuous Infusions:   LOS: 13 days   Patrecia Pour, MD [facility] [email or web address] 07/28/2021, 4:11 PM

## 2021-07-28 NOTE — Progress Notes (Signed)
Occupational Therapy Treatment Patient Details Name: [name] MRN: [id number] DOB: 24-May-1966 Today's Date: 07/28/2021   History of present illness 56 y.o. M who presents 07/15/2021 with volume overload. Significant PMH: chronic HFpEF, stage IV CKD, T2DM, HTN, anemia.   OT comments  Pt progressing towards established OT goals. Pt performing functional mobility with Supervision. Pt continues to present with decreased activity tolerance and reports dizziness ("I feel like Im spinning or moving when him standing still") with activity. Checking BP and would benefit form orthostatic BP at next session. Pt also reporting that at an OP kidney doctor apt, he was told he had an internal bleed and had to receive blood. His MD had recommended a gastroenterology appointment. Notified RN and recommended patient to notified MD during rounding. Continue to recommend dc to home and will continue to follow acutely as admitted.     SpO2 96% on RA. HR 80s. BP standing after mobility 116/65 (77). BP sitting at EOB after 5 min of rest 128/77 (95).    Recommendations for follow up therapy are one component of a multi-disciplinary discharge planning process, led by the attending physician.  Recommendations may be updated based on patient status, additional functional criteria and insurance authorization.    Follow Up Recommendations  No OT follow up    Assistance Recommended at Discharge Intermittent Supervision/Assistance  Patient can return home with the following  Assistance with cooking/housework   Equipment Recommendations  None recommended by OT    Recommendations for Other Services      Precautions / Restrictions Precautions Precautions: Fall Precaution Comments: watch Spo2       Mobility Bed Mobility Overal bed mobility: Modified Independent             General bed mobility comments: increased time    Transfers Overall transfer level: Needs assistance Equipment used:  None Transfers: Sit to/from Stand Sit to Stand: Supervision           General transfer comment: for safety, no physical assist     Balance Overall balance assessment: Needs assistance Sitting-balance support: Feet supported Sitting balance-Leahy Scale: Good     Standing balance support: No upper extremity supported;During functional activity Standing balance-Leahy Scale: Fair                             ADL either performed or assessed with clinical judgement   ADL Overall ADL's : Needs assistance/impaired                         Toilet Transfer: Supervision/safety;Ambulation (simulated in room) Toilet Transfer Details (indicate cue type and reason): Supervision for safety         Functional mobility during ADLs: Supervision/safety General ADL Comments: Agreeable to mobility in hallway. Pt continues to report dizziness with movement - stating he feels he is spinning or moving when standing still. Taking BPs (see general comments) - recommend orthostatic BPS next session    Extremity/Trunk Assessment Upper Extremity Assessment Upper Extremity Assessment: Generalized weakness   Lower Extremity Assessment Lower Extremity Assessment: Defer to PT evaluation RLE Deficits / Details: Increased edema LLE Deficits / Details: Increased edema        Vision   Vision Assessment?: No apparent visual deficits   Perception Perception Perception: Not tested   Praxis Praxis Praxis: Not tested    Cognition Arousal/Alertness: Awake/alert Behavior During Therapy: WFL for tasks assessed/performed Overall Cognitive Status: Within  Functional Limits for tasks assessed                                            Exercises     Shoulder Instructions       General Comments SpO2 96% on RA. HR 80s. BP standing afgter mobility 116/65 (77). BP sitting at EOB after 5 min of rest 128/77 (95)    Pertinent Vitals/ Pain       Pain Assessment:  Faces Faces Pain Scale: No hurt Pain Location: neck, sx site Pain Descriptors / Indicators: Discomfort;Grimacing;Guarding Pain Intervention(s): Monitored during session;Limited activity within patient's tolerance;Repositioned  Home Living                                          Prior Functioning/Environment              Frequency  Min 2X/week        Progress Toward Goals  OT Goals(current goals can now be found in the care plan section)  Progress towards OT goals: Progressing toward goals  Acute Rehab OT Goals OT Goal Formulation: With patient Time For Goal Achievement: 08/03/21 Potential to Achieve Goals: Good ADL Goals Pt Will Perform Lower Body Bathing: with set-up;sit to/from stand Pt Will Perform Lower Body Dressing: with set-up;sit to/from stand Additional ADL Goal #1: pt will tolerate at least 8 minutes of OOB functional activity at a mod I level Additional ADL Goal #2: Pt will indep verbalize at least 3 energy conservation stategies  Plan Discharge plan needs to be updated;Frequency remains appropriate;Equipment recommendations need to be updated    Co-evaluation                 AM-PAC OT "6 Clicks" Daily Activity     Outcome Measure   Help from another person eating meals?: None Help from another person taking care of personal grooming?: A Little Help from another person toileting, which includes using toliet, bedpan, or urinal?: A Little Help from another person bathing (including washing, rinsing, drying)?: A Little Help from another person to put on and taking off regular upper body clothing?: None Help from another person to put on and taking off regular lower body clothing?: A Little 6 Click Score: 20    End of Session    OT Visit Diagnosis: Unsteadiness on feet (R26.81);Muscle weakness (generalized) (M62.81)   Activity Tolerance Patient tolerated treatment well   Patient Left in bed;with call bell/phone within reach    Nurse Communication Mobility status (Pt reporting at an OP MD apt, the doctor told her he was having a bleed in his gut)        Time: 4008-6761 OT Time Calculation (min): 16 min  Charges: OT General Charges $OT Visit: 1 Visit OT Treatments $Therapeutic Activity: 8-22 mins  [place], OTR/L Acute Rehab Pager: [phone] Office: [place] 07/28/2021, 8:56 AM

## 2021-07-28 NOTE — Progress Notes (Addendum)
Physical Therapy Treatment Patient Details Name: [name] MRN: [id number] DOB: [birth year] Today's Date: 07/28/2021   History of Present Illness 56 y.o. M who presents 07/15/2021 with volume overload. Significant PMH: chronic HFpEF, stage IV CKD, T2DM, HTN, anemia.    PT Comments    Focuses session on lower extremity strengthening as pt had already ambulating with OT and mobility specialist today. Pt with bil lower extremity weakness, primarily in the R quads, that impacts his ease with stair negotiation. Attempted to target the R quad with squats with R foot posterior to L and with partial lunges. Pt fatigued quickly with standing exercises and continues to report dizziness intermittently in standing. See below in regards to BP. Marye Round was negative bil. Will continue to follow acutely. Current recommendations remain appropriate.  BP:  sitting 134/80 standing 127/68 standing several min 120/67 while exercising in standing 114/83    Recommendations for follow up therapy are one component of a multi-disciplinary discharge planning process, led by the attending physician.  Recommendations may be updated based on patient status, additional functional criteria and insurance authorization.  Follow Up Recommendations  Home health PT     Assistance Recommended at Discharge PRN  Patient can return home with the following Help with stairs or ramp for entrance;Assistance with cooking/housework   Equipment Recommendations  None recommended by PT    Recommendations for Other Services       Precautions / Restrictions Precautions Precautions: Fall Precaution Comments: watch Spo2 Restrictions Weight Bearing Restrictions: No     Mobility  Bed Mobility Overal bed mobility: Modified Independent             General bed mobility comments: increased time, but able to perform all bed mobility aspects without assistance.    Transfers Overall transfer level: Needs  assistance Equipment used: None Transfers: Sit to/from Stand Sit to Stand: Supervision           General transfer comment: for safety, no physical assist    Ambulation/Gait Ambulation/Gait assistance: Supervision Gait Distance (Feet): 13 Feet Assistive device: None Gait Pattern/deviations: Step-through pattern;Decreased stride length Gait velocity: decr Gait velocity interpretation: <1.8 ft/sec, indicate of risk for recurrent falls   General Gait Details: Pt ambulating within room for lower extremity exercises, posterior trunk sway when looking inferiorly 1x but able to catch self.   Stairs             Wheelchair Mobility    Modified Rankin (Stroke Patients Only)       Balance Overall balance assessment: Needs assistance Sitting-balance support: Feet supported Sitting balance-Leahy Scale: Good     Standing balance support: No upper extremity supported;During functional activity Standing balance-Leahy Scale: Fair                              Cognition Arousal/Alertness: Awake/alert Behavior During Therapy: WFL for tasks assessed/performed Overall Cognitive Status: Within Functional Limits for tasks assessed                                          Exercises General Exercises - Lower Extremity Hip ABduction/ADduction: AROM;Strengthening;Both;10 reps;Standing (at sink) Heel Raises: Strengthening;Both;10 reps;Standing (at sink, x2 sets) Mini-Sqauts: Strengthening;Both;10 reps;Standing (intermittent UE support) Other Exercises Other Exercises: AROM bil hip extension at sink, 10x Other Exercises: Toe taps on sideways trash can at sink, 8x each  Other Exercises: Partial Lunges with UE support, 3x each    General Comments General comments (skin integrity, edema, etc.): BP sitting 134/80, standing 127/68, standing several min 120/67, and while exercising in standing 114/83      Pertinent Vitals/Pain Pain Assessment: Faces Faces  Pain Scale: Hurts a little bit Pain Location: legs Pain Descriptors / Indicators: Discomfort;Grimacing;Guarding Pain Intervention(s): Limited activity within patient's tolerance;Monitored during session;Repositioned    Home Living                          Prior Function            PT Goals (current goals can now be found in the care plan section) Acute Rehab PT Goals Patient Stated Goal: to go home soon PT Goal Formulation: With patient Time For Goal Achievement: 08/02/21 Potential to Achieve Goals: Good Progress towards PT goals: Progressing toward goals    Frequency    Min 2X/week      PT Plan Current plan remains appropriate;Frequency needs to be updated    Co-evaluation              AM-PAC PT "6 Clicks" Mobility   Outcome Measure  Help needed turning from your back to your side while in a flat bed without using bedrails?: None Help needed moving from lying on your back to sitting on the side of a flat bed without using bedrails?: None Help needed moving to and from a bed to a chair (including a wheelchair)?: A Little Help needed standing up from a chair using your arms (e.g., wheelchair or bedside chair)?: A Little Help needed to walk in hospital room?: A Little Help needed climbing 3-5 steps with a railing? : A Little 6 Click Score: 20    End of Session   Activity Tolerance: Patient tolerated treatment well Patient left: in bed;with call bell/phone within reach Nurse Communication: Mobility status;Other (comment) (BP) PT Visit Diagnosis: Unsteadiness on feet (R26.81);Difficulty in walking, not elsewhere classified (R26.2);Muscle weakness (generalized) (M62.81)     Time: 7416-3845 PT Time Calculation (min) (ACUTE ONLY): 28 min  Charges:  $Therapeutic Exercise: 23-37 mins                     Moishe Spice, PT, DPT Acute Rehabilitation Services  Pager: [phone] Office: [place] 07/28/2021, 6:11 PM

## 2021-07-28 NOTE — TOC Progression Note (Signed)
Transition of Care [facility]) - Progression Note    Patient Details  Name: [name] MRN: [id number] Date of Birth: 1966-02-20  Transition of Care [facility]) CM/SW [place], RN Phone Number: 07/28/2021, 11:34 AM  Clinical Narrative:    Patient had dialysis he is -3000 since admit. Renal navigator on board and has sent for authorization for clipping..  Will DC home with home health, DME RW 4 wheeled.ordered from [employer].   Expected Discharge Plan: [place] Barriers to Discharge: Continued Medical Work up  Expected Discharge Plan and Services Expected Discharge Plan: [place] In-house Referral: NA Discharge Planning Services: CM Consult Post Acute Care Choice: [place] arrangements for the past 2 months: Single Family Home                           HH Arranged: RN, PT, Disease Management           Social Determinants of Health (SDOH) Interventions    Readmission Risk Interventions Readmission Risk Prevention Plan 04/18/2021 09/27/2020  Transportation Screening Complete Complete  Home Care Screening Complete Complete  Medication Review (RN CM) Complete Complete  Some recent data might be hidden

## 2021-07-28 NOTE — Progress Notes (Signed)
[name] Progress Note   56 y.o. 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  Dr. Haroldine Laws rx  Lasix 160 mg IV every 6 hours plus metolazone 5 mg daily with the knowledge that he may need to start dialysis if he does not respond.  Cardiology is also planning to rule out amyloid as well.    He follows with Dr. Theador Hawthorne.  Per nephrology notes his metolazone was increased to 5 mg three times a week at the 06/08/21 office appointment.    Assessment/ Plan:   1)  Acute on chronic diastolic CHF  -Given we have started dialysis we will switch Lasix to 80 mg twice daily as long as UOP is good -Management of fluid with dialysis at this time   2)  AKI on CKD stage IV.  Now ESRD - Cr was 3.74 with GFR 18 in November 2022 and in September GFR below 15 - He follows with an outside practice, Dr. Theador [facility] -  needs access-patient has been hesitant to undergo dialysis and receive access placement.  Now agreed and s/p AVF creation and TDC placement on 12/30; appreciate help from [facility]  Started CLIP process on Tuesday; renal navigator awaiting final approval.  Last HD Wed w/ 3L net UF  Next HD Fri   3)  HTN  - optimize volume status with diuretics and dialysis as above- - continue current medications   4)  OSA - note decreased respiratory reserve   5) DM type 2 - per primary team    6)  Anemia-  iron stores just a little low, will replete and have added ESA -  Hgb 9.7   7) Hyperkalemia-  resolved 8) Renal osteodystrophy - phos mildly elevated but almost certainly will continue to incr - start phoslo 1 TIDM  Subjective:   Feels breathing is better. Denies f/c/n/v/cp. UNNA boots changed 2 nights ago. Some cramping after dialysis yest when he returned to the room.   Objective:   BP  134/77 (BP Location: Left Arm)    Pulse 80    Temp (!) 97.4 F (36.3 C) (Oral)    Resp 16    Ht 5\' 11"  (1.803 m)    Wt 117.9 kg    SpO2 96%    BMI 36.25 kg/m   Intake/Output Summary (Last 24 hours) at 07/28/2021 6568 Last data filed at 07/28/2021 1275 Gross per 24 hour  Intake 480 ml  Output 3540 ml  Net -3060 ml   Weight change: -3.082 kg  Physical Exam: General: Supine in  bed, nad HEENT: NCAT Heart: normal rate Lungs: Bilateral chest rise, no increased work of breathing Abdomen: S, obese habitus Extremities: tr-1+ pitting edema bilateral lower extremities, UNNA boots on Skin: chronic venous stasis changes Neuro: alert and oriented x 3 provides hx and follows commands GU no foley Access: RIJ TC + rt CImino (+thrill)    Imaging: No results found.  Labs: BMET Recent Labs  Lab 07/22/21 0244 07/23/21 0200 07/24/21 0255 07/25/21 0429 07/26/21 0311 07/27/21 0323 07/28/21 0259  NA 135 133* 136 136 134* 135 131*  K 4.4 4.3 4.5 4.7 4.4 4.6 4.4  CL 100 96* 98 99 97* 98 96*  CO2 25 24 27 25 26 25 26   GLUCOSE 114* 158* 132* 124* 150* 121* 117*  BUN 97*  102* 81* 91* 67* 79* 46*  CREATININE 6.31* 6.93* 6.12* 6.55* 5.15* 5.62* 4.38*  CALCIUM 8.5* 8.2* 8.0* 8.5* 8.2* 8.5* 8.4*  PHOS 5.8* 6.1* 6.0* 5.9* 5.3* 5.4* 5.1*   CBC Recent Labs  Lab 07/23/21 1215 07/27/21 0648  WBC 7.1 6.3  HGB 9.7* 9.8*  HCT 29.5* 31.0*  MCV 83.1 84.5  PLT 286 320    Medications:     calcium acetate  667 mg Oral TID WC   Chlorhexidine Gluconate Cloth  6 each Topical Daily   darbepoetin (ARANESP) injection - NON-DIALYSIS  100 mcg Subcutaneous Q Mon-1800   furosemide  80 mg Oral BID   heparin  5,000 Units Subcutaneous Q8H   heparin sodium (porcine)  3,800 Units Intravenous Q M,W,F-HD   hydrALAZINE  100 mg Oral TID   insulin aspart  0-6 Units Subcutaneous TID WC   labetalol  600 mg Oral BID   pravastatin  40 mg Oral Daily      Larry Santee, MD 07/28/2021, 9:21 AM

## 2021-07-28 NOTE — Progress Notes (Signed)
Orthopedic Tech Progress Note Patient Details:  [name] Dec 04, 1965 [id number]  Ortho Devices Type of Ortho Device: Louretta Parma boot Ortho Device/Splint Location: BLE Ortho Device/Splint Interventions: Application, Ordered   Post Interventions Patient Tolerated: Well Instructions Provided: Care of device  Chandel Zaun A Madhavi Hamblen 07/28/2021, 6:05 PM

## 2021-07-29 LAB — CBC
HCT: 32.6 % — ABNORMAL LOW (ref 39.0–52.0)
Hemoglobin: 10.4 g/dL — ABNORMAL LOW (ref 13.0–17.0)
MCH: 26.9 pg (ref 26.0–34.0)
MCHC: 31.9 g/dL (ref 30.0–36.0)
MCV: 84.2 fL (ref 80.0–100.0)
Platelets: 310 10*3/uL (ref 150–400)
RBC: 3.87 MIL/uL — ABNORMAL LOW (ref 4.22–5.81)
RDW: 14.6 % (ref 11.5–15.5)
WBC: 5.8 10*3/uL (ref 4.0–10.5)
nRBC: 0 % (ref 0.0–0.2)

## 2021-07-29 LAB — GLUCOSE, CAPILLARY
Glucose-Capillary: 128 mg/dL — ABNORMAL HIGH (ref 70–99)
Glucose-Capillary: 148 mg/dL — ABNORMAL HIGH (ref 70–99)
Glucose-Capillary: 187 mg/dL — ABNORMAL HIGH (ref 70–99)
Glucose-Capillary: 160 mg/dL — ABNORMAL HIGH (ref 70–99)
Glucose-Capillary: 132 mg/dL — ABNORMAL HIGH (ref 70–99)

## 2021-07-29 LAB — RENAL FUNCTION PANEL
Albumin: 2.8 g/dL — ABNORMAL LOW (ref 3.5–5.0)
Anion gap: 11 (ref 5–15)
BUN: 65 mg/dL — ABNORMAL HIGH (ref 6–20)
CO2: 26 mmol/L (ref 22–32)
Calcium: 8.5 mg/dL — ABNORMAL LOW (ref 8.9–10.3)
Chloride: 96 mmol/L — ABNORMAL LOW (ref 98–111)
Creatinine, Ser: 5.37 mg/dL — ABNORMAL HIGH (ref 0.61–1.24)
GFR, Estimated: 12 mL/min — ABNORMAL LOW (ref >60)
Glucose, Bld: 110 mg/dL — ABNORMAL HIGH (ref 70–99)
Phosphorus: 5.2 mg/dL — ABNORMAL HIGH (ref 2.5–4.6)
Potassium: 4.3 mmol/L (ref 3.5–5.1)
Sodium: 133 mmol/L — ABNORMAL LOW (ref 135–145)

## 2021-07-29 NOTE — Progress Notes (Signed)
[facility] Progress Note   56 y.o. 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  Dr. Haroldine Laws rx  Lasix 160 mg IV every 6 hours plus metolazone 5 mg daily with the knowledge that he may need to start dialysis if he does not respond.  Cardiology is also planning to rule out amyloid as well.    He follows with Dr. Theador Hawthorne.  Per nephrology notes his metolazone was increased to 5 mg three times a week at the 06/08/21 office appointment.    Assessment/ Plan:   1)  Acute on chronic diastolic CHF  -Given we have started dialysis we will switch Lasix to 80 mg twice daily as long as UOP is good -Management of fluid with dialysis at this time   2)  AKI on CKD stage IV.  Now ESRD - Cr was 3.74 with GFR 18 in November 2022 and in September GFR below 15 - He follows with an outside practice, Dr. Theador Hawth[facility] -  needs access-patient has been hesitant to undergo dialysis and receive access placement.  Now agreed and s/p AVF creation and TDC placement on 12/30; appreciate help from VVS  Started CLIP process on Tuesday; renal navigator awaiting final approval.  Last HD Wed w/ 3L net UF  Seen on  HD RIJ TC 3K bath 132/58 Goal 2.5L net UF   3)  HTN  - optimize volume status with diuretics and dialysis as above- - continue current medications   4)  OSA - note decreased respiratory reserve   5) DM type 2 - per primary team    6)  Anemia-  iron stores just a little low, will replete and have added ESA -  Hgb 9.7   7) Hyperkalemia-  resolved 8) Renal osteodystrophy - phos mildly elevated but almost certainly will continue to incr - started on phoslo 1 TIDM; recheck phos early next week  Subjective:   Feels breathing is better. Denies f/c/n/v/cp. UNNA boots changed  last night.   Objective:    BP (!) 166/87 (BP Location: Left Arm)    Pulse 85    Temp 98.3 F (36.8 C) (Oral)    Resp (!) 22    Ht 5\' 11"  (1.803 m)    Wt 118.5 kg    SpO2 91%    BMI 36.44 kg/m   Intake/Output Summary (Last 24 hours) at 07/29/2021 0839 Last data filed at 07/29/2021 0559 Gross per 24 hour  Intake 60 ml  Output 1100 ml  Net -1040 ml   Weight change: 0.2 kg  Physical Exam: General: Supine in  bed, nad HEENT: NCAT Heart: normal rate Lungs: Bilateral chest rise, no increased work of breathing Abdomen: S, obese habitus Extremities: tr-1+ pitting edema bilateral lower extremities, UNNA boots on Skin: chronic venous stasis changes Neuro: alert and oriented x 3 provides hx and follows commands GU no foley Access: RIJ TC + rt CImino (+thrill)    Imaging: No results found.  Labs: BMET Recent Labs  Lab 07/23/21 0200 07/24/21 0255 07/25/21 0429 07/26/21 0311 07/27/21 0323 07/28/21 0259 07/29/21 0123  NA 133* 136 136 134* 135 131* 133*  K 4.3 4.5 4.7 4.4 4.6 4.4 4.3  CL 96* 98 99 97* 98 96* 96*  CO2 24 27 25 26 25 26 26   GLUCOSE 158* 132* 124* 150*  121* 117* 110*  BUN 102* 81* 91* 67* 79* 46* 65*  CREATININE 6.93* 6.12* 6.55* 5.15* 5.62* 4.38* 5.37*  CALCIUM 8.2* 8.0* 8.5* 8.2* 8.5* 8.4* 8.5*  PHOS 6.1* 6.0* 5.9* 5.3* 5.4* 5.1* 5.2*   CBC Recent Labs  Lab 07/23/21 1215 07/27/21 0648  WBC 7.1 6.3  HGB 9.7* 9.8*  HCT 29.5* 31.0*  MCV 83.1 84.5  PLT 286 320    Medications:     calcium acetate  667 mg Oral TID WC   Chlorhexidine Gluconate Cloth  6 each Topical Daily   darbepoetin (ARANESP) injection - NON-DIALYSIS  100 mcg Subcutaneous Q Mon-1800   furosemide  80 mg Oral BID   heparin  5,000 Units Subcutaneous Q8H   heparin sodium (porcine)  3,800 Units Intravenous Q M,W,F-HD   hydrALAZINE  100 mg Oral TID   insulin aspart  0-6 Units Subcutaneous TID WC   labetalol  600 mg Oral BID   pravastatin  40 mg Oral Daily      Otelia Santee, MD 07/29/2021, 8:39 AM

## 2021-07-29 NOTE — Progress Notes (Signed)
PROGRESS NOTE  [name]  YKZ:[id number] DOB: Apr 23, 1966 DOA: 07/15/2021 PCP: [name], MD  Outpatient Specialists: Cardiology, Dr. Haroldine Laws Brief Narrative: [name] is a 56 y.o. male with a history of chronic HFpEF, stage IV CKD, T2DM, HTN, anemia who was referred to the ED 12/23 from the cardiology clinic due to volume overload. He had experienced >20 lbs weight gain, appeared with significant dependent edema and dyspnea with CXR confirming pulmonary congestion and pleural effusion. BNP 620, Cr near baseline at 4.1, K 5.4. IV lasix and oral metolazone given with rising creatinine prompting nephrology consultation and initiation of hemodialysis thru HD catheter placed this admission. Routine HD continues, awaiting outpatient hemodialysis chair prior to discharge.   Assessment & Plan: Principal Problem:   Acute on chronic diastolic CHF (congestive heart failure) (HCC) Active Problems:   Essential hypertension   Uncontrolled type 2 diabetes mellitus with hyperglycemia (HCC)   CKD (chronic kidney disease) stage 4, GFR 15-29 ml/min (HCC)   AKI (acute kidney injury) ([place])   Acute exacerbation of CHF (congestive heart failure) ([place])  Acute on chronic HFpEF: Echo Sept 2022 w/LVEF 55-60%, G2DD. Remains grossly volume overloaded.  - Managing volume with HD, and augmented lasix to 80mg  po BID.  - Continue I/O monitoring, weight checks. Wt down to further today (DC weight previously 278lbs)  Acute hypoxic respiratory failure due to pulmonary edema due to acute CHF as above: Resolved.  AKI on stage IV CKD > now ESRD: - Continue HD thru [facility] placed 12/30. AVF creation also performed at that time by Dr. Trula Slade. IHD today per nephrology. - CLIP process still pending per renal navigator for [facility] in [place]. - Continue outpatient follow up with Dr. Theador Hawthorne.   Chronic LLE wound: for several months after scratching dry area. No evidence of infection, suspect diabetes and  swelling are contributing to poor wound healing.  - Unna boots to be continued. No need for abx currently.   HTN:  - Continue labetalol 600mg  BID, hydralazine 100mg  TID. Holding lisinopril, norvasc.   T2DM: Fairly well-controlled with HbA1c 7.1%.  - Continue SSI.  - Holding home glimepiride - Continue statin  OSA: Normal RV on echo. - CPAP qHS has been declined every night since arrival. He does not wear this at home. Diminishes pulmonary reserve.   AOCKD and mild iron deficiency: - Replacing iron stores, nephrology added ESA. Hgb stable.   Obesity: Estimated body mass index is 35.67 kg/m as calculated from the following:   Height as of this encounter: 5\' 11"  (1.803 m).   Weight as of this encounter: 116 kg. - BMI has "improved" with volume removal  Hyperkalemia: Resolved.  DVT prophylaxis: Heparin [place] Code Status: Full Family Communication: None at bedside Disposition Plan:  Status is: Inpatient  Remains inpatient appropriate because: Needs outpatient hemodialysis spot.  Consultants:  Cardiology Nephrology Vascular surgery  Procedures:   07/22/21 RIGHT  ARM FISTULA Serafina Mitchell, MD    Antimicrobials: None   Subjective: No new complaints. Insurance Josem Kaufmann is pending.   Objective: Vitals:   07/29/21 1030 07/29/21 1100 07/29/21 1130 07/29/21 1156  BP: (!) 146/90 (!) 152/87 (!) 145/83 (!) 147/83  Pulse: 76 76 75 76  Resp:    15  Temp:    98.2 F (36.8 C)  TempSrc:    Oral  SpO2:    95%  Weight:    116 kg  Height:        Intake/Output Summary (Last 24 hours) at 07/29/2021 1222  Last data filed at 07/29/2021 1156 Gross per 24 hour  Intake 60 ml  Output 3600 ml  Net -3540 ml   Filed Weights   07/29/21 0457 07/29/21 0809 07/29/21 1156  Weight: 118.5 kg 118.5 kg 116 kg   Gen: 56 y.o. male no distress Pulm: Nonlabored, clear CV: RRR, improved edema with unna boots bilaterally Ext: Right IJ TDC c/d/i. Unna boots BL  Data Reviewed: I have personally  reviewed following labs and imaging studies  CBC: Recent Labs  Lab 07/23/21 1215 07/27/21 0648 07/29/21 0832  WBC 7.1 6.3 5.8  HGB 9.7* 9.8* 10.4*  HCT 29.5* 31.0* 32.6*  MCV 83.1 84.5 84.2  PLT 286 320 003   Basic Metabolic Panel: Recent Labs  Lab 07/25/21 0429 07/26/21 0311 07/27/21 0323 07/28/21 0259 07/29/21 0123  NA 136 134* 135 131* 133*  K 4.7 4.4 4.6 4.4 4.3  CL 99 97* 98 96* 96*  CO2 25 26 25 26 26   GLUCOSE 124* 150* 121* 117* 110*  BUN 91* 67* 79* 46* 65*  CREATININE 6.55* 5.15* 5.62* 4.38* 5.37*  CALCIUM 8.5* 8.2* 8.5* 8.4* 8.5*  PHOS 5.9* 5.3* 5.4* 5.1* 5.2*   GFR: Estimated Creatinine Clearance: 19.9 mL/min (A) (by C-G formula based on SCr of 5.37 mg/dL (H)). Liver Function Tests: Recent Labs  Lab 07/25/21 0429 07/26/21 0311 07/27/21 0323 07/28/21 0259 07/29/21 0123  ALBUMIN 2.9* 2.8* 2.9* 2.9* 2.8*   No results for input(s): LIPASE, AMYLASE in the last 168 hours. No results for input(s): AMMONIA in the last 168 hours. Coagulation Profile: No results for input(s): INR, PROTIME in the last 168 hours. Cardiac Enzymes: No results for input(s): CKTOTAL, CKMB, CKMBINDEX, TROPONINI in the last 168 hours. BNP (last 3 results) No results for input(s): PROBNP in the last 8760 hours. HbA1C: No results for input(s): HGBA1C in the last 72 hours.  CBG: Recent Labs  Lab 07/28/21 0728 07/28/21 1147 07/28/21 1617 07/28/21 2126 07/29/21 0728  GLUCAP 158* 138* 147* 152* 128*   Lipid Profile: No results for input(s): CHOL, HDL, LDLCALC, TRIG, CHOLHDL, LDLDIRECT in the last 72 hours. Thyroid Function Tests: No results for input(s): TSH, T4TOTAL, FREET4, T3FREE, THYROIDAB in the last 72 hours.  Anemia Panel: No results for input(s): VITAMINB12, FOLATE, FERRITIN, TIBC, IRON, RETICCTPCT in the last 72 hours.  Urine analysis:    Component Value Date/Time   COLORURINE YELLOW 06/14/2020 [place] 06/14/2020 1452   LABSPEC 1.010  06/14/2020 1452   PHURINE 5.0 06/14/2020 1452   GLUCOSEU NEGATIVE 06/14/2020 1452   HGBUR NEGATIVE 06/14/2020 1452   BILIRUBINUR NEGATIVE 06/14/2020 1452   KETONESUR NEGATIVE 06/14/2020 1452   PROTEINUR 100 (A) 06/14/2020 1452   NITRITE NEGATIVE 06/14/2020 1452   LEUKOCYTESUR NEGATIVE 06/14/2020 1452   Recent Results (from the past 240 hour(s))  Surgical pcr screen     Status: None   Collection Time: 07/21/21  9:10 PM   Specimen: Nasal Mucosa; Nasal Swab  Result Value Ref Range Status   MRSA, PCR NEGATIVE NEGATIVE Final   Staphylococcus aureus NEGATIVE NEGATIVE Final    Comment: (NOTE) The Xpert SA Assay (FDA approved for NASAL specimens in patients 86 years of age and older), is one component of a comprehensive surveillance program. It is not intended to diagnose infection nor to guide or monitor treatment. Performed at [facility], [place] [address] [place], [place] [address]       Radiology Studies: No results found.  Scheduled Meds:  calcium  acetate  667 mg Oral TID WC   Chlorhexidine Gluconate Cloth  6 each Topical Daily   darbepoetin (ARANESP) injection - NON-DIALYSIS  100 mcg Subcutaneous Q Mon-1800   furosemide  80 mg Oral BID   heparin  5,000 Units Subcutaneous Q8H   heparin sodium (porcine)  3,800 Units Intravenous Q M,W,F-HD   hydrALAZINE  100 mg Oral TID   insulin aspart  0-6 Units Subcutaneous TID WC   labetalol  600 mg Oral BID   pravastatin  40 mg Oral Daily   Continuous Infusions:   LOS: 14 days   Patrecia Pour, MD [facility] [email or web address] 07/29/2021, 12:22 PM

## 2021-07-29 NOTE — Progress Notes (Signed)
Mobility Specialist Progress Note    07/29/21 1520  Mobility  Activity Refused mobility   Pt wanted to rest. Will f/u this weekend.   [facility] Mobility Specialist  M.S. 2C and 6E: [phone] M.S. 4E: [phone]

## 2021-07-29 NOTE — Progress Notes (Addendum)
Received call from [facility] with [facility]missions this am. Insurance auth for out-pt HD is still pending. Update provided to Dr Augustin Coupe.Will follow and assist.    Melven Sartorius Renal Navigator [phone]  Addendum at 2:35 pm: Received a call from [facility] with [facility]missions this am to say that pt's insurance has denied services at [facility] due to clinic is not in-network with pt's insurance. Inquired of [place] what clinic would be considered in-network. [facility] admissions to contact pt's insurance to inquire about in-network clinics and if any of those are through [place]. Navigator also proceeded with making referral to [facility] admissions as well. Unsure at this time if [facility] is in or out of network with [employer]. At this time, awaiting response from [facility] and [facility] in regards to coverage/approval for out-pt HD. Met with pt at bedside to discuss the above info. Pt voices understanding and has no further questions at this time. Update provided to nephrologist as well.   Addendum at 4:22 pm: Received a return call from [facility] with [facility] admissions this afternoon. [facility] was informed they indeed are out-of-network and that [facility] is a possible in-network option. Contacted [facility] admissions and was informed that pt's case is still pending. Will f/u with [facility] admissions on Monday. Attempted to reach pt via phone. Message left for pt with an update regarding the latest information. Update provided to nephrologist as well.

## 2021-07-29 NOTE — Plan of Care (Signed)

## 2021-07-30 LAB — RENAL FUNCTION PANEL
Albumin: 3 g/dL — ABNORMAL LOW (ref 3.5–5.0)
Anion gap: 9 (ref 5–15)
BUN: 40 mg/dL — ABNORMAL HIGH (ref 6–20)
CO2: 26 mmol/L (ref 22–32)
Calcium: 8.6 mg/dL — ABNORMAL LOW (ref 8.9–10.3)
Chloride: 99 mmol/L (ref 98–111)
Creatinine, Ser: 4.61 mg/dL — ABNORMAL HIGH (ref 0.61–1.24)
GFR, Estimated: 14 mL/min — ABNORMAL LOW (ref >60)
Glucose, Bld: 129 mg/dL — ABNORMAL HIGH (ref 70–99)
Phosphorus: 4.6 mg/dL (ref 2.5–4.6)
Potassium: 4.5 mmol/L (ref 3.5–5.1)
Sodium: 134 mmol/L — ABNORMAL LOW (ref 135–145)

## 2021-07-30 LAB — GLUCOSE, CAPILLARY
Glucose-Capillary: 135 mg/dL — ABNORMAL HIGH (ref 70–99)
Glucose-Capillary: 159 mg/dL — ABNORMAL HIGH (ref 70–99)
Glucose-Capillary: 163 mg/dL — ABNORMAL HIGH (ref 70–99)
Glucose-Capillary: 175 mg/dL — ABNORMAL HIGH (ref 70–99)

## 2021-07-30 NOTE — Progress Notes (Signed)
Mobility Specialist Progress Note:   07/30/21 1148  Mobility  Activity Ambulated in hall  Level of Assistance Independent  Assistive Device None  Distance Ambulated (ft) 360 ft  Mobility Ambulated with assistance in hallway  Mobility Response Tolerated well  Mobility performed by Mobility specialist  $Mobility charge 1 Mobility   Pt received in bed willing to participate in mobility. No complaints of pain. Pt returned to EOB with call bell in reach and all needs met.   [facility] Public librarian Phone [phone] Secondary Phone [phone]

## 2021-07-30 NOTE — Progress Notes (Signed)
PROGRESS NOTE  [name]  WGN:[id number] DOB: [birth year] DOA: 07/15/2021 PCP: [name], MD  Outpatient Specialists: Cardiology, Dr. Haroldine Laws Brief Narrative: [name] is a 56 y.o. male with a history of chronic HFpEF, stage IV CKD, T2DM, HTN, anemia who was referred to the ED 12/23 from the cardiology clinic due to volume overload. He had experienced >20 lbs weight gain, appeared with significant dependent edema and dyspnea with CXR confirming pulmonary congestion and pleural effusion. BNP 620, Cr near baseline at 4.1, K 5.4. IV lasix and oral metolazone given with rising creatinine prompting nephrology consultation and initiation of hemodialysis thru HD catheter placed this admission. Routine HD continues, awaiting outpatient hemodialysis chair prior to discharge.   Assessment & Plan: Principal Problem:   Acute on chronic diastolic CHF (congestive heart failure) (HCC) Active Problems:   Essential hypertension   Uncontrolled type 2 diabetes mellitus with hyperglycemia (HCC)   CKD (chronic kidney disease) stage 4, GFR 15-29 ml/min (HCC)   AKI (acute kidney injury) ([place])   Acute exacerbation of CHF (congestive heart failure) ([place])  Acute on chronic HFpEF: Echo Sept 2022 w/LVEF 55-60%, G2DD. Remains grossly volume overloaded.  - Managing volume with HD, and augmented lasix to 80mg  po BID. Next HD 1/9. Weight continues downward trend.  - Continue I/O monitoring, weight checks. Wt down to further today (DC weight previously 278lbs)  Acute hypoxic respiratory failure due to pulmonary edema due to acute CHF as above: Resolved.  AKI on stage IV CKD > now ESRD: - Continue HD thru [facility] placed 12/30. AVF creation also performed at that time by Dr. Trula Slade. IHD today per nephrology. - CLIP process still pending per renal navigator. Seeking in-network site currently. - Continue outpatient follow up with Dr. Theador Hawthorne.   Chronic LLE wound: for several months after scratching dry  area. No evidence of infection, suspect diabetes and swelling are contributing to poor wound healing.  - Unna boots to be continued. No need for abx currently.   HTN:  - Continue labetalol 600mg  BID, hydralazine 100mg  TID. Holding lisinopril, norvasc.   T2DM: Fairly well-controlled with HbA1c 7.1%.  - Continue SSI.  - Holding home glimepiride - Continue statin  OSA: Normal RV on echo. - CPAP qHS has been declined every night since arrival. He does not wear this at home. Diminishes pulmonary reserve.   AOCKD and mild iron deficiency: - Replacing iron stores, nephrology added ESA. Hgb stable.   Obesity: Estimated body mass index is 35.64 kg/m as calculated from the following:   Height as of this encounter: 5\' 11"  (1.803 m).   Weight as of this encounter: 115.9 kg. - BMI has "improved" with volume removal  Hyperkalemia: Resolved.  DVT prophylaxis: Heparin [place] Code Status: Full Family Communication: None at bedside Disposition Plan:  Status is: Inpatient  Remains inpatient appropriate because: Needs outpatient hemodialysis spot.  Consultants:  Cardiology Nephrology Vascular surgery  Procedures:   07/22/21 RIGHT  ARM FISTULA Serafina Mitchell, MD    Antimicrobials: None   Subjective: Seen walking the halls with mobility tech. No complaints, feels fine.  Objective: Vitals:   07/30/21 0611 07/30/21 0740 07/30/21 0936 07/30/21 0940  BP: 138/79 114/61 124/66 124/66  Pulse: 79  82   Resp: 16     Temp: 98 F (36.7 C)     TempSrc: Oral     SpO2: 97%     Weight: 115.9 kg     Height:        Intake/Output Summary (  Last 24 hours) at 07/30/2021 1312 Last data filed at 07/30/2021 9371 Gross per 24 hour  Intake --  Output 320 ml  Net -320 ml   Filed Weights   07/29/21 0809 07/29/21 1156 07/30/21 0611  Weight: 118.5 kg 116 kg 115.9 kg   Gen: 56 y.o. male no distress Pulm: Nonlabored CV: Regular, regular on monitor Ext: Unna boots bilaterally without new  wounds.  Data Reviewed: I have personally reviewed following labs and imaging studies  CBC: Recent Labs  Lab 07/27/21 0648 07/29/21 0832  WBC 6.3 5.8  HGB 9.8* 10.4*  HCT 31.0* 32.6*  MCV 84.5 84.2  PLT 320 696   Basic Metabolic Panel: Recent Labs  Lab 07/26/21 0311 07/27/21 0323 07/28/21 0259 07/29/21 0123 07/30/21 0302  NA 134* 135 131* 133* 134*  K 4.4 4.6 4.4 4.3 4.5  CL 97* 98 96* 96* 99  CO2 26 25 26 26 26   GLUCOSE 150* 121* 117* 110* 129*  BUN 67* 79* 46* 65* 40*  CREATININE 5.15* 5.62* 4.38* 5.37* 4.61*  CALCIUM 8.2* 8.5* 8.4* 8.5* 8.6*  PHOS 5.3* 5.4* 5.1* 5.2* 4.6   GFR: Estimated Creatinine Clearance: 23.2 mL/min (A) (by C-G formula based on SCr of 4.61 mg/dL (H)). Liver Function Tests: Recent Labs  Lab 07/26/21 0311 07/27/21 0323 07/28/21 0259 07/29/21 0123 07/30/21 0302  ALBUMIN 2.8* 2.9* 2.9* 2.8* 3.0*   No results for input(s): LIPASE, AMYLASE in the last 168 hours. No results for input(s): AMMONIA in the last 168 hours. Coagulation Profile: No results for input(s): INR, PROTIME in the last 168 hours. Cardiac Enzymes: No results for input(s): CKTOTAL, CKMB, CKMBINDEX, TROPONINI in the last 168 hours. BNP (last 3 results) No results for input(s): PROBNP in the last 8760 hours. HbA1C: No results for input(s): HGBA1C in the last 72 hours.  CBG: Recent Labs  Lab 07/29/21 1623 07/29/21 1813 07/29/21 2143 07/30/21 0727 07/30/21 1200  GLUCAP 187* 160* 132* 135* 159*   Lipid Profile: No results for input(s): CHOL, HDL, LDLCALC, TRIG, CHOLHDL, LDLDIRECT in the last 72 hours. Thyroid Function Tests: No results for input(s): TSH, T4TOTAL, FREET4, T3FREE, THYROIDAB in the last 72 hours.  Anemia Panel: No results for input(s): VITAMINB12, FOLATE, FERRITIN, TIBC, IRON, RETICCTPCT in the last 72 hours.  Urine analysis:    Component Value Date/Time   COLORURINE YELLOW 06/14/2020 [place] 06/14/2020 1452   LABSPEC 1.010  06/14/2020 1452   PHURINE 5.0 06/14/2020 1452   GLUCOSEU NEGATIVE 06/14/2020 1452   HGBUR NEGATIVE 06/14/2020 1452   BILIRUBINUR NEGATIVE 06/14/2020 1452   KETONESUR NEGATIVE 06/14/2020 1452   PROTEINUR 100 (A) 06/14/2020 1452   NITRITE NEGATIVE 06/14/2020 1452   LEUKOCYTESUR NEGATIVE 06/14/2020 1452   Recent Results (from the past 240 hour(s))  Surgical pcr screen     Status: None   Collection Time: 07/21/21  9:10 PM   Specimen: Nasal Mucosa; Nasal Swab  Result Value Ref Range Status   MRSA, PCR NEGATIVE NEGATIVE Final   Staphylococcus aureus NEGATIVE NEGATIVE Final    Comment: (NOTE) The Xpert SA Assay (FDA approved for NASAL specimens in patients 47 years of age and older), is one component of a comprehensive surveillance program. It is not intended to diagnose infection nor to guide or monitor treatment. Performed at [facility], [place] [address] [place], [place] [address]       Radiology Studies: No results found.  Scheduled Meds:  calcium acetate  667 mg Oral TID WC  Chlorhexidine Gluconate Cloth  6 each Topical Daily   darbepoetin (ARANESP) injection - NON-DIALYSIS  100 mcg Subcutaneous Q Mon-1800   furosemide  80 mg Oral BID   heparin  5,000 Units Subcutaneous Q8H   heparin sodium (porcine)  3,800 Units Intravenous Q M,W,F-HD   hydrALAZINE  100 mg Oral TID   insulin aspart  0-6 Units Subcutaneous TID WC   labetalol  600 mg Oral BID   pravastatin  40 mg Oral Daily   Continuous Infusions:   LOS: 15 days   Patrecia Pour, MD [facility] [email or web address] 07/30/2021, 1:12 PM

## 2021-07-30 NOTE — Progress Notes (Signed)
[facility] Progress Note   56 y.o. 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  Dr. Haroldine Laws rx  Lasix 160 mg IV every 6 hours plus metolazone 5 mg daily with the knowledge that he may need to start dialysis if he does not respond.  Cardiology is also planning to rule out amyloid as well.    He follows with Dr. Theador Hawthorne.  Per nephrology notes his metolazone was increased to 5 mg three times a week at the 06/08/21 office appointment.    Assessment/ Plan:   1)  Acute on chronic diastolic CHF  -Given we have started dialysis we will switch Lasix to 80 mg twice daily as long as UOP is good -Management of fluid with dialysis at this time   2)  AKI on CKD stage IV.  Now ESRD - Cr was 3.74 with GFR 18 in November 2022 and in September GFR below 15 - He follows with an outside practice, Dr. Theador Hawthorne [facility] -  needs access-patient has been hesitant to undergo dialysis and receive access placement.  Now agreed and s/p AVF creation (rt Cimino) and [facility] placement on 12/30; appreciate help from [facility]  Started CLIP process on Tuesday; renal navigator awaiting final approval from [facility]. She has already 1st confirmed that [facility] does not take his current new insurance.  Last HD Wed w/ 3L net UF, Fri 2.5L net UF  Next HD Monday.   3)  HTN  - optimize volume status with diuretics and dialysis as above- - continue current medications   4)  OSA - note decreased respiratory reserve   5) DM type 2 - per primary team    6)  Anemia-  iron stores just a little low, will replete and have added ESA -  Hgb 9.7   7) Hyperkalemia-  resolved 8) Renal osteodystrophy - phos mildly elevated but almost certainly will continue to incr - started on phoslo 1 TIDM; recheck phos early next week  Subjective:   Feels  breathing is better. Denies f/c/n/v/cp. UNNA boots changed  2 nights ago.   Objective:   BP 138/79 (BP Location: Left Arm)    Pulse 79    Temp 98 F (36.7 C) (Oral)    Resp 16    Ht 5\' 11"  (1.803 m)    Wt 115.9 kg    SpO2 97%    BMI 35.64 kg/m   Intake/Output Summary (Last 24 hours) at 07/30/2021 0827 Last data filed at 07/30/2021 9371 Gross per 24 hour  Intake --  Output 2820 ml  Net -2820 ml   Weight change: 0 kg  Physical Exam: General: Supine in  bed, nad HEENT: NCAT Heart: normal rate Lungs: Bilateral chest rise, no increased work of breathing Abdomen: S, obese habitus Extremities: tr-1+ pitting edema bilateral lower extremities, UNNA boots on Skin: chronic venous stasis changes Neuro: alert and oriented x 3 provides hx and follows commands GU no foley Access: RIJ TC + rt CImino (+thrill)    Imaging: No results found.  Labs: BMET Recent Labs  Lab 07/24/21 0255 07/25/21 0429 07/26/21 0311 07/27/21 0323 07/28/21 0259 07/29/21 0123 07/30/21 0302  NA 136 136 134* 135 131* 133* 134*  K 4.5 4.7 4.4 4.6 4.4 4.3 4.5  CL 98 99 97* 98 96* 96* 99  CO2 27 25 26  25 26 26 26   GLUCOSE 132* 124* 150* 121* 117* 110* 129*  BUN 81* 91* 67* 79* 46* 65* 40*  CREATININE 6.12* 6.55* 5.15* 5.62* 4.38* 5.37* 4.61*  CALCIUM 8.0* 8.5* 8.2* 8.5* 8.4* 8.5* 8.6*  PHOS 6.0* 5.9* 5.3* 5.4* 5.1* 5.2* 4.6   CBC Recent Labs  Lab 07/23/21 1215 07/27/21 0648 07/29/21 0832  WBC 7.1 6.3 5.8  HGB 9.7* 9.8* 10.4*  HCT 29.5* 31.0* 32.6*  MCV 83.1 84.5 84.2  PLT 286 320 310    Medications:     calcium acetate  667 mg Oral TID WC   Chlorhexidine Gluconate Cloth  6 each Topical Daily   darbepoetin (ARANESP) injection - NON-DIALYSIS  100 mcg Subcutaneous Q Mon-1800   furosemide  80 mg Oral BID   heparin  5,000 Units Subcutaneous Q8H   heparin sodium (porcine)  3,800 Units Intravenous Q M,W,F-HD   hydrALAZINE  100 mg Oral TID   insulin aspart  0-6 Units Subcutaneous TID WC   labetalol   600 mg Oral BID   pravastatin  40 mg Oral Daily      Otelia Santee, MD 07/30/2021, 8:27 AM

## 2021-07-31 LAB — RENAL FUNCTION PANEL
Albumin: 2.9 g/dL — ABNORMAL LOW (ref 3.5–5.0)
Anion gap: 9 (ref 5–15)
BUN: 59 mg/dL — ABNORMAL HIGH (ref 6–20)
CO2: 27 mmol/L (ref 22–32)
Calcium: 8.6 mg/dL — ABNORMAL LOW (ref 8.9–10.3)
Chloride: 98 mmol/L (ref 98–111)
Creatinine, Ser: 5.89 mg/dL — ABNORMAL HIGH (ref 0.61–1.24)
GFR, Estimated: 11 mL/min — ABNORMAL LOW (ref >60)
Glucose, Bld: 118 mg/dL — ABNORMAL HIGH (ref 70–99)
Phosphorus: 5.3 mg/dL — ABNORMAL HIGH (ref 2.5–4.6)
Potassium: 4.5 mmol/L (ref 3.5–5.1)
Sodium: 134 mmol/L — ABNORMAL LOW (ref 135–145)

## 2021-07-31 LAB — GLUCOSE, CAPILLARY
Glucose-Capillary: 172 mg/dL — ABNORMAL HIGH (ref 70–99)
Glucose-Capillary: 113 mg/dL — ABNORMAL HIGH (ref 70–99)
Glucose-Capillary: 150 mg/dL — ABNORMAL HIGH (ref 70–99)
Glucose-Capillary: 169 mg/dL — ABNORMAL HIGH (ref 70–99)

## 2021-07-31 MED ORDER — DOCUSATE SODIUM 100 MG PO CAPS
200.0000 mg | ORAL_CAPSULE | Freq: Two times a day (BID) | ORAL | Status: DC
  Administered 2021-07-31: 200 mg via ORAL
  Filled 2021-07-31 (×2): qty 2

## 2021-07-31 NOTE — Progress Notes (Signed)
[facility] Progress Note   56 y.o. 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  Dr. Haroldine Laws rx  Lasix 160 mg IV every 6 hours plus metolazone 5 mg daily with the knowledge that he may need to start dialysis if he does not respond.  Cardiology is also planning to rule out amyloid as well.    He follows with Dr. Theador Hawthorne.  Per nephrology notes his metolazone was increased to 5 mg three times a week at the 06/08/21 office appointment.    Assessment/ Plan:   1)  Acute on chronic diastolic CHF  -Given we have started dialysis we will switch Lasix to 80 mg twice daily as long as UOP is good (still making ~1L a day). -Management of fluid primarily with dialysis at this time; the UNNA boots are helping   2)  AKI on CKD stage IV.  Now ESRD - Cr was 3.74 with GFR 18 in November 2022 and in September GFR below 15 - He follows with an outside practice, Dr. Theador Hawthorne [facility] -  needs access-patient has been hesitant to undergo dialysis and receive access placement.  Now agreed and s/p AVF creation (rt Cimino) and [facility] placement on 12/30; appreciate help from [facility]. Beautiful thrill in the rt Cimino.  Started CLIP process on Tuesday; renal navigator awaiting final approval from [facility] She has already 1st confirmed that [facility] does not take his current new insurance.  Last HD Wed w/ 3L net UF, Fri 2.5L net UF  Next HD Monday.   3)  HTN  - optimize volume status with diuretics and dialysis as above- - continue current medications   4)  OSA - note decreased respiratory reserve   5) DM type 2 - per primary team    6)  Anemia-  iron stores just a little low, will replete and have added ESA -  Hgb 9.7   7) Hyperkalemia-  resolved 8) Renal osteodystrophy - phos mildly elevated but almost certainly will  continue to incr - started on phoslo 1 TIDM; will recheck  phos with dialysis tomorrow.  Subjective:   Feels breathing is better. Denies f/c/n/v/cp. UNNA boots changed  3 nights ago.   Objective:   BP (!) 150/76 (BP Location: Left Arm)    Pulse 81    Temp 98.4 F (36.9 C) (Oral)    Resp 16    Ht 5\' 11"  (1.803 m)    Wt 117.2 kg    SpO2 98%    BMI 36.04 kg/m   Intake/Output Summary (Last 24 hours) at 07/31/2021 0817 Last data filed at 07/31/2021 0636 Gross per 24 hour  Intake 240 ml  Output 525 ml  Net -285 ml   Weight change: -1.29 kg  Physical Exam: General: Supine in  bed, nad HEENT: NCAT Heart: normal rate Lungs: Bilateral chest rise, no increased work of breathing Abdomen: S, obese habitus Extremities: tr-1+ pitting edema bilateral lower extremities, UNNA boots on Skin: chronic venous stasis changes Neuro: alert and oriented x 3 provides hx and follows commands GU no foley Access: RIJ TC + rt CImino (+thrill)    Imaging: No results found.  Labs: BMET Recent Labs  Lab 07/25/21 0429 07/26/21 0311 07/27/21 0323 07/28/21 0259 07/29/21 0123 07/30/21 0302 07/31/21 0305  NA 136 134* 135 131* 133* 134* 134*  K  4.7 4.4 4.6 4.4 4.3 4.5 4.5  CL 99 97* 98 96* 96* 99 98  CO2 25 26 25 26 26 26 27   GLUCOSE 124* 150* 121* 117* 110* 129* 118*  BUN 91* 67* 79* 46* 65* 40* 59*  CREATININE 6.55* 5.15* 5.62* 4.38* 5.37* 4.61* 5.89*  CALCIUM 8.5* 8.2* 8.5* 8.4* 8.5* 8.6* 8.6*  PHOS 5.9* 5.3* 5.4* 5.1* 5.2* 4.6 5.3*   CBC Recent Labs  Lab 07/27/21 0648 07/29/21 0832  WBC 6.3 5.8  HGB 9.8* 10.4*  HCT 31.0* 32.6*  MCV 84.5 84.2  PLT 320 310    Medications:     calcium acetate  667 mg Oral TID WC   Chlorhexidine Gluconate Cloth  6 each Topical Daily   darbepoetin (ARANESP) injection - NON-DIALYSIS  100 mcg Subcutaneous Q Mon-1800   furosemide  80 mg Oral BID   heparin  5,000 Units Subcutaneous Q8H   heparin sodium (porcine)  3,800 Units Intravenous Q M,W,F-HD    hydrALAZINE  100 mg Oral TID   insulin aspart  0-6 Units Subcutaneous TID WC   labetalol  600 mg Oral BID   pravastatin  40 mg Oral Daily      Otelia Santee, MD 07/31/2021, 8:17 AM

## 2021-07-31 NOTE — Progress Notes (Signed)
PROGRESS NOTE  [name]  OQH:[id number] DOB: 1965-10-14 DOA: 07/15/2021 PCP: [name], MD  Outpatient Specialists: Cardiology, Dr. Haroldine Laws Brief Narrative: [name] is a 56 y.o. male with a history of chronic HFpEF, stage IV CKD, T2DM, HTN, anemia who was referred to the ED 12/23 from the cardiology clinic due to volume overload. He had experienced >20 lbs weight gain, appeared with significant dependent edema and dyspnea with CXR confirming pulmonary congestion and pleural effusion. BNP 620, Cr near baseline at 4.1, K 5.4. IV lasix and oral metolazone given with rising creatinine prompting nephrology consultation and initiation of hemodialysis thru HD catheter placed this admission. Routine HD continues, awaiting outpatient hemodialysis chair prior to discharge.   Assessment & Plan: Principal Problem:   Acute on chronic diastolic CHF (congestive heart failure) (HCC) Active Problems:   Essential hypertension   Uncontrolled type 2 diabetes mellitus with hyperglycemia (HCC)   CKD (chronic kidney disease) stage 4, GFR 15-29 ml/min (HCC)   AKI (acute kidney injury) ([place])   Acute exacerbation of CHF (congestive heart failure) ([place])  Acute on chronic HFpEF: Echo Sept 2022 w/LVEF 55-60%, G2DD. Remains grossly volume overloaded.  - Managing volume with HD, and augmented lasix to 80mg  po BID. Next HD 1/9. Weight continues downward trend.  - Continue I/O monitoring, weight checks. Wt down to further today (DC weight previously 278lbs)  Acute hypoxic respiratory failure due to pulmonary edema due to acute CHF as above: Resolved.  AKI on stage IV CKD > now ESRD: - Continue HD thru [facility] placed 12/30. AVF creation also performed at that time by Dr. Trula Slade. IHD today per nephrology. - CLIP process still pending per renal navigator. Seeking in-network site currently. - Continue outpatient follow up with Dr. Theador Hawthorne.   Chronic LLE wound: for several months after scratching dry  area. No evidence of infection, suspect diabetes and swelling are contributing to poor wound healing.  - Unna boots to be continued. No need for abx currently.   HTN:  - Continue labetalol 600mg  BID, hydralazine 100mg  TID. Holding lisinopril, norvasc.   T2DM: Fairly well-controlled with HbA1c 7.1%.  - Continue SSI.  - Holding home glimepiride - Continue statin  OSA: Normal RV on echo. - CPAP qHS has been declined every night since arrival. He does not wear this at home. Diminishes pulmonary reserve.   AOCKD and mild iron deficiency: - Replacing iron stores, nephrology added ESA. Hgb stable.   Obesity: Estimated body mass index is 36.04 kg/m as calculated from the following:   Height as of this encounter: 5\' 11"  (1.803 m).   Weight as of this encounter: 117.2 kg. - BMI has "improved" with volume removal  Hyperkalemia: Resolved.  DVT prophylaxis: Heparin [place] Code Status: Full Family Communication: None at bedside Disposition Plan:  Status is: Inpatient  Remains inpatient appropriate because: Needs outpatient hemodialysis spot.  Consultants:  Cardiology Nephrology Vascular surgery  Procedures:   07/22/21 RIGHT  ARM FISTULA Serafina Mitchell, MD    Antimicrobials: None   Subjective: No new issues. Legs continue to get less swollen.  Objective: Vitals:   07/31/21 0629 07/31/21 0635 07/31/21 0830 07/31/21 1024  BP: (!) 150/76  122/64 (!) 159/78  Pulse: 81  80 80  Resp: 16  16   Temp: 98.4 F (36.9 C)  97.9 F (36.6 C)   TempSrc: Oral  Oral   SpO2: 98%  97%   Weight:  117.2 kg    Height:  Intake/Output Summary (Last 24 hours) at 07/31/2021 1156 Last data filed at 07/31/2021 0900 Gross per 24 hour  Intake 420 ml  Output 525 ml  Net -105 ml   Filed Weights   07/29/21 1156 07/30/21 0611 07/31/21 0635  Weight: 116 kg 115.9 kg 117.2 kg   Gen: 56 y.o. male no distress Pulm: Nonlabored, clear CV: RRR Ext: Unna boots bilaterally, swelling significantly  decreased.   Data Reviewed: I have personally reviewed following labs and imaging studies  CBC: Recent Labs  Lab 07/27/21 0648 07/29/21 0832  WBC 6.3 5.8  HGB 9.8* 10.4*  HCT 31.0* 32.6*  MCV 84.5 84.2  PLT 320 751   Basic Metabolic Panel: Recent Labs  Lab 07/27/21 0323 07/28/21 0259 07/29/21 0123 07/30/21 0302 07/31/21 0305  NA 135 131* 133* 134* 134*  K 4.6 4.4 4.3 4.5 4.5  CL 98 96* 96* 99 98  CO2 25 26 26 26 27   GLUCOSE 121* 117* 110* 129* 118*  BUN 79* 46* 65* 40* 59*  CREATININE 5.62* 4.38* 5.37* 4.61* 5.89*  CALCIUM 8.5* 8.4* 8.5* 8.6* 8.6*  PHOS 5.4* 5.1* 5.2* 4.6 5.3*   GFR: Estimated Creatinine Clearance: 18.2 mL/min (A) (by C-G formula based on SCr of 5.89 mg/dL (H)). Liver Function Tests: Recent Labs  Lab 07/27/21 0323 07/28/21 0259 07/29/21 0123 07/30/21 0302 07/31/21 0305  ALBUMIN 2.9* 2.9* 2.8* 3.0* 2.9*   No results for input(s): LIPASE, AMYLASE in the last 168 hours. No results for input(s): AMMONIA in the last 168 hours. Coagulation Profile: No results for input(s): INR, PROTIME in the last 168 hours. Cardiac Enzymes: No results for input(s): CKTOTAL, CKMB, CKMBINDEX, TROPONINI in the last 168 hours. BNP (last 3 results) No results for input(s): PROBNP in the last 8760 hours. HbA1C: No results for input(s): HGBA1C in the last 72 hours.  CBG: Recent Labs  Lab 07/30/21 0727 07/30/21 1200 07/30/21 1621 07/30/21 2053 07/31/21 0755  GLUCAP 135* 159* 163* 175* 172*   Lipid Profile: No results for input(s): CHOL, HDL, LDLCALC, TRIG, CHOLHDL, LDLDIRECT in the last 72 hours. Thyroid Function Tests: No results for input(s): TSH, T4TOTAL, FREET4, T3FREE, THYROIDAB in the last 72 hours.  Anemia Panel: No results for input(s): VITAMINB12, FOLATE, FERRITIN, TIBC, IRON, RETICCTPCT in the last 72 hours.  Urine analysis:    Component Value Date/Time   COLORURINE YELLOW 06/14/2020 [place] 06/14/2020 1452   LABSPEC 1.010  06/14/2020 1452   PHURINE 5.0 06/14/2020 1452   GLUCOSEU NEGATIVE 06/14/2020 1452   HGBUR NEGATIVE 06/14/2020 1452   BILIRUBINUR NEGATIVE 06/14/2020 1452   KETONESUR NEGATIVE 06/14/2020 1452   PROTEINUR 100 (A) 06/14/2020 1452   NITRITE NEGATIVE 06/14/2020 1452   LEUKOCYTESUR NEGATIVE 06/14/2020 1452   Recent Results (from the past 240 hour(s))  Surgical pcr screen     Status: None   Collection Time: 07/21/21  9:10 PM   Specimen: Nasal Mucosa; Nasal Swab  Result Value Ref Range Status   MRSA, PCR NEGATIVE NEGATIVE Final   Staphylococcus aureus NEGATIVE NEGATIVE Final    Comment: (NOTE) The Xpert SA Assay (FDA approved for NASAL specimens in patients 45 years of age and older), is one component of a comprehensive surveillance program. It is not intended to diagnose infection nor to guide or monitor treatment. Performed at [facility], [place] [address] [place], [place] [address]       Radiology Studies: No results found.  Scheduled Meds:  calcium acetate  667 mg Oral  TID WC   Chlorhexidine Gluconate Cloth  6 each Topical Daily   darbepoetin (ARANESP) injection - NON-DIALYSIS  100 mcg Subcutaneous Q Mon-1800   furosemide  80 mg Oral BID   heparin  5,000 Units Subcutaneous Q8H   heparin sodium (porcine)  3,800 Units Intravenous Q M,W,F-HD   hydrALAZINE  100 mg Oral TID   insulin aspart  0-6 Units Subcutaneous TID WC   labetalol  600 mg Oral BID   pravastatin  40 mg Oral Daily   Continuous Infusions:   LOS: 16 days   Patrecia Pour, MD [facility] [email or web address] 07/31/2021, 11:56 AM

## 2021-08-01 LAB — RENAL FUNCTION PANEL
Albumin: 3 g/dL — ABNORMAL LOW (ref 3.5–5.0)
Anion gap: 11 (ref 5–15)
BUN: 75 mg/dL — ABNORMAL HIGH (ref 6–20)
CO2: 26 mmol/L (ref 22–32)
Calcium: 8.6 mg/dL — ABNORMAL LOW (ref 8.9–10.3)
Chloride: 95 mmol/L — ABNORMAL LOW (ref 98–111)
Creatinine, Ser: 6.31 mg/dL — ABNORMAL HIGH (ref 0.61–1.24)
GFR, Estimated: 10 mL/min — ABNORMAL LOW (ref >60)
Glucose, Bld: 156 mg/dL — ABNORMAL HIGH (ref 70–99)
Phosphorus: 5.3 mg/dL — ABNORMAL HIGH (ref 2.5–4.6)
Potassium: 4.6 mmol/L (ref 3.5–5.1)
Sodium: 132 mmol/L — ABNORMAL LOW (ref 135–145)

## 2021-08-01 LAB — CBC
HCT: 32 % — ABNORMAL LOW (ref 39.0–52.0)
Hemoglobin: 10.5 g/dL — ABNORMAL LOW (ref 13.0–17.0)
MCH: 27.2 pg (ref 26.0–34.0)
MCHC: 32.8 g/dL (ref 30.0–36.0)
MCV: 82.9 fL (ref 80.0–100.0)
Platelets: 320 10*3/uL (ref 150–400)
RBC: 3.86 MIL/uL — ABNORMAL LOW (ref 4.22–5.81)
RDW: 14.6 % (ref 11.5–15.5)
WBC: 6.1 10*3/uL (ref 4.0–10.5)
nRBC: 0 % (ref 0.0–0.2)

## 2021-08-01 LAB — GLUCOSE, CAPILLARY
Glucose-Capillary: 131 mg/dL — ABNORMAL HIGH (ref 70–99)
Glucose-Capillary: 230 mg/dL — ABNORMAL HIGH (ref 70–99)
Glucose-Capillary: 186 mg/dL — ABNORMAL HIGH (ref 70–99)
Glucose-Capillary: 238 mg/dL — ABNORMAL HIGH (ref 70–99)

## 2021-08-01 MED ORDER — DARBEPOETIN ALFA 100 MCG/0.5ML IJ SOSY: 100.0000 ug | PREFILLED_SYRINGE | INTRAMUSCULAR | Status: DC

## 2021-08-01 MED ORDER — DARBEPOETIN ALFA 100 MCG/0.5ML IJ SOSY
PREFILLED_SYRINGE | INTRAMUSCULAR | Status: AC
  Administered 2021-08-01: 100 ug via INTRAVENOUS
  Filled 2021-08-01: qty 0.5

## 2021-08-01 MED ORDER — DARBEPOETIN ALFA 100 MCG/0.5ML IJ SOSY
100.0000 ug | PREFILLED_SYRINGE | INTRAMUSCULAR | Status: DC
  Filled 2021-08-01: qty 0.5

## 2021-08-01 NOTE — TOC CM/SW Note (Signed)
HF TOC CM contacted wife for pt's updated insurance information with [employer]. [place], Heart Failure TOC CM [phone]

## 2021-08-01 NOTE — Progress Notes (Addendum)
Contacted [facility] admissions this am to request update on pt's case. Financial clearance is still pending. Will f/u again this afternoon.   Melven Sartorius Renal Navigator [phone]  Addendum at 4:00 pm: Case discussed with Ivin Booty and Jeannene Patella with [facility]. Pt can start at out-pt clinic with insurance clearance still pending per protocol. Pt has been clinically approved for [place] on TTS schedule. Spoke to [place] at clinic who states pt can start tomorrow. Pt will need to arrive at 11:30 to complete paperwork for 12:00 chair time. Contacted nephrologist to provide this update. Contacted attending and treatment team via secure chat to provide update. Pt will d/c early in the am if remains stable overnight. Staff aware pt will need to d/c by 10:30-10:45 in order to get to clinic by 11:30. Met with pt at bedside while receiving HD treatment. Discussed out-pt HD clinic and schedule. Pt provided schedule letter and agreeable to plan. Out-pt arrangements also placed on AVS. Pt aware of plan for early d/c in the am if pt remains stable. Pt states that sister will likely provide transport at d/c and pt to speak with sister this evening re: early d/c to make out-pt appt. Lydia Guiles, NP regarding clinic's need for orders. Will follow and assist.

## 2021-08-01 NOTE — Progress Notes (Signed)
Mobility Specialist Progress Note    08/01/21 1445  Mobility  Activity Off unit;Contraindicated/medical hold   Pt at HD. Will f/u tomorrow.   [facility] Mobility Specialist  M.S. 2C and 6E: [phone] M.S. 4E: [phone]

## 2021-08-01 NOTE — Progress Notes (Signed)
OT Cancellation Note  Patient Details Name: [name] MRN: [id number] DOB: 1966/05/18   Cancelled Treatment:    Reason Eval/Treat Not Completed: Patient at procedure or test/ unavailable. RN reports she is about to take pt to HD. Will follow up as schedule allows.  [name], OTD, OTR/L Acute Rehab [phone]   [name] 08/01/2021, 12:58 PM

## 2021-08-01 NOTE — Progress Notes (Signed)
PROGRESS NOTE  [name]  BCW:[id number] DOB: [birth year] DOA: 07/15/2021 PCP: [name], MD  Outpatient Specialists: Cardiology, Dr. Haroldine Laws Brief Narrative: [name] is a 56 y.o. male with a history of chronic HFpEF, stage IV CKD, T2DM, HTN, anemia who was referred to the ED 12/23 from the cardiology clinic due to volume overload. He had experienced >20 lbs weight gain, appeared with significant dependent edema and dyspnea with CXR confirming pulmonary congestion and pleural effusion. BNP 620, Cr near baseline at 4.1, K 5.4. IV lasix and oral metolazone given with rising creatinine prompting nephrology consultation and initiation of hemodialysis thru HD catheter placed this admission. Routine HD continues, awaiting outpatient hemodialysis chair prior to discharge.   Assessment & Plan: Principal Problem:   Acute on chronic diastolic CHF (congestive heart failure) (HCC) Active Problems:   Essential hypertension   Uncontrolled type 2 diabetes mellitus with hyperglycemia (HCC)   CKD (chronic kidney disease) stage 4, GFR 15-29 ml/min (HCC)   AKI (acute kidney injury) ([place])   Acute exacerbation of CHF (congestive heart failure) ([place])  Acute on chronic HFpEF: Echo Sept 2022 w/LVEF 55-60%, G2DD. Remains grossly volume overloaded.  - Managing volume with HD, and augmented lasix to 80mg  po BID. Next HD 1/9. Weight continues downward trend, rising prior to dialysis today, will monitor. - Continue I/O monitoring, weight checks. (DC weight previously 278lbs)  Acute hypoxic respiratory failure due to pulmonary edema due to acute CHF as above: Resolved.  AKI on stage IV CKD > now ESRD: - Continue HD thru [facility] placed 12/30. AVF creation also performed at that time by Dr. Trula Slade. IHD today per nephrology. - CLIP process still pending per renal navigator. Seeking in-network site currently. - Continue outpatient follow up with Dr. Theador Hawthorne.   Chronic LLE wound: for several months after  scratching dry area. No evidence of infection, suspect diabetes and swelling are contributing to poor wound healing.  - Unna boots to be continued. No need for abx currently.   HTN:  - Continue labetalol 600mg  BID, hydralazine 100mg  TID. Holding lisinopril, norvasc.   PVCs: Labs appear stable. ECG shows no ischemic changes, new PVC x2, improved/shortened QT interval.  - Check Mg in AM - Give labetalol this AM as BP is actually high. Won't limit UF at HD.   T2DM: Fairly well-controlled with HbA1c 7.1%.  - Continue SSI.  - Holding home glimepiride - Continue statin  OSA: Normal RV on echo. - CPAP qHS has been declined. Diminishes pulmonary reserve.   AOCKD and mild iron deficiency: - Replacing iron stores, nephrology added ESA. Hgb stable.   Obesity: Estimated body mass index is 36.3 kg/m as calculated from the following:   Height as of this encounter: 5\' 11"  (1.803 m).   Weight as of this encounter: 118.1 kg. - BMI has "improved" with volume removal  Hyperkalemia: Resolved.  DVT prophylaxis: Heparin [place] Code Status: Full Family Communication: None at bedside Disposition Plan:  Status is: Inpatient  Remains inpatient appropriate because: Needs outpatient hemodialysis spot.  Consultants:  Cardiology Nephrology Vascular surgery  Procedures:   07/22/21 RIGHT  ARM FISTULA Serafina Mitchell, MD    Antimicrobials: None   Subjective: No complaints this morning. Developed palpitations later in the morning prior to dialysis. No chest discomfort or dyspnea reported.   Objective: Vitals:   08/01/21 0551 08/01/21 0755 08/01/21 0819 08/01/21 1135  BP: 133/78 (!) 144/77 (!) 155/87 (!) 155/87  Pulse: 78  82   Resp: 16  Temp: 97.7 F (36.5 C)     TempSrc: Oral     SpO2: 97%     Weight: 118.1 kg     Height:        Intake/Output Summary (Last 24 hours) at 08/01/2021 1400 Last data filed at 08/01/2021 0500 Gross per 24 hour  Intake 360 ml  Output 400 ml  Net -40 ml    Filed Weights   07/30/21 0611 07/31/21 0635 08/01/21 0551  Weight: 115.9 kg 117.2 kg 118.1 kg   Gen: 56 y.o. male in no distress Pulm: Nonlabored breathing room air. Clear. CV: Regular rate and rhythm. Telemetry later reviewed showing increasing PVCs this morning. No JVD, improving dependent edema. Ext: Warm, dry, no deformities Skin: No new rashes, lesions or ulcers on visualized skin. Neuro: Alert and oriented. No focal neurological deficits. Psych: Judgement and insight appear fair. Mood euthymic & affect congruent. Behavior is appropriate.    Data Reviewed: I have personally reviewed following labs and imaging studies  CBC: Recent Labs  Lab 07/27/21 0648 07/29/21 0832 08/01/21 1151  WBC 6.3 5.8 6.1  HGB 9.8* 10.4* 10.5*  HCT 31.0* 32.6* 32.0*  MCV 84.5 84.2 82.9  PLT 320 310 983   Basic Metabolic Panel: Recent Labs  Lab 07/28/21 0259 07/29/21 0123 07/30/21 0302 07/31/21 0305 08/01/21 0307  NA 131* 133* 134* 134* 132*  K 4.4 4.3 4.5 4.5 4.6  CL 96* 96* 99 98 95*  CO2 26 26 26 27 26   GLUCOSE 117* 110* 129* 118* 156*  BUN 46* 65* 40* 59* 75*  CREATININE 4.38* 5.37* 4.61* 5.89* 6.31*  CALCIUM 8.4* 8.5* 8.6* 8.6* 8.6*  PHOS 5.1* 5.2* 4.6 5.3* 5.3*   GFR: Estimated Creatinine Clearance: 17.1 mL/min (A) (by C-G formula based on SCr of 6.31 mg/dL (H)). Liver Function Tests: Recent Labs  Lab 07/28/21 0259 07/29/21 0123 07/30/21 0302 07/31/21 0305 08/01/21 0307  ALBUMIN 2.9* 2.8* 3.0* 2.9* 3.0*   No results for input(s): LIPASE, AMYLASE in the last 168 hours. No results for input(s): AMMONIA in the last 168 hours. Coagulation Profile: No results for input(s): INR, PROTIME in the last 168 hours. Cardiac Enzymes: No results for input(s): CKTOTAL, CKMB, CKMBINDEX, TROPONINI in the last 168 hours. BNP (last 3 results) No results for input(s): PROBNP in the last 8760 hours. HbA1C: No results for input(s): HGBA1C in the last 72 hours.  CBG: Recent Labs   Lab 07/31/21 1237 07/31/21 1658 07/31/21 2157 08/01/21 0641 08/01/21 1142  GLUCAP 113* 150* 169* 131* 230*   Lipid Profile: No results for input(s): CHOL, HDL, LDLCALC, TRIG, CHOLHDL, LDLDIRECT in the last 72 hours. Thyroid Function Tests: No results for input(s): TSH, T4TOTAL, FREET4, T3FREE, THYROIDAB in the last 72 hours.  Anemia Panel: No results for input(s): VITAMINB12, FOLATE, FERRITIN, TIBC, IRON, RETICCTPCT in the last 72 hours.  Urine analysis:    Component Value Date/Time   COLORURINE YELLOW 06/14/2020 [place] 06/14/2020 1452   LABSPEC 1.010 06/14/2020 1452   PHURINE 5.0 06/14/2020 1452   GLUCOSEU NEGATIVE 06/14/2020 1452   HGBUR NEGATIVE 06/14/2020 1452   BILIRUBINUR NEGATIVE 06/14/2020 1452   KETONESUR NEGATIVE 06/14/2020 1452   PROTEINUR 100 (A) 06/14/2020 1452   NITRITE NEGATIVE 06/14/2020 1452   LEUKOCYTESUR NEGATIVE 06/14/2020 1452   No results found for this or any previous visit (from the past 240 hour(s)).     Radiology Studies: No results found.  Scheduled Meds:  calcium acetate  667 mg Oral TID WC  Chlorhexidine Gluconate Cloth  6 each Topical Daily   [START ON 08/03/2021] darbepoetin (ARANESP) injection - DIALYSIS  100 mcg Intravenous Q Wed-HD   docusate sodium  200 mg Oral BID   furosemide  80 mg Oral BID   heparin  5,000 Units Subcutaneous Q8H   heparin sodium (porcine)  3,800 Units Intravenous Q M,W,F-HD   hydrALAZINE  100 mg Oral TID   insulin aspart  0-6 Units Subcutaneous TID WC   labetalol  600 mg Oral BID   pravastatin  40 mg Oral Daily   Continuous Infusions:   LOS: 17 days   Patrecia Pour, MD [facility] [email or web address] 08/01/2021, 2:00 PM

## 2021-08-01 NOTE — Progress Notes (Signed)
[facility] Progress Note     Assessment/ Plan:   1)  Acute on chronic diastolic CHF  -Given we have started dialysis we will switch Lasix to 80 mg twice daily as long as UOP is good (still making ~1L a day). -Management of fluid primarily with dialysis at this time; the UNNA boots are helping   2)  AKI on CKD stage IV.  Now ESRD - Cr was 3.74 with GFR 18 in November 2022 and in September GFR below 15 - He follows with an outside practice, Dr. Theador Hawthorne [facility] -  needs access-patient has been hesitant to undergo dialysis and receive access placement.  Now agreed and s/p AVF creation (rt [place]) and [facility] placement on 12/30; appreciate help from [facility].   Started CLIP process; renal navigator awaiting final approval from [facility]. She has already 1st confirmed that [facility] does not take his current new insurance.  Last HD Wed w/ 3L net UF, Fri 2.5L net UF  Next HD Monday 08/01/21.   3)  HTN  - optimize volume status with diuretics and dialysis as above- - continue current medications   4)  OSA - note decreased respiratory reserve   5) DM type 2 - per primary team    6)  Anemia-  iron stores just a little low, will replete and have added ESA -  Hgb 9.7   7) Hyperkalemia-  resolved 8) Renal osteodystrophy - phos mildly elevated but almost certainly will continue to incr - started on phoslo 1 TIDM; will recheck  phos with dialysis tomorrow.  Subjective:    Had some palpitations overnight, EKG with some PVCs.  Labetalol given.  RRR on monitor now.     Objective:   BP (!) 155/87    Pulse 82    Temp 97.7 F (36.5 C) (Oral)    Resp 16    Ht 5\' 11"  (1.803 m)    Wt 118.1 kg    SpO2 97%    BMI 36.30 kg/m   Intake/Output Summary (Last 24 hours) at 08/01/2021 1412 Last data filed at 08/01/2021 0500 Gross per 24 hour  Intake 360 ml  Output 400 ml  Net -40 ml   Weight change: 0.862 kg  Physical Exam:  General: Sitting up in bed, NAD Heart: normal  rate Lungs: clear bilaterally no c/w/r Abdomen:+ BS Extremities:2+ pitting edema bilateral lower extremities, UNNA boots on Neuro: alert and oriented x 3 provides hx and follows commands Access: RIJ TC + R RC AVF + T/B    Imaging: No results found.  Labs: BMET Recent Labs  Lab 07/26/21 0311 07/27/21 0323 07/28/21 0259 07/29/21 0123 07/30/21 0302 07/31/21 0305 08/01/21 0307  NA 134* 135 131* 133* 134* 134* 132*  K 4.4 4.6 4.4 4.3 4.5 4.5 4.6  CL 97* 98 96* 96* 99 98 95*  CO2 26 25 26 26 26 27 26   GLUCOSE 150* 121* 117* 110* 129* 118* 156*  BUN 67* 79* 46* 65* 40* 59* 75*  CREATININE 5.15* 5.62* 4.38* 5.37* 4.61* 5.89* 6.31*  CALCIUM 8.2* 8.5* 8.4* 8.5* 8.6* 8.6* 8.6*  PHOS 5.3* 5.4* 5.1* 5.2* 4.6 5.3* 5.3*   CBC Recent Labs  Lab 07/27/21 0648 07/29/21 0832 08/01/21 1151  WBC 6.3 5.8 6.1  HGB 9.8* 10.4* 10.5*  HCT 31.0* 32.6* 32.0*  MCV 84.5 84.2 82.9  PLT 320 310 320    Medications:     calcium acetate  667 mg Oral TID WC  Chlorhexidine Gluconate Cloth  6 each Topical Daily   docusate sodium  200 mg Oral BID   furosemide  80 mg Oral BID   heparin  5,000 Units Subcutaneous Q8H   heparin sodium (porcine)  3,800 Units Intravenous Q M,W,F-HD   hydrALAZINE  100 mg Oral TID   insulin aspart  0-6 Units Subcutaneous TID WC   labetalol  600 mg Oral BID   pravastatin  40 mg Oral Daily     Madelon Lips, MD 08/01/2021, 2:12 PM

## 2021-08-02 ENCOUNTER — Other Ambulatory Visit (HOSPITAL_COMMUNITY): Payer: Self-pay

## 2021-08-02 DIAGNOSIS — N250 Renal osteodystrophy: Secondary | ICD-10-CM | POA: Insufficient documentation

## 2021-08-02 DIAGNOSIS — R110 Nausea: Secondary | ICD-10-CM | POA: Insufficient documentation

## 2021-08-02 DIAGNOSIS — L299 Pruritus, unspecified: Secondary | ICD-10-CM | POA: Insufficient documentation

## 2021-08-02 DIAGNOSIS — R0600 Dyspnea, unspecified: Secondary | ICD-10-CM | POA: Insufficient documentation

## 2021-08-02 DIAGNOSIS — D509 Iron deficiency anemia, unspecified: Secondary | ICD-10-CM | POA: Insufficient documentation

## 2021-08-02 DIAGNOSIS — I132 Hypertensive heart and chronic kidney disease with heart failure and with stage 5 chronic kidney disease, or end stage renal disease: Secondary | ICD-10-CM | POA: Insufficient documentation

## 2021-08-02 DIAGNOSIS — E039 Hypothyroidism, unspecified: Secondary | ICD-10-CM | POA: Insufficient documentation

## 2021-08-02 LAB — RENAL FUNCTION PANEL
Albumin: 3.1 g/dL — ABNORMAL LOW (ref 3.5–5.0)
Anion gap: 10 (ref 5–15)
BUN: 49 mg/dL — ABNORMAL HIGH (ref 6–20)
CO2: 25 mmol/L (ref 22–32)
Calcium: 8.5 mg/dL — ABNORMAL LOW (ref 8.9–10.3)
Chloride: 97 mmol/L — ABNORMAL LOW (ref 98–111)
Creatinine, Ser: 4.9 mg/dL — ABNORMAL HIGH (ref 0.61–1.24)
GFR, Estimated: 13 mL/min — ABNORMAL LOW (ref >60)
Glucose, Bld: 143 mg/dL — ABNORMAL HIGH (ref 70–99)
Phosphorus: 3.8 mg/dL (ref 2.5–4.6)
Potassium: 4.2 mmol/L (ref 3.5–5.1)
Sodium: 132 mmol/L — ABNORMAL LOW (ref 135–145)

## 2021-08-02 LAB — MAGNESIUM: Magnesium: 2 mg/dL (ref 1.7–2.4)

## 2021-08-02 LAB — GLUCOSE, CAPILLARY: Glucose-Capillary: 189 mg/dL — ABNORMAL HIGH (ref 70–99)

## 2021-08-02 MED ORDER — CALCIUM ACETATE (PHOS BINDER) 667 MG PO CAPS
667.0000 mg | ORAL_CAPSULE | Freq: Three times a day (TID) | ORAL | 0 refills | Status: DC
  Filled 2021-08-02: qty 90, 30d supply, fill #0

## 2021-08-02 MED ORDER — HYDRALAZINE HCL 100 MG PO TABS
100.0000 mg | ORAL_TABLET | Freq: Three times a day (TID) | ORAL | 0 refills | Status: DC
  Filled 2021-08-02: qty 90, 30d supply, fill #0

## 2021-08-02 MED ORDER — FUROSEMIDE 80 MG PO TABS
80.0000 mg | ORAL_TABLET | Freq: Two times a day (BID) | ORAL | 0 refills | Status: DC
  Filled 2021-08-02: qty 60, 30d supply, fill #0

## 2021-08-02 NOTE — Progress Notes (Signed)
Contacted by ortho for patient to have unna boots changed. Pt going for dialysis at this time.

## 2021-08-02 NOTE — Progress Notes (Signed)
Orthopedic Tech Progress Note Patient Details:  CAINEN BURNHAM 1965/11/11 [id number]  Patient ID: [name], male   DOB: [birth year], 56 y.o.   MRN: [id number] Requested that RN call once old unna boots had been removed and legs cleaned on 08/01/2021, so we could come apply a new pair. Will reach out again today.  Vernona Rieger 08/02/2021, 8:22 AM

## 2021-08-02 NOTE — Progress Notes (Signed)
Contacted [place] and spoke to [place]. Clinic confirms that orders have been received. Clinic aware pt has d/c orders and plan is for pt to arrive at 11:30 to complete paperwork. Contacted pt's RN to make sure she is aware of plan. Attempted to reach pt via phone but was unable to reach him.   Melven Sartorius Renal Navigator [phone]

## 2021-08-02 NOTE — Discharge Summary (Signed)
Physician Discharge Summary  [name] ZSW:[id number] DOB: 1965-10-06 DOA: 07/15/2021  PCP: [name], MD  Admit date: 07/15/2021 Discharge date: 08/02/2021  Admitted From: Home Disposition: Home   Recommendations for Outpatient Follow-up:  Follow up with nephrology, Dr. Theador Hawthorne after discharge.  Weight at discharge is 114.6kg (139.7kg on admission). Follow up with heart failure clinic after discharge.  Home Health: PT, OT Equipment/Devices: None new Discharge Condition: Stable CODE STATUS: Full Diet recommendation: Renal  Brief/Interim Summary: [name] is a 56 y.o. male with a history of chronic HFpEF, stage IV CKD, T2DM, HTN, anemia who was referred to the ED 12/23 from the cardiology clinic due to volume overload. He had experienced >20 lbs weight gain, appeared with significant dependent edema and dyspnea with CXR confirming pulmonary congestion and pleural effusion. BNP 620, Cr near baseline at 4.1, K 5.4. IV lasix and oral metolazone given with rising creatinine prompting nephrology consultation and initiation of hemodialysis thru HD catheter placed this admission. Routine HD continues, awaiting outpatient hemodialysis chair prior to discharge.   Discharge Diagnoses:  Principal Problem:   Acute on chronic diastolic CHF (congestive heart failure) (HCC) Active Problems:   Essential hypertension   Uncontrolled type 2 diabetes mellitus with hyperglycemia (HCC)   CKD (chronic kidney disease) stage 4, GFR 15-29 ml/min (HCC)   AKI (acute kidney injury) ([place])   Acute exacerbation of CHF (congestive heart failure) ([place])  Acute on chronic HFpEF: Echo Sept 2022 w/LVEF 55-60%, G2DD. Remains grossly volume overloaded.  - Managing volume with HD, and lasix to 80mg  po BID. Next HD on day of discharge 1/10 at outpatient spot.. Weight continues downward trend. - Continue I/O monitoring, weight checks discussed with patient.   Acute hypoxic respiratory failure due to  pulmonary edema due to acute CHF as above: Resolved.   AKI on stage IV CKD > now ESRD: - Continue HD thru [facility] placed 12/30. AVF creation also performed at that time by Dr. Trula Slade. IHD today per nephrology. - CLIP process still pending per renal navigator. Seeking in-network site currently. - Continue outpatient follow up with Dr. Theador Hawthorne.    Chronic LLE wound: for several months after scratching dry area. No evidence of infection, suspect diabetes and swelling are contributing to poor wound healing.  - Unna boots to be continued. No need for abx currently.    HTN:  - Continue labetalol 600mg  BID, hydralazine 100mg  TID. Holding lisinopril, norvasc.    PVCs: Labs appear stable. ECG shows no ischemic changes, new PVC x2, improved/shortened QT interval. With continuation of labetalol, these have decreased over 24 hours prior to discharge.    T2DM: Fairly well-controlled with HbA1c 7.1%.  - Continue home glimepiride - Continue statin   OSA: Normal RV on echo. - CPAP qHS has been declined. Diminishes pulmonary reserve.    AOCKD and mild iron deficiency: - Replacing iron stores, nephrology added ESA. Hgb stable.    Obesity: Weight at discharge is 114.6kg - BMI has "improved" with volume removal   Hyperkalemia: Resolved.  Discharge Instructions Discharge Instructions     Diet Carb Modified   Complete by: As directed    Discharge instructions   Complete by: As directed    You were admitted for volume overload and ultimately improved after initiation of hemodialysis. You will continue this Tuesday, Thursday, Saturday and will need to follow up with Dr. Theador Hawthorne regularly. You have a follow up appointment on Friday with cardiology as well. Please note some medication changes recommended:  -  Stop taking lisinopril - Stop taking norvasc - Increase hydralazine from 75 mg to 100mg  three times a day - Change torsemide to lasix twice a day.  - Phoslo was added and prescribed at discharge -  If you have chest pain, shortness of breath, worse swelling, fever, or new pain around the site of the HD catheter, seek medical attention right away. Otherwise, also follow up with your primary doctor in the next 1-2 weeks.   Increase activity slowly   Complete by: As directed    No wound care   Complete by: As directed       Allergies as of 08/02/2021   No Known Allergies      Medication List     STOP taking these medications    amLODipine 10 MG tablet Commonly known as: NORVASC   lisinopril 10 MG tablet Commonly known as: ZESTRIL   metolazone 5 MG tablet Commonly known as: ZAROXOLYN   torsemide 100 MG tablet Commonly known as: DEMADEX       TAKE these medications    acetaminophen 325 MG tablet Commonly known as: TYLENOL Take 2 tablets (650 mg total) by mouth every 6 (six) hours as needed for mild pain (or Fever >/= 101).   calcium acetate 667 MG capsule Commonly known as: PHOSLO Take 1 capsule (667 mg total) by mouth 3 (three) times daily with meals.   furosemide 80 MG tablet Commonly known as: LASIX Take 1 tablet (80 mg total) by mouth 2 (two) times daily.   Garlic Oil 6599 MG Caps Take 100 mg by mouth every morning.   glimepiride 4 MG tablet Commonly known as: AMARYL Take 0.5 tablets (2 mg total) by mouth 2 (two) times daily.   hydrALAZINE 100 MG tablet Commonly known as: APRESOLINE Take 1 tablet (100 mg total) by mouth 3 (three) times daily. What changed:  medication strength how much to take   labetalol 300 MG tablet Commonly known as: NORMODYNE Take 2 tablets (600 mg total) by mouth 2 (two) times daily.   multivitamin with minerals Tabs tablet Take 1 tablet by mouth daily.   pravastatin 40 MG tablet Commonly known as: PRAVACHOL Take 40 mg by mouth in the morning.   Vitamin D (Ergocalciferol) 1.25 MG (50000 UNIT) Caps capsule Commonly known as: DRISDOL Take 50,000 Units by mouth every Friday.   Zinc 50 MG Caps Take 50 mg by mouth  every morning.               Durable Medical Equipment  (From admission, onward)           Start     Ordered   07/27/21 1523  For home use only DME 4 wheeled rolling walker with seat  Once       Question:  Patient needs a walker to treat with the following condition  Answer:  CHF (congestive heart failure) ([place])   07/27/21 1522            Follow-up Information     [facility] Follow up on 08/05/2021.   Specialty: Cardiology Why: Advanced Heart Failure Clinic at [facility] 9 am Entrance C Contact information: [address] [id number] [place] Follow up.   Why: Schedule is Tuesday/Thursday/Saturday. Patient to begin on August 02, 2021. Arrive at 11:30 for paperwork. Chair time is 12:00. Contact information: [place]  [place] [address] [phone]         [name], MD Follow up.   Specialty: Family Medicine Contact information: [place] [address] [phone]         Liana Gerold, MD Follow up.   Specialty: Nephrology Contact information: [address] [address] [phone]                No Known Allergies  Consultations: Nephrology HF team Vascular surgery  Procedures/Studies: DG Chest 2 View  Result Date: 07/15/2021 CLINICAL DATA:  Provided history: Shortness of breath. Additional history provided: Patient with history of hypertension, type 2 diabetes, CKD stage 3 and chronic diastolic CHF. Exertional dyspnea. EXAM: CHEST - 2 VIEW COMPARISON:  Prior chest radiographs 04/16/2021 and earlier. FINDINGS: Shallow inspiration radiograph. Cardiomegaly, unchanged. Central pulmonary vascular congestion and interstitial edema. Similar to the prior examination of 04/16/2021, there is a small to moderate right pleural effusion  partly tracking into the minor fissure. Associated right basilar atelectasis and/or consolidation. Suspected trace left pleural effusion. No evidence of pneumothorax. No acute bony abnormality identified. IMPRESSION: Cardiomegaly, unchanged. Central pulmonary vascular congestion and interstitial edema. Similar to the prior examination of 04/16/2021, there is a small-to-moderate right pleural effusion partly tracking into the minor fissure. Associated right basilar atelectasis and/or consolidation. Suspected trace left pleural effusion. Electronically Signed   By: Kellie Simmering D.O.   On: 07/15/2021 13:33   NM CARDIAC AMYLOID TUMOR LOC INFLAM SPECT 1 DAY  Result Date: 07/20/2021 CLINICAL DATA:  HEART FAILURE. CONCERN FOR CARDIAC AMYLOIDOSIS. EXAM: NUCLEAR MEDICINE TUMOR LOCALIZATION. PYP CARDIAC AMYLOIDOSIS SCAN WITH SPECT TECHNIQUE: Following intravenous administration of radiopharmaceutical, anterior planar images of the chest were obtained. Regions of interest were placed on the heart and contralateral chest wall for quantitative assessment. Additional SPECT imaging of the chest was obtained. RADIOPHARMACEUTICALS:  20.7 mCi TECHNETIUM 99 PYROPHOSPHATE FINDINGS: Planar Visual assessment: Anterior planar imaging demonstrates radiotracer uptake within the heart greater than uptake within the adjacent ribs (Grade 2). Quantitative assessment : Quantitative assessment of the cardiac uptake compared to the contralateral chest wall is equal to (H/CL = 1.18). SPECT assessment: SPECT imaging of the chest demonstrates mild radiotracer accumulation within the LEFT ventricle. IMPRESSION: Visual and quantitative assessment (grade 2, H/CLL equal 1.18) are strongly suggestive of transthyretin amyloidosis. Electronically Signed   By: Kerby Moors M.D.   On: 07/20/2021 16:14   DG CHEST PORT 1 VIEW  Result Date: 07/22/2021 CLINICAL DATA:  Shortness of breath, CHF EXAM: PORTABLE CHEST 1 VIEW COMPARISON:  07/15/2021  FINDINGS: Transverse diameter of heart is increased. Central pulmonary vessels are more prominent. There is increased haziness in the right parahilar region. There is blunting of right lateral CP angle. There is improvement in aeration of right lower lung fields. Left lateral CP angle is clear. There is no pneumothorax. There is interval placement of dialysis catheter with its tip in the right atrium. IMPRESSION: Cardiomegaly. Central pulmonary vessels are prominent suggesting CHF. Haziness in the right mid and right lower lung fields suggests moderate right pleural effusion. There is improvement in aeration of right lower lung fields since 07/15/2021, possibly suggesting decrease in pleural effusion and decrease in atelectasis. Electronically Signed   By: Elmer Picker M.D.   On: 07/22/2021 13:38   DG C-Arm 1-60 Min-No Report  Result Date: 07/22/2021 Fluoroscopy was utilized by the requesting physician.  No radiographic interpretation.   VAS [place] UPPER EXT VEIN MAPPING (PRE-OP AVF)  Result Date: 07/21/2021  [place] MAPPING Patient Name:  [name]  Date of Exam:   07/21/2021 Medical Rec #: [id number]        Accession #:    [id number] Date of Birth: 08/02/1965        Patient Gender: M Patient Age:   10 years Exam Location:  [facility] Procedure:      VAS [place] UPPER EXT VEIN MAPPING (PRE-OP AVF) Referring Phys: Dagoberto Ligas --------------------------------------------------------------------------------  Indications: Pre-access. Comparison Study: No prior studies. Performing Technologist: Oliver Hum RVT  Examination Guidelines: A complete evaluation includes B-mode imaging, spectral Doppler, color Doppler, and power Doppler as needed of all accessible portions of each vessel. Bilateral testing is considered an integral part of a complete examination. Limited examinations for reoccurring indications may be performed as noted.  +-----------------+-------------+----------+---------+  Right Cephalic    Diameter (cm) Depth (cm) Findings   +-----------------+-------------+----------+---------+  Shoulder              0.45         1.90               +-----------------+-------------+----------+---------+  Prox upper arm        0.45         0.44               +-----------------+-------------+----------+---------+  Mid upper arm         0.43         0.27               +-----------------+-------------+----------+---------+  Dist upper arm        0.57         0.35               +-----------------+-------------+----------+---------+  Antecubital fossa     0.67         0.26    branching  +-----------------+-------------+----------+---------+  Prox forearm          0.44         0.29    branching  +-----------------+-------------+----------+---------+  Mid forearm           0.41         0.23    branching  +-----------------+-------------+----------+---------+  Dist forearm          0.37         0.26               +-----------------+-------------+----------+---------+ +-----------------+-------------+----------+---------+  Left Cephalic     Diameter (cm) Depth (cm) Findings   +-----------------+-------------+----------+---------+  Shoulder              0.49         2.30               +-----------------+-------------+----------+---------+  Prox upper arm        0.37         1.50               +-----------------+-------------+----------+---------+  Mid upper arm         0.34         0.43               +-----------------+-------------+----------+---------+  Dist upper arm        0.40         0.42               +-----------------+-------------+----------+---------+  Antecubital fossa     0.50  0.32    branching  +-----------------+-------------+----------+---------+  Prox forearm          0.34         0.40    branching  +-----------------+-------------+----------+---------+  Mid forearm           0.47         0.28    branching   +-----------------+-------------+----------+---------+  Dist forearm          0.45         0.27               +-----------------+-------------+----------+---------+ *See table(s) above for measurements and observations.  Diagnosing physician: Harold Barban MD Electronically signed by Harold Barban MD on 07/21/2021 at 8:02:37 PM.    Final      07/22/21 RIGHT  ARM FISTULA Serafina Mitchell, MD   Subjective: Feels well  Discharge Exam: Vitals:   08/01/21 2146 08/02/21 0621  BP: (!) 145/73 (!) 145/86  Pulse:  78  Resp:  18  Temp:  98.1 F (36.7 C)  SpO2:     General: Pt is alert, awake, not in acute distress Cardiovascular: RRR, S1/S2 +, no rubs, no gallops Respiratory: CTA bilaterally, no wheezing, no rhonchi Abdominal: Soft, NT, ND, bowel sounds + Extremities: Minimal edema, no cyanosis  Labs: BNP (last 3 results) Recent Labs    06/24/21 1250 07/15/21 1130 07/15/21 1259  BNP 615.1* 638.5* 335.4*   Basic Metabolic Panel: Recent Labs  Lab 07/29/21 0123 07/30/21 0302 07/31/21 0305 08/01/21 0307 08/02/21 0059  NA 133* 134* 134* 132* 132*  K 4.3 4.5 4.5 4.6 4.2  CL 96* 99 98 95* 97*  CO2 26 26 27 26 25   GLUCOSE 110* 129* 118* 156* 143*  BUN 65* 40* 59* 75* 49*  CREATININE 5.37* 4.61* 5.89* 6.31* 4.90*  CALCIUM 8.5* 8.6* 8.6* 8.6* 8.5*  MG  --   --   --   --  2.0  PHOS 5.2* 4.6 5.3* 5.3* 3.8   Liver Function Tests: Recent Labs  Lab 07/29/21 0123 07/30/21 0302 07/31/21 0305 08/01/21 0307 08/02/21 0059  ALBUMIN 2.8* 3.0* 2.9* 3.0* 3.1*   CBC: Recent Labs  Lab 07/27/21 0648 07/29/21 0832 08/01/21 1151  WBC 6.3 5.8 6.1  HGB 9.8* 10.4* 10.5*  HCT 31.0* 32.6* 32.0*  MCV 84.5 84.2 82.9  PLT 320 310 320   CBG: Recent Labs  Lab 07/31/21 2157 08/01/21 0641 08/01/21 1142 08/01/21 1845 08/01/21 2135  GLUCAP 169* 131* 230* 186* 238*   Time coordinating discharge: Approximately 40 minutes  Patrecia Pour, MD  [facility] 08/02/2021, 6:35 AM

## 2021-08-02 NOTE — TOC CM/SW Note (Addendum)
HF TOC CM spoke to pt at bedside and wife via phone. She will take him to his dialysis treatment today. Wife provided front of new insurance card and will send back to CM to process RW with seat. She agreeable to RW being delivered to home. [place] and they are out of network with [employer]. Contacted [employer] rep, [place] rep, they will deliver to home. Faxed insurance information to [employer]. [place], Ramond Marrow for new referral for HHPT. Sent rep new insurance information.    [place], Heart Failure TOC CM [phone]

## 2021-08-02 NOTE — Progress Notes (Signed)
Patient experiencing heart palpitations and PVCs seen on heart monitor. Paged Dr. Bonner Puna. Advised to perform EKG. After his review, advised to administer pt's morning scheduled labetalol and okay for patient to go to dialysis.

## 2021-08-04 ENCOUNTER — Telehealth (HOSPITAL_COMMUNITY): Payer: Self-pay

## 2021-08-04 NOTE — Telephone Encounter (Signed)
Called and was unable to leave patient a voice message to confirm/remind patient of their appointment at the [facility] on 08/05/20.

## 2021-08-05 ENCOUNTER — Other Ambulatory Visit (HOSPITAL_COMMUNITY): Payer: Self-pay

## 2021-08-05 ENCOUNTER — Other Ambulatory Visit: Payer: Self-pay

## 2021-08-05 ENCOUNTER — Encounter (HOSPITAL_COMMUNITY): Payer: Self-pay

## 2021-08-05 ENCOUNTER — Ambulatory Visit (HOSPITAL_COMMUNITY)
Admit: 2021-08-05 | Discharge: 2021-08-05 | Disposition: A | Discharge status: Home / Self Care | Payer: 59 | Attending: Family Medicine | Admitting: Family Medicine

## 2021-08-05 VITALS — BP 140/70 | HR 80 | Wt 257.0 lb

## 2021-08-05 DIAGNOSIS — G4733 Obstructive sleep apnea (adult) (pediatric): Secondary | ICD-10-CM | POA: Diagnosis not present

## 2021-08-05 DIAGNOSIS — N183 Chronic kidney disease, stage 3 unspecified: Secondary | ICD-10-CM | POA: Diagnosis not present

## 2021-08-05 DIAGNOSIS — R0600 Dyspnea, unspecified: Secondary | ICD-10-CM | POA: Diagnosis not present

## 2021-08-05 DIAGNOSIS — N186 End stage renal disease: Secondary | ICD-10-CM

## 2021-08-05 DIAGNOSIS — I43 Cardiomyopathy in diseases classified elsewhere: Secondary | ICD-10-CM

## 2021-08-05 DIAGNOSIS — Z79899 Other long term (current) drug therapy: Secondary | ICD-10-CM | POA: Insufficient documentation

## 2021-08-05 DIAGNOSIS — Z992 Dependence on renal dialysis: Secondary | ICD-10-CM

## 2021-08-05 DIAGNOSIS — E854 Organ-limited amyloidosis: Secondary | ICD-10-CM

## 2021-08-05 DIAGNOSIS — I5032 Chronic diastolic (congestive) heart failure: Secondary | ICD-10-CM | POA: Insufficient documentation

## 2021-08-05 DIAGNOSIS — E1122 Type 2 diabetes mellitus with diabetic chronic kidney disease: Secondary | ICD-10-CM | POA: Diagnosis not present

## 2021-08-05 DIAGNOSIS — E1121 Type 2 diabetes mellitus with diabetic nephropathy: Secondary | ICD-10-CM

## 2021-08-05 DIAGNOSIS — I132 Hypertensive heart and chronic kidney disease with heart failure and with stage 5 chronic kidney disease, or end stage renal disease: Secondary | ICD-10-CM | POA: Diagnosis not present

## 2021-08-05 DIAGNOSIS — I10 Essential (primary) hypertension: Secondary | ICD-10-CM

## 2021-08-05 NOTE — Progress Notes (Signed)
PCP: [name], MD Cardiology: Dr. Harl Bowie HF Cardiology: Dr. Aundra Dubin  56 y.o. with history of HTN, type 2 diabetes, CKD stage 3, and chronic diastolic CHF was referred by Dr. Harl Bowie for evaluation of diastolic CHF.  For > 1 year now, patient has had significant exertional dyspnea.  He has been admitted several times in the last year, most recently in 3/22.  Echo in 9/21 showed EF 50%, moderate LVH, grade 2 diastolic dysfunction, normal RV size and systolic function.  Creatinine elevated, he is followed by nephrology.  Echo in 9/22 with EF 55-60%, moderate LVH.   Patient was admitted in 9/22 with CHF exacerbation.   Volume overloaded at post hospital follow up 06/24/21. Metolazone increased to 5 mg 5 days a week. Carvedilol stopped as he had been taking this with labetalol. Lisinopril subsequently stopped when labs showed SCr 5.6.  Admitted 07/15/21 from clinic with CHF exacerbation. Dialysis initiated this admission, s/p AVF creation. PYP scan suggests TTR cardiac amyloidosis. MM panel negative. Lasix 80 bid continued at discharge per renal. Down 56 lbs.  Today he returns for post hospital HF follow up with his wife. Overall feeling fine, fatigued walking further distances on flat ground. Occasional dizziness when walking, no syncope. Denies palpitations, CP, edema, or PND/Orthopnea. Appetite ok. No fever or chills. Taking all medications. He is urinating every other day. Seems to be tolerating HD well, no reports of low BP on dialysis.  Labs (5/22): K 4.5, creatinine 2.65, myeloma panel negative Labs (6/22): K 4.7, creatinine 4.4, BUN 77, urine immunofixation negative Labs (11/22): K 4.9, creatinine 3.74 Labs (12/22): K 5.6, creatinine 4.18  ECG (personally reviewed): SR w/ PVC 88 bpm  PMH; 1. HTN 2. Type 2 diabetes 3. ESRD: Suspect diabetic nephropathy.   4. Chronic diastolic CHF: Echo in 3/76 with EF 50%, moderate LVH, grade 2 diastolic dysfunction, normal RV size and systolic  function.  - Echo (9/22): EF 55-60%, moderate LVH, RV normal - PYP (12/22): Visual and quantitative assessment (grade 2, H/CLL equal 1.18) are strongly suggestive of transthyretin amyloidosis.  Social History   Socioeconomic History   Marital status: Married    Spouse name: Not on file   Number of children: Not on file   Years of education: Not on file   Highest education level: Not on file  Occupational History   Not on file  Tobacco Use   Smoking status: Never   Smokeless tobacco: Never  Vaping Use   Vaping Use: Never used  Substance and Sexual Activity   Alcohol use: No   Drug use: No   Sexual activity: Not on file  Other Topics Concern   Not on file  Social History Narrative   Not on file   Social Determinants of Health   Financial Resource Strain: High Risk   Difficulty of Paying Living Expenses: Hard  Food Insecurity: Food Insecurity Present   Worried About Running Out of Food in the Last Year: Sometimes true   Ran Out of Food in the Last Year: Never true  Transportation Needs: No Transportation Needs   Lack of Transportation (Medical): No   Lack of Transportation (Non-Medical): No  Physical Activity: Not on file  Stress: Not on file  Social Connections: Not on file  Intimate Partner Violence: Not on file   Family History  Problem Relation Age of Onset   Dementia Mother    Diabetes Father    ROS: All systems reviewed and negative except as per HPI.   Current  Outpatient Medications  Medication Sig Dispense Refill   acetaminophen (TYLENOL) 325 MG tablet Take 2 tablets (650 mg total) by mouth every 6 (six) hours as needed for mild pain (or Fever >/= 101). 12 tablet 1   calcium acetate (PHOSLO) 667 MG capsule Take 1 capsule (667 mg total) by mouth 3 (three) times daily with meals. 90 capsule 0   furosemide (LASIX) 80 MG tablet Take 1 tablet (80 mg total) by mouth 2 (two) times daily. 60 tablet 0   Garlic Oil 8250 MG CAPS Take 100 mg by mouth every morning.      glimepiride (AMARYL) 4 MG tablet Take 0.5 tablets (2 mg total) by mouth 2 (two) times daily.     hydrALAZINE (APRESOLINE) 100 MG tablet Take 1 tablet (100 mg total) by mouth 3 (three) times daily. 90 tablet 0   labetalol (NORMODYNE) 300 MG tablet Take 2 tablets (600 mg total) by mouth 2 (two) times daily. 60 tablet 6   Multiple Vitamin (MULTIVITAMIN WITH MINERALS) TABS tablet Take 1 tablet by mouth daily.     pravastatin (PRAVACHOL) 40 MG tablet Take 40 mg by mouth in the morning.     Vitamin D, Ergocalciferol, (DRISDOL) 1.25 MG (50000 UNIT) CAPS capsule Take 50,000 Units by mouth every Friday.     Zinc 50 MG CAPS Take 50 mg by mouth every morning.     No current facility-administered medications for this encounter.   BP 140/70    Pulse 80    Wt 116.6 kg    SpO2 95%    BMI 35.84 kg/m   Wt Readings from Last 3 Encounters:  08/05/21 116.6 kg  08/02/21 114.6 kg  07/15/21 (!) 139.8 kg   General:  NAD. No resp difficulty, walked into clinic HEENT: Normal Neck: Supple. No JVD. Carotids 2+ bilat; no bruits. No lymphadenopathy or thryomegaly appreciated. Cor: PMI nondisplaced. Regular rate & rhythm. No rubs, gallops or murmurs. Lungs: Clear, TDC looks good. Abdomen: Obese, nontender, nondistended. No hepatosplenomegaly. No bruits or masses. Good bowel sounds. Extremities: No cyanosis, clubbing, rash, edema; R arm AVF +/+ Neuro: Alert & oriented x 3, cranial nerves grossly intact. Moves all 4 extremities w/o difficulty. Affect pleasant.  Assessment/Plan: 1. Chronic diastolic CHF: Most recent echo in 9/22 showed EF 55-60%, moderate LVH, grade 2 diastolic dysfunction, normal RV size and systolic function.  Multiple admissions for CHF.  Most recent 12/22, dialysis initiated. 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 Multiple panel negative. Today, NYHA III, volume now managed by dialysis but he is not overloaded today. - Continue Lasix 80 mg bid (per nephrology) - No ARB/ARNi/spiro/SGLT2i with  ESRD. - Will arrange genetic testing today and engaged HF PharmD to start process for initiation of Tafamadis. - Will order serum/urine protein immunofixation. 2. ESRD: New as of 12/22 admission. S/p AVF creation and TDC placement. - He followed with Dr. Theador Hawthorne outpatient. - Dialysis T/Th/Sat at [facility] in [place]. - Making urine every other day, on Lasix. 3. OSA: Refuses CPAP. 4. HTN: BP mildly elevated today. He has not been on dialysis very long, I am hesitant to add another agent and risk compromised volume removal due to low BP. - Monitor BP, If BP continues to be elevated, would add amlodipine 5. - Continue labetalol 600 mg bid. - Continue hydralazine 100 mg tid.  5. DM2: a1C 7.1 12/22. - On glimepride. - Per PCP.  Follow up with Dr. Aundra Dubin in 4-6 weeks for Tafamadis. Pharmacy assisting with PA. Unclear if we will  be able to initiate Vyndamax with HD, pharmacy helping with this.  Allena Katz, FNP-BC 08/05/21

## 2021-08-05 NOTE — Patient Instructions (Signed)
It was great to see you today! No medication changes are needed at this time.   Labs today We will only contact you if something comes back abnormal or we need to make some changes. Otherwise no news is good news!  Your physician recommends that you schedule a follow-up appointment in: 4-6 weeks with Dr Aundra Dubin   At the [facility], you and your health needs are our priority. As part of our continuing mission to provide you with exceptional heart care, we have created designated Provider Care Teams. These Care Teams include your primary Cardiologist (physician) and Advanced Practice Providers (APPs- Physician Assistants and Nurse Practitioners) who all work together to provide you with the care you need, when you need it.   You may see any of the following providers on your designated Care Team at your next follow up: Dr Glori Bickers Dr Haynes Kerns, NP Lyda Jester, [place] [facility] [place], [place] Audry Riles, PharmD   Please be sure to bring in all your medications bottles to every appointment.   Do the following things EVERYDAY: Weigh yourself in the morning before breakfast. Write it down and keep it in a log. Take your medicines as prescribed Eat low salt foods--Limit salt (sodium) to 2000 mg per day.  Stay as active as you can everyday Limit all fluids for the day to less than 2 liters  If you have any questions or concerns before your next appointment please send [place] a message through [place] or call our office at [phone].    TO LEAVE A MESSAGE FOR THE NURSE SELECT OPTION 2, PLEASE LEAVE A MESSAGE INCLUDING: YOUR NAME DATE OF BIRTH CALL BACK NUMBER REASON FOR CALL**this is important as we prioritize the call backs  YOU WILL RECEIVE A CALL BACK THE SAME DAY AS LONG AS YOU CALL BEFORE 4:00 PM

## 2021-08-09 ENCOUNTER — Other Ambulatory Visit: Payer: Self-pay

## 2021-08-09 DIAGNOSIS — N184 Chronic kidney disease, stage 4 (severe): Secondary | ICD-10-CM

## 2021-08-23 ENCOUNTER — Encounter (HOSPITAL_COMMUNITY): Payer: Self-pay

## 2021-08-23 ENCOUNTER — Ambulatory Visit: Payer: Commercial Managed Care - PPO | Admitting: Cardiology

## 2021-08-26 ENCOUNTER — Encounter (HOSPITAL_COMMUNITY): Payer: Self-pay

## 2021-09-30 ENCOUNTER — Encounter (HOSPITAL_COMMUNITY): Payer: 59 | Admitting: Cardiology

## 2021-10-13 ENCOUNTER — Telehealth (HOSPITAL_COMMUNITY): Payer: Self-pay

## 2021-10-13 NOTE — Telephone Encounter (Signed)
Had question about his dialysis catheter. I advise him to call his Kidney doctor and asked them as they are the ones who look after it ?

## 2021-11-14 ENCOUNTER — Telehealth: Payer: Self-pay

## 2021-11-14 NOTE — Telephone Encounter (Signed)
Received referral from Dr. Marval Regal requesting fistulagram d/t poorly maturing AVF - to scheduling  ?

## 2021-11-15 ENCOUNTER — Other Ambulatory Visit: Payer: Self-pay

## 2021-11-21 ENCOUNTER — Encounter (HOSPITAL_COMMUNITY)
Admission: RE | Disposition: A | Discharge status: Home / Self Care | Payer: Self-pay | Source: Home / Self Care | Attending: Vascular Surgery

## 2021-11-21 ENCOUNTER — Encounter (HOSPITAL_COMMUNITY): Payer: Self-pay | Admitting: Vascular Surgery

## 2021-11-21 ENCOUNTER — Ambulatory Visit (HOSPITAL_COMMUNITY)
Admission: RE | Admit: 2021-11-21 | Discharge: 2021-11-21 | Disposition: A | Discharge status: Home / Self Care | Payer: Medicare Other | Attending: Vascular Surgery | Admitting: Vascular Surgery

## 2021-11-21 ENCOUNTER — Other Ambulatory Visit: Payer: Self-pay

## 2021-11-21 DIAGNOSIS — E1122 Type 2 diabetes mellitus with diabetic chronic kidney disease: Secondary | ICD-10-CM | POA: Diagnosis not present

## 2021-11-21 DIAGNOSIS — Z992 Dependence on renal dialysis: Secondary | ICD-10-CM | POA: Diagnosis not present

## 2021-11-21 DIAGNOSIS — T82898A Other specified complication of vascular prosthetic devices, implants and grafts, initial encounter: Secondary | ICD-10-CM | POA: Diagnosis not present

## 2021-11-21 DIAGNOSIS — N186 End stage renal disease: Secondary | ICD-10-CM | POA: Diagnosis not present

## 2021-11-21 DIAGNOSIS — I132 Hypertensive heart and chronic kidney disease with heart failure and with stage 5 chronic kidney disease, or end stage renal disease: Secondary | ICD-10-CM | POA: Insufficient documentation

## 2021-11-21 DIAGNOSIS — I509 Heart failure, unspecified: Secondary | ICD-10-CM | POA: Diagnosis not present

## 2021-11-21 DIAGNOSIS — Z7984 Long term (current) use of oral hypoglycemic drugs: Secondary | ICD-10-CM | POA: Diagnosis not present

## 2021-11-21 DIAGNOSIS — Y841 Kidney dialysis as the cause of abnormal reaction of the patient, or of later complication, without mention of misadventure at the time of the procedure: Secondary | ICD-10-CM | POA: Insufficient documentation

## 2021-11-21 HISTORY — PX: A/V FISTULAGRAM: CATH118298

## 2021-11-21 LAB — GLUCOSE, CAPILLARY: Glucose-Capillary: 150 mg/dL — ABNORMAL HIGH (ref 70–99)

## 2021-11-21 LAB — POCT I-STAT, CHEM 8
BUN: 66 mg/dL — ABNORMAL HIGH (ref 6–20)
Calcium, Ion: 1.04 mmol/L — ABNORMAL LOW (ref 1.15–1.40)
Chloride: 97 mmol/L — ABNORMAL LOW (ref 98–111)
Creatinine, Ser: 7.4 mg/dL — ABNORMAL HIGH (ref 0.61–1.24)
Glucose, Bld: 191 mg/dL — ABNORMAL HIGH (ref 70–99)
HCT: 33 % — ABNORMAL LOW (ref 39.0–52.0)
Hemoglobin: 11.2 g/dL — ABNORMAL LOW (ref 13.0–17.0)
Potassium: 4.6 mmol/L (ref 3.5–5.1)
Sodium: 137 mmol/L (ref 135–145)
TCO2: 28 mmol/L (ref 22–32)

## 2021-11-21 SURGERY — A/V FISTULAGRAM
Anesthesia: LOCAL | Laterality: Right

## 2021-11-21 MED ORDER — LIDOCAINE HCL (PF) 1 % IJ SOLN
INTRAMUSCULAR | Status: AC
Start: 2021-11-21 — End: ?
  Filled 2021-11-21: qty 30

## 2021-11-21 MED ORDER — SODIUM CHLORIDE 0.9 % IV SOLN: 250.0000 mL | INTRAVENOUS | Status: DC | PRN

## 2021-11-21 MED ORDER — HEPARIN SODIUM (PORCINE) 1000 UNIT/ML IJ SOLN
INTRAMUSCULAR | Status: AC
  Filled 2021-11-21: qty 10

## 2021-11-21 MED ORDER — HEPARIN (PORCINE) IN NACL 1000-0.9 UT/500ML-% IV SOLN
INTRAVENOUS | Status: DC | PRN
Start: 2021-11-21 — End: 2021-11-21
  Administered 2021-11-21: 500 mL

## 2021-11-21 MED ORDER — IODIXANOL 320 MG/ML IV SOLN
INTRAVENOUS | Status: DC | PRN
  Administered 2021-11-21: 55 mL

## 2021-11-21 MED ORDER — SODIUM CHLORIDE 0.9% FLUSH: 3.0000 mL | INTRAVENOUS | Status: DC | PRN

## 2021-11-21 MED ORDER — LIDOCAINE HCL (PF) 1 % IJ SOLN
INTRAMUSCULAR | Status: DC | PRN
  Administered 2021-11-21: 2 mL

## 2021-11-21 MED ORDER — SODIUM CHLORIDE 0.9% FLUSH: 3.0000 mL | Freq: Two times a day (BID) | INTRAVENOUS | Status: DC

## 2021-11-21 MED ORDER — HEPARIN (PORCINE) IN NACL 1000-0.9 UT/500ML-% IV SOLN
INTRAVENOUS | Status: AC
  Filled 2021-11-21: qty 500

## 2021-11-21 MED ORDER — VERAPAMIL HCL 2.5 MG/ML IV SOLN
INTRAVENOUS | Status: AC
  Filled 2021-11-21: qty 2

## 2021-11-21 SURGICAL SUPPLY — 10 items
BAG SNAP BAND KOVER 36X36 (MISCELLANEOUS) ×2 IMPLANT
COVER DOME SNAP 22 D (MISCELLANEOUS) ×2 IMPLANT
KIT MICROPUNCTURE NIT STIFF (SHEATH) ×1 IMPLANT
PROTECTION STATION PRESSURIZED (MISCELLANEOUS) ×2
SHEATH PROBE COVER 6X72 (BAG) ×2 IMPLANT
STATION PROTECTION PRESSURIZED (MISCELLANEOUS) ×1 IMPLANT
STOPCOCK MORSE 400PSI 3WAY (MISCELLANEOUS) ×2 IMPLANT
TRAY PV CATH (CUSTOM PROCEDURE TRAY) ×2 IMPLANT
TUBING CIL FLEX 10 FLL-RA (TUBING) ×3 IMPLANT
WIRE TORQFLEX AUST .018X40CM (WIRE) ×1 IMPLANT

## 2021-11-21 NOTE — H&P (Signed)
?H+P ? ? ? ? ?History of Present Illness: This is a 56 y.o. male esrd on hd via tdc. He has a right radiocephalic fistula which has failed to mature.  He is now here for fistulogram for further evaluation. ? ?Past Medical History:  ?Diagnosis Date  ? CHF (congestive heart failure) ([place])   ? Diabetes mellitus   ? Hypertension   ? ? ?Past Surgical History:  ?Procedure Laterality Date  ? AV FISTULA PLACEMENT Right 07/22/2021  ? Procedure: RIGHT  ARM FISTULA;  Surgeon: Serafina Mitchell, MD;  Location: [facility] OR;  Service: Vascular;  Laterality: Right;  ? INCISION AND DRAINAGE    ? INSERTION OF DIALYSIS CATHETER  07/22/2021  ? Procedure: INSERTION OF DIALYSIS CATHETER;  Surgeon: Serafina Mitchell, MD;  Location: [place];  Service: Vascular;;  ? KNEE SURGERY    ? ? ?No Known Allergies ? ?Prior to Admission medications   ?Medication Sig Start Date End Date Taking? Authorizing Provider  ?acetaminophen (TYLENOL) 325 MG tablet Take 2 tablets (650 mg total) by mouth every 6 (six) hours as needed for mild pain (or Fever >/= 101). 10/01/20  Yes Emokpae, Courage, MD  ?calcium acetate (PHOSLO) 667 MG capsule Take 1 capsule (667 mg total) by mouth 3 (three) times daily with meals. 08/02/21  Yes Patrecia Pour, MD  ?GARLIC PO Take 1 capsule by mouth daily.   Yes [provider]  ?Multiple Vitamin (MULTIVITAMIN WITH MINERALS) TABS tablet Take 1 tablet by mouth daily.   Yes [provider]  ?potassium chloride (KLOR-CON) 10 MEQ tablet Take 10 mEq by mouth daily as needed (when feeling good).   Yes [provider]  ?Vitamin D, Ergocalciferol, (DRISDOL) 1.25 MG (50000 UNIT) CAPS capsule Take 50,000 Units by mouth every Friday.   Yes [provider]  ?Zinc 50 MG CAPS Take 50 mg by mouth every morning.   Yes [provider]  ?furosemide (LASIX) 80 MG tablet Take 1 tablet (80 mg total) by mouth 2 (two) times daily. ?Patient not taking: Reported on 11/17/2021 08/02/21   Patrecia Pour, MD  ?glimepiride  (AMARYL) 4 MG tablet Take 0.5 tablets (2 mg total) by mouth 2 (two) times daily. ?Patient not taking: Reported on 11/17/2021 04/23/21   Barton Dubois, MD  ?hydrALAZINE (APRESOLINE) 100 MG tablet Take 1 tablet (100 mg total) by mouth 3 (three) times daily. ?Patient not taking: Reported on 11/17/2021 08/02/21   Patrecia Pour, MD  ?labetalol (NORMODYNE) 300 MG tablet Take 2 tablets (600 mg total) by mouth 2 (two) times daily. ?Patient not taking: Reported on 11/17/2021 06/24/21   Larey Dresser, MD  ? ? ?Social History  ? ?Socioeconomic History  ? Marital status: Married  ?  Spouse name: Not on file  ? Number of children: Not on file  ? Years of education: Not on file  ? Highest education level: Not on file  ?Occupational History  ? Not on file  ?Tobacco Use  ? Smoking status: Never  ? Smokeless tobacco: Never  ?Vaping Use  ? Vaping Use: Never used  ?Substance and Sexual Activity  ? Alcohol use: No  ? Drug use: No  ? Sexual activity: Not on file  ?Other Topics Concern  ? Not on file  ?Social History Narrative  ? Not on file  ? ?Social Determinants of Health  ? ?Financial Resource Strain: High Risk  ? Difficulty of Paying Living Expenses: Hard  ?Food Insecurity: Food Insecurity Present  ? Worried  About Running Out of Food in the Last Year: Sometimes true  ? Ran Out of Food in the Last Year: Never true  ?Transportation Needs: No Transportation Needs  ? Lack of Transportation (Medical): No  ? Lack of Transportation (Non-Medical): No  ?Physical Activity: Not on file  ?Stress: Not on file  ?Social Connections: Not on file  ?Intimate Partner Violence: Not on file  ? ? ?Family History  ?Problem Relation Age of Onset  ? Dementia Mother   ? Diabetes Father   ? ? ?ROS: no complaints today ? ? ?Physical Examination ? ?Vitals:  ? 11/21/21 1025  ?BP: (!) 189/86  ?Pulse: 86  ?Resp: 18  ?Temp: 98 ?F (36.7 ?C)  ?SpO2: 99%  ? ?Body mass index is 39.89 kg/m?. ? ?Aaox3 ?Right arm cephalic vein avf with palpable thrill and large  branches ? ?CBC ?   ?Component Value Date/Time  ? WBC 6.1 08/01/2021 1151  ? RBC 3.86 (L) 08/01/2021 1151  ? HGB 10.5 (L) 08/01/2021 1151  ? HCT 32.0 (L) 08/01/2021 1151  ? PLT 320 08/01/2021 1151  ? MCV 82.9 08/01/2021 1151  ? MCH 27.2 08/01/2021 1151  ? MCHC 32.8 08/01/2021 1151  ? RDW 14.6 08/01/2021 1151  ? LYMPHSABS 1.0 04/16/2021 0847  ? MONOABS 0.6 04/16/2021 0847  ? EOSABS 0.2 04/16/2021 0847  ? BASOSABS 0.0 04/16/2021 0847  ? ? ?BMET ?   ?Component Value Date/Time  ? NA 132 (L) 08/02/2021 0059  ? NA 145 (H) 04/28/2020 1435  ? K 4.2 08/02/2021 0059  ? CL 97 (L) 08/02/2021 0059  ? CO2 25 08/02/2021 0059  ? GLUCOSE 143 (H) 08/02/2021 0059  ? BUN 49 (H) 08/02/2021 0059  ? BUN 34 (H) 04/28/2020 1435  ? CREATININE 4.90 (H) 08/02/2021 0059  ? CALCIUM 8.5 (L) 08/02/2021 0059  ? CALCIUM 8.2 (L) 06/08/2021 1117  ? GFRNONAA 13 (L) 08/02/2021 0059  ? GFRAA 57 (L) 04/28/2020 1435  ? ? ?COAGS: ?Lab Results  ?Component Value Date  ? INR 1.0 05/03/2020  ? ? ? ?Non-Invasive Vascular Imaging:   ?No studies ? ?ASSESSMENT/PLAN: This is a 56 y.o. male with esrd with avf that has failed to mature. Plan fistulogram today.  ? ?Kaycen Whitworth C. Donzetta Matters, MD ?[facility] ?Office: [phone] ?Pager: [phone] ? ?

## 2021-11-21 NOTE — Op Note (Signed)
? ? ?  Patient name: [name] MRN: [id number] DOB: April 02, 1966 Sex: male ? ?11/21/2021 ?Pre-operative Diagnosis: End-stage renal disease, malfunction right arm AV fistula ?Post-operative diagnosis:  Same ?Surgeon:  Eda Paschal. Donzetta Matters, MD ?Procedure Performed: ?1.  Ultrasound-guided cannulation right arm cephalic vein AV fistula ?2.  Right upper extremity fistulogram ? ? ?Indications: 56 year old male with end-stage renal disease currently dialyzing via right IJ catheter.  He has a previous radial artery to cephalic vein AV fistula which they have not yet used.  He is indicated for fistulogram with possible intervention. ? ?Findings: The fistula was cannulated just after the anastomosis.  By ultrasound the anastomosis was widely patent.  The right upper extremity fistula does not appear to have any large branches and there is no areas of stenosis.  Gives rise to both the basilic and cephalic vein in the upper arm and centrally the veins are patent. ? ?Fistula is okay for cannulation.  Because of fistula is unable to be cannulated we can consider superficialization versus conversion to upper arm AV fistula. ?  ?Procedure:  The patient was identified in the holding area and taken to room 8.  The patient was then placed supine on the table and prepped and draped in the usual sterile fashion.  A time out was called.  Ultrasound was used to evaluate the right arm AV fistula.  The area was anesthetized with 1% lidocaine cannulated with direct ultrasound visualization with micropuncture needle followed by wire and sheath.  And images saved in permanent record.  We performed right upper extremity fistulogram and with the above findings no intervention was undertaken.  The wire and sheath were removed and the area was suture-ligated.  He tolerated the procedure well without any complication. ? ?Contrast: 55cc ? ? ?Axcel Horsch C. Donzetta Matters, MD ?[facility] ?Office: [phone] ?Pager: [phone] ? ? ?

## 2021-11-23 ENCOUNTER — Telehealth: Payer: Self-pay

## 2021-11-23 NOTE — Telephone Encounter (Signed)
I have spoken to [place] at pt's HD center to let her know that pt's fistula is okay to access, per MD staff message. Lattie Haw verbalized understanding; no questions/concerns at this time.  ?

## 2021-11-25 NOTE — Progress Notes (Incomplete)
PCP: [name], MD ?Cardiology: Dr. Harl Bowie ?HF Cardiology: Dr. Aundra Dubin ? ?56 y.o. with history of HTN, type 2 diabetes, CKD stage 3, and chronic diastolic CHF was referred by Dr. Harl Bowie for evaluation of diastolic CHF.  For > 1 year now, patient has had significant exertional dyspnea.  He has been admitted several times in the last year, most recently in 3/22.  Echo in 9/21 showed EF 50%, moderate LVH, grade 2 diastolic dysfunction, normal RV size and systolic function.  Creatinine elevated, he is followed by nephrology.  Echo in 9/22 with EF 55-60%, moderate LVH.  ? ?Patient was admitted in 9/22 with CHF exacerbation.  ? ?Volume overloaded at post hospital follow up 06/24/21. Metolazone increased to 5 mg 5 days a week. Carvedilol stopped as he had been taking this with labetalol. Lisinopril subsequently stopped when labs showed SCr 5.6. ? ?Admitted 07/15/21 from clinic with CHF exacerbation. Dialysis initiated this admission, s/p AVF creation. PYP scan suggests TTR cardiac amyloidosis. MM panel negative. Lasix 80 bid continued at discharge per renal. Down 56 lbs. ? ?Today he returns for post hospital HF follow up with his wife. Overall feeling fine, fatigued walking further distances on flat ground. Occasional dizziness when walking, no syncope. Denies palpitations, CP, edema, or PND/Orthopnea. Appetite ok. No fever or chills. Taking all medications. He is urinating every other day. Seems to be tolerating HD well, no reports of low BP on dialysis. ? ?S/p Right AV fistulagram 5/23. ? ?Labs (5/22): K 4.5, creatinine 2.65, myeloma panel negative ?Labs (6/22): K 4.7, creatinine 4.4, BUN 77, urine immunofixation negative ?Labs (11/22): K 4.9, creatinine 3.74 ?Labs (12/22): K 5.6, creatinine 4.18 ?Labs (1/23): K 4.2, creatinine 4.99 ? ?ECG (personally reviewed): SR w/ PVC 88 bpm ? ?PMH: ?1. HTN ?2. Type 2 diabetes ?3. ESRD: Suspect diabetic nephropathy.   ?4. Chronic diastolic CHF: Echo in 3/97 with EF 50%, moderate  LVH, grade 2 diastolic dysfunction, normal RV size and systolic function.  ?- Echo (9/22): EF 55-60%, moderate LVH, RV normal ?- PYP (12/22): Visual and quantitative assessment (grade 2, H/CLL equal 1.18) are strongly suggestive of transthyretin amyloidosis. ? ?Social History  ? ?Socioeconomic History  ? Marital status: Married  ?  Spouse name: Not on file  ? Number of children: Not on file  ? Years of education: Not on file  ? Highest education level: Not on file  ?Occupational History  ? Not on file  ?Tobacco Use  ? Smoking status: Never  ? Smokeless tobacco: Never  ?Vaping Use  ? Vaping Use: Never used  ?Substance and Sexual Activity  ? Alcohol use: No  ? Drug use: No  ? Sexual activity: Not on file  ?Other Topics Concern  ? Not on file  ?Social History Narrative  ? Not on file  ? ?Social Determinants of Health  ? ?Financial Resource Strain: High Risk  ? Difficulty of Paying Living Expenses: Hard  ?Food Insecurity: Food Insecurity Present  ? Worried About Charity fundraiser in the Last Year: Sometimes true  ? Ran Out of Food in the Last Year: Never true  ?Transportation Needs: No Transportation Needs  ? Lack of Transportation (Medical): No  ? Lack of Transportation (Non-Medical): No  ?Physical Activity: Not on file  ?Stress: Not on file  ?Social Connections: Not on file  ?Intimate Partner Violence: Not on file  ? ?Family History  ?Problem Relation Age of Onset  ? Dementia Mother   ? Diabetes Father   ? ?ROS:  All systems reviewed and negative except as per HPI.  ? ?Current Outpatient Medications  ?Medication Sig Dispense Refill  ? acetaminophen (TYLENOL) 325 MG tablet Take 2 tablets (650 mg total) by mouth every 6 (six) hours as needed for mild pain (or Fever >/= 101). 12 tablet 1  ? calcium acetate (PHOSLO) 667 MG capsule Take 1 capsule (667 mg total) by mouth 3 (three) times daily with meals. 90 capsule 0  ? furosemide (LASIX) 80 MG tablet Take 1 tablet (80 mg total) by mouth 2 (two) times daily. (Patient not  taking: Reported on 11/17/2021) 60 tablet 0  ? GARLIC PO Take 1 capsule by mouth daily.    ? glimepiride (AMARYL) 4 MG tablet Take 0.5 tablets (2 mg total) by mouth 2 (two) times daily. (Patient not taking: Reported on 11/17/2021)    ? hydrALAZINE (APRESOLINE) 100 MG tablet Take 1 tablet (100 mg total) by mouth 3 (three) times daily. (Patient not taking: Reported on 11/17/2021) 90 tablet 0  ? labetalol (NORMODYNE) 300 MG tablet Take 2 tablets (600 mg total) by mouth 2 (two) times daily. (Patient not taking: Reported on 11/17/2021) 60 tablet 6  ? Multiple Vitamin (MULTIVITAMIN WITH MINERALS) TABS tablet Take 1 tablet by mouth daily.    ? potassium chloride (KLOR-CON) 10 MEQ tablet Take 10 mEq by mouth daily as needed (when feeling good).    ? Vitamin D, Ergocalciferol, (DRISDOL) 1.25 MG (50000 UNIT) CAPS capsule Take 50,000 Units by mouth every Friday.    ? Zinc 50 MG CAPS Take 50 mg by mouth every morning.    ? ?No current facility-administered medications for this visit.  ? ?There were no vitals taken for this visit. ? ?Wt Readings from Last 3 Encounters:  ?11/21/21 129.7 kg (286 lb)  ?08/05/21 116.6 kg (257 lb)  ?08/02/21 114.6 kg (252 lb 11.2 oz)  ? ?General:  NAD. No resp difficulty, walked into clinic ?HEENT: Normal ?Neck: Supple. No JVD. Carotids 2+ bilat; no bruits. No lymphadenopathy or thryomegaly appreciated. ?Cor: PMI nondisplaced. Regular rate & rhythm. No rubs, gallops or murmurs. ?Lungs: Clear, TDC looks good. ?Abdomen: Obese, nontender, nondistended. No hepatosplenomegaly. No bruits or masses. Good bowel sounds. ?Extremities: No cyanosis, clubbing, rash, edema; R arm AVF +/+ ?Neuro: Alert & oriented x 3, cranial nerves grossly intact. Moves all 4 extremities w/o difficulty. Affect pleasant. ? ?Assessment/Plan: ?1. Chronic diastolic CHF: Most recent echo in 9/22 showed EF 55-60%, moderate LVH, grade 2 diastolic dysfunction, normal RV size and systolic function.  Multiple admissions for CHF.  Most recent  12/22, dialysis initiated. 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 Multiple panel negative. Today, NYHA III, volume now managed by dialysis but he is not overloaded today. ?- Continue Lasix 80 mg bid (per nephrology) ?- No ARB/ARNi/spiro/SGLT2i with ESRD. ?- Will arrange genetic testing today and engaged HF PharmD to start process for initiation of Tafamadis. ?- Will order serum/urine protein immunofixation. ?2. ESRD: New as of 12/22 admission. S/p AVF creation and TDC placement. ?- He followed with Dr. Theador Hawthorne outpatient. ?- Dialysis T/Th/Sat at [facility] in [place]. ?- Making urine every other day, on Lasix. ?3. OSA: Refuses CPAP. ?4. HTN: BP mildly elevated today. He has not been on dialysis very long, I am hesitant to add another agent and risk compromised volume removal due to low BP. ?- Monitor BP, If BP continues to be elevated, would add amlodipine 5. ?- Continue labetalol 600 mg bid. ?- Continue hydralazine 100 mg tid.  ?5. DM2: a1C 7.1  12/22. ?- On glimepride. ?- Per PCP. ? ?Follow up with Dr. Aundra Dubin in 4-6 weeks for Tafamadis. Pharmacy assisting with PA. Unclear if we will be able to initiate Vyndamax with HD, pharmacy helping with this. ? ?Allena Katz, FNP-BC ?11/25/21 ? ?

## 2021-11-28 ENCOUNTER — Telehealth (HOSPITAL_COMMUNITY): Payer: Self-pay

## 2021-11-28 ENCOUNTER — Encounter (HOSPITAL_COMMUNITY): Payer: 59

## 2021-11-28 NOTE — Telephone Encounter (Signed)
Called patient on 11/25/21 and left a detailed message to confirm/remind patient of their appointment at the [facility] on 11/28/21.  ? ?Patient reminded to bring all medications and/or complete list. ? ?Confirmed patient has transportation. Gave directions, instructed to utilize [place] parking. ? ?Confirmed appointment prior to ending call.  ? ?

## 2021-11-30 ENCOUNTER — Ambulatory Visit (INDEPENDENT_AMBULATORY_CARE_PROVIDER_SITE_OTHER): Payer: 59 | Admitting: Cardiology

## 2021-11-30 ENCOUNTER — Encounter: Payer: Self-pay | Admitting: Cardiology

## 2021-11-30 VITALS — BP 160/88 | HR 88 | Ht 71.0 in | Wt 280.6 lb

## 2021-11-30 DIAGNOSIS — I5032 Chronic diastolic (congestive) heart failure: Secondary | ICD-10-CM

## 2021-11-30 DIAGNOSIS — Z992 Dependence on renal dialysis: Secondary | ICD-10-CM

## 2021-11-30 DIAGNOSIS — N186 End stage renal disease: Secondary | ICD-10-CM

## 2021-11-30 MED ORDER — PRAVASTATIN SODIUM 40 MG PO TABS: 40.0000 mg | ORAL_TABLET | Freq: Every evening | ORAL | 3 refills | Status: DC

## 2021-11-30 NOTE — Addendum Note (Signed)
Addended by: Berlinda Last on: 11/30/2021 08:48 AM ? ? Modules accepted: Orders ? ?

## 2021-11-30 NOTE — Progress Notes (Signed)
? ? ? ?Clinical Summary ?[name] is a 56 y.o.maleseen today for follow up of the following medical problems.  ?  ?1.Chronic diastolic HF ?- 07/6107 Echocardiogram shows a low-normal EF of 50% with no regional WMA. Noted to have severe LVH and Grade 2 DD. ?  ?-followed in HF clinic ?- myeloma panel negative, has not been able to get pyp scan due to insurance or a sleep study ?- he is on torsemide '80mg'$  bid, metolazone 2.'5mg'$  once weekly though recently metolazone has been on hold ?- last Cr 4.2, uptrend from 2.7 2 months ago. ?- notes mention weight had been up to 290 lbs, down to 276 today. Home weight down to 272 lbs.  ?- some ongoing swelling in legs, though much imprved. SOB improving. No orthopnea.  ? ?- now on HD for fluid management. Still makes some urine.  ?- no recent edema, no SOB/DOE.  ? ?  ?  ?2. HTN ? ?- reports low bp's during HD at times.  ?- reports kidney doctor had stopped labetalol. He is unsure about hydralazine but has also stopped taking.  ?  ?  ?  ?3. ESRD ?- followed by nephrology Dr Theador Hawthorne ?- from last note planning for vacsular consult to consider access ?  ?  ?  ? ? ?Past Medical History:  ?Diagnosis Date  ? CHF (congestive heart failure) ([place])   ? Diabetes mellitus   ? Hypertension   ? ? ? ?No Known Allergies ? ? ?Current Outpatient Medications  ?Medication Sig Dispense Refill  ? acetaminophen (TYLENOL) 325 MG tablet Take 2 tablets (650 mg total) by mouth every 6 (six) hours as needed for mild pain (or Fever >/= 101). 12 tablet 1  ? calcium acetate (PHOSLO) 667 MG capsule Take 1 capsule (667 mg total) by mouth 3 (three) times daily with meals. 90 capsule 0  ? furosemide (LASIX) 80 MG tablet Take 1 tablet (80 mg total) by mouth 2 (two) times daily. (Patient not taking: Reported on 11/17/2021) 60 tablet 0  ? GARLIC PO Take 1 capsule by mouth daily.    ? glimepiride (AMARYL) 4 MG tablet Take 0.5 tablets (2 mg total) by mouth 2 (two) times daily. (Patient not taking: Reported on 11/17/2021)     ? hydrALAZINE (APRESOLINE) 100 MG tablet Take 1 tablet (100 mg total) by mouth 3 (three) times daily. (Patient not taking: Reported on 11/17/2021) 90 tablet 0  ? labetalol (NORMODYNE) 300 MG tablet Take 2 tablets (600 mg total) by mouth 2 (two) times daily. (Patient not taking: Reported on 11/17/2021) 60 tablet 6  ? Multiple Vitamin (MULTIVITAMIN WITH MINERALS) TABS tablet Take 1 tablet by mouth daily.    ? potassium chloride (KLOR-CON) 10 MEQ tablet Take 10 mEq by mouth daily as needed (when feeling good).    ? Vitamin D, Ergocalciferol, (DRISDOL) 1.25 MG (50000 UNIT) CAPS capsule Take 50,000 Units by mouth every Friday.    ? Zinc 50 MG CAPS Take 50 mg by mouth every morning.    ? ?No current facility-administered medications for this visit.  ? ? ? ?Past Surgical History:  ?Procedure Laterality Date  ? A/V FISTULAGRAM Right 11/21/2021  ? Procedure: A/V Fistulagram;  Surgeon: Waynetta Sandy, MD;  Location: [facility];  Service: Cardiovascular;  Laterality: Right;  ? AV FISTULA PLACEMENT Right 07/22/2021  ? Procedure: RIGHT  ARM FISTULA;  Surgeon: Serafina Mitchell, MD;  [facility];  Service: Vascular;  Laterality: Right;  ? INCISION  AND DRAINAGE    ? INSERTION OF DIALYSIS CATHETER  07/22/2021  ? Procedure: INSERTION OF DIALYSIS CATHETER;  Surgeon: Serafina Mitchell, MD;  Location: [place];  Service: Vascular;;  ? KNEE SURGERY    ? ? ? ?No Known Allergies ? ? ? ?Family History  ?Problem Relation Age of Onset  ? Dementia Mother   ? Diabetes Father   ? ? ? ?Social History ?[name] reports that he has never smoked. He has never used smokeless tobacco. ?[name] reports no history of alcohol use. ? ? ?Review of Systems ?CONSTITUTIONAL: No weight loss, fever, chills, weakness or fatigue.  ?HEENT: Eyes: No visual loss, blurred vision, double vision or yellow sclerae.No hearing loss, sneezing, congestion, runny nose or sore throat.  ?SKIN: No rash or itching.  ?CARDIOVASCULAR: per hpi ?RESPIRATORY: No  shortness of breath, cough or sputum.  ?GASTROINTESTINAL: No anorexia, nausea, vomiting or diarrhea. No abdominal pain or blood.  ?GENITOURINARY: No burning on urination, no polyuria ?NEUROLOGICAL: No headache, dizziness, syncope, paralysis, ataxia, numbness or tingling in the extremities. No change in bowel or bladder control.  ?MUSCULOSKELETAL: No muscle, back pain, joint pain or stiffness.  ?LYMPHATICS: No enlarged nodes. No history of splenectomy.  ?PSYCHIATRIC: No history of depression or anxiety.  ?ENDOCRINOLOGIC: No reports of sweating, cold or heat intolerance. No polyuria or polydipsia.  ?. ? ? ?Physical Examination ?Today's Vitals  ? 11/30/21 0825  ?BP: (!) 160/88  ?Pulse: 88  ?SpO2: 98%  ?Weight: 280 lb 9.6 oz (127.3 kg)  ?Height: '5\' 11"'$  (1.803 m)  ? ?Body mass index is 39.14 kg/m?. ? ?Gen: resting comfortably, no acute distress ?HEENT: no scleral icterus, pupils equal round and reactive, no palptable cervical adenopathy,  ?CV: RRR, no m/r/g no jvd ?Resp: Clear to auscultation bilaterally ?GI: abdomen is soft, non-tender, non-distended, normal bowel sounds, no hepatosplenomegaly ?MSK: extremities are warm, no edema.  ?Skin: warm, no rash ?Neuro:  no focal deficits ?Psych: appropriate affect ? ? ?Diagnostic Studies ? ? ? ? ?Assessment and Plan  ? ?Chronic diastolic HF ?- fluid management per dialysis ?- no changes today, has f/u with HF clinic in June ?  ?2. HTN ?- unclear history, appears some issues with low bp's since starting HD and he reports neprhology had stopped his labetalol. He is not sure if he was told to stop his hydrlalazine as well but stopped it on his own due to dizziness.  ?- defer bp management to neprhology who know more about recent bp issues during HD. Could consider a regimen on no HD days perhaps in the future.  ?  ?  ?  ?  ?  ?F/u 6 months ? ? ? ? ?[name], M.D. ? ?

## 2021-11-30 NOTE — Patient Instructions (Signed)
Medication Instructions:  ?Start Pravastatin 40 mg tablets daily ? ?Labwork: ?None ? ?Testing/Procedures: ?None ? ?Follow-Up: ?Follow up with Dr. Harl Bowie in 6 months.  ? ?Any Other Special Instructions Will Be Listed Below (If Applicable). ? ? ? ? ?If you need a refill on your cardiac medications before your next appointment, please call your pharmacy. ? ?

## 2022-01-18 ENCOUNTER — Other Ambulatory Visit (HOSPITAL_COMMUNITY): Payer: Self-pay | Admitting: Cardiology

## 2022-01-18 ENCOUNTER — Encounter (HOSPITAL_COMMUNITY): Payer: 59 | Admitting: Cardiology

## 2022-01-23 ENCOUNTER — Ambulatory Visit (HOSPITAL_COMMUNITY)
Admission: RE | Admit: 2022-01-23 | Discharge: 2022-01-23 | Disposition: A | Discharge status: Home / Self Care | Payer: Medicare Other | Source: Ambulatory Visit | Attending: Cardiology | Admitting: Cardiology

## 2022-01-23 ENCOUNTER — Encounter (HOSPITAL_COMMUNITY): Payer: Self-pay | Admitting: Cardiology

## 2022-01-23 VITALS — BP 140/80 | HR 90 | Wt 288.0 lb

## 2022-01-23 DIAGNOSIS — E859 Amyloidosis, unspecified: Secondary | ICD-10-CM | POA: Diagnosis not present

## 2022-01-23 DIAGNOSIS — I5022 Chronic systolic (congestive) heart failure: Secondary | ICD-10-CM | POA: Diagnosis not present

## 2022-01-23 DIAGNOSIS — N186 End stage renal disease: Secondary | ICD-10-CM | POA: Diagnosis not present

## 2022-01-23 DIAGNOSIS — E1122 Type 2 diabetes mellitus with diabetic chronic kidney disease: Secondary | ICD-10-CM | POA: Insufficient documentation

## 2022-01-23 DIAGNOSIS — Z79899 Other long term (current) drug therapy: Secondary | ICD-10-CM | POA: Insufficient documentation

## 2022-01-23 DIAGNOSIS — E785 Hyperlipidemia, unspecified: Secondary | ICD-10-CM | POA: Insufficient documentation

## 2022-01-23 DIAGNOSIS — I132 Hypertensive heart and chronic kidney disease with heart failure and with stage 5 chronic kidney disease, or end stage renal disease: Secondary | ICD-10-CM | POA: Diagnosis present

## 2022-01-23 DIAGNOSIS — G4733 Obstructive sleep apnea (adult) (pediatric): Secondary | ICD-10-CM | POA: Diagnosis not present

## 2022-01-23 DIAGNOSIS — N183 Chronic kidney disease, stage 3 unspecified: Secondary | ICD-10-CM | POA: Insufficient documentation

## 2022-01-23 DIAGNOSIS — I5032 Chronic diastolic (congestive) heart failure: Secondary | ICD-10-CM | POA: Diagnosis present

## 2022-01-23 DIAGNOSIS — Z992 Dependence on renal dialysis: Secondary | ICD-10-CM | POA: Insufficient documentation

## 2022-01-23 MED ORDER — PRAVASTATIN SODIUM 40 MG PO TABS: 40.0000 mg | ORAL_TABLET | Freq: Every evening | ORAL | 3 refills | Status: DC

## 2022-01-23 NOTE — Progress Notes (Signed)
PCP: [name], MD Cardiology: Dr. Harl Bowie HF Cardiology: Dr. Aundra Dubin  56 y.o. with history of HTN, type 2 diabetes, CKD stage 3, and chronic diastolic CHF was referred by Dr. Harl Bowie for evaluation of diastolic CHF.  For > 1 year now, patient has had significant exertional dyspnea.  He has been admitted several times in the last year, most recently in 3/22.  Echo in 9/21 showed EF 50%, moderate LVH, grade 2 diastolic dysfunction, normal RV size and systolic function.  Creatinine elevated, he is followed by nephrology.  Echo in 9/22 with EF 55-60%, moderate LVH.   Patient was admitted in 9/22 with CHF exacerbation.   Volume overloaded at post hospital follow up 06/24/21. Metolazone increased to 5 mg 5 days a week. Carvedilol stopped as he had been taking this with labetalol. Lisinopril subsequently stopped when labs showed SCr 5.6.  Admitted 07/15/21 from clinic with CHF exacerbation. Dialysis initiated this admission, s/p AVF creation. PYP scan equivocal for TTR cardiac amyloidosis, gene testing negative. myeloma panel negative.   He returns for followup of diastolic CHF.  He is now off all cardiac meds in setting of dialysis.  He is breathing better on HD, he is back to walking for exercise.  Can go 1 time around a track then has to rest.  No chest pain.  No orthopnea/PND.    Labs (5/22): K 4.5, creatinine 2.65, myeloma panel negative Labs (6/22): K 4.7, creatinine 4.4, BUN 77, urine immunofixation negative Labs (11/22): K 4.9, creatinine 3.74 Labs (12/22): K 5.6, creatinine 4.18  ECG (personally reviewed): NSR, normal  PMH; 1. HTN 2. Type 2 diabetes 3. ESRD: Suspect diabetic nephropathy.   4. Chronic diastolic CHF: Echo in 6/64 with EF 50%, moderate LVH, grade 2 diastolic dysfunction, normal RV size and systolic function.  - Echo (9/22): EF 55-60%, moderate LVH, RV normal - PYP (12/22): Visual and quantitative assessment (grade 2, H/CLL equal 1.18) are equivocal for transthyretin  amyloidosis. Genetic testing for TTR cardiac amyloidosis mutations was negative.   Social History   Socioeconomic History   Marital status: Married    Spouse name: Not on file   Number of children: Not on file   Years of education: Not on file   Highest education level: Not on file  Occupational History   Not on file  Tobacco Use   Smoking status: Never   Smokeless tobacco: Never  Vaping Use   Vaping Use: Never used  Substance and Sexual Activity   Alcohol use: No   Drug use: No   Sexual activity: Not on file  Other Topics Concern   Not on file  Social History Narrative   Not on file   Social Determinants of Health   Financial Resource Strain: High Risk (11/29/2020)   Overall Financial Resource Strain (CARDIA)    Difficulty of Paying Living Expenses: Hard  Food Insecurity: Food Insecurity Present (12/29/2020)   Hunger Vital Sign    Worried About Running Out of Food in the Last Year: Sometimes true    Ran Out of Food in the Last Year: Never true  Transportation Needs: No Transportation Needs (11/29/2020)   PRAPARE - Hydrologist (Medical): No    Lack of Transportation (Non-Medical): No  Physical Activity: Not on file  Stress: Not on file  Social Connections: Not on file  Intimate Partner Violence: Not on file   Family History  Problem Relation Age of Onset   Dementia Mother    Diabetes  Father    ROS: All systems reviewed and negative except as per HPI.   Current Outpatient Medications  Medication Sig Dispense Refill   acetaminophen (TYLENOL) 325 MG tablet Take 2 tablets (650 mg total) by mouth every 6 (six) hours as needed for mild pain (or Fever >/= 101). 12 tablet 1   calcium acetate (PHOSLO) 667 MG capsule Take 1 capsule (667 mg total) by mouth 3 (three) times daily with meals. 90 capsule 0   GARLIC PO Take 1 capsule by mouth daily.     Multiple Vitamin (MULTIVITAMIN WITH MINERALS) TABS tablet Take 1 tablet by mouth daily.     vitamin C  (ASCORBIC ACID) 500 MG tablet Take 500 mg by mouth daily.     Vitamin D, Ergocalciferol, (DRISDOL) 1.25 MG (50000 UNIT) CAPS capsule Take 50,000 Units by mouth every Friday.     Zinc 50 MG CAPS Take 50 mg by mouth every morning.     furosemide (LASIX) 80 MG tablet Take 1 tablet (80 mg total) by mouth 2 (two) times daily. (Patient not taking: Reported on 11/17/2021) 60 tablet 0   glimepiride (AMARYL) 4 MG tablet Take 0.5 tablets (2 mg total) by mouth 2 (two) times daily. (Patient not taking: Reported on 11/17/2021)     hydrALAZINE (APRESOLINE) 100 MG tablet Take 1 tablet (100 mg total) by mouth 3 (three) times daily. (Patient not taking: Reported on 11/17/2021) 90 tablet 0   labetalol (NORMODYNE) 300 MG tablet Take 2 tablets (600 mg total) by mouth 2 (two) times daily. (Patient not taking: Reported on 11/17/2021) 60 tablet 6   potassium chloride (KLOR-CON) 10 MEQ tablet Take 10 mEq by mouth daily as needed (when feeling good). (Patient not taking: Reported on 11/30/2021)     pravastatin (PRAVACHOL) 40 MG tablet Take 1 tablet (40 mg total) by mouth every evening. 30 tablet 3   No current facility-administered medications for this encounter.   BP 140/80   Pulse 90   Wt 130.6 kg (288 lb)   SpO2 98%   BMI 40.17 kg/m   Wt Readings from Last 3 Encounters:  01/23/22 130.6 kg (288 lb)  11/30/21 127.3 kg (280 lb 9.6 oz)  11/21/21 129.7 kg (286 lb)   General: NAD Neck: No JVD, no thyromegaly or thyroid nodule.  Lungs: Clear to auscultation bilaterally with normal respiratory effort. CV: Nondisplaced PMI.  Heart regular S1/S2, no S3/S4, no murmur.  1+ ankle edema.  No carotid bruit.  Normal pedal pulses.  Abdomen: Soft, nontender, no hepatosplenomegaly, no distention.  Skin: Intact without lesions or rashes.  Neurologic: Alert and oriented x 3.  Psych: Normal affect. Extremities: No clubbing or cyanosis.  HEENT: Normal.   Assessment/Plan: 1. Chronic diastolic CHF: Most recent echo in 9/22 showed  EF 55-60%, moderate LVH, grade 2 diastolic dysfunction, normal RV size and systolic function.  Multiple admissions for CHF.  Most recent in 12/22, dialysis initiated. PYP 12/22 equivocal for TTR amyloid, myeloma panel negative, gene testing for hATTR negative. Volume seems much better controlled on HD.  NYHA class II.  Of all BP meds now.  - With equivocal PYP scan in 12/22 and some clinical concern for TTR cardiac amyloidosis, will arrange for repeat PYP scan.  2. ESRD: New as of 12/22 admission. Doing well with dialysis, off BP meds and Lasix.  3. OSA: Refuses CPAP. 4. HTN: BP no longer elevated on HD and off BP-active meds.  5. Hyperlipidemia: Restart pravastatin 40 mg daily with lipids/LFTs in 2  months.   Followup 6 months with APP.   Loralie Champagne 01/23/2022

## 2022-01-23 NOTE — Patient Instructions (Signed)
No change in medications  PYP Scan has been ordered,we will call schedule any insurance approval obtained  Restart Pravastatin 40 mg daily  Lab work schedules for September 5,2023 @ 9 am    Your physician recommends that you schedule a follow-up appointment in: 6 months (Jan 2024 )Call in November to schedule an appointment  If you have any questions or concerns before your next appointment please send [place] a message through [place] or call our office at [phone].    TO LEAVE A MESSAGE FOR THE NURSE SELECT OPTION 2, PLEASE LEAVE A MESSAGE INCLUDING: YOUR NAME DATE OF BIRTH CALL BACK NUMBER REASON FOR CALL**this is important as we prioritize the call backs  YOU WILL RECEIVE A CALL BACK THE SAME DAY AS LONG AS YOU CALL BEFORE 4:00 PM At the [facility], you and your health needs are our priority. As part of our continuing mission to provide you with exceptional heart care, we have created designated Provider Care Teams. These Care Teams include your primary Cardiologist (physician) and Advanced Practice Providers (APPs- Physician Assistants and Nurse Practitioners) who all work together to provide you with the care you need, when you need it.   You may see any of the following providers on your designated Care Team at your next follow up: Dr Glori Bickers Dr Haynes Kerns, NP Lyda Jester, [place] [facility] [place], [place] Audry Riles, PharmD   Please be sure to bring in all your medications bottles to every appointment.

## 2022-02-07 ENCOUNTER — Encounter (HOSPITAL_COMMUNITY): Payer: Medicare Other

## 2022-02-10 ENCOUNTER — Encounter (HOSPITAL_COMMUNITY)
Admission: RE | Admit: 2022-02-10 | Discharge: 2022-02-10 | Disposition: A | Discharge status: Home / Self Care | Payer: Medicare Other | Source: Ambulatory Visit | Attending: Cardiology | Admitting: Cardiology

## 2022-02-10 DIAGNOSIS — E859 Amyloidosis, unspecified: Secondary | ICD-10-CM | POA: Diagnosis present

## 2022-02-10 MED ORDER — TECHNETIUM TC 99M PYROPHOSPHATE
21.2000 | Freq: Once | INTRAVENOUS | Status: AC | PRN
Start: 2022-02-10 — End: 2022-02-10
  Administered 2022-02-10: 21.2 via INTRAVENOUS
  Filled 2022-02-10: qty 22

## 2022-02-17 ENCOUNTER — Telehealth (HOSPITAL_COMMUNITY): Payer: Self-pay | Admitting: Surgery

## 2022-02-17 NOTE — Telephone Encounter (Signed)
I attempted to reach patient to review results.  I left a message for a return call.

## 2022-02-17 NOTE — Telephone Encounter (Signed)
-----   Message from Larey Dresser, MD sent at 02/13/2022  5:01 PM EDT ----- Not suggestive of cardiac amyloidosis.

## 2022-03-20 IMAGING — NM NM SCAN TUMOR LOCALIZE WITH SPECT
2 series · 12 of 12 positions shown · non-contrast
Comparison: none

CLINICAL DATA: HEART FAILURE. CONCERN FOR CARDIAC AMYLOIDOSIS.

EXAM:
NUCLEAR MEDICINE TUMOR LOCALIZATION. PYP CARDIAC AMYLOIDOSIS SCAN
WITH SPECT
TECHNIQUE: Following intravenous administration of radiopharmaceutical,
anterior planar images of the chest were obtained. Regions of
interest were placed on the heart and contralateral chest wall for
quantitative assessment. Additional SPECT imaging of the chest was
obtained.
RADIOPHARMACEUTICALS:  20.7 mCi TECHNETIUM 99 PYROPHOSPHATE

[Series 1: spect - (id)_(id)_cor · 4.1mm · 4.14mm/px · 6 of 128 frames shown]
[frame 11/128]
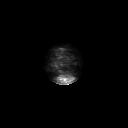
[frame 32/128]
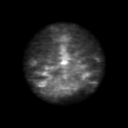
[frame 54/128]
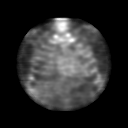
[frame 75/128]
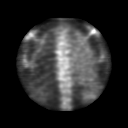
[frame 96/128]
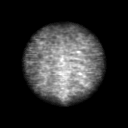
[frame 118/128]
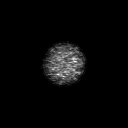

[Series 1: spect - (id)_(id)_tra · 4.1mm · 4.14mm/px · 6 of 128 frames shown]
[frame 11/128]
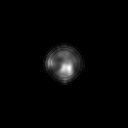
[frame 32/128]
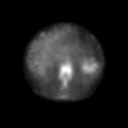
[frame 54/128]
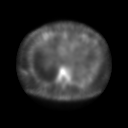
[frame 75/128]
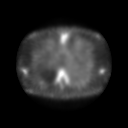
[frame 96/128]
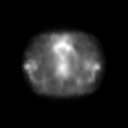
[frame 118/128]
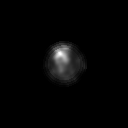

[12 of 12 positions shown; findings below may reference images not displayed]

FINDINGS: Planar Visual assessment:

Anterior planar imaging demonstrates radiotracer uptake within the
heart greater than uptake within the adjacent ribs (Grade 2).

Quantitative assessment :

Quantitative assessment of the cardiac uptake compared to the
contralateral chest wall is equal to (H/CL = 1.18).

SPECT assessment: SPECT imaging of the chest demonstrates mild
radiotracer accumulation within the LEFT ventricle.
IMPRESSION: Visual and quantitative assessment (grade 2, H/CLL equal 1.18) are
strongly suggestive of transthyretin amyloidosis.

## 2022-03-22 IMAGING — DX DG CHEST 1V PORT
1 series · 1 of 1 positions shown · non-contrast
Comparison: 07/15/2021

CLINICAL DATA: Shortness of breath, CHF

EXAM:
PORTABLE CHEST 1 VIEW

[chest ap]
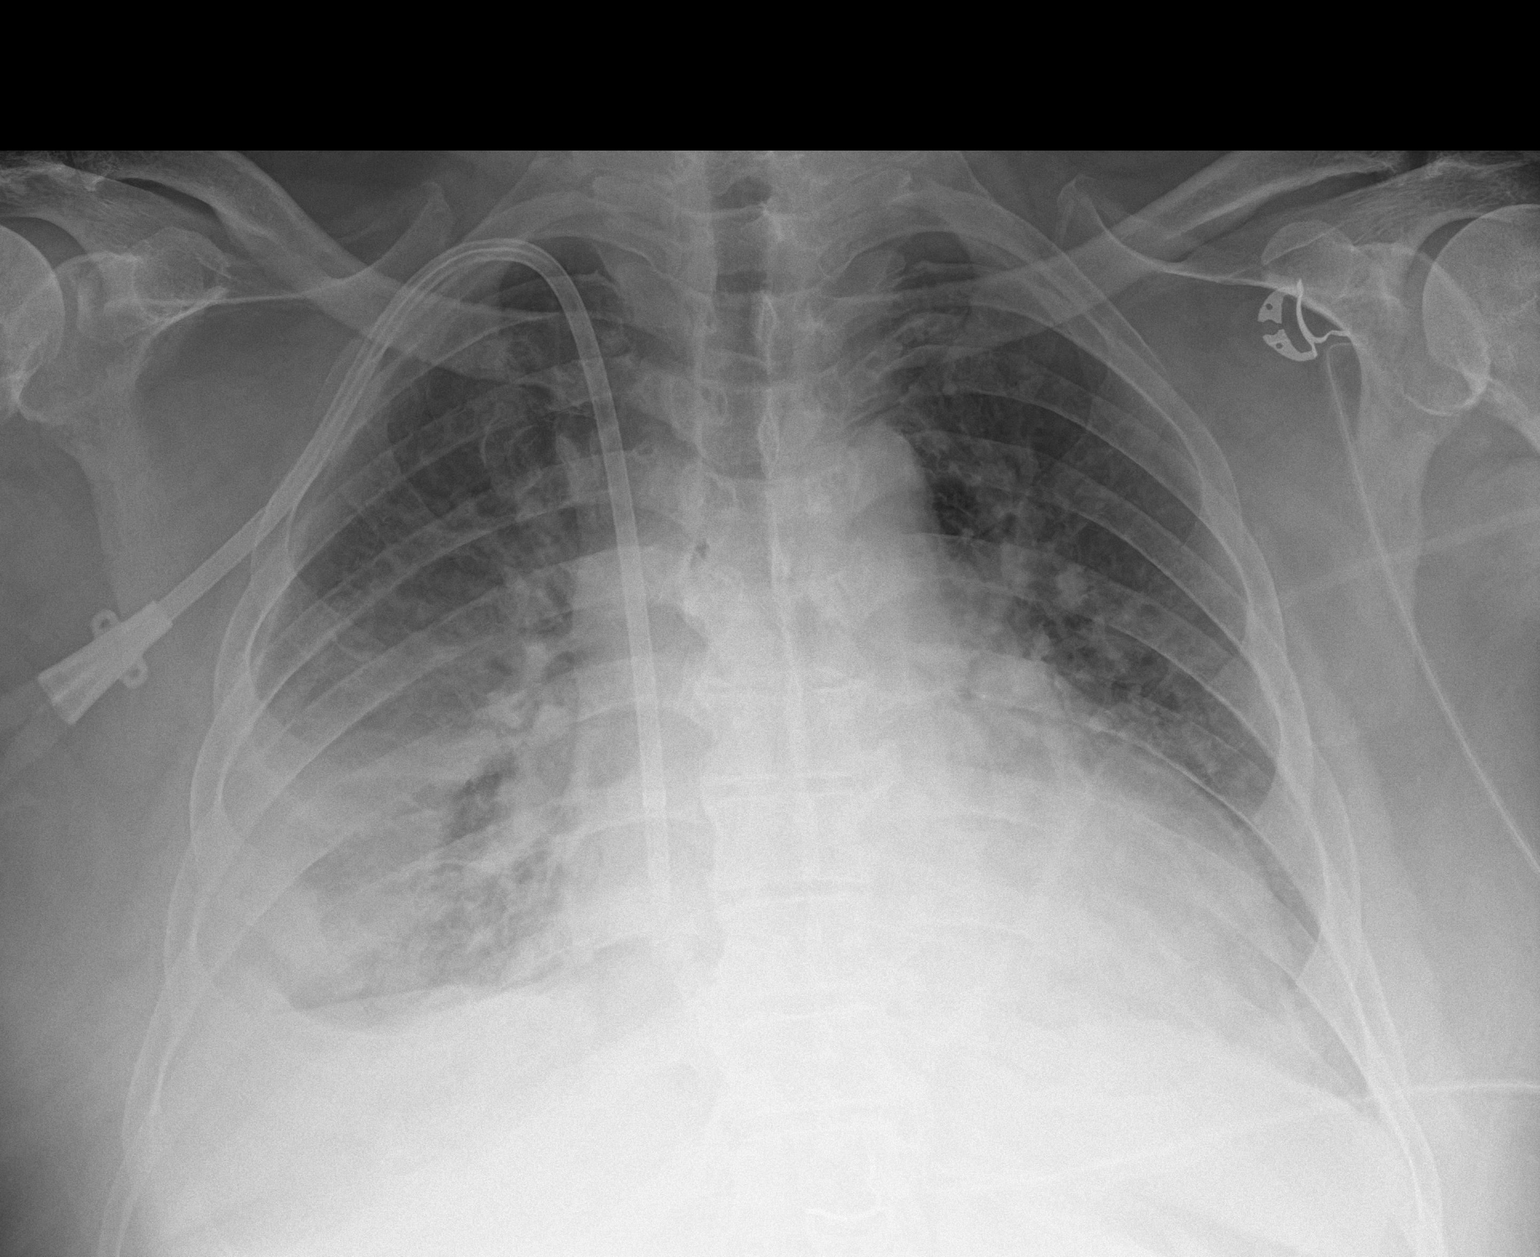

[1 of 1 positions shown; findings below may reference images not displayed]

FINDINGS: Transverse diameter of heart is increased. Central pulmonary vessels
are more prominent. There is increased haziness in the right
parahilar region. There is blunting of right lateral CP angle. There
is improvement in aeration of right lower lung fields. Left lateral
CP angle is clear. There is no pneumothorax. There is interval
placement of dialysis catheter with its tip in the right atrium.
IMPRESSION: Cardiomegaly. Central pulmonary vessels are prominent suggesting
CHF. Haziness in the right mid and right lower lung fields suggests
moderate right pleural effusion. There is improvement in aeration of
right lower lung fields since 07/15/2021, possibly suggesting
decrease in pleural effusion and decrease in atelectasis.

## 2022-03-28 ENCOUNTER — Other Ambulatory Visit (HOSPITAL_COMMUNITY): Payer: 59

## 2022-03-29 ENCOUNTER — Ambulatory Visit (HOSPITAL_COMMUNITY)
Admission: RE | Admit: 2022-03-29 | Discharge: 2022-03-29 | Disposition: A | Discharge status: Home / Self Care | Payer: Medicare Other | Source: Ambulatory Visit | Attending: Cardiology | Admitting: Cardiology

## 2022-03-29 DIAGNOSIS — I5022 Chronic systolic (congestive) heart failure: Secondary | ICD-10-CM | POA: Diagnosis not present

## 2022-03-29 LAB — LIPID PANEL
Cholesterol: 194 mg/dL (ref 0–200)
HDL: 49 mg/dL (ref >40)
LDL Cholesterol: 124 mg/dL — ABNORMAL HIGH (ref 0–99)
Total CHOL/HDL Ratio: 4 RATIO
Triglycerides: 103 mg/dL (ref <150)
VLDL: 21 mg/dL (ref 0–40)

## 2022-03-30 ENCOUNTER — Telehealth (HOSPITAL_COMMUNITY): Payer: Self-pay

## 2022-03-30 DIAGNOSIS — E782 Mixed hyperlipidemia: Secondary | ICD-10-CM

## 2022-03-30 MED ORDER — PRAVASTATIN SODIUM 80 MG PO TABS: 80.0000 mg | ORAL_TABLET | Freq: Every evening | ORAL | 3 refills | Status: DC

## 2022-03-30 NOTE — Telephone Encounter (Addendum)
Pt aware, agreeable, and verbalized understanding  Med list up dated. Labs ordered for [facility]  ----- Message from Larey Dresser, MD sent at 03/29/2022  3:43 PM EDT ----- LDL too high, increase pravastatin to 80 mg daily with lipids/LFTs in 2 months.

## 2022-05-16 ENCOUNTER — Other Ambulatory Visit (HOSPITAL_COMMUNITY): Payer: Medicare Other

## 2022-06-12 ENCOUNTER — Ambulatory Visit: Payer: 59 | Admitting: Cardiology

## 2022-06-12 NOTE — Progress Notes (Deleted)
Clinical Summary [name] is a 56 y.o.male  seen today for follow up of the following medical problems.    1.Chronic diastolic HF - 01/176 Echocardiogram shows a low-normal EF of 50% with no regional WMA. Noted to have severe LVH and Grade 2 DD.   -followed in HF clinic - myeloma panel negative, has not been able to get pyp scan due to insurance or a sleep study - he is on torsemide '80mg'$  bid, metolazone 2.'5mg'$  once weekly though recently metolazone has been on hold - last Cr 4.2, uptrend from 2.7 2 months ago. - notes mention weight had been up to 290 lbs, down to 276 today. Home weight down to 272 lbs.  - some ongoing swelling in legs, though much imprved. SOB improving. No orthopnea.    - now on HD for fluid management. Still makes some urine.  - no recent edema, no SOB/DOE.        2. HTN   - reports low bp's during HD at times.  - reports kidney doctor had stopped labetalol. He is unsure about hydralazine but has also stopped taking.        3. ESRD - followed by nephrology Dr [name] - from last note planning for vacsular consult to consider access     4.Hyperlipidemia - HF clinic increased pravastatin to '80mg'$  daily in 03/2022   Past Medical History:  Diagnosis Date   CHF (congestive heart failure) (HCC)    Diabetes mellitus    Hypertension      No Known Allergies   Current Outpatient Medications  Medication Sig Dispense Refill   acetaminophen (TYLENOL) 325 MG tablet Take 2 tablets (650 mg total) by mouth every 6 (six) hours as needed for mild pain (or Fever >/= 101). 12 tablet 1   calcium acetate (PHOSLO) 667 MG capsule Take 1 capsule (667 mg total) by mouth 3 (three) times daily with meals. 90 capsule 0   furosemide (LASIX) 80 MG tablet Take 1 tablet (80 mg total) by mouth 2 (two) times daily. (Patient not taking: Reported on 11/17/2021) 60 tablet 0   GARLIC PO Take 1 capsule by mouth daily.     glimepiride (AMARYL) 4 MG tablet Take 0.5 tablets (2 mg  total) by mouth 2 (two) times daily. (Patient not taking: Reported on 11/17/2021)     hydrALAZINE (APRESOLINE) 100 MG tablet Take 1 tablet (100 mg total) by mouth 3 (three) times daily. (Patient not taking: Reported on 11/17/2021) 90 tablet 0   labetalol (NORMODYNE) 300 MG tablet Take 2 tablets (600 mg total) by mouth 2 (two) times daily. (Patient not taking: Reported on 11/17/2021) 60 tablet 6   Multiple Vitamin (MULTIVITAMIN WITH MINERALS) TABS tablet Take 1 tablet by mouth daily.     potassium chloride (KLOR-CON) 10 MEQ tablet Take 10 mEq by mouth daily as needed (when feeling good). (Patient not taking: Reported on 11/30/2021)     pravastatin (PRAVACHOL) 80 MG tablet Take 1 tablet (80 mg total) by mouth every evening. 90 tablet 3   vitamin C (ASCORBIC ACID) 500 MG tablet Take 500 mg by mouth daily.     Vitamin D, Ergocalciferol, (DRISDOL) 1.25 MG (50000 UNIT) CAPS capsule Take 50,000 Units by mouth every Friday.     Zinc 50 MG CAPS Take 50 mg by mouth every morning.     No current facility-administered medications for this visit.     Past Surgical History:  Procedure Laterality Date   A/V FISTULAGRAM  Right 11/21/2021   Procedure: A/V Fistulagram;  Surgeon: Larry Sandy, MD;  Location: [place] [facility];  Service: Cardiovascular;  Laterality: Right;   AV FISTULA PLACEMENT Right 07/22/2021   Procedure: RIGHT  ARM FISTULA;  Surgeon: Larry Mitchell, MD;  Location: [facility];  Service: Vascular;  Laterality: Right;   INCISION AND DRAINAGE     INSERTION OF DIALYSIS CATHETER  07/22/2021   Procedure: INSERTION OF DIALYSIS CATHETER;  Surgeon: Larry Mitchell, MD;  Location: [facility];  Service: Vascular;;   KNEE SURGERY       No Known Allergies    Family History  Problem Relation Age of Onset   Dementia Mother    Diabetes Father      Social History [name] reports that he has never smoked. He has never used smokeless tobacco. [name] reports no history of alcohol  use.   Review of Systems CONSTITUTIONAL: No weight loss, fever, chills, weakness or fatigue.  HEENT: Eyes: No visual loss, blurred vision, double vision or yellow sclerae.No hearing loss, sneezing, congestion, runny nose or sore throat.  SKIN: No rash or itching.  CARDIOVASCULAR:  RESPIRATORY: No shortness of breath, cough or sputum.  GASTROINTESTINAL: No anorexia, nausea, vomiting or diarrhea. No abdominal pain or blood.  GENITOURINARY: No burning on urination, no polyuria NEUROLOGICAL: No headache, dizziness, syncope, paralysis, ataxia, numbness or tingling in the extremities. No change in bowel or bladder control.  MUSCULOSKELETAL: No muscle, back pain, joint pain or stiffness.  LYMPHATICS: No enlarged nodes. No history of splenectomy.  PSYCHIATRIC: No history of depression or anxiety.  ENDOCRINOLOGIC: No reports of sweating, cold or heat intolerance. No polyuria or polydipsia.  Marland Kitchen   Physical Examination There were no vitals filed for this visit. There were no vitals filed for this visit.  Gen: resting comfortably, no acute distress HEENT: no scleral icterus, pupils equal round and reactive, no palptable cervical adenopathy,  CV Resp: Clear to auscultation bilaterally GI: abdomen is soft, non-tender, non-distended, normal bowel sounds, no hepatosplenomegaly MSK: extremities are warm, no edema.  Skin: warm, no rash Neuro:  no focal deficits Psych: appropriate affect   Diagnostic Studies     Assessment and Plan   Chronic diastolic HF - fluid management per dialysis - no changes today, has f/u with HF clinic in June   2. HTN - unclear history, appears some issues with low bp's since starting HD and he reports neprhology had stopped his labetalol. He is not sure if he was told to stop his hydrlalazine as well but stopped it on his own due to dizziness.  - defer bp management to neprhology who know more about recent bp issues during HD. Could consider a regimen on no HD  days perhaps in the future.      [name], M.D., F.A.C.C.

## 2022-06-14 ENCOUNTER — Ambulatory Visit: Payer: Medicare Other | Attending: Cardiology | Admitting: Cardiology

## 2022-06-14 ENCOUNTER — Encounter: Payer: Self-pay | Admitting: Cardiology

## 2022-06-14 VITALS — BP 140/76 | HR 90 | Ht 71.0 in | Wt 278.0 lb

## 2022-06-14 DIAGNOSIS — I10 Essential (primary) hypertension: Secondary | ICD-10-CM | POA: Diagnosis not present

## 2022-06-14 DIAGNOSIS — E7849 Other hyperlipidemia: Secondary | ICD-10-CM

## 2022-06-14 DIAGNOSIS — I5032 Chronic diastolic (congestive) heart failure: Secondary | ICD-10-CM | POA: Diagnosis not present

## 2022-06-14 NOTE — Progress Notes (Signed)
Clinical Summary [name] is a 56 y.o.maleseen today for follow up of the following medical problems.    1.Chronic diastolic HF - 10/2593 Echocardiogram shows a low-normal EF of 50% with no regional WMA. Noted to have severe LVH and Grade 2 DD.   -followed in HF clinic - myeloma panel negative, has not been able to get pyp scan due to insurance or a sleep study - he is on torsemide '80mg'$  bid, metolazone 2.'5mg'$  once weekly though recently metolazone has been on hold - last Cr 4.2, uptrend from 2.7 2 months ago. - notes mention weight had been up to 290 lbs, down to 276 today. Home weight down to 272 lbs.  - some ongoing swelling in legs, though much imprved. SOB improving. No orthopnea.    - now on HD for fluid management. Still makes some urine.  - no recent edema, no SOB/DOE.     PYP 12/22 equivocal for TTR amyloid, myeloma panel negative, gene testing for hATTR negative.   - repeat PYP 01/2022 Visual and quantitative assessment (grade 1, H/CL equal 1.0) are NOT suggestive of transthyretin amyloidosis.   - no recent edema, fluid is well controlled with HD. Some DOE with higher levels of activity which is chronic   2. HTN   - reports low bp's during HD at times.  - reports kidney doctor had stopped labetalol and hydralazine       3. ESRD - followed by nephrology Dr Theador Hawthorne - from last note planning for vacsular consult to consider access  -occasional low bp's during HD.   4. Hyperlipidemia - 03/2022 TC 194 TG 103 HDL 49 LDL 124 - based on this panel pravastaqtin increased to '80mg'$  daily - due for repeat lipids and LFTs  Past Medical History:  Diagnosis Date   CHF (congestive heart failure) (HCC)    Diabetes mellitus    Hypertension      No Known Allergies   Current Outpatient Medications  Medication Sig Dispense Refill   acetaminophen (TYLENOL) 325 MG tablet Take 2 tablets (650 mg total) by mouth every 6 (six) hours as needed for mild pain (or Fever >/= 101).  12 tablet 1   calcium acetate (PHOSLO) 667 MG capsule Take 1 capsule (667 mg total) by mouth 3 (three) times daily with meals. 90 capsule 0   furosemide (LASIX) 80 MG tablet Take 1 tablet (80 mg total) by mouth 2 (two) times daily. (Patient not taking: Reported on 11/17/2021) 60 tablet 0   GARLIC PO Take 1 capsule by mouth daily.     glimepiride (AMARYL) 4 MG tablet Take 0.5 tablets (2 mg total) by mouth 2 (two) times daily. (Patient not taking: Reported on 11/17/2021)     hydrALAZINE (APRESOLINE) 100 MG tablet Take 1 tablet (100 mg total) by mouth 3 (three) times daily. (Patient not taking: Reported on 11/17/2021) 90 tablet 0   labetalol (NORMODYNE) 300 MG tablet Take 2 tablets (600 mg total) by mouth 2 (two) times daily. (Patient not taking: Reported on 11/17/2021) 60 tablet 6   Multiple Vitamin (MULTIVITAMIN WITH MINERALS) TABS tablet Take 1 tablet by mouth daily.     potassium chloride (KLOR-CON) 10 MEQ tablet Take 10 mEq by mouth daily as needed (when feeling good). (Patient not taking: Reported on 11/30/2021)     pravastatin (PRAVACHOL) 80 MG tablet Take 1 tablet (80 mg total) by mouth every evening. 90 tablet 3   vitamin C (ASCORBIC ACID) 500 MG tablet Take 500  mg by mouth daily.     Vitamin D, Ergocalciferol, (DRISDOL) 1.25 MG (50000 UNIT) CAPS capsule Take 50,000 Units by mouth every Friday.     Zinc 50 MG CAPS Take 50 mg by mouth every morning.     No current facility-administered medications for this visit.     Past Surgical History:  Procedure Laterality Date   A/V FISTULAGRAM Right 11/21/2021   Procedure: A/V Fistulagram;  Surgeon: Waynetta Sandy, MD;  Location: [place] [facility];  Service: Cardiovascular;  Laterality: Right;   AV FISTULA PLACEMENT Right 07/22/2021   Procedure: RIGHT  ARM FISTULA;  Surgeon: Serafina Mitchell, MD;  Location: [facility];  Service: Vascular;  Laterality: Right;   INCISION AND DRAINAGE     INSERTION OF DIALYSIS CATHETER  07/22/2021   Procedure:  INSERTION OF DIALYSIS CATHETER;  Surgeon: Serafina Mitchell, MD;  Location: [facility];  Service: Vascular;;   KNEE SURGERY       No Known Allergies    Family History  Problem Relation Age of Onset   Dementia Mother    Diabetes Father      Social History [name] reports that he has never smoked. He has never used smokeless tobacco. [name] reports no history of alcohol use.   Review of Systems CONSTITUTIONAL: No weight loss, fever, chills, weakness or fatigue.  HEENT: Eyes: No visual loss, blurred vision, double vision or yellow sclerae.No hearing loss, sneezing, congestion, runny nose or sore throat.  SKIN: No rash or itching.  CARDIOVASCULAR: per hpi RESPIRATORY: No shortness of breath, cough or sputum.  GASTROINTESTINAL: No anorexia, nausea, vomiting or diarrhea. No abdominal pain or blood.  GENITOURINARY: No burning on urination, no polyuria NEUROLOGICAL: No headache, dizziness, syncope, paralysis, ataxia, numbness or tingling in the extremities. No change in bowel or bladder control.  MUSCULOSKELETAL: No muscle, back pain, joint pain or stiffness.  LYMPHATICS: No enlarged nodes. No history of splenectomy.  PSYCHIATRIC: No history of depression or anxiety.  ENDOCRINOLOGIC: No reports of sweating, cold or heat intolerance. No polyuria or polydipsia.  Marland Kitchen   Physical Examination Today's Vitals   06/14/22 1527  BP: (!) 140/76  Pulse: 90  Weight: 278 lb (126.1 kg)  Height: '5\' 11"'$  (1.803 m)   Body mass index is 38.77 kg/m.  Gen: resting comfortably, no acute distress HEENT: no scleral icterus, pupils equal round and reactive, no palptable cervical adenopathy,  CV: RRR, no m/rg no jvd Resp: Clear to auscultation bilaterally GI: abdomen is soft, non-tender, non-distended, normal bowel sounds, no hepatosplenomegaly MSK: extremities are warm, no edema.  Skin: warm, no rash Neuro:  no focal deficits Psych: appropriate affect   Diagnostic Studies     Assessment and  Plan  Chronic diastolic HF - fluid management per dialysis - he is euvolemic today, continue HD per renal   2. HTN -much improved after starting HD, renal has actually stopped his labetalol and hdyralazine. Can have some low bp's during HD at times - acceptable bp give issues during HD, continue to monitor  3. Hyperlipidemia - repeat lipid panel, pravastatin was recnetly in creased to '80mg'$  daily       F/u 6 months     [name], M.D.

## 2022-06-14 NOTE — Patient Instructions (Signed)
Medication Instructions:  Your physician recommends that you continue on your current medications as directed. Please refer to the Current Medication list given to you today.   Labwork: Fasting Lipid Panel Hepatic Function Panel  Testing/Procedures: None  Follow-Up: Follow up with Dr. Harl Bowie in 6 months.   Any Other Special Instructions Will Be Listed Below (If Applicable).     If you need a refill on your cardiac medications before your next appointment, please call your pharmacy.

## 2022-07-05 ENCOUNTER — Other Ambulatory Visit (HOSPITAL_COMMUNITY)
Admission: RE | Admit: 2022-07-05 | Discharge: 2022-07-05 | Disposition: A | Discharge status: Home / Self Care | Payer: Medicare Other | Source: Ambulatory Visit | Attending: Cardiology | Admitting: Cardiology

## 2022-07-05 DIAGNOSIS — E7849 Other hyperlipidemia: Secondary | ICD-10-CM | POA: Diagnosis present

## 2022-07-05 LAB — LIPID PANEL
Cholesterol: 170 mg/dL (ref 0–200)
HDL: 51 mg/dL (ref >40)
LDL Cholesterol: 70 mg/dL (ref 0–99)
Total CHOL/HDL Ratio: 3.3 RATIO
Triglycerides: 244 mg/dL — ABNORMAL HIGH (ref <150)
VLDL: 49 mg/dL — ABNORMAL HIGH (ref 0–40)

## 2022-07-05 LAB — HEPATIC FUNCTION PANEL
ALT: 13 U/L (ref 0–44)
AST: 14 U/L — ABNORMAL LOW (ref 15–41)
Albumin: 3.3 g/dL — ABNORMAL LOW (ref 3.5–5.0)
Alkaline Phosphatase: 100 U/L (ref 38–126)
Bilirubin, Direct: <0.1 mg/dL (ref 0.0–0.2)
Total Bilirubin: 0.3 mg/dL (ref 0.3–1.2)
Total Protein: 7.3 g/dL (ref 6.5–8.1)

## 2022-07-31 ENCOUNTER — Telehealth: Payer: Self-pay

## 2022-07-31 NOTE — Telephone Encounter (Signed)
-----   Message from [name], MD sent at 07/28/2022  9:20 AM EST ----- Cholesterol looks good other than triglycerides are a little elevated, needs to work on cutting back sweets, fried foods.   Zandra Abts MD

## 2022-07-31 NOTE — Telephone Encounter (Signed)
Patient notified and verbalized understanding. Patient had no questions or concerns at this time. PCP copied 

## 2022-08-04 ENCOUNTER — Telehealth (HOSPITAL_COMMUNITY): Payer: Self-pay | Admitting: Cardiology

## 2022-08-30 ENCOUNTER — Encounter (HOSPITAL_COMMUNITY): Payer: Medicare Other | Admitting: Cardiology

## 2022-09-18 ENCOUNTER — Encounter (HOSPITAL_COMMUNITY): Payer: Self-pay | Admitting: Cardiology

## 2022-09-18 ENCOUNTER — Ambulatory Visit (HOSPITAL_COMMUNITY)
Admission: RE | Admit: 2022-09-18 | Discharge: 2022-09-18 | Disposition: A | Discharge status: Home / Self Care | Payer: Medicare Other | Source: Ambulatory Visit | Attending: Cardiology | Admitting: Cardiology

## 2022-09-18 VITALS — BP 148/84 | HR 84 | Wt 282.8 lb

## 2022-09-18 DIAGNOSIS — I5032 Chronic diastolic (congestive) heart failure: Secondary | ICD-10-CM | POA: Diagnosis not present

## 2022-09-18 DIAGNOSIS — N186 End stage renal disease: Secondary | ICD-10-CM | POA: Insufficient documentation

## 2022-09-18 DIAGNOSIS — Z992 Dependence on renal dialysis: Secondary | ICD-10-CM | POA: Diagnosis not present

## 2022-09-18 DIAGNOSIS — I132 Hypertensive heart and chronic kidney disease with heart failure and with stage 5 chronic kidney disease, or end stage renal disease: Secondary | ICD-10-CM | POA: Insufficient documentation

## 2022-09-18 DIAGNOSIS — R531 Weakness: Secondary | ICD-10-CM | POA: Insufficient documentation

## 2022-09-18 DIAGNOSIS — E1122 Type 2 diabetes mellitus with diabetic chronic kidney disease: Secondary | ICD-10-CM | POA: Insufficient documentation

## 2022-09-18 DIAGNOSIS — R0602 Shortness of breath: Secondary | ICD-10-CM | POA: Diagnosis not present

## 2022-09-18 DIAGNOSIS — Z79899 Other long term (current) drug therapy: Secondary | ICD-10-CM | POA: Insufficient documentation

## 2022-09-18 DIAGNOSIS — E785 Hyperlipidemia, unspecified: Secondary | ICD-10-CM | POA: Insufficient documentation

## 2022-09-18 DIAGNOSIS — G4733 Obstructive sleep apnea (adult) (pediatric): Secondary | ICD-10-CM | POA: Insufficient documentation

## 2022-09-18 NOTE — Patient Instructions (Signed)
There was no changes to your medications.  Your physician has requested that you have an echocardiogram. Echocardiography is a painless test that uses sound waves to create images of your heart. It provides your doctor with information about the size and shape of your heart and how well your heart's chambers and valves are working. This procedure takes approximately one hour. There are no restrictions for this procedure. Please do NOT wear cologne, perfume, aftershave, or lotions (deodorant is allowed). Please arrive 15 minutes prior to your appointment time.  Your physician recommends that you schedule a follow-up appointment in: 6 months ( August ) ** please call the office in May to arrange your follow up appointment. **  If you have any questions or concerns before your next appointment please send [place] a message through [place] or call our office at [phone].    TO LEAVE A MESSAGE FOR THE NURSE SELECT OPTION 2, PLEASE LEAVE A MESSAGE INCLUDING: YOUR NAME DATE OF BIRTH CALL BACK NUMBER REASON FOR CALL**this is important as we prioritize the call backs  YOU WILL RECEIVE A CALL BACK THE SAME DAY AS LONG AS YOU CALL BEFORE 4:00 PM  At the [facility], you and your health needs are our priority. As part of our continuing mission to provide you with exceptional heart care, we have created designated Provider Care Teams. These Care Teams include your primary Cardiologist (physician) and Advanced Practice Providers (APPs- Physician Assistants and Nurse Practitioners) who all work together to provide you with the care you need, when you need it.   You may see any of the following providers on your designated Care Team at your next follow up: Dr Glori Bickers Dr Loralie Champagne Dr. Roxana Hires, NP Lyda Jester, [place] [facility] [place], [place] Forestine Na, NP Audry Riles, PharmD   Please be sure to bring in all your medications bottles to every  appointment.    Thank you for choosing [facility]

## 2022-09-18 NOTE — Progress Notes (Signed)
PCP: [name], MD Cardiology: Dr. Harl Bowie HF Cardiology: Dr. Aundra Dubin  57 y.o. with history of HTN, type 2 diabetes, CKD stage 3, and chronic diastolic CHF was referred by Dr. Harl Bowie for evaluation of diastolic CHF.  For > 1 year now, patient has had significant exertional dyspnea.  He has been admitted several times in the last year, most recently in 3/22.  Echo in 9/21 showed EF 50%, moderate LVH, grade 2 diastolic dysfunction, normal RV size and systolic function.  Creatinine elevated, he is followed by nephrology.  Echo in 9/22 with EF 55-60%, moderate LVH.   Patient was admitted in 9/22 with CHF exacerbation.   Volume overloaded at post hospital follow up 06/24/21. Metolazone increased to 5 mg 5 days a week. Carvedilol stopped as he had been taking this with labetalol. Lisinopril subsequently stopped when labs showed SCr 5.6.  Admitted 07/15/21 from clinic with CHF exacerbation. Dialysis initiated this admission, s/p AVF creation. PYP scan equivocal for TTR cardiac amyloidosis, gene testing negative. Myeloma panel negative.  Repeat PYP scan in 7/23 was clearly negative.   He returns for followup of diastolic CHF.  He is now off all cardiac meds in setting of dialysis.  Stable overall, doing ok with HD.  Still tires easily and weak walking up stairs.  Short of breath with yardwork. No chest pain.  No lightheadedness.   He is interested in getting a kidney transplant.   Labs (5/22): K 4.5, creatinine 2.65, myeloma panel negative Labs (6/22): K 4.7, creatinine 4.4, BUN 77, urine immunofixation negative Labs (11/22): K 4.9, creatinine 3.74 Labs (12/22): K 5.6, creatinine 4.18 Labs (12/23): LDL 70  PMH; 1. HTN 2. Type 2 diabetes 3. ESRD: Suspect diabetic nephropathy.   4. Chronic diastolic CHF: Echo in 0000000 with EF 50%, moderate LVH, grade 2 diastolic dysfunction, normal RV size and systolic function.  - Echo (9/22): EF 55-60%, moderate LVH, RV normal - PYP (12/22): Visual and  quantitative assessment (grade 2, H/CLL equal 1.18) are equivocal for transthyretin amyloidosis. Genetic testing for TTR cardiac amyloidosis mutations was negative.  - PYP scan (7/23): not suggestive of TTR cardiac amyloidosis (negative).   Social History   Socioeconomic History   Marital status: Married    Spouse name: Not on file   Number of children: Not on file   Years of education: Not on file   Highest education level: Not on file  Occupational History   Not on file  Tobacco Use   Smoking status: Never   Smokeless tobacco: Never  Vaping Use   Vaping Use: Never used  Substance and Sexual Activity   Alcohol use: No   Drug use: No   Sexual activity: Not on file  Other Topics Concern   Not on file  Social History Narrative   Not on file   Social Determinants of Health   Financial Resource Strain: High Risk (11/29/2020)   Overall Financial Resource Strain (CARDIA)    Difficulty of Paying Living Expenses: Hard  Food Insecurity: Food Insecurity Present (12/29/2020)   Hunger Vital Sign    Worried About Running Out of Food in the Last Year: Sometimes true    Ran Out of Food in the Last Year: Never true  Transportation Needs: No Transportation Needs (11/29/2020)   PRAPARE - Hydrologist (Medical): No    Lack of Transportation (Non-Medical): No  Physical Activity: Not on file  Stress: Not on file  Social Connections: Not on file  Intimate  Partner Violence: Not on file   Family History  Problem Relation Age of Onset   Dementia Mother    Diabetes Father    ROS: All systems reviewed and negative except as per HPI.   Current Outpatient Medications  Medication Sig Dispense Refill   acetaminophen (TYLENOL) 325 MG tablet Take 2 tablets (650 mg total) by mouth every 6 (six) hours as needed for mild pain (or Fever >/= 101). 12 tablet 1   calcium acetate (PHOSLO) 667 MG capsule Take 1 capsule (667 mg total) by mouth 3 (three) times daily with meals. 90  capsule 0   GARLIC PO Take 1 capsule by mouth daily.     lanthanum (FOSRENOL) 1000 MG chewable tablet Chew 1,000 mg by mouth 2 (two) times daily with a meal.     Multiple Vitamin (MULTIVITAMIN WITH MINERALS) TABS tablet Take 1 tablet by mouth daily.     pravastatin (PRAVACHOL) 80 MG tablet Take 1 tablet (80 mg total) by mouth every evening. 90 tablet 3   vitamin C (ASCORBIC ACID) 500 MG tablet Take 500 mg by mouth daily.     Zinc 50 MG CAPS Take 50 mg by mouth every morning.     No current facility-administered medications for this encounter.   BP (!) 148/84   Pulse 84   Wt 128.3 kg (282 lb 12.8 oz)   SpO2 97%   BMI 39.44 kg/m   Wt Readings from Last 3 Encounters:  09/18/22 128.3 kg (282 lb 12.8 oz)  06/14/22 126.1 kg (278 lb)  01/23/22 130.6 kg (288 lb)   General: NAD Neck: No JVD, no thyromegaly or thyroid nodule.  Lungs: Clear to auscultation bilaterally with normal respiratory effort. CV: Nondisplaced PMI.  Heart regular S1/S2, no S3/S4, no murmur.  No peripheral edema.  No carotid bruit.  Normal pedal pulses.  Abdomen: Soft, nontender, no hepatosplenomegaly, no distention.  Skin: Intact without lesions or rashes.  Neurologic: Alert and oriented x 3.  Psych: Normal affect. Extremities: No clubbing or cyanosis.  HEENT: Normal.   Assessment/Plan: 1. Chronic diastolic CHF: Most recent echo in 9/22 showed EF 55-60%, moderate LVH, grade 2 diastolic dysfunction, normal RV size and systolic function.  Multiple admissions for CHF.  Most recent in 12/22, dialysis initiated. PYP 12/22 equivocal for TTR amyloid, myeloma panel negative, gene testing for hATTR negative. Repeat PYP scan in 7/23 was negative.  Volume seems much better controlled on HD.  NYHA class II.  Off all BP meds now.  - With consideration for renal transplant in future, will repeat echo to reassess LV/RV function.  2. ESRD: Doing well with dialysis, off BP meds and Lasix.  3. OSA: Refuses CPAP. 4. HTN: Controlled  with HD.   5. Hyperlipidemia: Continue pravastatin, good lipids in 12/23.    Followup 6 months with APP.   Loralie Champagne 09/18/2022

## 2022-09-20 ENCOUNTER — Encounter: Payer: Self-pay | Admitting: *Deleted

## 2022-10-16 DIAGNOSIS — Z992 Dependence on renal dialysis: Secondary | ICD-10-CM | POA: Insufficient documentation

## 2022-10-18 ENCOUNTER — Ambulatory Visit (HOSPITAL_COMMUNITY)
Admission: RE | Admit: 2022-10-18 | Discharge: 2022-10-18 | Disposition: A | Discharge status: Home / Self Care | Payer: Medicare Other | Source: Ambulatory Visit | Attending: Family Medicine | Admitting: Family Medicine

## 2022-10-18 DIAGNOSIS — Z992 Dependence on renal dialysis: Secondary | ICD-10-CM | POA: Insufficient documentation

## 2022-10-18 DIAGNOSIS — I132 Hypertensive heart and chronic kidney disease with heart failure and with stage 5 chronic kidney disease, or end stage renal disease: Secondary | ICD-10-CM | POA: Diagnosis not present

## 2022-10-18 DIAGNOSIS — I5032 Chronic diastolic (congestive) heart failure: Secondary | ICD-10-CM | POA: Insufficient documentation

## 2022-10-18 DIAGNOSIS — E669 Obesity, unspecified: Secondary | ICD-10-CM | POA: Diagnosis not present

## 2022-10-18 DIAGNOSIS — E1122 Type 2 diabetes mellitus with diabetic chronic kidney disease: Secondary | ICD-10-CM | POA: Insufficient documentation

## 2022-10-18 DIAGNOSIS — N186 End stage renal disease: Secondary | ICD-10-CM | POA: Diagnosis not present

## 2022-10-18 LAB — ECHOCARDIOGRAM COMPLETE
AR max vel: 2.55 cm2
AV Area VTI: 2.4 cm2
AV Area mean vel: 2.27 cm2
AV Mean grad: 3 mmHg
AV Peak grad: 5.3 mmHg
Ao pk vel: 1.15 m/s
Area-P 1/2: 7.51 cm2
MV VTI: 2.66 cm2
S' Lateral: 3.3 cm

## 2022-10-18 NOTE — Progress Notes (Signed)
*  PRELIMINARY RESULTS* Echocardiogram 2D Echocardiogram has been performed.  Elpidio Anis 10/18/2022, 9:53 AM

## 2022-11-30 ENCOUNTER — Ambulatory Visit: Payer: Medicare Other | Admitting: Cardiology

## 2022-12-14 ENCOUNTER — Encounter: Payer: Self-pay | Admitting: Cardiology

## 2022-12-14 ENCOUNTER — Ambulatory Visit: Payer: Medicare Other | Attending: Cardiology | Admitting: Cardiology

## 2022-12-14 VITALS — BP 142/80 | HR 92 | Ht 71.0 in | Wt 280.2 lb

## 2022-12-14 DIAGNOSIS — E782 Mixed hyperlipidemia: Secondary | ICD-10-CM

## 2022-12-14 DIAGNOSIS — N186 End stage renal disease: Secondary | ICD-10-CM | POA: Diagnosis not present

## 2022-12-14 DIAGNOSIS — I10 Essential (primary) hypertension: Secondary | ICD-10-CM

## 2022-12-14 DIAGNOSIS — I5032 Chronic diastolic (congestive) heart failure: Secondary | ICD-10-CM | POA: Diagnosis not present

## 2022-12-14 DIAGNOSIS — Z992 Dependence on renal dialysis: Secondary | ICD-10-CM

## 2022-12-14 NOTE — Progress Notes (Signed)
Cardiology Office Note:   Date:  12/14/2022  ID:  Derry Lory, DOB 05/17/1966, MRN [id number]  History of Present Illness:   [name] is a 57 y.o. male with a past medical history of hypertension, type 2 diabetes, end-stage renal disease, chronic diastolic congestive heart failure, hyperlipidemia, who is here today for follow-up.  Patient was recently referred to advanced heart failure clinic as for greater than 1 year he had had significant exertional dyspnea.  Had been admitted to the hospital several times within the last year with the most recent being 3/22.  Echocardiogram in 9/21 showed EF 50%, moderate LVH, G2 DD, normal RV size and systolic function.  Echo in 9/22 with EF 55-60%, moderate LVH.  PYP scan equivocal for ATTR cardiac amyloidosis, gene testing negative.  Myeloma panel negative.  Repeat PYP scan in 7/23 negative.  He was last seen in clinic on 09/18/2022 by Dr. Shirlee Latch in advanced heart failure clinic.  At that time he had been off of all cardiac meds in the setting of dialysis was stable overall and doing okay.  Tired easily and weak when walking upstairs.  He was short of breath with yard work.  He was scheduled for an echocardiogram and a 16-month follow-up.  He returns to clinic today stating that overall he has been doing fairly well.  He does have fatigue he states is related to his hemodialysis.  He does note that during dialysis he does have drops in his pressure where they have to stop the machine and lean back for approximately 5 to 10 minutes and his blood pressure will come back up and they can restart his dialysis.  He states he runs for approximately 4 hours.  He is currently not on any antihypertensive medications or fluid medications as this is primarily managed through dialysis.  He denies any hospitalizations or visits to the emergency department.  ROS: 10 point review of systems has been reviewed and considered negative with exception of what is listed in the  HPI  Studies Reviewed:    EKG: No new tracings were completed today  TTE 10/18/22 1. Left ventricular ejection fraction, by estimation, is 55 to 60%. The  left ventricle has normal function. Left ventricular endocardial border  not optimally defined to evaluate regional wall motion. Left ventricular  diastolic parameters are consistent  with Grade I diastolic dysfunction (impaired relaxation).   2. Right ventricular systolic function is normal. The right ventricular  size is normal. Tricuspid regurgitation signal is inadequate for assessing  PA pressure.   3. The mitral valve is normal in structure. Trivial mitral valve  regurgitation.   4. The aortic valve is tricuspid. Aortic valve regurgitation is not  visualized. Aortic valve sclerosis is present, with no evidence of aortic  valve stenosis.   5. The inferior vena cava is normal in size with greater than 50%  respiratory variability, suggesting right atrial pressure of 3 mmHg.    Risk Assessment/Calculations:          Physical Exam:   VS:  BP (!) 142/80   Pulse 92   Ht 5\' 11"  (1.803 m)   Wt 280 lb 3.2 oz (127.1 kg)   SpO2 95%   BMI 39.08 kg/m    Wt Readings from Last 3 Encounters:  12/14/22 280 lb 3.2 oz (127.1 kg)  09/18/22 282 lb 12.8 oz (128.3 kg)  06/14/22 278 lb (126.1 kg)     GEN: Well nourished, well developed in no acute distress  NECK: No JVD; No carotid bruits CARDIAC: RRR, no murmurs, rubs, gallops RESPIRATORY:  Clear to auscultation without rales, wheezing or rhonchi  ABDOMEN: Soft, non-tender, non-distended EXTREMITIES:  No edema; No deformity AV fistula right brachial positive for bruit and thrill  ASSESSMENT AND PLAN:   Chronic diastolic congestive heart failure rate denies any shortness of breath or peripheral edema.  He appears euvolemic on exam today.  Fluid management is being done by dialysis.  He also continues to follow with advanced heart failure.  Essential hypertension with blood pressure  today of 142/80.  Patient is currently no longer on antihypertensive medications due to drops in his blood pressure on dialysis.  Blood pressure is still better controlled since being on dialysis.  Has been encouraged to monitor his pressures as well and to avoid high sodium foods as and prepackaged, preprocessed, and fast foods.  Mixed hyperlipidemia with his last LDL in 10/15/2022 of 159.  Patient has not been compliant with a statin medication and states that he will restart taking his pravastatin 80 mg daily in the evening.  Will have fasting lipid panel and hepatic panel in 3 months after being back on his medications.  End-stage renal disease with hemodialysis on Tuesday, Thursday, Saturdays.  This continues to be managed by nephrology  Disposition patient return to clinic to see MD/APP in 6 months or sooner if needed with an EKG on return.        Signed, Alister Staver, NP

## 2022-12-14 NOTE — Patient Instructions (Signed)
Medication Instructions:  Your physician recommends that you continue on your current medications as directed. Please refer to the Current Medication list given to you today.   *If you need a refill on your cardiac medications before your next appointment, please call your pharmacy*   Lab Work: Your physician recommends that you return for lab work in: 3 Months ( August)   If you have labs (blood work) drawn today and your tests are completely normal, you will receive your results only by: MyChart Message (if you have MyChart) OR A paper copy in the mail If you have any lab test that is abnormal or we need to change your treatment, we will call you to review the results.   Testing/Procedures: NONE    Follow-Up: At [facility], you and your health needs are our priority.  As part of our continuing mission to provide you with exceptional heart care, we have created designated Provider Care Teams.  These Care Teams include your primary Cardiologist (physician) and Advanced Practice Providers (APPs -  Physician Assistants and Nurse Practitioners) who all work together to provide you with the care you need, when you need it.  We recommend signing up for the patient portal called "MyChart".  Sign up information is provided on this After Visit Summary.  MyChart is used to connect with patients for Virtual Visits (Telemedicine).  Patients are able to view lab/test results, encounter notes, upcoming appointments, etc.  Non-urgent messages can be sent to your provider as well.   To learn more about what you can do with MyChart, go to [email or web address].    Your next appointment:   6 month(s)  Provider:   You may see Dina Rich, MD or one of the following Advanced Practice Providers on your designated Care Team:   Randall An, PA-C  Jacolyn Reedy, [place]     Other Instructions Thank you for choosing [facility]!

## 2023-02-06 ENCOUNTER — Ambulatory Visit: Payer: Medicare Other | Admitting: Physician Assistant

## 2023-02-09 ENCOUNTER — Ambulatory Visit: Payer: Medicare Other | Admitting: Internal Medicine

## 2023-02-19 ENCOUNTER — Ambulatory Visit: Payer: Medicare Other | Admitting: Internal Medicine

## 2023-02-20 ENCOUNTER — Encounter: Payer: Self-pay | Admitting: Family Medicine

## 2023-02-27 ENCOUNTER — Encounter: Payer: Self-pay | Admitting: *Deleted

## 2023-04-24 ENCOUNTER — Encounter: Payer: Self-pay | Admitting: Internal Medicine

## 2023-04-24 ENCOUNTER — Encounter: Payer: Self-pay | Admitting: *Deleted

## 2023-04-24 ENCOUNTER — Ambulatory Visit: Payer: Medicare Other | Admitting: Internal Medicine

## 2023-04-24 VITALS — BP 117/78 | HR 73 | Resp 16 | Ht 71.0 in | Wt 274.0 lb

## 2023-04-24 DIAGNOSIS — Z1211 Encounter for screening for malignant neoplasm of colon: Secondary | ICD-10-CM | POA: Diagnosis not present

## 2023-04-24 DIAGNOSIS — E1169 Type 2 diabetes mellitus with other specified complication: Secondary | ICD-10-CM | POA: Diagnosis not present

## 2023-04-24 DIAGNOSIS — E1121 Type 2 diabetes mellitus with diabetic nephropathy: Secondary | ICD-10-CM | POA: Diagnosis not present

## 2023-04-24 DIAGNOSIS — Z992 Dependence on renal dialysis: Secondary | ICD-10-CM

## 2023-04-24 DIAGNOSIS — I10 Essential (primary) hypertension: Secondary | ICD-10-CM

## 2023-04-24 DIAGNOSIS — E785 Hyperlipidemia, unspecified: Secondary | ICD-10-CM

## 2023-04-24 DIAGNOSIS — N186 End stage renal disease: Secondary | ICD-10-CM

## 2023-04-24 DIAGNOSIS — G4733 Obstructive sleep apnea (adult) (pediatric): Secondary | ICD-10-CM

## 2023-04-24 DIAGNOSIS — I5032 Chronic diastolic (congestive) heart failure: Secondary | ICD-10-CM

## 2023-04-24 DIAGNOSIS — Z2821 Immunization not carried out because of patient refusal: Secondary | ICD-10-CM

## 2023-04-24 MED ORDER — PRAVASTATIN SODIUM 80 MG PO TABS: 80.0000 mg | ORAL_TABLET | Freq: Every evening | ORAL | 3 refills | Status: DC

## 2023-04-24 NOTE — Progress Notes (Signed)
New Patient Office Visit  Subjective    Patient ID: [name], male    DOB: [birth year]  Age: 57 y.o. MRN: [id number]  CC:  Chief Complaint  Patient presents with   Establish Care    Needs to have a colonoscopy so he can be placed on the kidney transplant list   HPI [name] presents to establish care.  He is a 57 year old male with a past medical history significant for ESRD on HD (T, TH, S), HLD, T2DM, HTN, diastolic HF, and OSA.  Previously followed by Dr. Sudie Bailey.  [name] reports feeling well today.  His acute concern is requesting a referral for screening colonoscopy.  He is currently undergoing transplant evaluation at [facility] and needs a colonoscopy as part of his workup.  He is otherwise asymptomatic and has no additional concerns to discuss.  [name] is currently unemployed (on disability).  He denies a history of tobacco, alcohol, and illicit drug use.  He is unaware of any significant family medical history.  Acute concerns, chronic medical conditions, and outstanding preventative care items discussed today are individually addressed in A/P below.  Outpatient Encounter Medications as of 04/24/2023  Medication Sig   GARLIC PO Take 1 capsule by mouth daily.   lanthanum (FOSRENOL) 1000 MG chewable tablet Chew 1,000 mg by mouth 3 (three) times daily.   Magnesium 400 MG CAPS Take by mouth.   Multiple Vitamin (MULTIVITAMIN WITH MINERALS) TABS tablet Take 1 tablet by mouth daily.   vitamin C (ASCORBIC ACID) 500 MG tablet Take 500 mg by mouth daily.   Zinc 50 MG CAPS Take 50 mg by mouth every morning.   [DISCONTINUED] pravastatin (PRAVACHOL) 80 MG tablet Take 1 tablet (80 mg total) by mouth every evening.   pravastatin (PRAVACHOL) 80 MG tablet Take 1 tablet (80 mg total) by mouth every evening.   [DISCONTINUED] acetaminophen (TYLENOL) 325 MG tablet Take 2 tablets (650 mg total) by mouth every 6 (six) hours as needed for mild pain (or Fever >/= 101).    [DISCONTINUED] calcium acetate (PHOSLO) 667 MG capsule Take 1 capsule (667 mg total) by mouth 3 (three) times daily with meals. (Patient not taking: Reported on 12/14/2022)   [DISCONTINUED] lanthanum (FOSRENOL) 1000 MG chewable tablet Chew 1,000 mg by mouth 2 (two) times daily with a meal.   No facility-administered encounter medications on file as of 04/24/2023.    Past Medical History:  Diagnosis Date   CHF (congestive heart failure) (HCC)    Diabetes mellitus    Hypertension     Past Surgical History:  Procedure Laterality Date   A/V FISTULAGRAM Right 11/21/2021   Procedure: A/V Fistulagram;  Surgeon: Maeola Harman, MD;  Location: [facility] INVASIVE CV LAB;  Service: Cardiovascular;  Laterality: Right;   AV FISTULA PLACEMENT Right 07/22/2021   Procedure: RIGHT  ARM FISTULA;  Surgeon: Nada Libman, MD;  Location: [facility] OR;  Service: Vascular;  Laterality: Right;   INCISION AND DRAINAGE     INSERTION OF DIALYSIS CATHETER  07/22/2021   Procedure: INSERTION OF DIALYSIS CATHETER;  Surgeon: Nada Libman, MD;  Location: [facility] OR;  Service: Vascular;;   KNEE SURGERY      Family History  Problem Relation Age of Onset   Dementia Mother    Diabetes Father     Social History   Socioeconomic History   Marital status: Married    Spouse name: Not on file   Number of children: Not on file  Years of education: Not on file   Highest education level: Not on file  Occupational History   Not on file  Tobacco Use   Smoking status: Never   Smokeless tobacco: Never  Vaping Use   Vaping status: Never Used  Substance and Sexual Activity   Alcohol use: No   Drug use: No   Sexual activity: Not on file  Other Topics Concern   Not on file  Social History Narrative   Not on file   Social Determinants of Health   Financial Resource Strain: High Risk (11/29/2020)   Overall Financial Resource Strain (CARDIA)    Difficulty of Paying Living Expenses: Hard  Food Insecurity: Food  Insecurity Present (12/29/2020)   Hunger Vital Sign    Worried About Running Out of Food in the Last Year: Sometimes true    Ran Out of Food in the Last Year: Never true  Transportation Needs: No Transportation Needs (11/29/2020)   PRAPARE - Administrator, Civil Service (Medical): No    Lack of Transportation (Non-Medical): No  Physical Activity: Not on file  Stress: Not on file  Social Connections: Not on file  Intimate Partner Violence: Not on file   Review of Systems  Constitutional:  Negative for chills and fever.  HENT:  Negative for sore throat.   Respiratory:  Negative for cough and shortness of breath.   Cardiovascular:  Negative for chest pain, palpitations and leg swelling.  Gastrointestinal:  Negative for abdominal pain, blood in stool, constipation, diarrhea, nausea and vomiting.  Genitourinary:  Negative for dysuria and hematuria.  Musculoskeletal:  Negative for myalgias.  Skin:  Negative for itching and rash.  Neurological:  Negative for dizziness and headaches.  Psychiatric/Behavioral:  Negative for depression and suicidal ideas.     Objective    BP 117/78   Pulse 73   Resp 16   Ht 5\' 11"  (1.803 m)   Wt 274 lb (124.3 kg)   SpO2 90%   BMI 38.22 kg/m   Physical Exam Vitals reviewed.  Constitutional:      General: He is not in acute distress.    Appearance: Normal appearance. He is obese. He is not ill-appearing.  HENT:     Head: Normocephalic and atraumatic.     Right Ear: External ear normal.     Left Ear: External ear normal.     Nose: Nose normal. No congestion or rhinorrhea.     Mouth/Throat:     Mouth: Mucous membranes are moist.     Pharynx: Oropharynx is clear.  Eyes:     General: No scleral icterus.    Extraocular Movements: Extraocular movements intact.     Conjunctiva/sclera: Conjunctivae normal.     Pupils: Pupils are equal, round, and reactive to light.  Cardiovascular:     Rate and Rhythm: Normal rate and regular rhythm.      Pulses: Normal pulses.     Heart sounds: Normal heart sounds. No murmur heard. Pulmonary:     Effort: Pulmonary effort is normal.     Breath sounds: Normal breath sounds. No wheezing, rhonchi or rales.  Abdominal:     General: Abdomen is flat. Bowel sounds are normal. There is no distension.     Palpations: Abdomen is soft.     Tenderness: There is no abdominal tenderness.  Musculoskeletal:        General: No swelling or deformity. Normal range of motion.     Cervical back: Normal range of motion.  Comments: Right forearm AV fistula with a palpable pulse and audible bruit.  Skin:    General: Skin is warm and dry.     Capillary Refill: Capillary refill takes less than 2 seconds.  Neurological:     General: No focal deficit present.     Mental Status: He is alert and oriented to person, place, and time.     Motor: No weakness.  Psychiatric:        Mood and Affect: Mood normal.        Behavior: Behavior normal.        Thought Content: Thought content normal.    Assessment & Plan:   Problem List Items Addressed This Visit       Essential hypertension    History of essential hypertension.  BP today is 117/78.  He is not currently prescribed any antihypertensive medication.  There is no indication to resume treatment today.      Chronic diastolic heart failure (HCC)    Previous documented history of diastolic HF.  Euvolemic on exam today.  Volume control with HD.  Not on any medications currently.      OSA (obstructive sleep apnea)    Noted history of OSA.  He has previously refused CPAP.      Type 2 diabetes with nephropathy (HCC)    Diet controlled.  Repeat A1c ordered today.  Not currently on any medications for management of diabetes mellitus.      Hyperlipidemia associated with type 2 diabetes mellitus (HCC)    He is currently prescribed pravastatin 80 mg daily.  Lipid panel ordered today.  Pravastatin refilled.      ESRD (end stage renal disease) on dialysis  [facility])    History of ESRD on HD Tuesday, Thursday, and Saturday.  He is currently undergoing transplant candidacy evaluation at [facility].  Requesting referral for screening colonoscopy today as part of transplant workup. -Gastroenterology referral placed for screening colonoscopy at patient's request.      Colon cancer screening - Primary    Gastroenterology referral placed today for screening colonoscopy.      Return in about 3 months (around 07/25/2023).   Billie Lade, MD

## 2023-04-24 NOTE — Patient Instructions (Signed)
It was a pleasure to see you today.  Thank you for giving [place] the opportunity to be involved in your care.  Below is a brief recap of your visit and next steps.  We will plan to see you again in 3 months.  Summary You have established care today. We will check basic labs Pravastatin refilled Gastroenterology referral placed Follow up in 3 months

## 2023-05-02 ENCOUNTER — Encounter: Payer: Self-pay | Admitting: Internal Medicine

## 2023-05-02 DIAGNOSIS — N186 End stage renal disease: Secondary | ICD-10-CM | POA: Insufficient documentation

## 2023-05-02 DIAGNOSIS — E785 Hyperlipidemia, unspecified: Secondary | ICD-10-CM | POA: Insufficient documentation

## 2023-05-02 DIAGNOSIS — E1169 Type 2 diabetes mellitus with other specified complication: Secondary | ICD-10-CM | POA: Insufficient documentation

## 2023-05-02 DIAGNOSIS — I5032 Chronic diastolic (congestive) heart failure: Secondary | ICD-10-CM | POA: Insufficient documentation

## 2023-05-02 DIAGNOSIS — G4733 Obstructive sleep apnea (adult) (pediatric): Secondary | ICD-10-CM | POA: Insufficient documentation

## 2023-05-02 DIAGNOSIS — Z1211 Encounter for screening for malignant neoplasm of colon: Secondary | ICD-10-CM | POA: Insufficient documentation

## 2023-05-02 NOTE — Assessment & Plan Note (Signed)
Gastroenterology referral placed today for screening colonoscopy. 

## 2023-05-02 NOTE — Assessment & Plan Note (Signed)
History of essential hypertension.  BP today is 117/78.  He is not currently prescribed any antihypertensive medication.  There is no indication to resume treatment today.

## 2023-05-02 NOTE — Assessment & Plan Note (Signed)
He is currently prescribed pravastatin 80 mg daily.  Lipid panel ordered today.  Pravastatin refilled.

## 2023-05-02 NOTE — Assessment & Plan Note (Signed)
Diet controlled.  Repeat A1c ordered today.  Not currently on any medications for management of diabetes mellitus.

## 2023-05-02 NOTE — Assessment & Plan Note (Signed)
Previous documented history of diastolic HF.  Euvolemic on exam today.  Volume control with HD.  Not on any medications currently.

## 2023-05-02 NOTE — Assessment & Plan Note (Signed)
Noted history of OSA.  He has previously refused CPAP.

## 2023-05-02 NOTE — Assessment & Plan Note (Addendum)
History of ESRD on HD Tuesday, Thursday, and Saturday.  He is currently undergoing transplant candidacy evaluation at [facility].  Requesting referral for screening colonoscopy today as part of transplant workup. -Gastroenterology referral placed for screening colonoscopy at patient's request.

## 2023-05-03 ENCOUNTER — Telehealth: Payer: Self-pay | Admitting: Internal Medicine

## 2023-05-03 NOTE — Telephone Encounter (Signed)
  Procedure: Colonoscopy  Estimated body mass index is 38.22 kg/m as calculated from the following:   Height as of 04/24/23: 5\' 11"  (1.803 m).   Weight as of 04/24/23: 274 lb (124.3 kg).   Have you had a colonoscopy before?  no  Do you have family history of colon cancer?  no  Do you have a family history of polyps? no  Previous colonoscopy with polyps removed? no  Do you have a history colorectal cancer?   no  Are you diabetic?  yes  Do you have a prosthetic or mechanical heart valve? no  Do you have a pacemaker/defibrillator?   no  Have you had endocarditis/atrial fibrillation?  no  Do you use supplemental oxygen/CPAP?  no  Have you had joint replacement within the last 12 months?  no  Do you tend to be constipated or have to use laxatives?  no   Do you have history of alcohol use? If yes, how much and how often.  no  Do you have history or are you using drugs? If yes, what do are you  using?  no  Have you ever had a stroke/heart attack?  no  Have you ever had a heart or other vascular stent placed,?no  Do you take weight loss medication? no   Do you take any blood-thinning medications such as: (Plavix, aspirin, Coumadin, Aggrenox, Brilinta, Xarelto, Eliquis, Pradaxa, Savaysa or Effient)? no  If yes we need the name, milligram, dosage and who is prescribing doctor:               Current Outpatient Medications  Medication Sig Dispense Refill   GARLIC PO Take 1 capsule by mouth daily.     lanthanum (FOSRENOL) 1000 MG chewable tablet Chew 1,000 mg by mouth 3 (three) times daily.     Magnesium 400 MG CAPS Take by mouth.     Multiple Vitamin (MULTIVITAMIN WITH MINERALS) TABS tablet Take 1 tablet by mouth daily.     pravastatin (PRAVACHOL) 80 MG tablet Take 1 tablet (80 mg total) by mouth every evening. 90 tablet 3   vitamin C (ASCORBIC ACID) 500 MG tablet Take 500 mg by mouth daily.     Zinc 50 MG CAPS Take 50 mg by mouth every morning.     No current  facility-administered medications for this visit.    Allergies  Allergen Reactions   Iodinated Contrast Media Other (See Comments)    Not indicated

## 2023-05-03 NOTE — Telephone Encounter (Signed)
Questionnaire in review

## 2023-05-23 NOTE — Telephone Encounter (Signed)
ASA 3. Needs ov.

## 2023-05-24 ENCOUNTER — Encounter: Payer: Self-pay | Admitting: *Deleted

## 2023-05-25 NOTE — Telephone Encounter (Signed)
Left patient a message to schedule OV before procedure

## 2023-05-31 ENCOUNTER — Ambulatory Visit: Payer: Medicare Other | Admitting: Internal Medicine

## 2023-06-13 NOTE — Telephone Encounter (Signed)
It is ok

## 2023-06-13 NOTE — Telephone Encounter (Signed)
Pt has dialysis and is only available on Mondays and Wednesdays. I accidentally put him in a follow up slot when he is a new patient. He is scheduled for next Wednesday at 230pm.

## 2023-06-13 NOTE — Telephone Encounter (Signed)
2pm not 230pm

## 2023-06-18 ENCOUNTER — Encounter: Payer: Self-pay | Admitting: Gastroenterology

## 2023-06-18 ENCOUNTER — Ambulatory Visit (INDEPENDENT_AMBULATORY_CARE_PROVIDER_SITE_OTHER): Payer: Medicare Other | Admitting: Gastroenterology

## 2023-06-18 VITALS — BP 109/66 | HR 102 | Temp 99.2°F | Ht 71.0 in | Wt 278.0 lb

## 2023-06-18 DIAGNOSIS — Z1211 Encounter for screening for malignant neoplasm of colon: Secondary | ICD-10-CM

## 2023-06-18 DIAGNOSIS — Z01818 Encounter for other preprocedural examination: Secondary | ICD-10-CM | POA: Diagnosis not present

## 2023-06-18 NOTE — H&P (Signed)
Chief Complaint: Decreased access flows  Assessment/Plan: 1)  ESRD initiated on dialysis 07/2021.   2)  Decreased access flows - on for an angiogram.   3)  HTN  - continue current medications   4)  OSA -  decreased respiratory reserve   5) DM type 2 - per primary team    6)  Anemia-  ESA as outpt   7) Acute on chronic diastolic CHF  -Given we have started dialysis we will switch Lasix to 80 mg twice daily as long as UOP is good -Management of fluid with dialysis at this time  8) Renal osteodystrophy - cont binders    HPI: [name] is an 57 y.o. male with a history of  hypertension, type 2 diabetes mellitus, chronic diastolic heart failure, ESRD presenting with decreased flows.   ROS Pertinent items are noted in HPI.  Chemistry and CBC: Creatinine, Ser  Date/Time Value Ref Range Status  11/21/2021 10:36 AM 7.40 (H) 0.61 - 1.24 mg/dL Final  63/07/6008 93:23 AM 4.90 (H) 0.61 - 1.24 mg/dL Final  55/73/2202 54:27 AM 6.31 (H) 0.61 - 1.24 mg/dL Final  01/14/7627 31:51 AM 5.89 (H) 0.61 - 1.24 mg/dL Final  76/16/0737 10:62 AM 4.61 (H) 0.61 - 1.24 mg/dL Final  69/48/5462 70:35 AM 5.37 (H) 0.61 - 1.24 mg/dL Final  00/93/8182 99:37 AM 4.38 (H) 0.61 - 1.24 mg/dL Final  16/96/7893 81:01 AM 5.62 (H) 0.61 - 1.24 mg/dL Final  75/04/2584 27:78 AM 5.15 (H) 0.61 - 1.24 mg/dL Final  24/23/5361 44:31 AM 6.55 (H) 0.61 - 1.24 mg/dL Final  54/00/8676 19:50 AM 6.12 (H) 0.61 - 1.24 mg/dL Final  93/26/7124 58:09 AM 6.93 (H) 0.61 - 1.24 mg/dL Final  98/33/8250 53:97 AM 6.31 (H) 0.61 - 1.24 mg/dL Final  67/34/1937 90:24 AM 5.90 (H) 0.61 - 1.24 mg/dL Final  09/73/5329 92:42 AM 5.81 (H) 0.61 - 1.24 mg/dL Final  68/34/1962 22:97 AM 5.51 (H) 0.61 - 1.24 mg/dL Final  98/92/1194 17:40 AM 5.25 (H) 0.61 - 1.24 mg/dL Final  81/44/8185 63:14 AM 4.91 (H) 0.61 - 1.24 mg/dL Final  97/08/6376 58:85 AM 4.31 (H) 0.61 - 1.24 mg/dL Final  02/77/4128 78:67 AM 4.30 (H) 0.61 - 1.24 mg/dL Final  67/20/9470  96:28 PM 4.15 (H) 0.61 - 1.24 mg/dL Final  36/62/9476 54:65 AM 4.08 (H) 0.61 - 1.24 mg/dL Final  03/54/6568 12:75 PM 4.18 (H) 0.61 - 1.24 mg/dL Final  17/00/1749 44:96 AM 3.74 (H) 0.61 - 1.24 mg/dL Final  75/91/6384 66:59 AM 3.97 (H) 0.61 - 1.24 mg/dL Final  93/57/0177 93:90 AM 5.16 (H) 0.61 - 1.24 mg/dL Final  30/03/2329 07:62 AM 4.96 (H) 0.61 - 1.24 mg/dL Final  26/33/3545 62:56 AM 4.86 (H) 0.61 - 1.24 mg/dL Final  38/93/7342 87:68 AM 4.99 (H) 0.61 - 1.24 mg/dL Final  11/57/2620 35:59 AM 4.46 (H) 0.61 - 1.24 mg/dL Final  74/16/3845 36:46 AM 3.59 (H) 0.61 - 1.24 mg/dL Final  80/32/1224 82:50 AM 3.36 (H) 0.61 - 1.24 mg/dL Final  03/70/4888 91:69 AM 3.31 (H) 0.61 - 1.24 mg/dL Final  45/09/8880 80:03 AM 3.07 (H) 0.61 - 1.24 mg/dL Final  49/17/9150 56:97 AM 4.20 (H) 0.61 - 1.24 mg/dL Final  94/80/1655 37:48 AM 4.06 (H) 0.61 - 1.24 mg/dL Final  27/01/8674 44:92 AM 4.40 (H) 0.61 - 1.24 mg/dL Final  01/00/7121 97:58 AM 2.76 (H) 0.61 - 1.24 mg/dL Final  83/25/4982 64:15 AM 2.65 (H) 0.61 - 1.24 mg/dL Final  83/03/4075 80:88 PM 2.78 (H) 0.61 -  1.24 mg/dL Final  16/04/9603 54:09 AM 2.30 (H) 0.61 - 1.24 mg/dL Final  81/19/1478 29:56 AM 2.93 (H) 0.61 - 1.24 mg/dL Final  21/30/8657 84:69 AM 2.74 (H) 0.61 - 1.24 mg/dL Final  62/95/2841 32:44 AM 2.86 (H) 0.61 - 1.24 mg/dL Final  07/26/7251 66:44 PM 2.90 (H) 0.61 - 1.24 mg/dL Final  03/47/4259 56:38 PM 1.95 (H) 0.61 - 1.24 mg/dL Final  75/64/3329 51:88 PM 2.43 (H) 0.61 - 1.24 mg/dL Final  41/66/0630 16:01 AM 2.10 (H) 0.61 - 1.24 mg/dL Final  09/32/3557 32:20 AM 2.27 (H) 0.61 - 1.24 mg/dL Final  25/42/7062 37:62 AM 2.26 (H) 0.61 - 1.24 mg/dL Final  83/15/1761 60:73 AM 2.16 (H) 0.61 - 1.24 mg/dL Final  71/12/2692 85:46 AM 2.13 (H) 0.61 - 1.24 mg/dL Final   No results for input(s): "NA", "K", "CL", "CO2", "GLUCOSE", "BUN", "CREATININE", "CALCIUM", "PHOS" in the last 168 hours.  Invalid input(s): "ALB" No results for input(s): "WBC", "NEUTROABS",  "HGB", "HCT", "MCV", "PLT" in the last 168 hours. Liver Function Tests: No results for input(s): "AST", "ALT", "ALKPHOS", "BILITOT", "PROT", "ALBUMIN" in the last 168 hours. No results for input(s): "LIPASE", "AMYLASE" in the last 168 hours. No results for input(s): "AMMONIA" in the last 168 hours. Cardiac Enzymes: No results for input(s): "CKTOTAL", "CKMB", "CKMBINDEX", "TROPONINI" in the last 168 hours. Iron Studies: No results for input(s): "IRON", "TIBC", "TRANSFERRIN", "FERRITIN" in the last 72 hours. PT/INR: @LABRCNTIP (inr:5)  Xrays/Other Studies: )No results found for this or any previous visit (from the past 48 hour(s)). No results found.  PMH:   Past Medical History:  Diagnosis Date   CHF (congestive heart failure) (HCC)    Diabetes mellitus    ESRD (end stage renal disease) (HCC)    Hypertension     PSH:   Past Surgical History:  Procedure Laterality Date   A/V FISTULAGRAM Right 11/21/2021   Procedure: A/V Fistulagram;  Surgeon: Maeola Harman, MD;  Location: [facility] INVASIVE CV LAB;  Service: Cardiovascular;  Laterality: Right;   AV FISTULA PLACEMENT Right 07/22/2021   Procedure: RIGHT  ARM FISTULA;  Surgeon: Nada Libman, MD;  Location: [facility] OR;  Service: Vascular;  Laterality: Right;   INCISION AND DRAINAGE     INSERTION OF DIALYSIS CATHETER  07/22/2021   Procedure: INSERTION OF DIALYSIS CATHETER;  Surgeon: Nada Libman, MD;  Location: [facility] OR;  Service: Vascular;;   KNEE SURGERY      Allergies:  Allergies  Allergen Reactions   Iodinated Contrast Media Other (See Comments)    Not indicated    Medications:   Prior to Admission medications   Medication Sig Start Date End Date Taking? Authorizing Provider  GARLIC PO Take 1 capsule by mouth daily.    [provider]  lanthanum (FOSRENOL) 1000 MG chewable tablet Chew 1,000 mg by mouth 3 (three) times daily. 06/30/22   [provider]  Magnesium 400 MG CAPS Take by mouth.    [provider]  Multiple Vitamin (MULTIVITAMIN WITH MINERALS) TABS tablet Take 1 tablet by mouth daily.    [provider]  pravastatin (PRAVACHOL) 80 MG tablet Take 1 tablet (80 mg total) by mouth every evening. 04/24/23   Billie Lade, MD  vitamin C (ASCORBIC ACID) 500 MG tablet Take 500 mg by mouth daily.    [provider]  Zinc 50 MG CAPS Take 50 mg by mouth every morning.    [provider]    Discontinued Meds:  There are no  discontinued medications.  Social History:  reports that he has never smoked. He has never used smokeless tobacco. He reports that he does not drink alcohol and does not use drugs.  Family History:   Family History  Problem Relation Age of Onset   Dementia Mother    Diabetes Father    Cancer Maternal Uncle    Colon cancer Neg Hx     There were no vitals taken for this visit. General: Supine in  bed, nad HEENT: NCAT Heart: normal rate Lungs: Bilateral chest rise, no increased work of breathing Abdomen: S, obese habitus Extremities: tr-1+ pitting edema bilateral lower extremities Skin: chronic venous stasis changes Neuro: alert and oriented x 3 provides hx and follows commands GU no foley Access: Rt CImino (+thrill)       Ethelene Hal, MD 06/18/2023, 3:18 PM

## 2023-06-18 NOTE — Progress Notes (Signed)
GI Office Note    Referring Provider: Billie Lade, MD Primary Care Physician:  Billie Lade, MD  Primary Gastroenterologist: Roetta Sessions, MD   Chief Complaint   Chief Complaint  Patient presents with   Colonoscopy    History of Present Illness   [name] is a 57 y.o. male presenting today for screening colonoscopy. Patient is needing colonoscopy as part of kidney transplant work up. Due to his multiple comorbities he was brought in for ov prior to scheduling.  Patient with history of end-stage renal disease on hemodialysis, hyperlipidemia, hypertension, diastolic heart failure, obstructive sleep apnea, type 2 diabetes mellitus.   He has been on dialysis for nearly two years. BM regular. No melena, brbpr. No abdominal pain. No n/v. Heartburn with certain foods. No dysphagia. No FH colon cancer.   Labs from March 2024: Lipase less than 4, iron 58, ferritin 99, TIBC 246, GGT 54, A1c 9.4, hemoglobin 11.2, MCV 85.6, platelets 332,000,   Medications   Current Outpatient Medications  Medication Sig Dispense Refill   GARLIC PO Take 1 capsule by mouth daily.     lanthanum (FOSRENOL) 1000 MG chewable tablet Chew 1,000 mg by mouth 3 (three) times daily.     Magnesium 400 MG CAPS Take by mouth.     Multiple Vitamin (MULTIVITAMIN WITH MINERALS) TABS tablet Take 1 tablet by mouth daily.     pravastatin (PRAVACHOL) 80 MG tablet Take 1 tablet (80 mg total) by mouth every evening. 90 tablet 3   vitamin C (ASCORBIC ACID) 500 MG tablet Take 500 mg by mouth daily.     Zinc 50 MG CAPS Take 50 mg by mouth every morning.     No current facility-administered medications for this visit.    Allergies   Allergies as of 06/18/2023 - Review Complete 06/18/2023  Allergen Reaction Noted   Iodinated contrast media Other (See Comments) 09/18/2022    Past Medical History   Past Medical History:  Diagnosis Date   CHF (congestive heart failure) (HCC)    Diabetes mellitus     ESRD (end stage renal disease) (HCC)    Hypertension     Past Surgical History   Past Surgical History:  Procedure Laterality Date   A/V FISTULAGRAM Right 11/21/2021   Procedure: A/V Fistulagram;  Surgeon: Maeola Harman, MD;  Location: [facility] INVASIVE CV LAB;  Service: Cardiovascular;  Laterality: Right;   AV FISTULA PLACEMENT Right 07/22/2021   Procedure: RIGHT  ARM FISTULA;  Surgeon: Nada Libman, MD;  Location: [facility] OR;  Service: Vascular;  Laterality: Right;   INCISION AND DRAINAGE     INSERTION OF DIALYSIS CATHETER  07/22/2021   Procedure: INSERTION OF DIALYSIS CATHETER;  Surgeon: Nada Libman, MD;  Location: [facility] OR;  Service: Vascular;;   KNEE SURGERY      Past Family History   Family History  Problem Relation Age of Onset   Dementia Mother    Diabetes Father    Cancer Maternal Uncle    Colon cancer Neg Hx     Past Social History   Social History   Socioeconomic History   Marital status: Married    Spouse name: Not on file   Number of children: Not on file   Years of education: Not on file   Highest education level: Not on file  Occupational History   Not on file  Tobacco Use   Smoking status: Never   Smokeless tobacco: Never  Vaping Use  Vaping status: Never Used  Substance and Sexual Activity   Alcohol use: No   Drug use: No   Sexual activity: Not on file  Other Topics Concern   Not on file  Social History Narrative   Not on file   Social Determinants of Health   Financial Resource Strain: High Risk (11/29/2020)   Overall Financial Resource Strain (CARDIA)    Difficulty of Paying Living Expenses: Hard  Food Insecurity: Food Insecurity Present (12/29/2020)   Hunger Vital Sign    Worried About Running Out of Food in the Last Year: Sometimes true    Ran Out of Food in the Last Year: Never true  Transportation Needs: No Transportation Needs (11/29/2020)   PRAPARE - Administrator, Civil Service (Medical): No    Lack of  Transportation (Non-Medical): No  Physical Activity: Not on file  Stress: Not on file  Social Connections: Not on file  Intimate Partner Violence: Not on file    Review of Systems   General: Negative for anorexia, weight loss, fever, chills, fatigue, weakness. Eyes: Negative for vision changes.  ENT: Negative for hoarseness, difficulty swallowing , nasal congestion. CV: Negative for chest pain, angina, palpitations, dyspnea on exertion, peripheral edema.  Respiratory: Negative for dyspnea at rest, dyspnea on exertion, cough, sputum, wheezing.  GI: See history of present illness. GU:  Negative for dysuria, hematuria, urinary incontinence, urinary frequency, nocturnal urination.  MS: Negative for joint pain, low back pain.  Derm: Negative for rash or itching.  Neuro: Negative for weakness, abnormal sensation, seizure, frequent headaches, memory loss,  confusion.  Psych: Negative for anxiety, depression, suicidal ideation, hallucinations.  Endo: Negative for unusual weight change.  Heme: Negative for bruising or bleeding. Allergy: Negative for rash or hives.  Physical Exam   BP 109/66 (BP Location: Right Arm, Patient Position: Sitting, Cuff Size: Large)   Pulse (!) 102   Temp 99.2 F (37.3 C) (Oral)   Ht 5\' 11"  (1.803 m)   Wt 278 lb (126.1 kg)   SpO2 97%   BMI 38.77 kg/m    General: Well-nourished, well-developed in no acute distress.  Head: Normocephalic, atraumatic.   Eyes: Conjunctiva pink, no icterus. Mouth: Oropharyngeal mucosa moist and pink  Neck: Supple without thyromegaly, masses, or lymphadenopathy.  Lungs: Clear to auscultation bilaterally.  Heart: Regular rate and rhythm, no murmurs rubs or gallops.  Abdomen: Bowel sounds are normal, nontender, nondistended, no hepatosplenomegaly or masses,  no abdominal bruits or hernia, no rebound or guarding.   Rectal: not performed Extremities: No lower extremity edema. No clubbing or deformities.  Neuro: Alert and  oriented x 4 , grossly normal neurologically.  Skin: Warm and dry, no rash or jaundice.   Psych: Alert and cooperative, normal mood and affect.  Labs   See hpi  Imaging Studies   No results found.  Assessment/Plan:   Colon cancer screening: -needs colonoscopy as part of kidney transplant evaluation. Denies GI symptoms. No FH CRC. -colonoscopy in near future. ASA 3.  I have discussed the risks, alternatives, benefits with regards to but not limited to the risk of reaction to medication, bleeding, infection, perforation and the patient is agreeable to proceed. Written consent to be obtained.   Leanna Battles. Melvyn Neth, MHS, PA-C [facility]

## 2023-06-18 NOTE — Patient Instructions (Signed)
Colonoscopy to be scheduled.

## 2023-06-19 ENCOUNTER — Encounter (HOSPITAL_COMMUNITY): Payer: Self-pay | Admitting: Vascular Surgery

## 2023-06-19 ENCOUNTER — Encounter (HOSPITAL_COMMUNITY)
Admission: RE | Disposition: A | Discharge status: Home / Self Care | Payer: Self-pay | Source: Home / Self Care | Attending: Nephrology

## 2023-06-19 ENCOUNTER — Ambulatory Visit (HOSPITAL_COMMUNITY)
Admission: RE | Admit: 2023-06-19 | Discharge: 2023-06-19 | Disposition: A | Discharge status: Home / Self Care | Payer: Medicare Other | Attending: Nephrology | Admitting: Nephrology

## 2023-06-19 ENCOUNTER — Other Ambulatory Visit: Payer: Self-pay

## 2023-06-19 DIAGNOSIS — Z79899 Other long term (current) drug therapy: Secondary | ICD-10-CM | POA: Diagnosis not present

## 2023-06-19 DIAGNOSIS — E1122 Type 2 diabetes mellitus with diabetic chronic kidney disease: Secondary | ICD-10-CM | POA: Diagnosis not present

## 2023-06-19 DIAGNOSIS — I5033 Acute on chronic diastolic (congestive) heart failure: Secondary | ICD-10-CM | POA: Diagnosis not present

## 2023-06-19 DIAGNOSIS — G4733 Obstructive sleep apnea (adult) (pediatric): Secondary | ICD-10-CM | POA: Insufficient documentation

## 2023-06-19 DIAGNOSIS — I132 Hypertensive heart and chronic kidney disease with heart failure and with stage 5 chronic kidney disease, or end stage renal disease: Secondary | ICD-10-CM | POA: Diagnosis not present

## 2023-06-19 DIAGNOSIS — Z992 Dependence on renal dialysis: Secondary | ICD-10-CM | POA: Insufficient documentation

## 2023-06-19 DIAGNOSIS — N186 End stage renal disease: Secondary | ICD-10-CM | POA: Insufficient documentation

## 2023-06-19 DIAGNOSIS — T82858A Stenosis of vascular prosthetic devices, implants and grafts, initial encounter: Secondary | ICD-10-CM | POA: Diagnosis present

## 2023-06-19 DIAGNOSIS — Z9889 Other specified postprocedural states: Secondary | ICD-10-CM | POA: Diagnosis not present

## 2023-06-19 DIAGNOSIS — D631 Anemia in chronic kidney disease: Secondary | ICD-10-CM | POA: Diagnosis not present

## 2023-06-19 DIAGNOSIS — N250 Renal osteodystrophy: Secondary | ICD-10-CM | POA: Insufficient documentation

## 2023-06-19 DIAGNOSIS — Y841 Kidney dialysis as the cause of abnormal reaction of the patient, or of later complication, without mention of misadventure at the time of the procedure: Secondary | ICD-10-CM | POA: Insufficient documentation

## 2023-06-19 HISTORY — PX: A/V FISTULAGRAM: CATH118298

## 2023-06-19 HISTORY — PX: PERIPHERAL VASCULAR BALLOON ANGIOPLASTY: CATH118281

## 2023-06-19 LAB — GLUCOSE, CAPILLARY: Glucose-Capillary: 168 mg/dL — ABNORMAL HIGH (ref 70–99)

## 2023-06-19 SURGERY — A/V FISTULAGRAM
Anesthesia: LOCAL

## 2023-06-19 MED ORDER — MIDAZOLAM HCL 2 MG/2ML IJ SOLN
INTRAMUSCULAR | Status: AC
Start: 2023-06-19 — End: ?
  Filled 2023-06-19: qty 2

## 2023-06-19 MED ORDER — LIDOCAINE HCL (PF) 1 % IJ SOLN
INTRAMUSCULAR | Status: DC | PRN
  Administered 2023-06-19: 2 mL via INTRADERMAL

## 2023-06-19 MED ORDER — MIDAZOLAM HCL 2 MG/2ML IJ SOLN
INTRAMUSCULAR | Status: DC | PRN
  Administered 2023-06-19: 1 mg via INTRAVENOUS

## 2023-06-19 MED ORDER — FENTANYL CITRATE (PF) 100 MCG/2ML IJ SOLN
INTRAMUSCULAR | Status: AC
  Filled 2023-06-19: qty 2

## 2023-06-19 MED ORDER — ACETAMINOPHEN 325 MG PO TABS: 650.0000 mg | ORAL_TABLET | ORAL | Status: DC | PRN

## 2023-06-19 MED ORDER — HEPARIN (PORCINE) IN NACL 1000-0.9 UT/500ML-% IV SOLN
INTRAVENOUS | Status: DC | PRN
  Administered 2023-06-19: 500 mL

## 2023-06-19 MED ORDER — LIDOCAINE HCL (PF) 1 % IJ SOLN
INTRAMUSCULAR | Status: AC
  Filled 2023-06-19: qty 30

## 2023-06-19 MED ORDER — FENTANYL CITRATE (PF) 100 MCG/2ML IJ SOLN
INTRAMUSCULAR | Status: DC | PRN
  Administered 2023-06-19: 25 ug via INTRAVENOUS

## 2023-06-19 MED ORDER — IOPAMIDOL (ISOVUE-370) INJECTION 76%
INTRAVENOUS | Status: DC | PRN
  Administered 2023-06-19: 20 mL via INTRA_ARTERIAL

## 2023-06-19 MED ORDER — SODIUM CHLORIDE 0.9% FLUSH: 10.0000 mL | Freq: Two times a day (BID) | INTRAVENOUS | Status: DC

## 2023-06-19 MED ORDER — ONDANSETRON HCL 4 MG/2ML IJ SOLN: 4.0000 mg | Freq: Four times a day (QID) | INTRAMUSCULAR | Status: DC | PRN

## 2023-06-19 SURGICAL SUPPLY — 10 items
BALLN MUSTANG 7.0X40 75 (BALLOONS) ×2
BALLOON MUSTANG 7.0X40 75 (BALLOONS) IMPLANT
CATH SLIP KMP 65CM 5FR (CATHETERS) IMPLANT
COVER DOME SNAP 22 D (MISCELLANEOUS) ×2 IMPLANT
GUIDEWIRE ANGLED .035X150CM (WIRE) IMPLANT
SHEATH PINNACLE R/O II 6F 4CM (SHEATH) IMPLANT
SHEATH PROBE COVER 6X72 (BAG) ×2 IMPLANT
SYR MEDALLION 10ML (SYRINGE) IMPLANT
TRAY PV CATH (CUSTOM PROCEDURE TRAY) ×2 IMPLANT
WIRE BENTSON .035X145CM (WIRE) IMPLANT

## 2023-06-19 NOTE — Op Note (Signed)
    Patient name: [name] MRN: [id number] DOB: 03-Nov-1965 Sex: male  06/19/2023 Pre-operative Diagnosis: ESRD on HD Post-operative diagnosis:  Same Surgeon:  Daria Pastures, MD Procedure Performed:  Ultrasound-guided access of right upper extremity radiocephalic AV fistula in retrograde fashion Fistulogram and central venogram Cannulation of right radial artery Balloon angioplasty of anastomosis with 7 x 40 mm Mustang balloon 17 minutes moderate sedation   Indications: 57 year old male with ESRD on HD with decreased flows during dialysis.  Previously underwent balloon angioplasty of his anastomosis on 7/724.  It was confirmed that he does not have any contrast allergies although it was on his list.  Recent benefits of angiogram with intervention were reviewed and he was willing to proceed.  Findings: Approximately 70% stenosis of the anastomosis and proximal portion of the radiocephalic fistula, widely patent venous outflow and central venous system.   Procedure:  The patient was identified in the holding area and taken to the cath lab  The patient was then placed supine on the table and prepped and draped in the usual sterile fashion.  A time out was called.  Ultrasound was used to evaluate the right arm AV access. This was accessed under u/s guidance in a retrograde fashion, towards the anastomosis.  Fistulogram was obtained which demonstrated the above findings.  The anastomosis was then crossed with an 035 soft angled Glidewire and KMP catheter.  Arteriogram from the arterial inflow confirmed to the anastomotic and proximal fistula stenosis.  This was treated with a 7 x 40 mm Mustang balloon.  Completion angiography demonstrated a sufficient response with less than 30% residual stenosis and brisk flow through the fistula.  The wire and sheath was removed and the access site was closed with a 3-0 nylon.  Contrast: 20  Sedation: 17 minutes  Impression: Sufficient response to  balloon angioplasty of the inflow.  Brisk flow through the fistula on completion   Daria Pastures MD [facility] Office: [phone]

## 2023-06-19 NOTE — Discharge Instructions (Addendum)
General care instructions: - Do not drive or operate heavy machinery for 24hrs - Avoid making any important decisions for the remainder of the day. - You should be able to eat, drink, and resume your normal medications. - Avoid any strenuous activity for the remainder of the day. Potential complications: - Your hand is more cold or numb than usual. - You are bleeding at the site and it will not stop with direct pressure. If it was a declot expect some oozing at the site. Avoid extreme pressure to the site. - You have a change in the bruit and /or thrill in your fistula or graft. - You have a fever, swelling, see redness or feel heat at or near the puncture site. Medication instructions: - Continue routine medications unless otherwise instructed. 4. Please have your suture removed at your next scheduled dialysis treatment.

## 2023-06-25 ENCOUNTER — Encounter: Payer: Self-pay | Admitting: Cardiology

## 2023-06-25 ENCOUNTER — Ambulatory Visit: Payer: Medicare Other | Attending: Cardiology | Admitting: Cardiology

## 2023-06-25 NOTE — Progress Notes (Unsigned)
Clinical Summary [name] is a 57 y.o.male  seen today for follow up of the following medical problems.    1.Chronic diastolic HF - 03/2020 Echocardiogram shows a low-normal EF of 50% with no regional WMA. Noted to have severe LVH and Grade 2 DD.   -followed in HF clinic - myeloma panel negative, has not been able to get pyp scan due to insurance or a sleep study - he is on torsemide 80mg  bid, metolazone 2.5mg  once weekly though recently metolazone has been on hold - last Cr 4.2, uptrend from 2.7 2 months ago. - notes mention weight had been up to 290 lbs, down to 276 today. Home weight down to 272 lbs.  - some ongoing swelling in legs, though much imprved. SOB improving. No orthopnea.    - now on HD for fluid management. Still makes some urine.  - no recent edema, no SOB/DOE.     PYP 12/22 equivocal for TTR amyloid, myeloma panel negative, gene testing for hATTR negative.   - repeat PYP 01/2022 Visual and quantitative assessment (grade 1, H/CL equal 1.0) are NOT suggestive of transthyretin amyloidosis.     - no recent edema, fluid is well controlled with HD. Some DOE with higher levels of activity which is chronic  09/2022 echo: LVEF 55-60%, no WMAs, grade I dd   2. HTN   - reports low bp's during HD at times.  - reports kidney doctor had stopped labetalol and hydralazine       3. ESRD - followed by nephrology Dr Wolfgang Phoenix - from last note planning for vacsular consult to consider access   -occasional low bp's during HD.    4. Hyperlipidemia - 03/2022 TC 194 TG 103 HDL 49 LDL 124 - based on this panel pravastaqtin increased to 80mg  daily - due for repeat lipids and LFTs Past Medical History:  Diagnosis Date   CHF (congestive heart failure) (HCC)    Diabetes mellitus    ESRD (end stage renal disease) (HCC)    Hypertension      No Active Allergies   Current Outpatient Medications  Medication Sig Dispense Refill   GARLIC PO Take 1 capsule by mouth daily.      lanthanum (FOSRENOL) 1000 MG chewable tablet Chew 1,000 mg by mouth 3 (three) times daily.     Magnesium 400 MG CAPS Take by mouth.     Multiple Vitamin (MULTIVITAMIN WITH MINERALS) TABS tablet Take 1 tablet by mouth daily.     pravastatin (PRAVACHOL) 80 MG tablet Take 1 tablet (80 mg total) by mouth every evening. 90 tablet 3   vitamin C (ASCORBIC ACID) 500 MG tablet Take 500 mg by mouth daily.     Zinc 50 MG CAPS Take 50 mg by mouth every morning.     No current facility-administered medications for this visit.     Past Surgical History:  Procedure Laterality Date   A/V FISTULAGRAM Right 11/21/2021   Procedure: A/V Fistulagram;  Surgeon: Maeola Harman, MD;  Location: [facility] INVASIVE CV LAB;  Service: Cardiovascular;  Laterality: Right;   A/V FISTULAGRAM N/A 06/19/2023   Procedure: A/V Fistulagram;  Surgeon: Daria Pastures, MD;  Location: [facility] INVASIVE CV LAB;  Service: Vascular;  Laterality: N/A;   AV FISTULA PLACEMENT Right 07/22/2021   Procedure: RIGHT  ARM FISTULA;  Surgeon: Nada Libman, MD;  Location: [facility] OR;  Service: Vascular;  Laterality: Right;   INCISION AND DRAINAGE     INSERTION OF  DIALYSIS CATHETER  07/22/2021   Procedure: INSERTION OF DIALYSIS CATHETER;  Surgeon: Nada Libman, MD;  Location: [facility] OR;  Service: Vascular;;   KNEE SURGERY     PERIPHERAL VASCULAR BALLOON ANGIOPLASTY  06/19/2023   Procedure: PERIPHERAL VASCULAR BALLOON ANGIOPLASTY;  Surgeon: Daria Pastures, MD;  Location: [facility] INVASIVE CV LAB;  Service: Vascular;;     No Active Allergies    Family History  Problem Relation Age of Onset   Dementia Mother    Diabetes Father    Cancer Maternal Uncle    Colon cancer Neg Hx      Social History [name] reports that he has never smoked. He has never used smokeless tobacco. [name] reports no history of alcohol use.   Review of Systems CONSTITUTIONAL: No weight loss, fever, chills, weakness or fatigue.  HEENT: Eyes: No visual loss,  blurred vision, double vision or yellow sclerae.No hearing loss, sneezing, congestion, runny nose or sore throat.  SKIN: No rash or itching.  CARDIOVASCULAR:  RESPIRATORY: No shortness of breath, cough or sputum.  GASTROINTESTINAL: No anorexia, nausea, vomiting or diarrhea. No abdominal pain or blood.  GENITOURINARY: No burning on urination, no polyuria NEUROLOGICAL: No headache, dizziness, syncope, paralysis, ataxia, numbness or tingling in the extremities. No change in bowel or bladder control.  MUSCULOSKELETAL: No muscle, back pain, joint pain or stiffness.  LYMPHATICS: No enlarged nodes. No history of splenectomy.  PSYCHIATRIC: No history of depression or anxiety.  ENDOCRINOLOGIC: No reports of sweating, cold or heat intolerance. No polyuria or polydipsia.  [name]   Physical Examination There were no vitals filed for this visit. There were no vitals filed for this visit.  Gen: resting comfortably, no acute distress HEENT: no scleral icterus, pupils equal round and reactive, no palptable cervical adenopathy,  CV Resp: Clear to auscultation bilaterally GI: abdomen is soft, non-tender, non-distended, normal bowel sounds, no hepatosplenomegaly MSK: extremities are warm, no edema.  Skin: warm, no rash Neuro:  no focal deficits Psych: appropriate affect   Diagnostic Studies 09/2022 echo 1. Left ventricular ejection fraction, by estimation, is 55 to 60%. The  left ventricle has normal function. Left ventricular endocardial border  not optimally defined to evaluate regional wall motion. Left ventricular  diastolic parameters are consistent  with Grade I diastolic dysfunction (impaired relaxation).   2. Right ventricular systolic function is normal. The right ventricular  size is normal. Tricuspid regurgitation signal is inadequate for assessing  PA pressure.   3. The mitral valve is normal in structure. Trivial mitral valve  regurgitation.   4. The aortic valve is tricuspid. Aortic  valve regurgitation is not  visualized. Aortic valve sclerosis is present, with no evidence of aortic  valve stenosis.   5. The inferior vena cava is normal in size with greater than 50%  respiratory variability, suggesting right atrial pressure of 3 mmHg.     Assessment and Plan  Chronic diastolic HF - fluid management per dialysis - he is euvolemic today, continue HD per renal   2. HTN -much improved after starting HD, renal has actually stopped his labetalol and hdyralazine. Can have some low bp's during HD at times - acceptable bp give issues during HD, continue to monitor   3. Hyperlipidemia - repeat lipid panel, pravastatin was recnetly in creased to 80mg  daily      Antoine Poche, M.D., F.A.C.C.

## 2023-06-28 ENCOUNTER — Telehealth: Payer: Self-pay | Admitting: *Deleted

## 2023-06-28 MED ORDER — PEG 3350-KCL-NA BICARB-NACL 420 G PO SOLR: 4000.0000 mL | Freq: Once | ORAL | 0 refills | Status: AC

## 2023-06-28 NOTE — Telephone Encounter (Signed)
Called pt. Scheduled for TCS with Dr. Jena Gauss, ASA 3 on 1/29. Pt has dialysis on TUES, THURS, SAT. He is aware will mail instructions. Confirmed address and pharmacy are correct. Will call back with pre-op appt. He will be changing insurance to [place] next year. He is aware will need copy of card after 07/25/23. He will have it brought to [place].

## 2023-06-29 NOTE — Telephone Encounter (Signed)
Called, no answer. Letter mailed with pre-op appt/.

## 2023-07-26 DIAGNOSIS — N186 End stage renal disease: Secondary | ICD-10-CM | POA: Diagnosis not present

## 2023-07-26 DIAGNOSIS — Z992 Dependence on renal dialysis: Secondary | ICD-10-CM | POA: Diagnosis not present

## 2023-07-26 DIAGNOSIS — D509 Iron deficiency anemia, unspecified: Secondary | ICD-10-CM | POA: Diagnosis not present

## 2023-07-26 DIAGNOSIS — N2581 Secondary hyperparathyroidism of renal origin: Secondary | ICD-10-CM | POA: Diagnosis not present

## 2023-07-26 DIAGNOSIS — E1122 Type 2 diabetes mellitus with diabetic chronic kidney disease: Secondary | ICD-10-CM | POA: Diagnosis not present

## 2023-07-26 DIAGNOSIS — D631 Anemia in chronic kidney disease: Secondary | ICD-10-CM | POA: Diagnosis not present

## 2023-07-26 DIAGNOSIS — D689 Coagulation defect, unspecified: Secondary | ICD-10-CM | POA: Diagnosis not present

## 2023-07-27 ENCOUNTER — Ambulatory Visit (INDEPENDENT_AMBULATORY_CARE_PROVIDER_SITE_OTHER): Payer: Medicare Other | Admitting: Internal Medicine

## 2023-07-27 ENCOUNTER — Encounter: Payer: Self-pay | Admitting: Internal Medicine

## 2023-07-27 VITALS — BP 136/80 | HR 88 | Ht 71.0 in | Wt 285.0 lb

## 2023-07-27 DIAGNOSIS — E785 Hyperlipidemia, unspecified: Secondary | ICD-10-CM | POA: Diagnosis not present

## 2023-07-27 DIAGNOSIS — I5032 Chronic diastolic (congestive) heart failure: Secondary | ICD-10-CM

## 2023-07-27 DIAGNOSIS — I10 Essential (primary) hypertension: Secondary | ICD-10-CM | POA: Diagnosis not present

## 2023-07-27 DIAGNOSIS — N186 End stage renal disease: Secondary | ICD-10-CM

## 2023-07-27 DIAGNOSIS — Z992 Dependence on renal dialysis: Secondary | ICD-10-CM

## 2023-07-27 DIAGNOSIS — E66812 Obesity, class 2: Secondary | ICD-10-CM | POA: Diagnosis not present

## 2023-07-27 DIAGNOSIS — E1121 Type 2 diabetes mellitus with diabetic nephropathy: Secondary | ICD-10-CM

## 2023-07-27 DIAGNOSIS — E1169 Type 2 diabetes mellitus with other specified complication: Secondary | ICD-10-CM

## 2023-07-27 NOTE — Assessment & Plan Note (Signed)
 Remains adequately controlled off antihypertensive medication

## 2023-07-27 NOTE — Assessment & Plan Note (Signed)
 Currently prescribed pravastatin 80 mg daily.  LDL 159 on labs from March.  Repeat lipid panel ordered today.

## 2023-07-27 NOTE — Patient Instructions (Signed)
 It was a pleasure to see you today.  Thank you for giving [place] the opportunity to be involved in your care.  Below is a brief recap of your visit and next steps.  We will plan to see you again in 3 months.  Summary No medication changes today Repeat labs ordered Follow up in 3 months

## 2023-07-27 NOTE — Assessment & Plan Note (Signed)
 ESRD on HD Tuesday, Thursday, and Saturday. Followed by transplant nephrology at [facility]. He will undergo colonoscopy later this month (1/29) as part of his transplant evaluation in addition to colon cancer screening.

## 2023-07-27 NOTE — Progress Notes (Signed)
 Established Patient Office Visit  Subjective   Patient ID: [name], male    DOB: 1966/05/12  Age: 58 y.o. MRN: [id number]  Chief Complaint  Patient presents with   Follow-up    Three month follow up    [name] returns to care today for routine follow-up.  He was last evaluated by me as a new patient presenting to establish care on 10/1.  No medication changes were made and he was referred to gastroenterology for screening colonoscopy.  In the interim, he was evaluated by gastroenterology and is scheduled for colonoscopy later this month (1/29).  He also underwent angiogram with balloon angioplasty of the anastomosis of his right upper extremity AV fistula.  There have otherwise been no acute interval events.  [name] reports feeling well today.  He is asymptomatic and has no acute concerns to discuss.  Past Medical History:  Diagnosis Date   CHF (congestive heart failure) (HCC)    Diabetes mellitus    ESRD (end stage renal disease) (HCC)    Hypertension    Past Surgical History:  Procedure Laterality Date   A/V FISTULAGRAM Right 11/21/2021   Procedure: A/V Fistulagram;  Surgeon: Sheree Penne Bruckner, MD;  Location: [facility] INVASIVE CV LAB;  Service: Cardiovascular;  Laterality: Right;   A/V FISTULAGRAM N/A 06/19/2023   Procedure: A/V Fistulagram;  Surgeon: Pearline Norman RAMAN, MD;  Location: [facility] INVASIVE CV LAB;  Service: Vascular;  Laterality: N/A;   AV FISTULA PLACEMENT Right 07/22/2021   Procedure: RIGHT  ARM FISTULA;  Surgeon: Serene Gaile ORN, MD;  Location: [facility] OR;  Service: Vascular;  Laterality: Right;   INCISION AND DRAINAGE     INSERTION OF DIALYSIS CATHETER  07/22/2021   Procedure: INSERTION OF DIALYSIS CATHETER;  Surgeon: Serene Gaile ORN, MD;  Location: [facility] OR;  Service: Vascular;;   KNEE SURGERY     PERIPHERAL VASCULAR BALLOON ANGIOPLASTY  06/19/2023   Procedure: PERIPHERAL VASCULAR BALLOON ANGIOPLASTY;  Surgeon: Pearline Norman RAMAN, MD;  Location: [facility] INVASIVE CV  LAB;  Service: Vascular;;   Social History   Tobacco Use   Smoking status: Never   Smokeless tobacco: Never  Vaping Use   Vaping status: Never Used  Substance Use Topics   Alcohol use: No   Drug use: No   Family History  Problem Relation Age of Onset   Dementia Mother    Diabetes Father    Cancer Maternal Uncle    Colon cancer Neg Hx    No Active Allergies  Review of Systems  Constitutional:  Negative for chills and fever.  HENT:  Negative for sore throat.   Respiratory:  Negative for cough and shortness of breath.   Cardiovascular:  Negative for chest pain, palpitations and leg swelling.  Gastrointestinal:  Negative for abdominal pain, blood in stool, constipation, diarrhea, nausea and vomiting.  Genitourinary:  Negative for dysuria and hematuria.  Musculoskeletal:  Negative for myalgias.  Skin:  Negative for itching and rash.  Neurological:  Negative for dizziness and headaches.  Psychiatric/Behavioral:  Negative for depression and suicidal ideas.      Objective:     BP 136/80   Pulse 88   Ht 5' 11 (1.803 m)   Wt 285 lb (129.3 kg)   SpO2 93%   BMI 39.75 kg/m  BP Readings from Last 3 Encounters:  07/27/23 136/80  06/19/23 (!) 145/81  06/18/23 109/66   Physical Exam Vitals reviewed.  Constitutional:      General: He is not  in acute distress.    Appearance: Normal appearance. He is obese. He is not ill-appearing.  HENT:     Head: Normocephalic and atraumatic.     Right Ear: External ear normal.     Left Ear: External ear normal.     Nose: Nose normal. No congestion or rhinorrhea.     Mouth/Throat:     Mouth: Mucous membranes are moist.     Pharynx: Oropharynx is clear.  Eyes:     General: No scleral icterus.    Extraocular Movements: Extraocular movements intact.     Conjunctiva/sclera: Conjunctivae normal.     Pupils: Pupils are equal, round, and reactive to light.  Cardiovascular:     Rate and Rhythm: Normal rate and regular rhythm.     Pulses:  Normal pulses.     Heart sounds: Normal heart sounds. No murmur heard. Pulmonary:     Effort: Pulmonary effort is normal.     Breath sounds: Normal breath sounds. No wheezing, rhonchi or rales.  Abdominal:     General: Abdomen is flat. Bowel sounds are normal. There is no distension.     Palpations: Abdomen is soft.     Tenderness: There is no abdominal tenderness.  Musculoskeletal:        General: No swelling or deformity. Normal range of motion.     Cervical back: Normal range of motion.     Comments: Right forearm AV fistula with a palpable pulse and audible bruit.  Skin:    General: Skin is warm and dry.     Capillary Refill: Capillary refill takes less than 2 seconds.  Neurological:     General: No focal deficit present.     Mental Status: He is alert and oriented to person, place, and time.     Motor: No weakness.  Psychiatric:        Mood and Affect: Mood normal.        Behavior: Behavior normal.        Thought Content: Thought content normal.   Last CBC Lab Results  Component Value Date   WBC 6.1 08/01/2021   HGB 11.2 (L) 11/21/2021   HCT 33.0 (L) 11/21/2021   MCV 82.9 08/01/2021   MCH 27.2 08/01/2021   RDW 14.6 08/01/2021   PLT 320 08/01/2021   Last metabolic panel Lab Results  Component Value Date   GLUCOSE 191 (H) 11/21/2021   NA 137 11/21/2021   K 4.6 11/21/2021   CL 97 (L) 11/21/2021   CO2 25 08/02/2021   BUN 66 (H) 11/21/2021   CREATININE 7.40 (H) 11/21/2021   GFRNONAA 13 (L) 08/02/2021   CALCIUM  8.5 (L) 08/02/2021   PHOS 3.8 08/02/2021   PROT 7.3 07/05/2022   ALBUMIN 3.3 (L) 07/05/2022   LABGLOB 2.9 07/17/2021   AGRATIO 1.0 12/29/2020   BILITOT 0.3 07/05/2022   ALKPHOS 100 07/05/2022   AST 14 (L) 07/05/2022   ALT 13 07/05/2022   ANIONGAP 10 08/02/2021   Last lipids Lab Results  Component Value Date   CHOL 170 07/05/2022   HDL 51 07/05/2022   LDLCALC 70 07/05/2022   TRIG 244 (H) 07/05/2022   CHOLHDL 3.3 07/05/2022   Last hemoglobin  A1c Lab Results  Component Value Date   HGBA1C 7.1 (H) 07/15/2021   Last thyroid  functions Lab Results  Component Value Date   TSH 1.950 07/16/2021   Last vitamin D  Lab Results  Component Value Date   VD25OH 15.02 (L) 06/08/2021   Last vitamin B12  and Folate Lab Results  Component Value Date   VITAMINB12 559 09/06/2020   The 10-year ASCVD risk score (Arnett DK, et al., 2019) is: 16.4%    Assessment & Plan:   Problem List Items Addressed This Visit       Essential hypertension - Primary   Remains adequately controlled off antihypertensive medication.      Type 2 diabetes with nephropathy (HCC)   Not currently prescribed any medication for treatment of diabetes mellitus.  A1c 9.4 on labs from March 2024.  Repeat A1c ordered today.  We discussed starting a GLP-1 if A1c remains above goal.       Hyperlipidemia associated with type 2 diabetes mellitus (HCC)   Currently prescribed pravastatin  80 mg daily.  LDL 159 on labs from March.  Repeat lipid panel ordered today.      ESRD (end stage renal disease) on dialysis [facility])   ESRD on HD Tuesday, Thursday, and Saturday. Followed by transplant nephrology at [facility]. He will undergo colonoscopy later this month (1/29) as part of his transplant evaluation in addition to colon cancer screening.        Return in about 3 months (around 10/25/2023).    Manus FORBES Fireman, MD

## 2023-07-27 NOTE — Assessment & Plan Note (Signed)
 Not currently prescribed any medication for treatment of diabetes mellitus.  A1c 9.4 on labs from March 2024.  Repeat A1c ordered today.  We discussed starting a GLP-1 if A1c remains above goal.

## 2023-07-28 DIAGNOSIS — D509 Iron deficiency anemia, unspecified: Secondary | ICD-10-CM | POA: Diagnosis not present

## 2023-07-28 DIAGNOSIS — N2581 Secondary hyperparathyroidism of renal origin: Secondary | ICD-10-CM | POA: Diagnosis not present

## 2023-07-28 DIAGNOSIS — D689 Coagulation defect, unspecified: Secondary | ICD-10-CM | POA: Diagnosis not present

## 2023-07-28 DIAGNOSIS — E1122 Type 2 diabetes mellitus with diabetic chronic kidney disease: Secondary | ICD-10-CM | POA: Diagnosis not present

## 2023-07-28 DIAGNOSIS — Z992 Dependence on renal dialysis: Secondary | ICD-10-CM | POA: Diagnosis not present

## 2023-07-28 DIAGNOSIS — D631 Anemia in chronic kidney disease: Secondary | ICD-10-CM | POA: Diagnosis not present

## 2023-07-28 DIAGNOSIS — N186 End stage renal disease: Secondary | ICD-10-CM | POA: Diagnosis not present

## 2023-07-28 LAB — CBC WITH DIFFERENTIAL/PLATELET
Basophils Absolute: 0 10*3/uL (ref 0.0–0.2)
Basos: 1 %
EOS (ABSOLUTE): 0.1 10*3/uL (ref 0.0–0.4)
Eos: 2 %
Hematocrit: 35.3 % — ABNORMAL LOW (ref 37.5–51.0)
Hemoglobin: 11.5 g/dL — ABNORMAL LOW (ref 13.0–17.7)
Immature Grans (Abs): 0 10*3/uL (ref 0.0–0.1)
Immature Granulocytes: 0 %
Lymphocytes Absolute: 1.5 10*3/uL (ref 0.7–3.1)
Lymphs: 22 %
MCH: 28.3 pg (ref 26.6–33.0)
MCHC: 32.6 g/dL (ref 31.5–35.7)
MCV: 87 fL (ref 79–97)
Monocytes Absolute: 0.7 10*3/uL (ref 0.1–0.9)
Monocytes: 10 %
Neutrophils Absolute: 4.3 10*3/uL (ref 1.4–7.0)
Neutrophils: 65 %
Platelets: 269 10*3/uL (ref 150–450)
RBC: 4.06 x10E6/uL — ABNORMAL LOW (ref 4.14–5.80)
RDW: 14.2 % (ref 11.6–15.4)
WBC: 6.7 10*3/uL (ref 3.4–10.8)

## 2023-07-28 LAB — CMP14+EGFR
ALT: 13 [IU]/L (ref 0–44)
AST: 12 [IU]/L (ref 0–40)
Albumin: 4.1 g/dL (ref 3.8–4.9)
Alkaline Phosphatase: 90 [IU]/L (ref 44–121)
BUN/Creatinine Ratio: 7 — ABNORMAL LOW (ref 9–20)
BUN: 60 mg/dL — ABNORMAL HIGH (ref 6–24)
Bilirubin Total: 0.2 mg/dL (ref 0.0–1.2)
CO2: 29 mmol/L (ref 20–29)
Calcium: 9.4 mg/dL (ref 8.7–10.2)
Chloride: 94 mmol/L — ABNORMAL LOW (ref 96–106)
Creatinine, Ser: 8.05 mg/dL — ABNORMAL HIGH (ref 0.76–1.27)
Globulin, Total: 3.3 g/dL (ref 1.5–4.5)
Glucose: 178 mg/dL — ABNORMAL HIGH (ref 70–99)
Potassium: 4.4 mmol/L (ref 3.5–5.2)
Sodium: 141 mmol/L (ref 134–144)
Total Protein: 7.4 g/dL (ref 6.0–8.5)
eGFR: 7 mL/min/{1.73_m2} — ABNORMAL LOW (ref >59)

## 2023-07-28 LAB — LIPID PANEL
Chol/HDL Ratio: 4 {ratio} (ref 0.0–5.0)
Cholesterol, Total: 246 mg/dL — ABNORMAL HIGH (ref 100–199)
HDL: 62 mg/dL (ref >39)
LDL Chol Calc (NIH): 168 mg/dL — ABNORMAL HIGH (ref 0–99)
Triglycerides: 94 mg/dL (ref 0–149)
VLDL Cholesterol Cal: 16 mg/dL (ref 5–40)

## 2023-07-28 LAB — B12 AND FOLATE PANEL
Folate: 7.2 ng/mL (ref >3.0)
Vitamin B-12: 595 pg/mL (ref 232–1245)

## 2023-07-28 LAB — HEMOGLOBIN A1C
Est. average glucose Bld gHb Est-mCnc: 197 mg/dL
Hgb A1c MFr Bld: 8.5 % — ABNORMAL HIGH (ref 4.8–5.6)

## 2023-07-28 LAB — VITAMIN D 25 HYDROXY (VIT D DEFICIENCY, FRACTURES): Vit D, 25-Hydroxy: 22.4 ng/mL — ABNORMAL LOW (ref 30.0–100.0)

## 2023-07-28 LAB — TSH+FREE T4
Free T4: 1.15 ng/dL (ref 0.82–1.77)
TSH: 1.02 u[IU]/mL (ref 0.450–4.500)

## 2023-07-29 ENCOUNTER — Other Ambulatory Visit: Payer: Self-pay | Admitting: Internal Medicine

## 2023-07-29 DIAGNOSIS — E1121 Type 2 diabetes mellitus with diabetic nephropathy: Secondary | ICD-10-CM

## 2023-07-29 DIAGNOSIS — E1169 Type 2 diabetes mellitus with other specified complication: Secondary | ICD-10-CM

## 2023-07-29 MED ORDER — ATORVASTATIN CALCIUM 40 MG PO TABS: 40.0000 mg | ORAL_TABLET | Freq: Every day | ORAL | 3 refills | Status: DC

## 2023-07-29 MED ORDER — OZEMPIC (0.25 OR 0.5 MG/DOSE) 2 MG/3ML ~~LOC~~ SOPN: 0.2500 mg | PEN_INJECTOR | SUBCUTANEOUS | 0 refills | Status: DC

## 2023-07-31 DIAGNOSIS — N186 End stage renal disease: Secondary | ICD-10-CM | POA: Diagnosis not present

## 2023-07-31 DIAGNOSIS — E1122 Type 2 diabetes mellitus with diabetic chronic kidney disease: Secondary | ICD-10-CM | POA: Diagnosis not present

## 2023-07-31 DIAGNOSIS — D631 Anemia in chronic kidney disease: Secondary | ICD-10-CM | POA: Diagnosis not present

## 2023-07-31 DIAGNOSIS — D509 Iron deficiency anemia, unspecified: Secondary | ICD-10-CM | POA: Diagnosis not present

## 2023-07-31 DIAGNOSIS — N2581 Secondary hyperparathyroidism of renal origin: Secondary | ICD-10-CM | POA: Diagnosis not present

## 2023-07-31 DIAGNOSIS — Z992 Dependence on renal dialysis: Secondary | ICD-10-CM | POA: Diagnosis not present

## 2023-07-31 DIAGNOSIS — D689 Coagulation defect, unspecified: Secondary | ICD-10-CM | POA: Diagnosis not present

## 2023-08-02 DIAGNOSIS — N186 End stage renal disease: Secondary | ICD-10-CM | POA: Diagnosis not present

## 2023-08-02 DIAGNOSIS — D509 Iron deficiency anemia, unspecified: Secondary | ICD-10-CM | POA: Diagnosis not present

## 2023-08-02 DIAGNOSIS — N2581 Secondary hyperparathyroidism of renal origin: Secondary | ICD-10-CM | POA: Diagnosis not present

## 2023-08-02 DIAGNOSIS — Z992 Dependence on renal dialysis: Secondary | ICD-10-CM | POA: Diagnosis not present

## 2023-08-02 DIAGNOSIS — D689 Coagulation defect, unspecified: Secondary | ICD-10-CM | POA: Diagnosis not present

## 2023-08-02 DIAGNOSIS — D631 Anemia in chronic kidney disease: Secondary | ICD-10-CM | POA: Diagnosis not present

## 2023-08-02 DIAGNOSIS — E1122 Type 2 diabetes mellitus with diabetic chronic kidney disease: Secondary | ICD-10-CM | POA: Diagnosis not present

## 2023-08-04 DIAGNOSIS — N2581 Secondary hyperparathyroidism of renal origin: Secondary | ICD-10-CM | POA: Diagnosis not present

## 2023-08-04 DIAGNOSIS — D689 Coagulation defect, unspecified: Secondary | ICD-10-CM | POA: Diagnosis not present

## 2023-08-04 DIAGNOSIS — D631 Anemia in chronic kidney disease: Secondary | ICD-10-CM | POA: Diagnosis not present

## 2023-08-04 DIAGNOSIS — D509 Iron deficiency anemia, unspecified: Secondary | ICD-10-CM | POA: Diagnosis not present

## 2023-08-04 DIAGNOSIS — E1122 Type 2 diabetes mellitus with diabetic chronic kidney disease: Secondary | ICD-10-CM | POA: Diagnosis not present

## 2023-08-04 DIAGNOSIS — Z992 Dependence on renal dialysis: Secondary | ICD-10-CM | POA: Diagnosis not present

## 2023-08-04 DIAGNOSIS — N186 End stage renal disease: Secondary | ICD-10-CM | POA: Diagnosis not present

## 2023-08-07 DIAGNOSIS — Z992 Dependence on renal dialysis: Secondary | ICD-10-CM | POA: Diagnosis not present

## 2023-08-07 DIAGNOSIS — D631 Anemia in chronic kidney disease: Secondary | ICD-10-CM | POA: Diagnosis not present

## 2023-08-07 DIAGNOSIS — N186 End stage renal disease: Secondary | ICD-10-CM | POA: Diagnosis not present

## 2023-08-07 DIAGNOSIS — D689 Coagulation defect, unspecified: Secondary | ICD-10-CM | POA: Diagnosis not present

## 2023-08-07 DIAGNOSIS — E1122 Type 2 diabetes mellitus with diabetic chronic kidney disease: Secondary | ICD-10-CM | POA: Diagnosis not present

## 2023-08-07 DIAGNOSIS — N2581 Secondary hyperparathyroidism of renal origin: Secondary | ICD-10-CM | POA: Diagnosis not present

## 2023-08-07 DIAGNOSIS — D509 Iron deficiency anemia, unspecified: Secondary | ICD-10-CM | POA: Diagnosis not present

## 2023-08-08 ENCOUNTER — Ambulatory Visit: Payer: Medicare Other | Attending: Cardiology | Admitting: Cardiology

## 2023-08-08 ENCOUNTER — Encounter: Payer: Self-pay | Admitting: Cardiology

## 2023-08-08 VITALS — BP 140/88 | HR 89 | Ht 71.0 in | Wt 281.0 lb

## 2023-08-08 DIAGNOSIS — I5032 Chronic diastolic (congestive) heart failure: Secondary | ICD-10-CM

## 2023-08-08 DIAGNOSIS — E782 Mixed hyperlipidemia: Secondary | ICD-10-CM | POA: Diagnosis not present

## 2023-08-08 DIAGNOSIS — I10 Essential (primary) hypertension: Secondary | ICD-10-CM | POA: Diagnosis not present

## 2023-08-08 NOTE — Progress Notes (Signed)
 Clinical Summary [name] is a 58 y.o.male seen today for follow up of the following medical problems.    1.Chronic diastolic HF - 03/2020 Echocardiogram shows a low-normal EF of 50% with no regional WMA. Noted to have severe LVH and Grade 2 DD.   -followed in HF clinic - myeloma panel negative, has not been able to get pyp scan due to insurance or a sleep study - he is on torsemide  80mg  bid, metolazone  2.5mg  once weekly though recently metolazone  has been on hold - last Cr 4.2, uptrend from 2.7 2 months ago. - notes mention weight had been up to 290 lbs, down to 276 today. Home weight down to 272 lbs.  - some ongoing swelling in legs, though much imprved. SOB improving. No orthopnea.    - now on HD for fluid management. Still makes some urine.  - no recent edema, no SOB/DOE.     PYP 12/22 equivocal for TTR amyloid, myeloma panel negative, gene testing for hATTR negative.   - repeat PYP 01/2022 Visual and quantitative assessment (grade 1, H/CL equal 1.0) are NOT suggestive of transthyretin amyloidosis.      - denies any LE edema, no SOB/DOE - fluid managed by HD   2. HTN   - reports low bp's during HD at times.  - reports kidney doctor had stopped labetalol  and hydralazine  - goes to HD T/TH/Sat. Can have some low bp's during seesions.      3. ESRD - followed by nephrology Dr Carrolyn Clan - from last note planning for vacsular consult to consider access   -occasional low bp's during HD.    4. Hyperlipidemia Mistakingly taking atorva and pravastatin . Jul 28, 2022 pcp had changed to arotvaistatin 40mg  daily.  - Jan 2025 TC 246 TG 94 HDL 62 LDL 168  5. DM2  - followed by pcp  Past Medical History:  Diagnosis Date   CHF (congestive heart failure) (HCC)    Diabetes mellitus    ESRD (end stage renal disease) (HCC)    Hypertension      No Active Allergies   Current Outpatient Medications  Medication Sig Dispense Refill   atorvastatin  (LIPITOR) 40 MG tablet Take 1  tablet (40 mg total) by mouth daily. 90 tablet 3   GARLIC PO Take 1 capsule by mouth daily.     lanthanum (FOSRENOL) 1000 MG chewable tablet Chew 1,000 mg by mouth 3 (three) times daily.     Magnesium 400 MG CAPS Take by mouth.     Multiple Vitamin (MULTIVITAMIN WITH MINERALS) TABS tablet Take 1 tablet by mouth daily.     Semaglutide ,0.25 or 0.5MG /DOS, (OZEMPIC , 0.25 OR 0.5 MG/DOSE,) 2 MG/3ML SOPN Inject 0.25 mg into the skin once a week for 28 days. 3 mL 0   vitamin C (ASCORBIC ACID) 500 MG tablet Take 500 mg by mouth daily.     Zinc 50 MG CAPS Take 50 mg by mouth every morning.     No current facility-administered medications for this visit.     Past Surgical History:  Procedure Laterality Date   A/V FISTULAGRAM Right 11/21/2021   Procedure: A/V Fistulagram;  Surgeon: Adine Hoof, MD;  Location: [facility] INVASIVE CV LAB;  Service: Cardiovascular;  Laterality: Right;   A/V FISTULAGRAM N/A 06/19/2023   Procedure: A/V Fistulagram;  Surgeon: Philipp Brawn, MD;  Location: [facility] INVASIVE CV LAB;  Service: Vascular;  Laterality: N/A;   AV FISTULA PLACEMENT Right 07/22/2021   Procedure: RIGHT  ARM FISTULA;  Surgeon: Margherita Shell, MD;  Location: [facility] OR;  Service: Vascular;  Laterality: Right;   INCISION AND DRAINAGE     INSERTION OF DIALYSIS CATHETER  07/22/2021   Procedure: INSERTION OF DIALYSIS CATHETER;  Surgeon: Margherita Shell, MD;  Location: [facility] OR;  Service: Vascular;;   KNEE SURGERY     PERIPHERAL VASCULAR BALLOON ANGIOPLASTY  06/19/2023   Procedure: PERIPHERAL VASCULAR BALLOON ANGIOPLASTY;  Surgeon: Philipp Brawn, MD;  Location: [facility] INVASIVE CV LAB;  Service: Vascular;;     No Active Allergies    Family History  Problem Relation Age of Onset   Dementia Mother    Diabetes Father    Cancer Maternal Uncle    Colon cancer Neg Hx      Social History Mr. Pollak reports that he has never smoked. He has never used smokeless tobacco. Mr. Thorell reports no history of  alcohol use.   Review of Systems CONSTITUTIONAL: No weight loss, fever, chills, weakness or fatigue.  HEENT: Eyes: No visual loss, blurred vision, double vision or yellow sclerae.No hearing loss, sneezing, congestion, runny nose or sore throat.  SKIN: No rash or itching.  CARDIOVASCULAR: per hpi RESPIRATORY: No shortness of breath, cough or sputum.  GASTROINTESTINAL: No anorexia, nausea, vomiting or diarrhea. No abdominal pain or blood.  GENITOURINARY: No burning on urination, no polyuria NEUROLOGICAL: No headache, dizziness, syncope, paralysis, ataxia, numbness or tingling in the extremities. No change in bowel or bladder control.  MUSCULOSKELETAL: No muscle, back pain, joint pain or stiffness.  LYMPHATICS: No enlarged nodes. No history of splenectomy.  PSYCHIATRIC: No history of depression or anxiety.  ENDOCRINOLOGIC: No reports of sweating, cold or heat intolerance. No polyuria or polydipsia.  [name]   Physical Examination Today's Vitals   08/08/23 1331 08/08/23 1348  BP: (!) 152/94 (!) 140/88  Pulse: 89   SpO2: 98%   Weight: 281 lb (127.5 kg)   Height: 5\' 11"  (1.803 m)    Body mass index is 39.19 kg/m.  Gen: resting comfortably, no acute distress HEENT: no scleral icterus, pupils equal round and reactive, no palptable cervical adenopathy,  CV: RRR, no mrg, no jvd Resp: Clear to auscultation bilaterally GI: abdomen is soft, non-tender, non-distended, normal bowel sounds, no hepatosplenomegaly MSK: extremities are warm, no edema.  Skin: warm, no rash Neuro:  no focal deficits Psych: appropriate affect   Diagnostic Studies     Assessment and Plan   Chronic diastolic HF - fluid management per dialysis - euvolemic today without symptoms, continue current meds -SGLT2i contraindicated in ESRD   2. HTN -much improved after starting HD, renal has actually stopped his labetalol  and hdyralazine. Can have some low bp's during HD at times - bp's mildly elevated today but  have been at goal other recent visits, with low bp's on HD at times would not add any meds at this time  3. Hyperlipidemia - mistakingly taking both pravastatin  and atorvastatin , reports some muscle cramping. He will stop pravastatin , continue atorva    Musab Wingard F. Evangelina Delancey, M.D.

## 2023-08-08 NOTE — Patient Instructions (Signed)
 Medication Instructions:  Your physician recommends that you continue on your current medications as directed. Please refer to the Current Medication list given to you today.  *If you need a refill on your cardiac medications before your next appointment, please call your pharmacy*   Lab Work: None If you have labs (blood work) drawn today and your tests are completely normal, you will receive your results only by: MyChart Message (if you have MyChart) OR A paper copy in the mail If you have any lab test that is abnormal or we need to change your treatment, we will call you to review the results.   Testing/Procedures: None   Follow-Up: At [facility], you and your health needs are our priority.  As part of our continuing mission to provide you with exceptional heart care, we have created designated Provider Care Teams.  These Care Teams include your primary Cardiologist (physician) and Advanced Practice Providers (APPs -  Physician Assistants and Nurse Practitioners) who all work together to provide you with the care you need, when you need it.  We recommend signing up for the patient portal called "MyChart".  Sign up information is provided on this After Visit Summary.  MyChart is used to connect with patients for Virtual Visits (Telemedicine).  Patients are able to view lab/test results, encounter notes, upcoming appointments, etc.  Non-urgent messages can be sent to your provider as well.   To learn more about what you can do with MyChart, go to [email or web address].    Your next appointment:   6 month(s)  Provider:   You may see Dina Rich, MD or one of the following Advanced Practice Providers on your designated Care Team:   Randall An, PA-C  Jacolyn Reedy, [place]     Other Instructions

## 2023-08-09 DIAGNOSIS — D509 Iron deficiency anemia, unspecified: Secondary | ICD-10-CM | POA: Diagnosis not present

## 2023-08-09 DIAGNOSIS — D689 Coagulation defect, unspecified: Secondary | ICD-10-CM | POA: Diagnosis not present

## 2023-08-09 DIAGNOSIS — N186 End stage renal disease: Secondary | ICD-10-CM | POA: Diagnosis not present

## 2023-08-09 DIAGNOSIS — E1122 Type 2 diabetes mellitus with diabetic chronic kidney disease: Secondary | ICD-10-CM | POA: Diagnosis not present

## 2023-08-09 DIAGNOSIS — Z992 Dependence on renal dialysis: Secondary | ICD-10-CM | POA: Diagnosis not present

## 2023-08-09 DIAGNOSIS — N2581 Secondary hyperparathyroidism of renal origin: Secondary | ICD-10-CM | POA: Diagnosis not present

## 2023-08-09 DIAGNOSIS — D631 Anemia in chronic kidney disease: Secondary | ICD-10-CM | POA: Diagnosis not present

## 2023-08-11 DIAGNOSIS — N2581 Secondary hyperparathyroidism of renal origin: Secondary | ICD-10-CM | POA: Diagnosis not present

## 2023-08-11 DIAGNOSIS — D509 Iron deficiency anemia, unspecified: Secondary | ICD-10-CM | POA: Diagnosis not present

## 2023-08-11 DIAGNOSIS — E1122 Type 2 diabetes mellitus with diabetic chronic kidney disease: Secondary | ICD-10-CM | POA: Diagnosis not present

## 2023-08-11 DIAGNOSIS — Z992 Dependence on renal dialysis: Secondary | ICD-10-CM | POA: Diagnosis not present

## 2023-08-11 DIAGNOSIS — N186 End stage renal disease: Secondary | ICD-10-CM | POA: Diagnosis not present

## 2023-08-11 DIAGNOSIS — D631 Anemia in chronic kidney disease: Secondary | ICD-10-CM | POA: Diagnosis not present

## 2023-08-11 DIAGNOSIS — D689 Coagulation defect, unspecified: Secondary | ICD-10-CM | POA: Diagnosis not present

## 2023-08-14 DIAGNOSIS — Z992 Dependence on renal dialysis: Secondary | ICD-10-CM | POA: Diagnosis not present

## 2023-08-14 DIAGNOSIS — D631 Anemia in chronic kidney disease: Secondary | ICD-10-CM | POA: Diagnosis not present

## 2023-08-14 DIAGNOSIS — N2581 Secondary hyperparathyroidism of renal origin: Secondary | ICD-10-CM | POA: Diagnosis not present

## 2023-08-14 DIAGNOSIS — D689 Coagulation defect, unspecified: Secondary | ICD-10-CM | POA: Diagnosis not present

## 2023-08-14 DIAGNOSIS — E1122 Type 2 diabetes mellitus with diabetic chronic kidney disease: Secondary | ICD-10-CM | POA: Diagnosis not present

## 2023-08-14 DIAGNOSIS — N186 End stage renal disease: Secondary | ICD-10-CM | POA: Diagnosis not present

## 2023-08-14 DIAGNOSIS — D509 Iron deficiency anemia, unspecified: Secondary | ICD-10-CM | POA: Diagnosis not present

## 2023-08-16 ENCOUNTER — Other Ambulatory Visit: Payer: Self-pay

## 2023-08-16 ENCOUNTER — Encounter (HOSPITAL_COMMUNITY)
Admission: RE | Admit: 2023-08-16 | Discharge: 2023-08-16 | Disposition: A | Discharge status: Home / Self Care | Payer: Medicare Other | Source: Ambulatory Visit | Attending: Internal Medicine | Admitting: Internal Medicine

## 2023-08-16 ENCOUNTER — Encounter (HOSPITAL_COMMUNITY): Payer: Self-pay

## 2023-08-16 DIAGNOSIS — D689 Coagulation defect, unspecified: Secondary | ICD-10-CM | POA: Diagnosis not present

## 2023-08-16 DIAGNOSIS — N186 End stage renal disease: Secondary | ICD-10-CM | POA: Diagnosis not present

## 2023-08-16 DIAGNOSIS — D631 Anemia in chronic kidney disease: Secondary | ICD-10-CM | POA: Diagnosis not present

## 2023-08-16 DIAGNOSIS — Z992 Dependence on renal dialysis: Secondary | ICD-10-CM | POA: Diagnosis not present

## 2023-08-16 DIAGNOSIS — E1122 Type 2 diabetes mellitus with diabetic chronic kidney disease: Secondary | ICD-10-CM | POA: Diagnosis not present

## 2023-08-16 DIAGNOSIS — D509 Iron deficiency anemia, unspecified: Secondary | ICD-10-CM | POA: Diagnosis not present

## 2023-08-16 DIAGNOSIS — N2581 Secondary hyperparathyroidism of renal origin: Secondary | ICD-10-CM | POA: Diagnosis not present

## 2023-08-16 NOTE — Progress Notes (Signed)
   08/16/23 0859  OBSTRUCTIVE SLEEP APNEA  Score 5 or greater  Results sent to PCP

## 2023-08-16 NOTE — Progress Notes (Shared)
   08/16/23 0844  OBSTRUCTIVE SLEEP APNEA  Have you ever been diagnosed with sleep apnea through a sleep study? No  Do you snore loudly (loud enough to be heard through closed doors)?  0  Do you often feel tired, fatigued, or sleepy during the daytime (such as falling asleep during driving or talking to someone)? 0  Has anyone observed you stop breathing during your sleep? 0  Do you have, or are you being treated for high blood pressure? 1  BMI more than 35 kg/m2? 1  Age > 50 (1-yes) 1  Neck circumference greater than:Male 16 inches or larger, Male 17inches or larger? 0  Male Gender (Yes=1) 1  Obstructive Sleep Apnea Score 4  Score 5 or greater  Results sent to PCP

## 2023-08-18 DIAGNOSIS — E1122 Type 2 diabetes mellitus with diabetic chronic kidney disease: Secondary | ICD-10-CM | POA: Diagnosis not present

## 2023-08-18 DIAGNOSIS — N2581 Secondary hyperparathyroidism of renal origin: Secondary | ICD-10-CM | POA: Diagnosis not present

## 2023-08-18 DIAGNOSIS — D689 Coagulation defect, unspecified: Secondary | ICD-10-CM | POA: Diagnosis not present

## 2023-08-18 DIAGNOSIS — D509 Iron deficiency anemia, unspecified: Secondary | ICD-10-CM | POA: Diagnosis not present

## 2023-08-18 DIAGNOSIS — D631 Anemia in chronic kidney disease: Secondary | ICD-10-CM | POA: Diagnosis not present

## 2023-08-18 DIAGNOSIS — N186 End stage renal disease: Secondary | ICD-10-CM | POA: Diagnosis not present

## 2023-08-18 DIAGNOSIS — Z992 Dependence on renal dialysis: Secondary | ICD-10-CM | POA: Diagnosis not present

## 2023-08-21 ENCOUNTER — Telehealth: Payer: Self-pay | Admitting: *Deleted

## 2023-08-21 DIAGNOSIS — N2581 Secondary hyperparathyroidism of renal origin: Secondary | ICD-10-CM | POA: Diagnosis not present

## 2023-08-21 DIAGNOSIS — D509 Iron deficiency anemia, unspecified: Secondary | ICD-10-CM | POA: Diagnosis not present

## 2023-08-21 DIAGNOSIS — D689 Coagulation defect, unspecified: Secondary | ICD-10-CM | POA: Diagnosis not present

## 2023-08-21 DIAGNOSIS — E1122 Type 2 diabetes mellitus with diabetic chronic kidney disease: Secondary | ICD-10-CM | POA: Diagnosis not present

## 2023-08-21 DIAGNOSIS — D631 Anemia in chronic kidney disease: Secondary | ICD-10-CM | POA: Diagnosis not present

## 2023-08-21 DIAGNOSIS — N186 End stage renal disease: Secondary | ICD-10-CM | POA: Diagnosis not present

## 2023-08-21 DIAGNOSIS — Z992 Dependence on renal dialysis: Secondary | ICD-10-CM | POA: Diagnosis not present

## 2023-08-21 NOTE — Telephone Encounter (Signed)
Patient came in stating he did not start his clear liquid diet and ate today. Procedure cancelled for tomorrow.

## 2023-08-21 NOTE — Progress Notes (Unsigned)
GI Office Note    Referring Provider: Billie Lade, MD Primary Care Physician:  Billie Lade, MD  Primary Gastroenterologist: Roetta Sessions, MD   Chief Complaint   No chief complaint on file.   History of Present Illness   [name] is a 58 y.o. male presenting today to reschedule colonoscopy.  He was on the schedule for tomorrow however he started clear liquid diet.  He was last seen in the office in November.  Colonoscopy planned as part of a kidney transplant workup.  Patient with history of end-stage renal disease on hemodialysis, hyperlipidemia, hypertension, diastolic heart failure, obstructive sleep apnea, type 2 diabetes mellitus.  He has been on dialysis for nearly 2 years.        Medications   Current Outpatient Medications  Medication Sig Dispense Refill   atorvastatin (LIPITOR) 40 MG tablet Take 1 tablet (40 mg total) by mouth daily. (Patient not taking: Reported on 08/08/2023) 90 tablet 3   GARLIC PO Take 1 capsule by mouth daily.     lanthanum (FOSRENOL) 1000 MG chewable tablet Chew 1,000 mg by mouth 3 (three) times daily.     Magnesium 400 MG CAPS Take by mouth.     Multiple Vitamin (MULTIVITAMIN WITH MINERALS) TABS tablet Take 1 tablet by mouth daily.     Semaglutide,0.25 or 0.5MG /DOS, (OZEMPIC, 0.25 OR 0.5 MG/DOSE,) 2 MG/3ML SOPN Inject 0.25 mg into the skin once a week for 28 days. (Patient not taking: Reported on 08/08/2023) 3 mL 0   vitamin C (ASCORBIC ACID) 500 MG tablet Take 500 mg by mouth daily.     Zinc 50 MG CAPS Take 50 mg by mouth every morning.     No current facility-administered medications for this visit.    Allergies   Allergies as of 08/22/2023   (No Known Allergies)     Past Medical History   Past Medical History:  Diagnosis Date   CHF (congestive heart failure) (HCC)    Diabetes mellitus    ESRD (end stage renal disease) (HCC)    Hypertension     Past Surgical History   Past Surgical History:  Procedure  Laterality Date   A/V FISTULAGRAM Right 11/21/2021   Procedure: A/V Fistulagram;  Surgeon: Maeola Harman, MD;  Location: [facility] INVASIVE CV LAB;  Service: Cardiovascular;  Laterality: Right;   A/V FISTULAGRAM N/A 06/19/2023   Procedure: A/V Fistulagram;  Surgeon: Daria Pastures, MD;  Location: [facility] INVASIVE CV LAB;  Service: Vascular;  Laterality: N/A;   AV FISTULA PLACEMENT Right 07/22/2021   Procedure: RIGHT  ARM FISTULA;  Surgeon: Nada Libman, MD;  Location: [facility] OR;  Service: Vascular;  Laterality: Right;   INCISION AND DRAINAGE     INSERTION OF DIALYSIS CATHETER  07/22/2021   Procedure: INSERTION OF DIALYSIS CATHETER;  Surgeon: Nada Libman, MD;  Location: [facility] OR;  Service: Vascular;;   KNEE SURGERY     PERIPHERAL VASCULAR BALLOON ANGIOPLASTY  06/19/2023   Procedure: PERIPHERAL VASCULAR BALLOON ANGIOPLASTY;  Surgeon: Daria Pastures, MD;  Location: [facility] INVASIVE CV LAB;  Service: Vascular;;    Past Family History   Family History  Problem Relation Age of Onset   Dementia Mother    Diabetes Father    Cancer Maternal Uncle    Colon cancer Neg Hx     Past Social History   Social History   Socioeconomic History   Marital status: Married    Spouse name: Not on  file   Number of children: Not on file   Years of education: Not on file   Highest education level: Not on file  Occupational History   Not on file  Tobacco Use   Smoking status: Never   Smokeless tobacco: Never  Vaping Use   Vaping status: Never Used  Substance and Sexual Activity   Alcohol use: No   Drug use: No   Sexual activity: Not on file  Other Topics Concern   Not on file  Social History Narrative   Not on file   Social Drivers of Health   Financial Resource Strain: High Risk (11/29/2020)   Overall Financial Resource Strain (CARDIA)    Difficulty of Paying Living Expenses: Hard  Food Insecurity: Food Insecurity Present (12/29/2020)   Hunger Vital Sign    Worried About Running Out of Food  in the Last Year: Sometimes true    Ran Out of Food in the Last Year: Never true  Transportation Needs: No Transportation Needs (11/29/2020)   PRAPARE - Administrator, Civil Service (Medical): No    Lack of Transportation (Non-Medical): No  Physical Activity: Not on file  Stress: Not on file  Social Connections: Not on file  Intimate Partner Violence: Not on file    Review of Systems   General: Negative for anorexia, weight loss, fever, chills, fatigue, weakness. ENT: Negative for hoarseness, difficulty swallowing , nasal congestion. CV: Negative for chest pain, angina, palpitations, dyspnea on exertion, peripheral edema.  Respiratory: Negative for dyspnea at rest, dyspnea on exertion, cough, sputum, wheezing.  GI: See history of present illness. GU:  Negative for dysuria, hematuria, urinary incontinence, urinary frequency, nocturnal urination.  Endo: Negative for unusual weight change.     Physical Exam   There were no vitals taken for this visit.   General: Well-nourished, well-developed in no acute distress.  Eyes: No icterus. Mouth: Oropharyngeal mucosa moist and pink , no lesions erythema or exudate. Lungs: Clear to auscultation bilaterally.  Heart: Regular rate and rhythm, no murmurs rubs or gallops.  Abdomen: Bowel sounds are normal, nontender, nondistended, no hepatosplenomegaly or masses,  no abdominal bruits or hernia , no rebound or guarding.  Rectal: ***  Extremities: No lower extremity edema. No clubbing or deformities. Neuro: Alert and oriented x 4   Skin: Warm and dry, no jaundice.   Psych: Alert and cooperative, normal mood and affect.  Labs   Lab Results  Component Value Date   NA 141 07/27/2023   CL 94 (L) 07/27/2023   K 4.4 07/27/2023   CO2 29 07/27/2023   BUN 60 (H) 07/27/2023   CREATININE 8.05 (H) 07/27/2023   EGFR 7 (L) 07/27/2023   CALCIUM 9.4 07/27/2023   PHOS 3.8 08/02/2021   ALBUMIN 4.1 07/27/2023   GLUCOSE 178 (H) 07/27/2023    Lab Results  Component Value Date   ALT 13 07/27/2023   AST 12 07/27/2023   ALKPHOS 90 07/27/2023   BILITOT 0.2 07/27/2023   Lab Results  Component Value Date   WBC 6.7 07/27/2023   HGB 11.5 (L) 07/27/2023   HCT 35.3 (L) 07/27/2023   MCV 87 07/27/2023   PLT 269 07/27/2023   Lab Results  Component Value Date   HGBA1C 8.5 (H) 07/27/2023   Lab Results  Component Value Date   TSH 1.020 07/27/2023   Lab Results  Component Value Date   VITAMINB12 595 07/27/2023   Lab Results  Component Value Date   FOLATE 7.2 07/27/2023  Imaging Studies   No results found.  Assessment       PLAN   ***   Leanna Battles. Melvyn Neth, MHS, PA-C [facility]

## 2023-08-22 ENCOUNTER — Ambulatory Visit: Payer: Medicare Other | Admitting: Gastroenterology

## 2023-08-22 ENCOUNTER — Encounter (HOSPITAL_COMMUNITY): Admission: RE | Payer: Self-pay | Source: Home / Self Care

## 2023-08-22 ENCOUNTER — Encounter: Payer: Self-pay | Admitting: *Deleted

## 2023-08-22 ENCOUNTER — Ambulatory Visit (HOSPITAL_COMMUNITY): Admission: RE | Admit: 2023-08-22 | Payer: Medicare Other | Source: Home / Self Care | Admitting: Internal Medicine

## 2023-08-22 ENCOUNTER — Encounter: Payer: Self-pay | Admitting: Gastroenterology

## 2023-08-22 VITALS — BP 139/76 | HR 90 | Temp 97.9°F | Ht 71.0 in | Wt 287.6 lb

## 2023-08-22 DIAGNOSIS — Z1211 Encounter for screening for malignant neoplasm of colon: Secondary | ICD-10-CM

## 2023-08-22 SURGERY — COLONOSCOPY WITH PROPOFOL
Anesthesia: Monitor Anesthesia Care

## 2023-08-22 NOTE — Patient Instructions (Signed)
Colonoscopy to be scheduled. See separate instructions.  ?

## 2023-08-23 ENCOUNTER — Encounter: Payer: Self-pay | Admitting: *Deleted

## 2023-08-23 DIAGNOSIS — D509 Iron deficiency anemia, unspecified: Secondary | ICD-10-CM | POA: Diagnosis not present

## 2023-08-23 DIAGNOSIS — D689 Coagulation defect, unspecified: Secondary | ICD-10-CM | POA: Diagnosis not present

## 2023-08-23 DIAGNOSIS — D631 Anemia in chronic kidney disease: Secondary | ICD-10-CM | POA: Diagnosis not present

## 2023-08-23 DIAGNOSIS — E1122 Type 2 diabetes mellitus with diabetic chronic kidney disease: Secondary | ICD-10-CM | POA: Diagnosis not present

## 2023-08-23 DIAGNOSIS — Z992 Dependence on renal dialysis: Secondary | ICD-10-CM | POA: Diagnosis not present

## 2023-08-23 DIAGNOSIS — N186 End stage renal disease: Secondary | ICD-10-CM | POA: Diagnosis not present

## 2023-08-23 DIAGNOSIS — N2581 Secondary hyperparathyroidism of renal origin: Secondary | ICD-10-CM | POA: Diagnosis not present

## 2023-08-23 NOTE — Telephone Encounter (Signed)
Pt seen in office and procedure was rescheduled  Pt's wife Drinda Butts (on dpr) informed pre-op will be done via telephone call on 10/03/23, Wednesday.

## 2023-08-24 DIAGNOSIS — N186 End stage renal disease: Secondary | ICD-10-CM | POA: Diagnosis not present

## 2023-08-24 DIAGNOSIS — Z992 Dependence on renal dialysis: Secondary | ICD-10-CM | POA: Diagnosis not present

## 2023-08-24 DIAGNOSIS — E1122 Type 2 diabetes mellitus with diabetic chronic kidney disease: Secondary | ICD-10-CM | POA: Diagnosis not present

## 2023-08-25 DIAGNOSIS — N186 End stage renal disease: Secondary | ICD-10-CM | POA: Diagnosis not present

## 2023-08-25 DIAGNOSIS — D689 Coagulation defect, unspecified: Secondary | ICD-10-CM | POA: Diagnosis not present

## 2023-08-25 DIAGNOSIS — D631 Anemia in chronic kidney disease: Secondary | ICD-10-CM | POA: Diagnosis not present

## 2023-08-25 DIAGNOSIS — Z992 Dependence on renal dialysis: Secondary | ICD-10-CM | POA: Diagnosis not present

## 2023-08-25 DIAGNOSIS — R52 Pain, unspecified: Secondary | ICD-10-CM | POA: Diagnosis not present

## 2023-08-25 DIAGNOSIS — E1122 Type 2 diabetes mellitus with diabetic chronic kidney disease: Secondary | ICD-10-CM | POA: Diagnosis not present

## 2023-08-25 DIAGNOSIS — E039 Hypothyroidism, unspecified: Secondary | ICD-10-CM | POA: Diagnosis not present

## 2023-08-25 DIAGNOSIS — D509 Iron deficiency anemia, unspecified: Secondary | ICD-10-CM | POA: Diagnosis not present

## 2023-08-25 DIAGNOSIS — N2581 Secondary hyperparathyroidism of renal origin: Secondary | ICD-10-CM | POA: Diagnosis not present

## 2023-08-28 DIAGNOSIS — D631 Anemia in chronic kidney disease: Secondary | ICD-10-CM | POA: Diagnosis not present

## 2023-08-28 DIAGNOSIS — N186 End stage renal disease: Secondary | ICD-10-CM | POA: Diagnosis not present

## 2023-08-28 DIAGNOSIS — D689 Coagulation defect, unspecified: Secondary | ICD-10-CM | POA: Diagnosis not present

## 2023-08-28 DIAGNOSIS — E1122 Type 2 diabetes mellitus with diabetic chronic kidney disease: Secondary | ICD-10-CM | POA: Diagnosis not present

## 2023-08-28 DIAGNOSIS — E039 Hypothyroidism, unspecified: Secondary | ICD-10-CM | POA: Diagnosis not present

## 2023-08-28 DIAGNOSIS — D509 Iron deficiency anemia, unspecified: Secondary | ICD-10-CM | POA: Diagnosis not present

## 2023-08-28 DIAGNOSIS — R52 Pain, unspecified: Secondary | ICD-10-CM | POA: Diagnosis not present

## 2023-08-28 DIAGNOSIS — N2581 Secondary hyperparathyroidism of renal origin: Secondary | ICD-10-CM | POA: Diagnosis not present

## 2023-08-28 DIAGNOSIS — Z992 Dependence on renal dialysis: Secondary | ICD-10-CM | POA: Diagnosis not present

## 2023-08-30 DIAGNOSIS — N186 End stage renal disease: Secondary | ICD-10-CM | POA: Diagnosis not present

## 2023-08-30 DIAGNOSIS — D631 Anemia in chronic kidney disease: Secondary | ICD-10-CM | POA: Diagnosis not present

## 2023-08-30 DIAGNOSIS — E039 Hypothyroidism, unspecified: Secondary | ICD-10-CM | POA: Diagnosis not present

## 2023-08-30 DIAGNOSIS — E1122 Type 2 diabetes mellitus with diabetic chronic kidney disease: Secondary | ICD-10-CM | POA: Diagnosis not present

## 2023-08-30 DIAGNOSIS — D509 Iron deficiency anemia, unspecified: Secondary | ICD-10-CM | POA: Diagnosis not present

## 2023-08-30 DIAGNOSIS — R52 Pain, unspecified: Secondary | ICD-10-CM | POA: Diagnosis not present

## 2023-08-30 DIAGNOSIS — N2581 Secondary hyperparathyroidism of renal origin: Secondary | ICD-10-CM | POA: Diagnosis not present

## 2023-08-30 DIAGNOSIS — Z992 Dependence on renal dialysis: Secondary | ICD-10-CM | POA: Diagnosis not present

## 2023-08-30 DIAGNOSIS — D689 Coagulation defect, unspecified: Secondary | ICD-10-CM | POA: Diagnosis not present

## 2023-09-01 DIAGNOSIS — E1122 Type 2 diabetes mellitus with diabetic chronic kidney disease: Secondary | ICD-10-CM | POA: Diagnosis not present

## 2023-09-01 DIAGNOSIS — R52 Pain, unspecified: Secondary | ICD-10-CM | POA: Diagnosis not present

## 2023-09-01 DIAGNOSIS — N186 End stage renal disease: Secondary | ICD-10-CM | POA: Diagnosis not present

## 2023-09-01 DIAGNOSIS — Z992 Dependence on renal dialysis: Secondary | ICD-10-CM | POA: Diagnosis not present

## 2023-09-01 DIAGNOSIS — D631 Anemia in chronic kidney disease: Secondary | ICD-10-CM | POA: Diagnosis not present

## 2023-09-01 DIAGNOSIS — D509 Iron deficiency anemia, unspecified: Secondary | ICD-10-CM | POA: Diagnosis not present

## 2023-09-01 DIAGNOSIS — D689 Coagulation defect, unspecified: Secondary | ICD-10-CM | POA: Diagnosis not present

## 2023-09-01 DIAGNOSIS — N2581 Secondary hyperparathyroidism of renal origin: Secondary | ICD-10-CM | POA: Diagnosis not present

## 2023-09-01 DIAGNOSIS — E039 Hypothyroidism, unspecified: Secondary | ICD-10-CM | POA: Diagnosis not present

## 2023-09-04 DIAGNOSIS — R52 Pain, unspecified: Secondary | ICD-10-CM | POA: Diagnosis not present

## 2023-09-04 DIAGNOSIS — E1122 Type 2 diabetes mellitus with diabetic chronic kidney disease: Secondary | ICD-10-CM | POA: Diagnosis not present

## 2023-09-04 DIAGNOSIS — D631 Anemia in chronic kidney disease: Secondary | ICD-10-CM | POA: Diagnosis not present

## 2023-09-04 DIAGNOSIS — N2581 Secondary hyperparathyroidism of renal origin: Secondary | ICD-10-CM | POA: Diagnosis not present

## 2023-09-04 DIAGNOSIS — D689 Coagulation defect, unspecified: Secondary | ICD-10-CM | POA: Diagnosis not present

## 2023-09-04 DIAGNOSIS — D509 Iron deficiency anemia, unspecified: Secondary | ICD-10-CM | POA: Diagnosis not present

## 2023-09-04 DIAGNOSIS — Z992 Dependence on renal dialysis: Secondary | ICD-10-CM | POA: Diagnosis not present

## 2023-09-04 DIAGNOSIS — E039 Hypothyroidism, unspecified: Secondary | ICD-10-CM | POA: Diagnosis not present

## 2023-09-04 DIAGNOSIS — N186 End stage renal disease: Secondary | ICD-10-CM | POA: Diagnosis not present

## 2023-09-06 DIAGNOSIS — R52 Pain, unspecified: Secondary | ICD-10-CM | POA: Diagnosis not present

## 2023-09-06 DIAGNOSIS — E1122 Type 2 diabetes mellitus with diabetic chronic kidney disease: Secondary | ICD-10-CM | POA: Diagnosis not present

## 2023-09-06 DIAGNOSIS — N2581 Secondary hyperparathyroidism of renal origin: Secondary | ICD-10-CM | POA: Diagnosis not present

## 2023-09-06 DIAGNOSIS — Z992 Dependence on renal dialysis: Secondary | ICD-10-CM | POA: Diagnosis not present

## 2023-09-06 DIAGNOSIS — D689 Coagulation defect, unspecified: Secondary | ICD-10-CM | POA: Diagnosis not present

## 2023-09-06 DIAGNOSIS — N186 End stage renal disease: Secondary | ICD-10-CM | POA: Diagnosis not present

## 2023-09-06 DIAGNOSIS — D509 Iron deficiency anemia, unspecified: Secondary | ICD-10-CM | POA: Diagnosis not present

## 2023-09-06 DIAGNOSIS — E039 Hypothyroidism, unspecified: Secondary | ICD-10-CM | POA: Diagnosis not present

## 2023-09-06 DIAGNOSIS — D631 Anemia in chronic kidney disease: Secondary | ICD-10-CM | POA: Diagnosis not present

## 2023-09-08 DIAGNOSIS — D689 Coagulation defect, unspecified: Secondary | ICD-10-CM | POA: Diagnosis not present

## 2023-09-08 DIAGNOSIS — N2581 Secondary hyperparathyroidism of renal origin: Secondary | ICD-10-CM | POA: Diagnosis not present

## 2023-09-08 DIAGNOSIS — D631 Anemia in chronic kidney disease: Secondary | ICD-10-CM | POA: Diagnosis not present

## 2023-09-08 DIAGNOSIS — D509 Iron deficiency anemia, unspecified: Secondary | ICD-10-CM | POA: Diagnosis not present

## 2023-09-08 DIAGNOSIS — R52 Pain, unspecified: Secondary | ICD-10-CM | POA: Diagnosis not present

## 2023-09-08 DIAGNOSIS — Z992 Dependence on renal dialysis: Secondary | ICD-10-CM | POA: Diagnosis not present

## 2023-09-08 DIAGNOSIS — E1122 Type 2 diabetes mellitus with diabetic chronic kidney disease: Secondary | ICD-10-CM | POA: Diagnosis not present

## 2023-09-08 DIAGNOSIS — N186 End stage renal disease: Secondary | ICD-10-CM | POA: Diagnosis not present

## 2023-09-08 DIAGNOSIS — E039 Hypothyroidism, unspecified: Secondary | ICD-10-CM | POA: Diagnosis not present

## 2023-09-11 DIAGNOSIS — R52 Pain, unspecified: Secondary | ICD-10-CM | POA: Diagnosis not present

## 2023-09-11 DIAGNOSIS — Z992 Dependence on renal dialysis: Secondary | ICD-10-CM | POA: Diagnosis not present

## 2023-09-11 DIAGNOSIS — D689 Coagulation defect, unspecified: Secondary | ICD-10-CM | POA: Diagnosis not present

## 2023-09-11 DIAGNOSIS — N186 End stage renal disease: Secondary | ICD-10-CM | POA: Diagnosis not present

## 2023-09-11 DIAGNOSIS — N2581 Secondary hyperparathyroidism of renal origin: Secondary | ICD-10-CM | POA: Diagnosis not present

## 2023-09-11 DIAGNOSIS — D631 Anemia in chronic kidney disease: Secondary | ICD-10-CM | POA: Diagnosis not present

## 2023-09-11 DIAGNOSIS — E1122 Type 2 diabetes mellitus with diabetic chronic kidney disease: Secondary | ICD-10-CM | POA: Diagnosis not present

## 2023-09-11 DIAGNOSIS — E039 Hypothyroidism, unspecified: Secondary | ICD-10-CM | POA: Diagnosis not present

## 2023-09-11 DIAGNOSIS — D509 Iron deficiency anemia, unspecified: Secondary | ICD-10-CM | POA: Diagnosis not present

## 2023-09-13 DIAGNOSIS — N2581 Secondary hyperparathyroidism of renal origin: Secondary | ICD-10-CM | POA: Diagnosis not present

## 2023-09-13 DIAGNOSIS — R52 Pain, unspecified: Secondary | ICD-10-CM | POA: Diagnosis not present

## 2023-09-13 DIAGNOSIS — E1122 Type 2 diabetes mellitus with diabetic chronic kidney disease: Secondary | ICD-10-CM | POA: Diagnosis not present

## 2023-09-13 DIAGNOSIS — E039 Hypothyroidism, unspecified: Secondary | ICD-10-CM | POA: Diagnosis not present

## 2023-09-13 DIAGNOSIS — N186 End stage renal disease: Secondary | ICD-10-CM | POA: Diagnosis not present

## 2023-09-13 DIAGNOSIS — D631 Anemia in chronic kidney disease: Secondary | ICD-10-CM | POA: Diagnosis not present

## 2023-09-13 DIAGNOSIS — D509 Iron deficiency anemia, unspecified: Secondary | ICD-10-CM | POA: Diagnosis not present

## 2023-09-13 DIAGNOSIS — D689 Coagulation defect, unspecified: Secondary | ICD-10-CM | POA: Diagnosis not present

## 2023-09-13 DIAGNOSIS — Z992 Dependence on renal dialysis: Secondary | ICD-10-CM | POA: Diagnosis not present

## 2023-09-15 DIAGNOSIS — D689 Coagulation defect, unspecified: Secondary | ICD-10-CM | POA: Diagnosis not present

## 2023-09-15 DIAGNOSIS — N186 End stage renal disease: Secondary | ICD-10-CM | POA: Diagnosis not present

## 2023-09-15 DIAGNOSIS — Z992 Dependence on renal dialysis: Secondary | ICD-10-CM | POA: Diagnosis not present

## 2023-09-15 DIAGNOSIS — E1122 Type 2 diabetes mellitus with diabetic chronic kidney disease: Secondary | ICD-10-CM | POA: Diagnosis not present

## 2023-09-15 DIAGNOSIS — R52 Pain, unspecified: Secondary | ICD-10-CM | POA: Diagnosis not present

## 2023-09-15 DIAGNOSIS — E039 Hypothyroidism, unspecified: Secondary | ICD-10-CM | POA: Diagnosis not present

## 2023-09-15 DIAGNOSIS — N2581 Secondary hyperparathyroidism of renal origin: Secondary | ICD-10-CM | POA: Diagnosis not present

## 2023-09-15 DIAGNOSIS — D631 Anemia in chronic kidney disease: Secondary | ICD-10-CM | POA: Diagnosis not present

## 2023-09-15 DIAGNOSIS — D509 Iron deficiency anemia, unspecified: Secondary | ICD-10-CM | POA: Diagnosis not present

## 2023-09-18 DIAGNOSIS — D689 Coagulation defect, unspecified: Secondary | ICD-10-CM | POA: Diagnosis not present

## 2023-09-18 DIAGNOSIS — N2581 Secondary hyperparathyroidism of renal origin: Secondary | ICD-10-CM | POA: Diagnosis not present

## 2023-09-18 DIAGNOSIS — D631 Anemia in chronic kidney disease: Secondary | ICD-10-CM | POA: Diagnosis not present

## 2023-09-18 DIAGNOSIS — D509 Iron deficiency anemia, unspecified: Secondary | ICD-10-CM | POA: Diagnosis not present

## 2023-09-18 DIAGNOSIS — E1122 Type 2 diabetes mellitus with diabetic chronic kidney disease: Secondary | ICD-10-CM | POA: Diagnosis not present

## 2023-09-18 DIAGNOSIS — Z992 Dependence on renal dialysis: Secondary | ICD-10-CM | POA: Diagnosis not present

## 2023-09-18 DIAGNOSIS — R52 Pain, unspecified: Secondary | ICD-10-CM | POA: Diagnosis not present

## 2023-09-18 DIAGNOSIS — N186 End stage renal disease: Secondary | ICD-10-CM | POA: Diagnosis not present

## 2023-09-18 DIAGNOSIS — E039 Hypothyroidism, unspecified: Secondary | ICD-10-CM | POA: Diagnosis not present

## 2023-09-20 DIAGNOSIS — R52 Pain, unspecified: Secondary | ICD-10-CM | POA: Diagnosis not present

## 2023-09-20 DIAGNOSIS — N2581 Secondary hyperparathyroidism of renal origin: Secondary | ICD-10-CM | POA: Diagnosis not present

## 2023-09-20 DIAGNOSIS — E1122 Type 2 diabetes mellitus with diabetic chronic kidney disease: Secondary | ICD-10-CM | POA: Diagnosis not present

## 2023-09-20 DIAGNOSIS — Z992 Dependence on renal dialysis: Secondary | ICD-10-CM | POA: Diagnosis not present

## 2023-09-20 DIAGNOSIS — D689 Coagulation defect, unspecified: Secondary | ICD-10-CM | POA: Diagnosis not present

## 2023-09-20 DIAGNOSIS — E039 Hypothyroidism, unspecified: Secondary | ICD-10-CM | POA: Diagnosis not present

## 2023-09-20 DIAGNOSIS — D631 Anemia in chronic kidney disease: Secondary | ICD-10-CM | POA: Diagnosis not present

## 2023-09-20 DIAGNOSIS — D509 Iron deficiency anemia, unspecified: Secondary | ICD-10-CM | POA: Diagnosis not present

## 2023-09-20 DIAGNOSIS — N186 End stage renal disease: Secondary | ICD-10-CM | POA: Diagnosis not present

## 2023-09-21 DIAGNOSIS — E1122 Type 2 diabetes mellitus with diabetic chronic kidney disease: Secondary | ICD-10-CM | POA: Diagnosis not present

## 2023-09-21 DIAGNOSIS — N186 End stage renal disease: Secondary | ICD-10-CM | POA: Diagnosis not present

## 2023-09-21 DIAGNOSIS — Z992 Dependence on renal dialysis: Secondary | ICD-10-CM | POA: Diagnosis not present

## 2023-09-22 DIAGNOSIS — Z992 Dependence on renal dialysis: Secondary | ICD-10-CM | POA: Diagnosis not present

## 2023-09-22 DIAGNOSIS — E1122 Type 2 diabetes mellitus with diabetic chronic kidney disease: Secondary | ICD-10-CM | POA: Diagnosis not present

## 2023-09-22 DIAGNOSIS — D509 Iron deficiency anemia, unspecified: Secondary | ICD-10-CM | POA: Diagnosis not present

## 2023-09-22 DIAGNOSIS — D631 Anemia in chronic kidney disease: Secondary | ICD-10-CM | POA: Diagnosis not present

## 2023-09-22 DIAGNOSIS — D689 Coagulation defect, unspecified: Secondary | ICD-10-CM | POA: Diagnosis not present

## 2023-09-22 DIAGNOSIS — N186 End stage renal disease: Secondary | ICD-10-CM | POA: Diagnosis not present

## 2023-09-22 DIAGNOSIS — N2581 Secondary hyperparathyroidism of renal origin: Secondary | ICD-10-CM | POA: Diagnosis not present

## 2023-09-25 DIAGNOSIS — Z992 Dependence on renal dialysis: Secondary | ICD-10-CM | POA: Diagnosis not present

## 2023-09-25 DIAGNOSIS — E1122 Type 2 diabetes mellitus with diabetic chronic kidney disease: Secondary | ICD-10-CM | POA: Diagnosis not present

## 2023-09-25 DIAGNOSIS — D689 Coagulation defect, unspecified: Secondary | ICD-10-CM | POA: Diagnosis not present

## 2023-09-25 DIAGNOSIS — D509 Iron deficiency anemia, unspecified: Secondary | ICD-10-CM | POA: Diagnosis not present

## 2023-09-25 DIAGNOSIS — N2581 Secondary hyperparathyroidism of renal origin: Secondary | ICD-10-CM | POA: Diagnosis not present

## 2023-09-25 DIAGNOSIS — N186 End stage renal disease: Secondary | ICD-10-CM | POA: Diagnosis not present

## 2023-09-25 DIAGNOSIS — D631 Anemia in chronic kidney disease: Secondary | ICD-10-CM | POA: Diagnosis not present

## 2023-09-27 DIAGNOSIS — D689 Coagulation defect, unspecified: Secondary | ICD-10-CM | POA: Diagnosis not present

## 2023-09-27 DIAGNOSIS — N186 End stage renal disease: Secondary | ICD-10-CM | POA: Diagnosis not present

## 2023-09-27 DIAGNOSIS — D509 Iron deficiency anemia, unspecified: Secondary | ICD-10-CM | POA: Diagnosis not present

## 2023-09-27 DIAGNOSIS — E1122 Type 2 diabetes mellitus with diabetic chronic kidney disease: Secondary | ICD-10-CM | POA: Diagnosis not present

## 2023-09-27 DIAGNOSIS — Z992 Dependence on renal dialysis: Secondary | ICD-10-CM | POA: Diagnosis not present

## 2023-09-27 DIAGNOSIS — N2581 Secondary hyperparathyroidism of renal origin: Secondary | ICD-10-CM | POA: Diagnosis not present

## 2023-09-27 DIAGNOSIS — D631 Anemia in chronic kidney disease: Secondary | ICD-10-CM | POA: Diagnosis not present

## 2023-09-29 DIAGNOSIS — D689 Coagulation defect, unspecified: Secondary | ICD-10-CM | POA: Diagnosis not present

## 2023-09-29 DIAGNOSIS — N186 End stage renal disease: Secondary | ICD-10-CM | POA: Diagnosis not present

## 2023-09-29 DIAGNOSIS — N2581 Secondary hyperparathyroidism of renal origin: Secondary | ICD-10-CM | POA: Diagnosis not present

## 2023-09-29 DIAGNOSIS — E1122 Type 2 diabetes mellitus with diabetic chronic kidney disease: Secondary | ICD-10-CM | POA: Diagnosis not present

## 2023-09-29 DIAGNOSIS — D631 Anemia in chronic kidney disease: Secondary | ICD-10-CM | POA: Diagnosis not present

## 2023-09-29 DIAGNOSIS — D509 Iron deficiency anemia, unspecified: Secondary | ICD-10-CM | POA: Diagnosis not present

## 2023-09-29 DIAGNOSIS — Z992 Dependence on renal dialysis: Secondary | ICD-10-CM | POA: Diagnosis not present

## 2023-10-02 DIAGNOSIS — D509 Iron deficiency anemia, unspecified: Secondary | ICD-10-CM | POA: Diagnosis not present

## 2023-10-02 DIAGNOSIS — Z992 Dependence on renal dialysis: Secondary | ICD-10-CM | POA: Diagnosis not present

## 2023-10-02 DIAGNOSIS — N186 End stage renal disease: Secondary | ICD-10-CM | POA: Diagnosis not present

## 2023-10-02 DIAGNOSIS — E1122 Type 2 diabetes mellitus with diabetic chronic kidney disease: Secondary | ICD-10-CM | POA: Diagnosis not present

## 2023-10-02 DIAGNOSIS — D689 Coagulation defect, unspecified: Secondary | ICD-10-CM | POA: Diagnosis not present

## 2023-10-02 DIAGNOSIS — D631 Anemia in chronic kidney disease: Secondary | ICD-10-CM | POA: Diagnosis not present

## 2023-10-02 DIAGNOSIS — N2581 Secondary hyperparathyroidism of renal origin: Secondary | ICD-10-CM | POA: Diagnosis not present

## 2023-10-03 ENCOUNTER — Encounter (HOSPITAL_COMMUNITY): Payer: Self-pay

## 2023-10-03 ENCOUNTER — Encounter (HOSPITAL_COMMUNITY)
Admission: RE | Admit: 2023-10-03 | Discharge: 2023-10-03 | Disposition: A | Discharge status: Home / Self Care | Payer: Medicare Other | Source: Ambulatory Visit | Attending: Internal Medicine | Admitting: Internal Medicine

## 2023-10-03 ENCOUNTER — Other Ambulatory Visit: Payer: Self-pay

## 2023-10-03 VITALS — Ht 71.0 in | Wt 287.7 lb

## 2023-10-03 DIAGNOSIS — N186 End stage renal disease: Secondary | ICD-10-CM

## 2023-10-03 DIAGNOSIS — I10 Essential (primary) hypertension: Secondary | ICD-10-CM

## 2023-10-04 ENCOUNTER — Telehealth: Payer: Self-pay | Admitting: *Deleted

## 2023-10-04 DIAGNOSIS — N186 End stage renal disease: Secondary | ICD-10-CM | POA: Diagnosis not present

## 2023-10-04 DIAGNOSIS — E1122 Type 2 diabetes mellitus with diabetic chronic kidney disease: Secondary | ICD-10-CM | POA: Diagnosis not present

## 2023-10-04 DIAGNOSIS — D689 Coagulation defect, unspecified: Secondary | ICD-10-CM | POA: Diagnosis not present

## 2023-10-04 DIAGNOSIS — Z992 Dependence on renal dialysis: Secondary | ICD-10-CM | POA: Diagnosis not present

## 2023-10-04 DIAGNOSIS — N2581 Secondary hyperparathyroidism of renal origin: Secondary | ICD-10-CM | POA: Diagnosis not present

## 2023-10-04 DIAGNOSIS — D509 Iron deficiency anemia, unspecified: Secondary | ICD-10-CM | POA: Diagnosis not present

## 2023-10-04 DIAGNOSIS — D631 Anemia in chronic kidney disease: Secondary | ICD-10-CM | POA: Diagnosis not present

## 2023-10-04 NOTE — Telephone Encounter (Signed)
 Pt is scheduled for procedure tomorrow morning. He says that he had eaten a steak,egg and cheese biscuit this morning around 10 am. He says he hasn't eaten anything since then.  Please advise.Thank you

## 2023-10-04 NOTE — Telephone Encounter (Signed)
 Pt informed of providers message and recommendations. Verbalized understanding. Advised pt will call once we get providers schedule for May.  TCS, asa 3 Dialysis pt

## 2023-10-05 ENCOUNTER — Encounter (HOSPITAL_COMMUNITY): Admission: RE | Payer: Self-pay | Source: Home / Self Care

## 2023-10-05 ENCOUNTER — Ambulatory Visit (HOSPITAL_COMMUNITY): Admission: RE | Admit: 2023-10-05 | Payer: Medicare Other | Source: Home / Self Care | Admitting: Internal Medicine

## 2023-10-05 SURGERY — COLONOSCOPY WITH PROPOFOL
Anesthesia: Choice

## 2023-10-06 DIAGNOSIS — N2581 Secondary hyperparathyroidism of renal origin: Secondary | ICD-10-CM | POA: Diagnosis not present

## 2023-10-06 DIAGNOSIS — D631 Anemia in chronic kidney disease: Secondary | ICD-10-CM | POA: Diagnosis not present

## 2023-10-06 DIAGNOSIS — N186 End stage renal disease: Secondary | ICD-10-CM | POA: Diagnosis not present

## 2023-10-06 DIAGNOSIS — Z992 Dependence on renal dialysis: Secondary | ICD-10-CM | POA: Diagnosis not present

## 2023-10-06 DIAGNOSIS — E1122 Type 2 diabetes mellitus with diabetic chronic kidney disease: Secondary | ICD-10-CM | POA: Diagnosis not present

## 2023-10-06 DIAGNOSIS — D689 Coagulation defect, unspecified: Secondary | ICD-10-CM | POA: Diagnosis not present

## 2023-10-06 DIAGNOSIS — D509 Iron deficiency anemia, unspecified: Secondary | ICD-10-CM | POA: Diagnosis not present

## 2023-10-09 DIAGNOSIS — D509 Iron deficiency anemia, unspecified: Secondary | ICD-10-CM | POA: Diagnosis not present

## 2023-10-09 DIAGNOSIS — N186 End stage renal disease: Secondary | ICD-10-CM | POA: Diagnosis not present

## 2023-10-09 DIAGNOSIS — N2581 Secondary hyperparathyroidism of renal origin: Secondary | ICD-10-CM | POA: Diagnosis not present

## 2023-10-09 DIAGNOSIS — Z992 Dependence on renal dialysis: Secondary | ICD-10-CM | POA: Diagnosis not present

## 2023-10-09 DIAGNOSIS — D689 Coagulation defect, unspecified: Secondary | ICD-10-CM | POA: Diagnosis not present

## 2023-10-09 DIAGNOSIS — E1122 Type 2 diabetes mellitus with diabetic chronic kidney disease: Secondary | ICD-10-CM | POA: Diagnosis not present

## 2023-10-09 DIAGNOSIS — D631 Anemia in chronic kidney disease: Secondary | ICD-10-CM | POA: Diagnosis not present

## 2023-10-11 DIAGNOSIS — N2581 Secondary hyperparathyroidism of renal origin: Secondary | ICD-10-CM | POA: Diagnosis not present

## 2023-10-11 DIAGNOSIS — D689 Coagulation defect, unspecified: Secondary | ICD-10-CM | POA: Diagnosis not present

## 2023-10-11 DIAGNOSIS — E1122 Type 2 diabetes mellitus with diabetic chronic kidney disease: Secondary | ICD-10-CM | POA: Diagnosis not present

## 2023-10-11 DIAGNOSIS — Z992 Dependence on renal dialysis: Secondary | ICD-10-CM | POA: Diagnosis not present

## 2023-10-11 DIAGNOSIS — N186 End stage renal disease: Secondary | ICD-10-CM | POA: Diagnosis not present

## 2023-10-11 DIAGNOSIS — D509 Iron deficiency anemia, unspecified: Secondary | ICD-10-CM | POA: Diagnosis not present

## 2023-10-11 DIAGNOSIS — D631 Anemia in chronic kidney disease: Secondary | ICD-10-CM | POA: Diagnosis not present

## 2023-10-13 DIAGNOSIS — Z992 Dependence on renal dialysis: Secondary | ICD-10-CM | POA: Diagnosis not present

## 2023-10-13 DIAGNOSIS — E1122 Type 2 diabetes mellitus with diabetic chronic kidney disease: Secondary | ICD-10-CM | POA: Diagnosis not present

## 2023-10-13 DIAGNOSIS — N2581 Secondary hyperparathyroidism of renal origin: Secondary | ICD-10-CM | POA: Diagnosis not present

## 2023-10-13 DIAGNOSIS — D689 Coagulation defect, unspecified: Secondary | ICD-10-CM | POA: Diagnosis not present

## 2023-10-13 DIAGNOSIS — D509 Iron deficiency anemia, unspecified: Secondary | ICD-10-CM | POA: Diagnosis not present

## 2023-10-13 DIAGNOSIS — D631 Anemia in chronic kidney disease: Secondary | ICD-10-CM | POA: Diagnosis not present

## 2023-10-13 DIAGNOSIS — N186 End stage renal disease: Secondary | ICD-10-CM | POA: Diagnosis not present

## 2023-10-16 DIAGNOSIS — D509 Iron deficiency anemia, unspecified: Secondary | ICD-10-CM | POA: Diagnosis not present

## 2023-10-16 DIAGNOSIS — N186 End stage renal disease: Secondary | ICD-10-CM | POA: Diagnosis not present

## 2023-10-16 DIAGNOSIS — D631 Anemia in chronic kidney disease: Secondary | ICD-10-CM | POA: Diagnosis not present

## 2023-10-16 DIAGNOSIS — N2581 Secondary hyperparathyroidism of renal origin: Secondary | ICD-10-CM | POA: Diagnosis not present

## 2023-10-16 DIAGNOSIS — Z992 Dependence on renal dialysis: Secondary | ICD-10-CM | POA: Diagnosis not present

## 2023-10-16 DIAGNOSIS — D689 Coagulation defect, unspecified: Secondary | ICD-10-CM | POA: Diagnosis not present

## 2023-10-16 DIAGNOSIS — E1122 Type 2 diabetes mellitus with diabetic chronic kidney disease: Secondary | ICD-10-CM | POA: Diagnosis not present

## 2023-10-18 DIAGNOSIS — N186 End stage renal disease: Secondary | ICD-10-CM | POA: Diagnosis not present

## 2023-10-18 DIAGNOSIS — E1122 Type 2 diabetes mellitus with diabetic chronic kidney disease: Secondary | ICD-10-CM | POA: Diagnosis not present

## 2023-10-18 DIAGNOSIS — D509 Iron deficiency anemia, unspecified: Secondary | ICD-10-CM | POA: Diagnosis not present

## 2023-10-18 DIAGNOSIS — Z992 Dependence on renal dialysis: Secondary | ICD-10-CM | POA: Diagnosis not present

## 2023-10-18 DIAGNOSIS — D689 Coagulation defect, unspecified: Secondary | ICD-10-CM | POA: Diagnosis not present

## 2023-10-18 DIAGNOSIS — N2581 Secondary hyperparathyroidism of renal origin: Secondary | ICD-10-CM | POA: Diagnosis not present

## 2023-10-18 DIAGNOSIS — D631 Anemia in chronic kidney disease: Secondary | ICD-10-CM | POA: Diagnosis not present

## 2023-10-20 DIAGNOSIS — D689 Coagulation defect, unspecified: Secondary | ICD-10-CM | POA: Diagnosis not present

## 2023-10-20 DIAGNOSIS — N186 End stage renal disease: Secondary | ICD-10-CM | POA: Diagnosis not present

## 2023-10-20 DIAGNOSIS — Z992 Dependence on renal dialysis: Secondary | ICD-10-CM | POA: Diagnosis not present

## 2023-10-20 DIAGNOSIS — D509 Iron deficiency anemia, unspecified: Secondary | ICD-10-CM | POA: Diagnosis not present

## 2023-10-20 DIAGNOSIS — N2581 Secondary hyperparathyroidism of renal origin: Secondary | ICD-10-CM | POA: Diagnosis not present

## 2023-10-20 DIAGNOSIS — D631 Anemia in chronic kidney disease: Secondary | ICD-10-CM | POA: Diagnosis not present

## 2023-10-20 DIAGNOSIS — E1122 Type 2 diabetes mellitus with diabetic chronic kidney disease: Secondary | ICD-10-CM | POA: Diagnosis not present

## 2023-10-22 DIAGNOSIS — N186 End stage renal disease: Secondary | ICD-10-CM | POA: Diagnosis not present

## 2023-10-22 DIAGNOSIS — Z992 Dependence on renal dialysis: Secondary | ICD-10-CM | POA: Diagnosis not present

## 2023-10-22 DIAGNOSIS — E1122 Type 2 diabetes mellitus with diabetic chronic kidney disease: Secondary | ICD-10-CM | POA: Diagnosis not present

## 2023-10-23 DIAGNOSIS — D631 Anemia in chronic kidney disease: Secondary | ICD-10-CM | POA: Diagnosis not present

## 2023-10-23 DIAGNOSIS — N186 End stage renal disease: Secondary | ICD-10-CM | POA: Diagnosis not present

## 2023-10-23 DIAGNOSIS — D689 Coagulation defect, unspecified: Secondary | ICD-10-CM | POA: Diagnosis not present

## 2023-10-23 DIAGNOSIS — Z992 Dependence on renal dialysis: Secondary | ICD-10-CM | POA: Diagnosis not present

## 2023-10-23 DIAGNOSIS — D509 Iron deficiency anemia, unspecified: Secondary | ICD-10-CM | POA: Diagnosis not present

## 2023-10-23 DIAGNOSIS — E1122 Type 2 diabetes mellitus with diabetic chronic kidney disease: Secondary | ICD-10-CM | POA: Diagnosis not present

## 2023-10-23 DIAGNOSIS — N2581 Secondary hyperparathyroidism of renal origin: Secondary | ICD-10-CM | POA: Diagnosis not present

## 2023-10-25 DIAGNOSIS — D509 Iron deficiency anemia, unspecified: Secondary | ICD-10-CM | POA: Diagnosis not present

## 2023-10-25 DIAGNOSIS — Z992 Dependence on renal dialysis: Secondary | ICD-10-CM | POA: Diagnosis not present

## 2023-10-25 DIAGNOSIS — D631 Anemia in chronic kidney disease: Secondary | ICD-10-CM | POA: Diagnosis not present

## 2023-10-25 DIAGNOSIS — N186 End stage renal disease: Secondary | ICD-10-CM | POA: Diagnosis not present

## 2023-10-25 DIAGNOSIS — D689 Coagulation defect, unspecified: Secondary | ICD-10-CM | POA: Diagnosis not present

## 2023-10-25 DIAGNOSIS — E1122 Type 2 diabetes mellitus with diabetic chronic kidney disease: Secondary | ICD-10-CM | POA: Diagnosis not present

## 2023-10-25 DIAGNOSIS — N2581 Secondary hyperparathyroidism of renal origin: Secondary | ICD-10-CM | POA: Diagnosis not present

## 2023-10-27 DIAGNOSIS — N2581 Secondary hyperparathyroidism of renal origin: Secondary | ICD-10-CM | POA: Diagnosis not present

## 2023-10-27 DIAGNOSIS — D509 Iron deficiency anemia, unspecified: Secondary | ICD-10-CM | POA: Diagnosis not present

## 2023-10-27 DIAGNOSIS — E1122 Type 2 diabetes mellitus with diabetic chronic kidney disease: Secondary | ICD-10-CM | POA: Diagnosis not present

## 2023-10-27 DIAGNOSIS — Z992 Dependence on renal dialysis: Secondary | ICD-10-CM | POA: Diagnosis not present

## 2023-10-27 DIAGNOSIS — D689 Coagulation defect, unspecified: Secondary | ICD-10-CM | POA: Diagnosis not present

## 2023-10-27 DIAGNOSIS — D631 Anemia in chronic kidney disease: Secondary | ICD-10-CM | POA: Diagnosis not present

## 2023-10-27 DIAGNOSIS — N186 End stage renal disease: Secondary | ICD-10-CM | POA: Diagnosis not present

## 2023-10-30 ENCOUNTER — Ambulatory Visit: Payer: Medicare Other | Admitting: Cardiology

## 2023-10-30 DIAGNOSIS — D509 Iron deficiency anemia, unspecified: Secondary | ICD-10-CM | POA: Diagnosis not present

## 2023-10-30 DIAGNOSIS — D689 Coagulation defect, unspecified: Secondary | ICD-10-CM | POA: Diagnosis not present

## 2023-10-30 DIAGNOSIS — N186 End stage renal disease: Secondary | ICD-10-CM | POA: Diagnosis not present

## 2023-10-30 DIAGNOSIS — E1122 Type 2 diabetes mellitus with diabetic chronic kidney disease: Secondary | ICD-10-CM | POA: Diagnosis not present

## 2023-10-30 DIAGNOSIS — N2581 Secondary hyperparathyroidism of renal origin: Secondary | ICD-10-CM | POA: Diagnosis not present

## 2023-10-30 DIAGNOSIS — Z992 Dependence on renal dialysis: Secondary | ICD-10-CM | POA: Diagnosis not present

## 2023-10-30 DIAGNOSIS — D631 Anemia in chronic kidney disease: Secondary | ICD-10-CM | POA: Diagnosis not present

## 2023-10-31 NOTE — Telephone Encounter (Signed)
 Attempted to call pt to schedule for procedure. Unable to leave message due to mailbox being full.

## 2023-11-01 DIAGNOSIS — D631 Anemia in chronic kidney disease: Secondary | ICD-10-CM | POA: Diagnosis not present

## 2023-11-01 DIAGNOSIS — Z992 Dependence on renal dialysis: Secondary | ICD-10-CM | POA: Diagnosis not present

## 2023-11-01 DIAGNOSIS — N186 End stage renal disease: Secondary | ICD-10-CM | POA: Diagnosis not present

## 2023-11-01 DIAGNOSIS — D509 Iron deficiency anemia, unspecified: Secondary | ICD-10-CM | POA: Diagnosis not present

## 2023-11-01 DIAGNOSIS — N2581 Secondary hyperparathyroidism of renal origin: Secondary | ICD-10-CM | POA: Diagnosis not present

## 2023-11-01 DIAGNOSIS — D689 Coagulation defect, unspecified: Secondary | ICD-10-CM | POA: Diagnosis not present

## 2023-11-01 DIAGNOSIS — E1122 Type 2 diabetes mellitus with diabetic chronic kidney disease: Secondary | ICD-10-CM | POA: Diagnosis not present

## 2023-11-02 ENCOUNTER — Other Ambulatory Visit (HOSPITAL_COMMUNITY): Payer: Self-pay

## 2023-11-02 ENCOUNTER — Ambulatory Visit: Payer: Medicare Other | Admitting: Internal Medicine

## 2023-11-02 ENCOUNTER — Encounter: Payer: Self-pay | Admitting: Internal Medicine

## 2023-11-02 ENCOUNTER — Telehealth: Payer: Self-pay | Admitting: Pharmacy Technician

## 2023-11-02 ENCOUNTER — Telehealth: Payer: Self-pay | Admitting: Internal Medicine

## 2023-11-02 VITALS — BP 145/72 | HR 94 | Ht 71.0 in | Wt 287.0 lb

## 2023-11-02 DIAGNOSIS — E1169 Type 2 diabetes mellitus with other specified complication: Secondary | ICD-10-CM | POA: Diagnosis not present

## 2023-11-02 DIAGNOSIS — I10 Essential (primary) hypertension: Secondary | ICD-10-CM | POA: Diagnosis not present

## 2023-11-02 DIAGNOSIS — E1121 Type 2 diabetes mellitus with diabetic nephropathy: Secondary | ICD-10-CM | POA: Diagnosis not present

## 2023-11-02 DIAGNOSIS — E66812 Obesity, class 2: Secondary | ICD-10-CM

## 2023-11-02 DIAGNOSIS — E785 Hyperlipidemia, unspecified: Secondary | ICD-10-CM

## 2023-11-02 DIAGNOSIS — Z6838 Body mass index (BMI) 38.0-38.9, adult: Secondary | ICD-10-CM

## 2023-11-02 DIAGNOSIS — G4733 Obstructive sleep apnea (adult) (pediatric): Secondary | ICD-10-CM

## 2023-11-02 DIAGNOSIS — Z7985 Long-term (current) use of injectable non-insulin antidiabetic drugs: Secondary | ICD-10-CM

## 2023-11-02 MED ORDER — ATORVASTATIN CALCIUM 40 MG PO TABS: 40.0000 mg | ORAL_TABLET | Freq: Every day | ORAL | 3 refills | Status: AC

## 2023-11-02 MED ORDER — TIRZEPATIDE 2.5 MG/0.5ML ~~LOC~~ SOAJ: 2.5000 mg | SUBCUTANEOUS | 0 refills | Status: DC

## 2023-11-02 NOTE — Assessment & Plan Note (Signed)
 Remains mildly elevated today but acceptable.  He experiences borderline low readings with HD.  Per cardiology, would not add any antihypertensive therapy.

## 2023-11-02 NOTE — Assessment & Plan Note (Addendum)
 A1c 8.5 in January.  Ozempic was prescribed in light of this result but he did not fill the prescription due to cost.  We discussed treatment options today and I have ultimately prescribed Mounjaro 2.5 mg weekly.  He will notify us after he completes his third injection and will also notify us if starting Greggory Keen is cost prohibitive.  Repeat A1c at follow-up in 3 months.

## 2023-11-02 NOTE — Telephone Encounter (Signed)
 Pharmacy Patient Advocate Encounter   Received notification from CoverMyMeds that prior authorization for Mounjaro 2.5MG /0.5ML auto-injectors is required/requested.   Insurance verification completed.   The patient is insured through [facility] .   Per test claim: PA required; PA submitted to above mentioned insurance via CoverMyMeds Key/confirmation #/EOC [id number] Status is pending

## 2023-11-02 NOTE — Telephone Encounter (Signed)
 LMOVM to return call.

## 2023-11-02 NOTE — Progress Notes (Signed)
 Established Patient Office Visit  Subjective   Patient ID: [name], male    DOB: 1965/09/28  Age: 58 y.o. MRN: [id number]  Chief Complaint  Patient presents with   Care Management    Three month follow up    Mr. Caraway returns to care today for routine follow-up.  He was last evaluated by me on 1/3.  No medication changes were made at that time, repeat labs ordered, and 62-month follow-up arranged.  In the interim, he has been seen by cardiology and gastroenterology.  He was scheduled undergo colonoscopy 1/29 and 3/14 but both procedures were canceled.  There have otherwise been no acute interval events. Mr. Fillinger reports feeling well today.  He is asymptomatic and has no acute concerns to discuss.  He states that he never started Ozempic due to cost.  He also reports that he developed severe cramping when taking atorvastatin and has since discontinued it.  On further questioning, he acknowledges that he was taking pravastatin and atorvastatin at the same time.  Past Medical History:  Diagnosis Date   CHF (congestive heart failure) (HCC)    Diabetes mellitus    ESRD (end stage renal disease) (HCC)    Hypertension    Past Surgical History:  Procedure Laterality Date   A/V FISTULAGRAM Right 11/21/2021   Procedure: A/V Fistulagram;  Surgeon: Maeola Harman, MD;  Location: [facility] INVASIVE CV LAB;  Service: Cardiovascular;  Laterality: Right;   A/V FISTULAGRAM N/A 06/19/2023   Procedure: A/V Fistulagram;  Surgeon: Daria Pastures, MD;  Location: [facility] INVASIVE CV LAB;  Service: Vascular;  Laterality: N/A;   AV FISTULA PLACEMENT Right 07/22/2021   Procedure: RIGHT  ARM FISTULA;  Surgeon: Nada Libman, MD;  Location: [facility] OR;  Service: Vascular;  Laterality: Right;   INCISION AND DRAINAGE     INSERTION OF DIALYSIS CATHETER  07/22/2021   Procedure: INSERTION OF DIALYSIS CATHETER;  Surgeon: Nada Libman, MD;  Location: [facility] OR;  Service: Vascular;;   KNEE SURGERY Left     PERIPHERAL VASCULAR BALLOON ANGIOPLASTY  06/19/2023   Procedure: PERIPHERAL VASCULAR BALLOON ANGIOPLASTY;  Surgeon: Daria Pastures, MD;  Location: [facility] INVASIVE CV LAB;  Service: Vascular;;   Social History   Tobacco Use   Smoking status: Never   Smokeless tobacco: Never  Vaping Use   Vaping status: Never Used  Substance Use Topics   Alcohol use: No   Drug use: No   Family History  Problem Relation Age of Onset   Dementia Mother    Diabetes Father    Cancer Maternal Uncle    Colon cancer Neg Hx    No Known Allergies   Review of Systems  Constitutional:  Negative for chills and fever.  HENT:  Negative for sore throat.   Respiratory:  Negative for cough and shortness of breath.   Cardiovascular:  Negative for chest pain, palpitations and leg swelling.  Gastrointestinal:  Negative for abdominal pain, blood in stool, constipation, diarrhea, nausea and vomiting.  Genitourinary:  Negative for dysuria and hematuria.  Musculoskeletal:  Negative for myalgias.  Skin:  Negative for itching and rash.  Neurological:  Negative for dizziness and headaches.  Psychiatric/Behavioral:  Negative for depression and suicidal ideas.      Objective:     BP (!) 145/72   Pulse 94   Ht 5\' 11"  (1.803 m)   Wt 287 lb (130.2 kg)   SpO2 98%   BMI 40.03 kg/m  BP Readings from  Last 3 Encounters:  11/02/23 (!) 145/72  08/22/23 139/76  08/08/23 (!) 140/88   Physical Exam Vitals reviewed.  Constitutional:      General: He is not in acute distress.    Appearance: Normal appearance. He is obese. He is not ill-appearing.  HENT:     Head: Normocephalic and atraumatic.     Right Ear: External ear normal.     Left Ear: External ear normal.     Nose: Nose normal. No congestion or rhinorrhea.     Mouth/Throat:     Mouth: Mucous membranes are moist.     Pharynx: Oropharynx is clear.  Eyes:     General: No scleral icterus.    Extraocular Movements: Extraocular movements intact.      Conjunctiva/sclera: Conjunctivae normal.     Pupils: Pupils are equal, round, and reactive to light.  Cardiovascular:     Rate and Rhythm: Normal rate and regular rhythm.     Pulses: Normal pulses.     Heart sounds: Normal heart sounds. No murmur heard. Pulmonary:     Effort: Pulmonary effort is normal.     Breath sounds: Normal breath sounds. No wheezing, rhonchi or rales.  Abdominal:     General: Abdomen is flat. Bowel sounds are normal. There is no distension.     Palpations: Abdomen is soft.     Tenderness: There is no abdominal tenderness.  Musculoskeletal:        General: No swelling or deformity. Normal range of motion.     Cervical back: Normal range of motion.     Comments: Right forearm AV fistula with a palpable pulse and audible bruit.  Skin:    General: Skin is warm and dry.     Capillary Refill: Capillary refill takes less than 2 seconds.  Neurological:     General: No focal deficit present.     Mental Status: He is alert and oriented to person, place, and time.     Motor: No weakness.  Psychiatric:        Mood and Affect: Mood normal.        Behavior: Behavior normal.        Thought Content: Thought content normal.   Last CBC Lab Results  Component Value Date   WBC 6.7 07/27/2023   HGB 11.5 (L) 07/27/2023   HCT 35.3 (L) 07/27/2023   MCV 87 07/27/2023   MCH 28.3 07/27/2023   RDW 14.2 07/27/2023   PLT 269 07/27/2023   Last metabolic panel Lab Results  Component Value Date   GLUCOSE 178 (H) 07/27/2023   NA 141 07/27/2023   K 4.4 07/27/2023   CL 94 (L) 07/27/2023   CO2 29 07/27/2023   BUN 60 (H) 07/27/2023   CREATININE 8.05 (H) 07/27/2023   EGFR 7 (L) 07/27/2023   CALCIUM 9.4 07/27/2023   PHOS 3.8 08/02/2021   PROT 7.4 07/27/2023   ALBUMIN 4.1 07/27/2023   LABGLOB 3.3 07/27/2023   AGRATIO 1.0 12/29/2020   BILITOT 0.2 07/27/2023   ALKPHOS 90 07/27/2023   AST 12 07/27/2023   ALT 13 07/27/2023   ANIONGAP 10 08/02/2021   Last lipids Lab Results   Component Value Date   CHOL 246 (H) 07/27/2023   HDL 62 07/27/2023   LDLCALC 168 (H) 07/27/2023   TRIG 94 07/27/2023   CHOLHDL 4.0 07/27/2023   Last hemoglobin A1c Lab Results  Component Value Date   HGBA1C 8.5 (H) 07/27/2023   Last thyroid functions Lab Results  Component  Value Date   TSH 1.020 07/27/2023   Last vitamin D Lab Results  Component Value Date   VD25OH 22.4 (L) 07/27/2023   Last vitamin B12 and Folate Lab Results  Component Value Date   VITAMINB12 595 07/27/2023   FOLATE 7.2 07/27/2023   The 10-year ASCVD risk score (Arnett DK, et al., 2019) is: 17.1%    Assessment & Plan:   Problem List Items Addressed This Visit       Essential hypertension   Remains mildly elevated today but acceptable.  He experiences borderline low readings with HD.  Per cardiology, would not add any antihypertensive therapy.      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  Ozempic was prescribed in light of this result but he did not fill the prescription due to cost.  We discussed treatment options today and I have ultimately prescribed Mounjaro 2.5 mg weekly.  He will notify us after he completes his third injection and will also notify us if starting Greggory Keen is cost prohibitive.  Repeat A1c at follow-up in 3 months.      Hyperlipidemia associated with type 2 diabetes mellitus (HCC)   Lipid panel updated in January.  Total cholesterol 246 and LDL 168.  Atorvastatin 40 mg daily was prescribed.  Pravastatin 80 mg daily was discontinued.  He endorses severe cramping when starting atorvastatin, however he continued to take pravastatin concomitantly.  He has stopped atorvastatin but continues to take pravastatin. -Treatment options reviewed today.  I recommended discontinuing pravastatin and starting atorvastatin 40 mg daily.  Can switch to a different statin if he develops severe cramping again.  He is in agreement with this plan.       Return in about  3 months (around 02/01/2024).    Billie Lade, MD

## 2023-11-02 NOTE — Patient Instructions (Signed)
 It was a pleasure to see you today.  Thank you for giving [place] the opportunity to be involved in your care.  Below is a brief recap of your visit and next steps.  We will plan to see you again in 3 months.  Summary Discontinue pravastatin and start atorvastatin 40 mg daily Start Mounjaro 2.5 mg weekly for diabetes management. Please let us know after you complete your third injection Follow up in 3 months

## 2023-11-02 NOTE — Telephone Encounter (Signed)
 Copied from CRM [id number]. Topic: General - Other >> Nov 02, 2023  1:33 PM Corinna Lines S wrote: Reason for CRM: Jamir with [employer] called to state that the patient's med of Munjaro was apprvd 11/02/23-11/01/24.Marland KitchenMarland Kitchenapprvl ifo will be faed to doc office

## 2023-11-02 NOTE — Assessment & Plan Note (Signed)
 Lipid panel updated in January.  Total cholesterol 246 and LDL 168.  Atorvastatin 40 mg daily was prescribed.  Pravastatin 80 mg daily was discontinued.  He endorses severe cramping when starting atorvastatin, however he continued to take pravastatin concomitantly.  He has stopped atorvastatin but continues to take pravastatin. -Treatment options reviewed today.  I recommended discontinuing pravastatin and starting atorvastatin 40 mg daily.  Can switch to a different statin if he develops severe cramping again.  He is in agreement with this plan.

## 2023-11-02 NOTE — Telephone Encounter (Signed)
 Pharmacy Patient Advocate Encounter  Received notification from [employer] that Prior Authorization for [facility] 2.5MG /0.5ML auto-injectors has been APPROVED from 11/02/2023 to 11/01/2024. Ran test claim, Copay is $420.00. This test claim was processed through [facility]- copay amounts may vary at other pharmacies due to pharmacy/plan contracts, or as the patient moves through the different stages of their insurance plan.   PA #/Case ID/Reference #: Key: [id number]  Patient may have cost concerns. Copay is applied to the deductible of $375.00 plus his $45.00 copay. Refills after the first fill will be $45.00.

## 2023-11-03 DIAGNOSIS — E1122 Type 2 diabetes mellitus with diabetic chronic kidney disease: Secondary | ICD-10-CM | POA: Diagnosis not present

## 2023-11-03 DIAGNOSIS — D631 Anemia in chronic kidney disease: Secondary | ICD-10-CM | POA: Diagnosis not present

## 2023-11-03 DIAGNOSIS — D509 Iron deficiency anemia, unspecified: Secondary | ICD-10-CM | POA: Diagnosis not present

## 2023-11-03 DIAGNOSIS — Z992 Dependence on renal dialysis: Secondary | ICD-10-CM | POA: Diagnosis not present

## 2023-11-03 DIAGNOSIS — N186 End stage renal disease: Secondary | ICD-10-CM | POA: Diagnosis not present

## 2023-11-03 DIAGNOSIS — N2581 Secondary hyperparathyroidism of renal origin: Secondary | ICD-10-CM | POA: Diagnosis not present

## 2023-11-03 DIAGNOSIS — D689 Coagulation defect, unspecified: Secondary | ICD-10-CM | POA: Diagnosis not present

## 2023-11-06 DIAGNOSIS — N186 End stage renal disease: Secondary | ICD-10-CM | POA: Diagnosis not present

## 2023-11-06 DIAGNOSIS — E1122 Type 2 diabetes mellitus with diabetic chronic kidney disease: Secondary | ICD-10-CM | POA: Diagnosis not present

## 2023-11-06 DIAGNOSIS — D631 Anemia in chronic kidney disease: Secondary | ICD-10-CM | POA: Diagnosis not present

## 2023-11-06 DIAGNOSIS — D509 Iron deficiency anemia, unspecified: Secondary | ICD-10-CM | POA: Diagnosis not present

## 2023-11-06 DIAGNOSIS — N2581 Secondary hyperparathyroidism of renal origin: Secondary | ICD-10-CM | POA: Diagnosis not present

## 2023-11-06 DIAGNOSIS — D689 Coagulation defect, unspecified: Secondary | ICD-10-CM | POA: Diagnosis not present

## 2023-11-06 DIAGNOSIS — Z992 Dependence on renal dialysis: Secondary | ICD-10-CM | POA: Diagnosis not present

## 2023-11-08 DIAGNOSIS — E1122 Type 2 diabetes mellitus with diabetic chronic kidney disease: Secondary | ICD-10-CM | POA: Diagnosis not present

## 2023-11-08 DIAGNOSIS — D689 Coagulation defect, unspecified: Secondary | ICD-10-CM | POA: Diagnosis not present

## 2023-11-08 DIAGNOSIS — N2581 Secondary hyperparathyroidism of renal origin: Secondary | ICD-10-CM | POA: Diagnosis not present

## 2023-11-08 DIAGNOSIS — D631 Anemia in chronic kidney disease: Secondary | ICD-10-CM | POA: Diagnosis not present

## 2023-11-08 DIAGNOSIS — Z992 Dependence on renal dialysis: Secondary | ICD-10-CM | POA: Diagnosis not present

## 2023-11-08 DIAGNOSIS — N186 End stage renal disease: Secondary | ICD-10-CM | POA: Diagnosis not present

## 2023-11-08 DIAGNOSIS — D509 Iron deficiency anemia, unspecified: Secondary | ICD-10-CM | POA: Diagnosis not present

## 2023-11-10 DIAGNOSIS — D509 Iron deficiency anemia, unspecified: Secondary | ICD-10-CM | POA: Diagnosis not present

## 2023-11-10 DIAGNOSIS — D689 Coagulation defect, unspecified: Secondary | ICD-10-CM | POA: Diagnosis not present

## 2023-11-10 DIAGNOSIS — D631 Anemia in chronic kidney disease: Secondary | ICD-10-CM | POA: Diagnosis not present

## 2023-11-10 DIAGNOSIS — Z992 Dependence on renal dialysis: Secondary | ICD-10-CM | POA: Diagnosis not present

## 2023-11-10 DIAGNOSIS — N186 End stage renal disease: Secondary | ICD-10-CM | POA: Diagnosis not present

## 2023-11-10 DIAGNOSIS — N2581 Secondary hyperparathyroidism of renal origin: Secondary | ICD-10-CM | POA: Diagnosis not present

## 2023-11-10 DIAGNOSIS — E1122 Type 2 diabetes mellitus with diabetic chronic kidney disease: Secondary | ICD-10-CM | POA: Diagnosis not present

## 2023-11-13 DIAGNOSIS — D509 Iron deficiency anemia, unspecified: Secondary | ICD-10-CM | POA: Diagnosis not present

## 2023-11-13 DIAGNOSIS — Z992 Dependence on renal dialysis: Secondary | ICD-10-CM | POA: Diagnosis not present

## 2023-11-13 DIAGNOSIS — E1122 Type 2 diabetes mellitus with diabetic chronic kidney disease: Secondary | ICD-10-CM | POA: Diagnosis not present

## 2023-11-13 DIAGNOSIS — N186 End stage renal disease: Secondary | ICD-10-CM | POA: Diagnosis not present

## 2023-11-13 DIAGNOSIS — D689 Coagulation defect, unspecified: Secondary | ICD-10-CM | POA: Diagnosis not present

## 2023-11-13 DIAGNOSIS — D631 Anemia in chronic kidney disease: Secondary | ICD-10-CM | POA: Diagnosis not present

## 2023-11-13 DIAGNOSIS — N2581 Secondary hyperparathyroidism of renal origin: Secondary | ICD-10-CM | POA: Diagnosis not present

## 2023-11-14 ENCOUNTER — Telehealth: Payer: Self-pay

## 2023-11-14 NOTE — Telephone Encounter (Signed)
 Patient was identified as falling into the [place] Measure - Diabetes.   Patient was: Appointment already scheduled for:  02/04/24. Seen on 11/02/23

## 2023-11-15 DIAGNOSIS — D689 Coagulation defect, unspecified: Secondary | ICD-10-CM | POA: Diagnosis not present

## 2023-11-15 DIAGNOSIS — D509 Iron deficiency anemia, unspecified: Secondary | ICD-10-CM | POA: Diagnosis not present

## 2023-11-15 DIAGNOSIS — N2581 Secondary hyperparathyroidism of renal origin: Secondary | ICD-10-CM | POA: Diagnosis not present

## 2023-11-15 DIAGNOSIS — D631 Anemia in chronic kidney disease: Secondary | ICD-10-CM | POA: Diagnosis not present

## 2023-11-15 DIAGNOSIS — N186 End stage renal disease: Secondary | ICD-10-CM | POA: Diagnosis not present

## 2023-11-15 DIAGNOSIS — Z992 Dependence on renal dialysis: Secondary | ICD-10-CM | POA: Diagnosis not present

## 2023-11-15 DIAGNOSIS — E1122 Type 2 diabetes mellitus with diabetic chronic kidney disease: Secondary | ICD-10-CM | POA: Diagnosis not present

## 2023-11-17 DIAGNOSIS — D509 Iron deficiency anemia, unspecified: Secondary | ICD-10-CM | POA: Diagnosis not present

## 2023-11-17 DIAGNOSIS — D631 Anemia in chronic kidney disease: Secondary | ICD-10-CM | POA: Diagnosis not present

## 2023-11-17 DIAGNOSIS — E1122 Type 2 diabetes mellitus with diabetic chronic kidney disease: Secondary | ICD-10-CM | POA: Diagnosis not present

## 2023-11-17 DIAGNOSIS — Z992 Dependence on renal dialysis: Secondary | ICD-10-CM | POA: Diagnosis not present

## 2023-11-17 DIAGNOSIS — N2581 Secondary hyperparathyroidism of renal origin: Secondary | ICD-10-CM | POA: Diagnosis not present

## 2023-11-17 DIAGNOSIS — N186 End stage renal disease: Secondary | ICD-10-CM | POA: Diagnosis not present

## 2023-11-17 DIAGNOSIS — D689 Coagulation defect, unspecified: Secondary | ICD-10-CM | POA: Diagnosis not present

## 2023-11-20 DIAGNOSIS — D509 Iron deficiency anemia, unspecified: Secondary | ICD-10-CM | POA: Diagnosis not present

## 2023-11-20 DIAGNOSIS — D689 Coagulation defect, unspecified: Secondary | ICD-10-CM | POA: Diagnosis not present

## 2023-11-20 DIAGNOSIS — Z992 Dependence on renal dialysis: Secondary | ICD-10-CM | POA: Diagnosis not present

## 2023-11-20 DIAGNOSIS — D631 Anemia in chronic kidney disease: Secondary | ICD-10-CM | POA: Diagnosis not present

## 2023-11-20 DIAGNOSIS — N186 End stage renal disease: Secondary | ICD-10-CM | POA: Diagnosis not present

## 2023-11-20 DIAGNOSIS — E1122 Type 2 diabetes mellitus with diabetic chronic kidney disease: Secondary | ICD-10-CM | POA: Diagnosis not present

## 2023-11-20 DIAGNOSIS — N2581 Secondary hyperparathyroidism of renal origin: Secondary | ICD-10-CM | POA: Diagnosis not present

## 2023-11-21 DIAGNOSIS — Z992 Dependence on renal dialysis: Secondary | ICD-10-CM | POA: Diagnosis not present

## 2023-11-21 DIAGNOSIS — E1122 Type 2 diabetes mellitus with diabetic chronic kidney disease: Secondary | ICD-10-CM | POA: Diagnosis not present

## 2023-11-21 DIAGNOSIS — N186 End stage renal disease: Secondary | ICD-10-CM | POA: Diagnosis not present

## 2023-11-22 DIAGNOSIS — E8770 Fluid overload, unspecified: Secondary | ICD-10-CM | POA: Diagnosis not present

## 2023-11-22 DIAGNOSIS — Z992 Dependence on renal dialysis: Secondary | ICD-10-CM | POA: Diagnosis not present

## 2023-11-22 DIAGNOSIS — D689 Coagulation defect, unspecified: Secondary | ICD-10-CM | POA: Diagnosis not present

## 2023-11-22 DIAGNOSIS — D631 Anemia in chronic kidney disease: Secondary | ICD-10-CM | POA: Diagnosis not present

## 2023-11-22 DIAGNOSIS — E1122 Type 2 diabetes mellitus with diabetic chronic kidney disease: Secondary | ICD-10-CM | POA: Diagnosis not present

## 2023-11-22 DIAGNOSIS — E039 Hypothyroidism, unspecified: Secondary | ICD-10-CM | POA: Diagnosis not present

## 2023-11-22 DIAGNOSIS — D509 Iron deficiency anemia, unspecified: Secondary | ICD-10-CM | POA: Diagnosis not present

## 2023-11-22 DIAGNOSIS — N186 End stage renal disease: Secondary | ICD-10-CM | POA: Diagnosis not present

## 2023-11-22 DIAGNOSIS — N2581 Secondary hyperparathyroidism of renal origin: Secondary | ICD-10-CM | POA: Diagnosis not present

## 2023-11-24 DIAGNOSIS — N186 End stage renal disease: Secondary | ICD-10-CM | POA: Diagnosis not present

## 2023-11-24 DIAGNOSIS — E039 Hypothyroidism, unspecified: Secondary | ICD-10-CM | POA: Diagnosis not present

## 2023-11-24 DIAGNOSIS — N2581 Secondary hyperparathyroidism of renal origin: Secondary | ICD-10-CM | POA: Diagnosis not present

## 2023-11-24 DIAGNOSIS — D689 Coagulation defect, unspecified: Secondary | ICD-10-CM | POA: Diagnosis not present

## 2023-11-24 DIAGNOSIS — D631 Anemia in chronic kidney disease: Secondary | ICD-10-CM | POA: Diagnosis not present

## 2023-11-24 DIAGNOSIS — E8770 Fluid overload, unspecified: Secondary | ICD-10-CM | POA: Diagnosis not present

## 2023-11-24 DIAGNOSIS — D509 Iron deficiency anemia, unspecified: Secondary | ICD-10-CM | POA: Diagnosis not present

## 2023-11-24 DIAGNOSIS — E1122 Type 2 diabetes mellitus with diabetic chronic kidney disease: Secondary | ICD-10-CM | POA: Diagnosis not present

## 2023-11-24 DIAGNOSIS — Z992 Dependence on renal dialysis: Secondary | ICD-10-CM | POA: Diagnosis not present

## 2023-11-24 NOTE — Progress Notes (Unsigned)
 GI Office Note    Referring Provider: Tobi Fortes, MD Primary Care Physician:  Tobi Fortes, MD  Primary Gastroenterologist: Rheba Cedar, MD   Chief Complaint   No chief complaint on file.   History of Present Illness   [name] is a 58 y.o. male presenting today for follow up. Last seen 07/2023. He has had to be rescheduled colonoscopy twice now for failing to start clear liquid diet on time.Colonoscopy planned as part of a kidney transplant workup. Patient with history of end-stage renal disease on hemodialysis, hyperlipidemia, hypertension, diastolic heart failure, obstructive sleep apnea, type 2 diabetes mellitus. He has been on dialysis for nearly 2 years.         Medications   Current Outpatient Medications  Medication Sig Dispense Refill   atorvastatin  (LIPITOR) 40 MG tablet Take 1 tablet (40 mg total) by mouth daily. 90 tablet 3   GARLIC PO Take 1 capsule by mouth daily.     lanthanum (FOSRENOL) 1000 MG chewable tablet Chew 1,000 mg by mouth 3 (three) times daily.     Magnesium 400 MG CAPS Take by mouth.     Multiple Vitamin (MULTIVITAMIN WITH MINERALS) TABS tablet Take 1 tablet by mouth daily.     tirzepatide (MOUNJARO) 2.5 MG/0.5ML Pen Inject 2.5 mg into the skin once a week. 2 mL 0   vitamin C (ASCORBIC ACID) 500 MG tablet Take 500 mg by mouth daily.     Zinc 50 MG CAPS Take 50 mg by mouth every morning.     No current facility-administered medications for this visit.    Allergies   Allergies as of 11/26/2023   (No Known Allergies)     Past Medical History   Past Medical History:  Diagnosis Date   CHF (congestive heart failure) (HCC)    Diabetes mellitus    ESRD (end stage renal disease) (HCC)    Hypertension     Past Surgical History   Past Surgical History:  Procedure Laterality Date   A/V FISTULAGRAM Right 11/21/2021   Procedure: A/V Fistulagram;  Surgeon: Adine Hoof, MD;  Location: [facility] INVASIVE CV LAB;   Service: Cardiovascular;  Laterality: Right;   A/V FISTULAGRAM N/A 06/19/2023   Procedure: A/V Fistulagram;  Surgeon: Philipp Brawn, MD;  Location: [facility] INVASIVE CV LAB;  Service: Vascular;  Laterality: N/A;   AV FISTULA PLACEMENT Right 07/22/2021   Procedure: RIGHT  ARM FISTULA;  Surgeon: Margherita Shell, MD;  Location: [facility] OR;  Service: Vascular;  Laterality: Right;   INCISION AND DRAINAGE     INSERTION OF DIALYSIS CATHETER  07/22/2021   Procedure: INSERTION OF DIALYSIS CATHETER;  Surgeon: Margherita Shell, MD;  Location: [facility] OR;  Service: Vascular;;   KNEE SURGERY Left    PERIPHERAL VASCULAR BALLOON ANGIOPLASTY  06/19/2023   Procedure: PERIPHERAL VASCULAR BALLOON ANGIOPLASTY;  Surgeon: Philipp Brawn, MD;  Location: [facility] INVASIVE CV LAB;  Service: Vascular;;    Past Family History   Family History  Problem Relation Age of Onset   Dementia Mother    Diabetes Father    Cancer Maternal Uncle    Colon cancer Neg Hx     Past Social History   Social History   Socioeconomic History   Marital status: Married    Spouse name: Not on file   Number of children: Not on file   Years of education: Not on file   Highest education level: Not on file  Occupational  History   Not on file  Tobacco Use   Smoking status: Never   Smokeless tobacco: Never  Vaping Use   Vaping status: Never Used  Substance and Sexual Activity   Alcohol use: No   Drug use: No   Sexual activity: Not on file  Other Topics Concern   Not on file  Social History Narrative   Not on file   Social Drivers of Health   Financial Resource Strain: High Risk (11/29/2020)   Overall Financial Resource Strain (CARDIA)    Difficulty of Paying Living Expenses: Hard  Food Insecurity: Food Insecurity Present (12/29/2020)   Hunger Vital Sign    Worried About Running Out of Food in the Last Year: Sometimes true    Ran Out of Food in the Last Year: Never true  Transportation Needs: No Transportation Needs (11/29/2020)    PRAPARE - Administrator, Civil Service (Medical): No    Lack of Transportation (Non-Medical): No  Physical Activity: Not on file  Stress: Not on file  Social Connections: Not on file  Intimate Partner Violence: Not on file    Review of Systems   General: Negative for anorexia, weight loss, fever, chills, fatigue, weakness. ENT: Negative for hoarseness, difficulty swallowing , nasal congestion. CV: Negative for chest pain, angina, palpitations, dyspnea on exertion, peripheral edema.  Respiratory: Negative for dyspnea at rest, dyspnea on exertion, cough, sputum, wheezing.  GI: See history of present illness. GU:  Negative for dysuria, hematuria, urinary incontinence, urinary frequency, nocturnal urination.  Endo: Negative for unusual weight change.     Physical Exam   There were no vitals taken for this visit.   General: Well-nourished, well-developed in no acute distress.  Eyes: No icterus. Mouth: Oropharyngeal mucosa moist and pink , no lesions erythema or exudate. Lungs: Clear to auscultation bilaterally.  Heart: Regular rate and rhythm, no murmurs rubs or gallops.  Abdomen: Bowel sounds are normal, nontender, nondistended, no hepatosplenomegaly or masses,  no abdominal bruits or hernia , no rebound or guarding.  Rectal: ***  Extremities: No lower extremity edema. No clubbing or deformities. Neuro: Alert and oriented x 4   Skin: Warm and dry, no jaundice.   Psych: Alert and cooperative, normal mood and affect.  Labs   Lab Results  Component Value Date   NA 141 07/27/2023   CL 94 (L) 07/27/2023   K 4.4 07/27/2023   CO2 29 07/27/2023   BUN 60 (H) 07/27/2023   CREATININE 8.05 (H) 07/27/2023   EGFR 7 (L) 07/27/2023   CALCIUM  9.4 07/27/2023   PHOS 3.8 08/02/2021   ALBUMIN 4.1 07/27/2023   GLUCOSE 178 (H) 07/27/2023   Lab Results  Component Value Date   ALT 13 07/27/2023   AST 12 07/27/2023   ALKPHOS 90 07/27/2023   BILITOT 0.2 07/27/2023   Lab  Results  Component Value Date   WBC 6.7 07/27/2023   HGB 11.5 (L) 07/27/2023   HCT 35.3 (L) 07/27/2023   MCV 87 07/27/2023   PLT 269 07/27/2023   Lab Results  Component Value Date   TSH 1.020 07/27/2023   Lab Results  Component Value Date   HGBA1C 8.5 (H) 07/27/2023   Lab Results  Component Value Date   VITAMINB12 595 07/27/2023   Lab Results  Component Value Date   FOLATE 7.2 07/27/2023    Imaging Studies   No results found.  Assessment/Plan:     golytely    Trudie Fuse. Harles Lied, MHS, PA-C [facility]

## 2023-11-26 ENCOUNTER — Encounter: Payer: Self-pay | Admitting: Gastroenterology

## 2023-11-26 ENCOUNTER — Other Ambulatory Visit: Payer: Self-pay | Admitting: *Deleted

## 2023-11-26 ENCOUNTER — Ambulatory Visit: Admitting: Gastroenterology

## 2023-11-26 ENCOUNTER — Encounter: Payer: Self-pay | Admitting: *Deleted

## 2023-11-26 VITALS — BP 119/74 | HR 96 | Temp 98.1°F | Ht 71.0 in | Wt 292.8 lb

## 2023-11-26 DIAGNOSIS — E039 Hypothyroidism, unspecified: Secondary | ICD-10-CM | POA: Diagnosis not present

## 2023-11-26 DIAGNOSIS — N2581 Secondary hyperparathyroidism of renal origin: Secondary | ICD-10-CM | POA: Diagnosis not present

## 2023-11-26 DIAGNOSIS — E1122 Type 2 diabetes mellitus with diabetic chronic kidney disease: Secondary | ICD-10-CM | POA: Diagnosis not present

## 2023-11-26 DIAGNOSIS — N186 End stage renal disease: Secondary | ICD-10-CM | POA: Diagnosis not present

## 2023-11-26 DIAGNOSIS — E8770 Fluid overload, unspecified: Secondary | ICD-10-CM | POA: Diagnosis not present

## 2023-11-26 DIAGNOSIS — D631 Anemia in chronic kidney disease: Secondary | ICD-10-CM | POA: Diagnosis not present

## 2023-11-26 DIAGNOSIS — Z1211 Encounter for screening for malignant neoplasm of colon: Secondary | ICD-10-CM | POA: Diagnosis not present

## 2023-11-26 DIAGNOSIS — D509 Iron deficiency anemia, unspecified: Secondary | ICD-10-CM | POA: Diagnosis not present

## 2023-11-26 DIAGNOSIS — D689 Coagulation defect, unspecified: Secondary | ICD-10-CM | POA: Diagnosis not present

## 2023-11-26 DIAGNOSIS — Z992 Dependence on renal dialysis: Secondary | ICD-10-CM | POA: Diagnosis not present

## 2023-11-26 MED ORDER — PEG 3350-KCL-NA BICARB-NACL 420 G PO SOLR: 4000.0000 mL | Freq: Once | ORAL | 0 refills | Status: AC

## 2023-11-26 NOTE — Patient Instructions (Signed)
 We will set you up for another colonoscopy.  Please make sure you read your prep instructions the week before your procedure so you can remind yourself when you start the clear liquid diet this time!  Go ahead to pharmacy and pick up your prep as soon as you get scheduled.

## 2023-11-26 NOTE — Telephone Encounter (Signed)
 Pt has been scheduled for 01/02/24. Instructions mailed and prep sent to the pharmacy.

## 2023-11-27 DIAGNOSIS — D689 Coagulation defect, unspecified: Secondary | ICD-10-CM | POA: Diagnosis not present

## 2023-11-27 DIAGNOSIS — E8770 Fluid overload, unspecified: Secondary | ICD-10-CM | POA: Diagnosis not present

## 2023-11-27 DIAGNOSIS — D631 Anemia in chronic kidney disease: Secondary | ICD-10-CM | POA: Diagnosis not present

## 2023-11-27 DIAGNOSIS — Z992 Dependence on renal dialysis: Secondary | ICD-10-CM | POA: Diagnosis not present

## 2023-11-27 DIAGNOSIS — N186 End stage renal disease: Secondary | ICD-10-CM | POA: Diagnosis not present

## 2023-11-27 DIAGNOSIS — E1122 Type 2 diabetes mellitus with diabetic chronic kidney disease: Secondary | ICD-10-CM | POA: Diagnosis not present

## 2023-11-27 DIAGNOSIS — N2581 Secondary hyperparathyroidism of renal origin: Secondary | ICD-10-CM | POA: Diagnosis not present

## 2023-11-27 DIAGNOSIS — E039 Hypothyroidism, unspecified: Secondary | ICD-10-CM | POA: Diagnosis not present

## 2023-11-27 DIAGNOSIS — D509 Iron deficiency anemia, unspecified: Secondary | ICD-10-CM | POA: Diagnosis not present

## 2023-11-29 DIAGNOSIS — D509 Iron deficiency anemia, unspecified: Secondary | ICD-10-CM | POA: Diagnosis not present

## 2023-11-29 DIAGNOSIS — E1122 Type 2 diabetes mellitus with diabetic chronic kidney disease: Secondary | ICD-10-CM | POA: Diagnosis not present

## 2023-11-29 DIAGNOSIS — D631 Anemia in chronic kidney disease: Secondary | ICD-10-CM | POA: Diagnosis not present

## 2023-11-29 DIAGNOSIS — Z992 Dependence on renal dialysis: Secondary | ICD-10-CM | POA: Diagnosis not present

## 2023-11-29 DIAGNOSIS — N2581 Secondary hyperparathyroidism of renal origin: Secondary | ICD-10-CM | POA: Diagnosis not present

## 2023-11-29 DIAGNOSIS — E039 Hypothyroidism, unspecified: Secondary | ICD-10-CM | POA: Diagnosis not present

## 2023-11-29 DIAGNOSIS — E8770 Fluid overload, unspecified: Secondary | ICD-10-CM | POA: Diagnosis not present

## 2023-11-29 DIAGNOSIS — D689 Coagulation defect, unspecified: Secondary | ICD-10-CM | POA: Diagnosis not present

## 2023-11-29 DIAGNOSIS — N186 End stage renal disease: Secondary | ICD-10-CM | POA: Diagnosis not present

## 2023-11-30 ENCOUNTER — Ambulatory Visit (HOSPITAL_COMMUNITY)
Admission: RE | Admit: 2023-11-30 | Discharge: 2023-11-30 | Disposition: A | Discharge status: Home / Self Care | Attending: Nephrology | Admitting: Nephrology

## 2023-11-30 ENCOUNTER — Other Ambulatory Visit: Payer: Self-pay

## 2023-11-30 ENCOUNTER — Encounter (HOSPITAL_COMMUNITY)
Admission: RE | Disposition: A | Discharge status: Home / Self Care | Payer: Self-pay | Source: Home / Self Care | Attending: Nephrology

## 2023-11-30 DIAGNOSIS — N186 End stage renal disease: Secondary | ICD-10-CM | POA: Insufficient documentation

## 2023-11-30 DIAGNOSIS — I871 Compression of vein: Secondary | ICD-10-CM | POA: Insufficient documentation

## 2023-11-30 DIAGNOSIS — Y832 Surgical operation with anastomosis, bypass or graft as the cause of abnormal reaction of the patient, or of later complication, without mention of misadventure at the time of the procedure: Secondary | ICD-10-CM | POA: Insufficient documentation

## 2023-11-30 DIAGNOSIS — E1122 Type 2 diabetes mellitus with diabetic chronic kidney disease: Secondary | ICD-10-CM | POA: Diagnosis not present

## 2023-11-30 DIAGNOSIS — Z992 Dependence on renal dialysis: Secondary | ICD-10-CM | POA: Diagnosis not present

## 2023-11-30 DIAGNOSIS — I132 Hypertensive heart and chronic kidney disease with heart failure and with stage 5 chronic kidney disease, or end stage renal disease: Secondary | ICD-10-CM | POA: Insufficient documentation

## 2023-11-30 DIAGNOSIS — D631 Anemia in chronic kidney disease: Secondary | ICD-10-CM | POA: Diagnosis not present

## 2023-11-30 DIAGNOSIS — T82858A Stenosis of vascular prosthetic devices, implants and grafts, initial encounter: Secondary | ICD-10-CM | POA: Insufficient documentation

## 2023-11-30 DIAGNOSIS — I509 Heart failure, unspecified: Secondary | ICD-10-CM | POA: Insufficient documentation

## 2023-11-30 HISTORY — PX: A/V SHUNT INTERVENTION: CATH118220

## 2023-11-30 LAB — POCT I-STAT, CHEM 8
BUN: 39 mg/dL — ABNORMAL HIGH (ref 6–20)
Calcium, Ion: 1.19 mmol/L (ref 1.15–1.40)
Chloride: 97 mmol/L — ABNORMAL LOW (ref 98–111)
Creatinine, Ser: 7.6 mg/dL — ABNORMAL HIGH (ref 0.61–1.24)
Glucose, Bld: 132 mg/dL — ABNORMAL HIGH (ref 70–99)
HCT: 32 % — ABNORMAL LOW (ref 39.0–52.0)
Hemoglobin: 10.9 g/dL — ABNORMAL LOW (ref 13.0–17.0)
Potassium: 4.5 mmol/L (ref 3.5–5.1)
Sodium: 136 mmol/L (ref 135–145)
TCO2: 29 mmol/L (ref 22–32)

## 2023-11-30 SURGERY — A/V SHUNT INTERVENTION
Anesthesia: LOCAL

## 2023-11-30 MED ORDER — FENTANYL CITRATE (PF) 100 MCG/2ML IJ SOLN
INTRAMUSCULAR | Status: DC | PRN
  Administered 2023-11-30: 50 ug via INTRAVENOUS

## 2023-11-30 MED ORDER — IODIXANOL 320 MG/ML IV SOLN
INTRAVENOUS | Status: DC | PRN
  Administered 2023-11-30: 8 mL

## 2023-11-30 MED ORDER — FENTANYL CITRATE (PF) 100 MCG/2ML IJ SOLN
INTRAMUSCULAR | Status: AC
  Filled 2023-11-30: qty 2

## 2023-11-30 MED ORDER — LIDOCAINE HCL (PF) 1 % IJ SOLN
INTRAMUSCULAR | Status: DC | PRN
  Administered 2023-11-30: 5 mL

## 2023-11-30 MED ORDER — LIDOCAINE HCL (PF) 1 % IJ SOLN
INTRAMUSCULAR | Status: AC
  Filled 2023-11-30: qty 30

## 2023-11-30 MED ORDER — MIDAZOLAM HCL 2 MG/2ML IJ SOLN
INTRAMUSCULAR | Status: DC | PRN
  Administered 2023-11-30: 1 mg via INTRAVENOUS

## 2023-11-30 MED ORDER — MIDAZOLAM HCL 2 MG/2ML IJ SOLN
INTRAMUSCULAR | Status: AC
Start: 2023-11-30 — End: ?
  Filled 2023-11-30: qty 2

## 2023-11-30 MED ORDER — HEPARIN (PORCINE) IN NACL 1000-0.9 UT/500ML-% IV SOLN
INTRAVENOUS | Status: DC | PRN
  Administered 2023-11-30: 500 mL

## 2023-11-30 SURGICAL SUPPLY — 9 items
BAG SNAP BAND KOVER 36X36 (MISCELLANEOUS) ×1 IMPLANT
BALLOON MUSTANG 7.0X40 75 (BALLOONS) IMPLANT
CATH ANGIO 5F BER2 65CM (CATHETERS) IMPLANT
COVER DOME SNAP 22 D (MISCELLANEOUS) ×1 IMPLANT
GUIDEWIRE ANGLED .035X150CM (WIRE) IMPLANT
SHEATH PINNACLE 6F 10CM (SHEATH) IMPLANT
SHEATH PINNACLE R/O II 6F 4CM (SHEATH) IMPLANT
SHEATH PROBE COVER 6X72 (BAG) ×1 IMPLANT
TRAY PV CATH (CUSTOM PROCEDURE TRAY) ×1 IMPLANT

## 2023-11-30 NOTE — Op Note (Signed)
 Patient presents for concerns of flows in his right Cimino which was placed July 22, 2021 with last inflow 7 mm angioplasty on June 19, 2023.    On the physical exam, the fistula is hyperpulsatile at the inflow.  Summary:  1)      The patient had successful angioplasty (7 mm Mustang FE ~18 atm) of significant 50-60% stenosis in the inflow cephalic vein site.  2)      Radial artery, arterial anastomosis, body of the fistula, dual upper arm drainage and centrals were widely patent.   3)      This right Cimino remains amenable to future percutaneous intervention as long as it remains patent at least 3 months.  Description of procedure: The arm was prepped and draped in the usual sterile fashion. The right forearm Cimino fistula was cannulated (16109) with an 18G Angiocath needle directed in a retrograde direction in venous limb of the fistula. A guidewire was inserted and exchanged for a 6 Fr sheath. Contrast [phone]) injection via the side port of the sheath was performed. The angiogram of the fistula (09811) showed a patent outflow body of the cephalic fistula, dual upper arm drainage, arch, axillary vein, centrals.   The angled Glidewire was advanced and manipulated until the tip of the wire was in the proximal radial artery.  Arteriogram revealed a patent radial artery,  arterial anastomosis, 50-60% inflow cephalic vein stenosis. A 7 mm Mustang angioplasty balloon was then inserted over the guidewire and positioned at the cephalic vein inflow stenosis.   Venous angioplasty (91478) was carried out to 18 ATM with FULL effacement of the waist on the balloon at the cephalic vein lesion site. The repeat angiogram showed <30% residual stenosis with no evidence of extravasation or dissection with fairly rapid flows.    Hemostasis: A 3-0 ethilon purse string suture was placed at the cannulation site on removal of the sheath.  Sedation: 1 mg Versed , 50 mcg Fentanyl . Sedation time: 13   minutes  Contrast. 8 mL  Monitoring: Because of the patient's comorbid conditions and sedation during the procedure, continuous EKG monitoring and O2 saturation monitoring was performed throughout the procedure by the RN. There were no abnormal arrhythmias encountered.  Complications: None  Diagnoses: I87.1 Stricture of vein  N18.6 ESRD T82.858A Stricture of access  Procedure Coding:  [phone] Cannulation and angiogram of fistula, venous angioplasty (cephalic  vein inflow)  Z3086 Contrast  Recommendations:  1. Continue to cannulate the fistula with 15G needles.  2. Refer for problems with flows/swelling. 3. Remove the suture next treatment.   Discharge: The patient was discharged home in stable condition. The patient was given education regarding the care of the dialysis access AVF and specific instructions in case of any problems.

## 2023-11-30 NOTE — H&P (Addendum)
 Chief Complaint: Decreased flows  Interval H&P  The patient has presented today for an angiogram/ angioplasty.  Various methods of treatment have been discussed with the patient.  After consideration of risk, benefits and other options for treatment, the patient has consented to a angiogram/ angioplasty with  possible stent placement.   Risks of angiogram with potential angioplasty and stenting if needed.contrast reaction, extravasation/ bleeding, dissection, hypotension and death were explained to the patient.  The patient's history has been reviewed and the patient has been examined, no changes in status.  Stable for angiogram/angioplasty  I have reviewed the patient's chart and labs.  Questions were answered to the patient's satisfaction.  Assessment/Plan: ESRD dialyzing  TTS regimen  Decreased access flows right Cimino fistula placed July 22, 2021- planning on angiogram with possibly angioplasty; pulsatile approximately 2 inches above the anastomosis. Renal osteodystrophy - continue binders per home regimen. Anemia - managed with ESA's and IV iron at dialysis center. HTN - resume home regimen.   HPI: [name] is an 58 y.o. male history of CHF, hypertension, diabetes, heart failure, CHF, ESRD referred for decreased flows in a right Cimino fistula placed July 22, 2021.  ROS Per HPI.  Chemistry and CBC: Creatinine, Ser  Date/Time Value Ref Range Status  07/27/2023 09:01 AM 8.05 (H) 0.76 - 1.27 mg/dL Final  57/32/2025 42:70 AM 7.40 (H) 0.61 - 1.24 mg/dL Final  62/37/6283 15:17 AM 4.90 (H) 0.61 - 1.24 mg/dL Final  61/60/7371 06:26 AM 6.31 (H) 0.61 - 1.24 mg/dL Final  94/85/4627 03:50 AM 5.89 (H) 0.61 - 1.24 mg/dL Final  09/38/1829 93:71 AM 4.61 (H) 0.61 - 1.24 mg/dL Final  69/67/8938 10:17 AM 5.37 (H) 0.61 - 1.24 mg/dL Final  51/08/5850 77:82 AM 4.38 (H) 0.61 - 1.24 mg/dL Final  42/35/3614 43:15 AM 5.62 (H) 0.61 - 1.24 mg/dL Final  40/02/6760 95:09 AM 5.15 (H) 0.61  - 1.24 mg/dL Final  32/67/1245 80:99 AM 6.55 (H) 0.61 - 1.24 mg/dL Final  83/38/2505 39:76 AM 6.12 (H) 0.61 - 1.24 mg/dL Final  73/41/9379 02:40 AM 6.93 (H) 0.61 - 1.24 mg/dL Final  97/35/3299 24:26 AM 6.31 (H) 0.61 - 1.24 mg/dL Final  83/41/9622 29:79 AM 5.90 (H) 0.61 - 1.24 mg/dL Final  89/21/1941 74:08 AM 5.81 (H) 0.61 - 1.24 mg/dL Final  14/48/1856 31:49 AM 5.51 (H) 0.61 - 1.24 mg/dL Final  70/26/3785 88:50 AM 5.25 (H) 0.61 - 1.24 mg/dL Final  27/74/1287 86:76 AM 4.91 (H) 0.61 - 1.24 mg/dL Final  72/03/4708 62:83 AM 4.31 (H) 0.61 - 1.24 mg/dL Final  66/29/4765 46:50 AM 4.30 (H) 0.61 - 1.24 mg/dL Final  35/46/5681 27:51 PM 4.15 (H) 0.61 - 1.24 mg/dL Final  70/07/7492 49:67 AM 4.08 (H) 0.61 - 1.24 mg/dL Final  59/16/3846 65:99 PM 4.18 (H) 0.61 - 1.24 mg/dL Final  35/70/1779 39:03 AM 3.74 (H) 0.61 - 1.24 mg/dL Final  00/92/3300 76:22 AM 3.97 (H) 0.61 - 1.24 mg/dL Final  63/33/5456 25:63 AM 5.16 (H) 0.61 - 1.24 mg/dL Final  89/37/3428 76:81 AM 4.96 (H) 0.61 - 1.24 mg/dL Final  15/72/6203 55:97 AM 4.86 (H) 0.61 - 1.24 mg/dL Final  41/63/8453 64:68 AM 4.99 (H) 0.61 - 1.24 mg/dL Final  10/12/2246 25:00 AM 4.46 (H) 0.61 - 1.24 mg/dL Final  37/10/8887 16:94 AM 3.59 (H) 0.61 - 1.24 mg/dL Final  50/38/8828 00:34 AM 3.36 (H) 0.61 - 1.24 mg/dL Final  91/79/1505 69:79 AM 3.31 (H) 0.61 - 1.24 mg/dL Final  48/07/6551 74:82 AM 3.07 (H) 0.61 -  1.24 mg/dL Final  40/98/1191 47:82 AM 4.20 (H) 0.61 - 1.24 mg/dL Final  95/62/1308 65:78 AM 4.06 (H) 0.61 - 1.24 mg/dL Final  46/96/2952 84:13 AM 4.40 (H) 0.61 - 1.24 mg/dL Final  24/40/1027 25:36 AM 2.76 (H) 0.61 - 1.24 mg/dL Final  64/40/3474 25:95 AM 2.65 (H) 0.61 - 1.24 mg/dL Final  63/87/5643 32:95 PM 2.78 (H) 0.61 - 1.24 mg/dL Final  18/84/1660 63:01 AM 2.30 (H) 0.61 - 1.24 mg/dL Final  60/04/9322 55:73 AM 2.93 (H) 0.61 - 1.24 mg/dL Final  22/08/5425 06:23 AM 2.74 (H) 0.61 - 1.24 mg/dL Final  76/28/3151 76:16 AM 2.86 (H) 0.61 - 1.24 mg/dL Final   07/37/1062 69:48 PM 2.90 (H) 0.61 - 1.24 mg/dL Final  54/62/7035 00:93 PM 1.95 (H) 0.61 - 1.24 mg/dL Final  81/82/9937 16:96 PM 2.43 (H) 0.61 - 1.24 mg/dL Final  78/93/8101 75:10 AM 2.10 (H) 0.61 - 1.24 mg/dL Final  25/85/2778 24:23 AM 2.27 (H) 0.61 - 1.24 mg/dL Final  53/61/4431 54:00 AM 2.26 (H) 0.61 - 1.24 mg/dL Final  86/76/1950 93:26 AM 2.16 (H) 0.61 - 1.24 mg/dL Final   No results for input(s): "NA", "K", "CL", "CO2", "GLUCOSE", "BUN", "CREATININE", "CALCIUM ", "PHOS" in the last 168 hours.  Invalid input(s): "ALB" No results for input(s): "WBC", "NEUTROABS", "HGB", "HCT", "MCV", "PLT" in the last 168 hours. Liver Function Tests: No results for input(s): "AST", "ALT", "ALKPHOS", "BILITOT", "PROT", "ALBUMIN" in the last 168 hours. No results for input(s): "LIPASE", "AMYLASE" in the last 168 hours. No results for input(s): "AMMONIA" in the last 168 hours. Cardiac Enzymes: No results for input(s): "CKTOTAL", "CKMB", "CKMBINDEX", "TROPONINI" in the last 168 hours. Iron Studies: No results for input(s): "IRON", "TIBC", "TRANSFERRIN", "FERRITIN" in the last 72 hours. PT/INR: @LABRCNTIP (inr:5)  Xrays/Other Studies: )No results found for this or any previous visit (from the past 48 hours). No results found.  PMH:   Past Medical History:  Diagnosis Date   CHF (congestive heart failure) (HCC)    Diabetes mellitus    ESRD (end stage renal disease) (HCC)    Hypertension     PSH:   Past Surgical History:  Procedure Laterality Date   A/V FISTULAGRAM Right 11/21/2021   Procedure: A/V Fistulagram;  Surgeon: Adine Hoof, MD;  Location: [facility] INVASIVE CV LAB;  Service: Cardiovascular;  Laterality: Right;   A/V FISTULAGRAM N/A 06/19/2023   Procedure: A/V Fistulagram;  Surgeon: Philipp Brawn, MD;  Location: [facility] INVASIVE CV LAB;  Service: Vascular;  Laterality: N/A;   AV FISTULA PLACEMENT Right 07/22/2021   Procedure: RIGHT  ARM FISTULA;  Surgeon: Margherita Shell, MD;   Location: [facility] OR;  Service: Vascular;  Laterality: Right;   INCISION AND DRAINAGE     INSERTION OF DIALYSIS CATHETER  07/22/2021   Procedure: INSERTION OF DIALYSIS CATHETER;  Surgeon: Margherita Shell, MD;  Location: [facility] OR;  Service: Vascular;;   KNEE SURGERY Left    PERIPHERAL VASCULAR BALLOON ANGIOPLASTY  06/19/2023   Procedure: PERIPHERAL VASCULAR BALLOON ANGIOPLASTY;  Surgeon: Philipp Brawn, MD;  Location: [facility] INVASIVE CV LAB;  Service: Vascular;;    Allergies: No Known Allergies  Medications:   Prior to Admission medications   Medication Sig Start Date End Date Taking? Authorizing Provider  atorvastatin  (LIPITOR) 40 MG tablet Take 1 tablet (40 mg total) by mouth daily. 11/02/23   Dixon, Phillip E, MD  GARLIC PO Take 1 capsule by mouth daily.    [provider]  Magnesium 400 MG CAPS Take by  mouth.    [provider]  Multiple Vitamin (MULTIVITAMIN WITH MINERALS) TABS tablet Take 1 tablet by mouth daily.    [provider]  VELPHORO 500 MG chewable tablet Chew 2 tablets by mouth 3 (three) times daily with meals. 09/04/23 09/03/24  [provider]  vitamin C (ASCORBIC ACID) 500 MG tablet Take 500 mg by mouth daily.    [provider]  Zinc 50 MG CAPS Take 50 mg by mouth every morning.    [provider]    Discontinued Meds:  There are no discontinued medications.  Social History:  reports that he has never smoked. He has never used smokeless tobacco. He reports that he does not drink alcohol and does not use drugs.  Family History:   Family History  Problem Relation Age of Onset   Dementia Mother    Diabetes Father    Cancer Maternal Uncle    Colon cancer Neg Hx     Blood pressure (!) 151/84, pulse 92, resp. rate 12, SpO2 97%. GEN: NAD, A&Ox3, NCAT HEENT: No conjunctival pallor, EOMI NECK: Supple, no thyromegaly LUNGS: CTA B/L no rales, rhonchi or wheezing CV: RRR, No M/R/G ABD: SNDNT +BS  EXT: No lower extremity  edema ACCESS: rt cimino pulsatile at the inflow segment       Patrick Boor, MD 11/30/2023, 12:37 PM

## 2023-11-30 NOTE — Discharge Instructions (Signed)

## 2023-12-01 ENCOUNTER — Encounter (HOSPITAL_COMMUNITY): Payer: Self-pay | Admitting: Nephrology

## 2023-12-01 DIAGNOSIS — E8770 Fluid overload, unspecified: Secondary | ICD-10-CM | POA: Diagnosis not present

## 2023-12-01 DIAGNOSIS — D689 Coagulation defect, unspecified: Secondary | ICD-10-CM | POA: Diagnosis not present

## 2023-12-01 DIAGNOSIS — N2581 Secondary hyperparathyroidism of renal origin: Secondary | ICD-10-CM | POA: Diagnosis not present

## 2023-12-01 DIAGNOSIS — D631 Anemia in chronic kidney disease: Secondary | ICD-10-CM | POA: Diagnosis not present

## 2023-12-01 DIAGNOSIS — D509 Iron deficiency anemia, unspecified: Secondary | ICD-10-CM | POA: Diagnosis not present

## 2023-12-01 DIAGNOSIS — N186 End stage renal disease: Secondary | ICD-10-CM | POA: Diagnosis not present

## 2023-12-01 DIAGNOSIS — E039 Hypothyroidism, unspecified: Secondary | ICD-10-CM | POA: Diagnosis not present

## 2023-12-01 DIAGNOSIS — E1122 Type 2 diabetes mellitus with diabetic chronic kidney disease: Secondary | ICD-10-CM | POA: Diagnosis not present

## 2023-12-01 DIAGNOSIS — Z992 Dependence on renal dialysis: Secondary | ICD-10-CM | POA: Diagnosis not present

## 2023-12-03 MED FILL — Verapamil HCl IV Soln 2.5 MG/ML: INTRAVENOUS | Qty: 2 | Status: AC

## 2023-12-03 MED FILL — Heparin Sodium (Porcine) Inj 1000 Unit/ML: INTRAMUSCULAR | Qty: 10 | Status: AC

## 2023-12-04 DIAGNOSIS — E039 Hypothyroidism, unspecified: Secondary | ICD-10-CM | POA: Diagnosis not present

## 2023-12-04 DIAGNOSIS — E1122 Type 2 diabetes mellitus with diabetic chronic kidney disease: Secondary | ICD-10-CM | POA: Diagnosis not present

## 2023-12-04 DIAGNOSIS — Z992 Dependence on renal dialysis: Secondary | ICD-10-CM | POA: Diagnosis not present

## 2023-12-04 DIAGNOSIS — N2581 Secondary hyperparathyroidism of renal origin: Secondary | ICD-10-CM | POA: Diagnosis not present

## 2023-12-04 DIAGNOSIS — E8770 Fluid overload, unspecified: Secondary | ICD-10-CM | POA: Diagnosis not present

## 2023-12-04 DIAGNOSIS — D509 Iron deficiency anemia, unspecified: Secondary | ICD-10-CM | POA: Diagnosis not present

## 2023-12-04 DIAGNOSIS — N186 End stage renal disease: Secondary | ICD-10-CM | POA: Diagnosis not present

## 2023-12-04 DIAGNOSIS — D689 Coagulation defect, unspecified: Secondary | ICD-10-CM | POA: Diagnosis not present

## 2023-12-04 DIAGNOSIS — D631 Anemia in chronic kidney disease: Secondary | ICD-10-CM | POA: Diagnosis not present

## 2023-12-06 DIAGNOSIS — D631 Anemia in chronic kidney disease: Secondary | ICD-10-CM | POA: Diagnosis not present

## 2023-12-06 DIAGNOSIS — Z992 Dependence on renal dialysis: Secondary | ICD-10-CM | POA: Diagnosis not present

## 2023-12-06 DIAGNOSIS — N186 End stage renal disease: Secondary | ICD-10-CM | POA: Diagnosis not present

## 2023-12-06 DIAGNOSIS — E8770 Fluid overload, unspecified: Secondary | ICD-10-CM | POA: Diagnosis not present

## 2023-12-06 DIAGNOSIS — E1122 Type 2 diabetes mellitus with diabetic chronic kidney disease: Secondary | ICD-10-CM | POA: Diagnosis not present

## 2023-12-06 DIAGNOSIS — D509 Iron deficiency anemia, unspecified: Secondary | ICD-10-CM | POA: Diagnosis not present

## 2023-12-06 DIAGNOSIS — D689 Coagulation defect, unspecified: Secondary | ICD-10-CM | POA: Diagnosis not present

## 2023-12-06 DIAGNOSIS — E039 Hypothyroidism, unspecified: Secondary | ICD-10-CM | POA: Diagnosis not present

## 2023-12-06 DIAGNOSIS — N2581 Secondary hyperparathyroidism of renal origin: Secondary | ICD-10-CM | POA: Diagnosis not present

## 2023-12-08 DIAGNOSIS — E1122 Type 2 diabetes mellitus with diabetic chronic kidney disease: Secondary | ICD-10-CM | POA: Diagnosis not present

## 2023-12-08 DIAGNOSIS — D509 Iron deficiency anemia, unspecified: Secondary | ICD-10-CM | POA: Diagnosis not present

## 2023-12-08 DIAGNOSIS — N2581 Secondary hyperparathyroidism of renal origin: Secondary | ICD-10-CM | POA: Diagnosis not present

## 2023-12-08 DIAGNOSIS — E8770 Fluid overload, unspecified: Secondary | ICD-10-CM | POA: Diagnosis not present

## 2023-12-08 DIAGNOSIS — D631 Anemia in chronic kidney disease: Secondary | ICD-10-CM | POA: Diagnosis not present

## 2023-12-08 DIAGNOSIS — E039 Hypothyroidism, unspecified: Secondary | ICD-10-CM | POA: Diagnosis not present

## 2023-12-08 DIAGNOSIS — D689 Coagulation defect, unspecified: Secondary | ICD-10-CM | POA: Diagnosis not present

## 2023-12-08 DIAGNOSIS — N186 End stage renal disease: Secondary | ICD-10-CM | POA: Diagnosis not present

## 2023-12-08 DIAGNOSIS — Z992 Dependence on renal dialysis: Secondary | ICD-10-CM | POA: Diagnosis not present

## 2023-12-11 DIAGNOSIS — E8770 Fluid overload, unspecified: Secondary | ICD-10-CM | POA: Diagnosis not present

## 2023-12-11 DIAGNOSIS — D509 Iron deficiency anemia, unspecified: Secondary | ICD-10-CM | POA: Diagnosis not present

## 2023-12-11 DIAGNOSIS — D689 Coagulation defect, unspecified: Secondary | ICD-10-CM | POA: Diagnosis not present

## 2023-12-11 DIAGNOSIS — D631 Anemia in chronic kidney disease: Secondary | ICD-10-CM | POA: Diagnosis not present

## 2023-12-11 DIAGNOSIS — N2581 Secondary hyperparathyroidism of renal origin: Secondary | ICD-10-CM | POA: Diagnosis not present

## 2023-12-11 DIAGNOSIS — Z992 Dependence on renal dialysis: Secondary | ICD-10-CM | POA: Diagnosis not present

## 2023-12-11 DIAGNOSIS — E039 Hypothyroidism, unspecified: Secondary | ICD-10-CM | POA: Diagnosis not present

## 2023-12-11 DIAGNOSIS — E1122 Type 2 diabetes mellitus with diabetic chronic kidney disease: Secondary | ICD-10-CM | POA: Diagnosis not present

## 2023-12-11 DIAGNOSIS — N186 End stage renal disease: Secondary | ICD-10-CM | POA: Diagnosis not present

## 2023-12-13 DIAGNOSIS — D689 Coagulation defect, unspecified: Secondary | ICD-10-CM | POA: Diagnosis not present

## 2023-12-13 DIAGNOSIS — E8770 Fluid overload, unspecified: Secondary | ICD-10-CM | POA: Diagnosis not present

## 2023-12-13 DIAGNOSIS — N2581 Secondary hyperparathyroidism of renal origin: Secondary | ICD-10-CM | POA: Diagnosis not present

## 2023-12-13 DIAGNOSIS — Z992 Dependence on renal dialysis: Secondary | ICD-10-CM | POA: Diagnosis not present

## 2023-12-13 DIAGNOSIS — N186 End stage renal disease: Secondary | ICD-10-CM | POA: Diagnosis not present

## 2023-12-13 DIAGNOSIS — E1122 Type 2 diabetes mellitus with diabetic chronic kidney disease: Secondary | ICD-10-CM | POA: Diagnosis not present

## 2023-12-13 DIAGNOSIS — D631 Anemia in chronic kidney disease: Secondary | ICD-10-CM | POA: Diagnosis not present

## 2023-12-13 DIAGNOSIS — E039 Hypothyroidism, unspecified: Secondary | ICD-10-CM | POA: Diagnosis not present

## 2023-12-13 DIAGNOSIS — D509 Iron deficiency anemia, unspecified: Secondary | ICD-10-CM | POA: Diagnosis not present

## 2023-12-15 DIAGNOSIS — E8770 Fluid overload, unspecified: Secondary | ICD-10-CM | POA: Diagnosis not present

## 2023-12-15 DIAGNOSIS — N186 End stage renal disease: Secondary | ICD-10-CM | POA: Diagnosis not present

## 2023-12-15 DIAGNOSIS — D509 Iron deficiency anemia, unspecified: Secondary | ICD-10-CM | POA: Diagnosis not present

## 2023-12-15 DIAGNOSIS — Z992 Dependence on renal dialysis: Secondary | ICD-10-CM | POA: Diagnosis not present

## 2023-12-15 DIAGNOSIS — E039 Hypothyroidism, unspecified: Secondary | ICD-10-CM | POA: Diagnosis not present

## 2023-12-15 DIAGNOSIS — D689 Coagulation defect, unspecified: Secondary | ICD-10-CM | POA: Diagnosis not present

## 2023-12-15 DIAGNOSIS — E1122 Type 2 diabetes mellitus with diabetic chronic kidney disease: Secondary | ICD-10-CM | POA: Diagnosis not present

## 2023-12-15 DIAGNOSIS — N2581 Secondary hyperparathyroidism of renal origin: Secondary | ICD-10-CM | POA: Diagnosis not present

## 2023-12-15 DIAGNOSIS — D631 Anemia in chronic kidney disease: Secondary | ICD-10-CM | POA: Diagnosis not present

## 2023-12-18 DIAGNOSIS — N186 End stage renal disease: Secondary | ICD-10-CM | POA: Diagnosis not present

## 2023-12-18 DIAGNOSIS — E1122 Type 2 diabetes mellitus with diabetic chronic kidney disease: Secondary | ICD-10-CM | POA: Diagnosis not present

## 2023-12-18 DIAGNOSIS — Z992 Dependence on renal dialysis: Secondary | ICD-10-CM | POA: Diagnosis not present

## 2023-12-18 DIAGNOSIS — N2581 Secondary hyperparathyroidism of renal origin: Secondary | ICD-10-CM | POA: Diagnosis not present

## 2023-12-18 DIAGNOSIS — D509 Iron deficiency anemia, unspecified: Secondary | ICD-10-CM | POA: Diagnosis not present

## 2023-12-18 DIAGNOSIS — D689 Coagulation defect, unspecified: Secondary | ICD-10-CM | POA: Diagnosis not present

## 2023-12-18 DIAGNOSIS — D631 Anemia in chronic kidney disease: Secondary | ICD-10-CM | POA: Diagnosis not present

## 2023-12-18 DIAGNOSIS — E8770 Fluid overload, unspecified: Secondary | ICD-10-CM | POA: Diagnosis not present

## 2023-12-18 DIAGNOSIS — E039 Hypothyroidism, unspecified: Secondary | ICD-10-CM | POA: Diagnosis not present

## 2023-12-20 DIAGNOSIS — D689 Coagulation defect, unspecified: Secondary | ICD-10-CM | POA: Diagnosis not present

## 2023-12-20 DIAGNOSIS — D509 Iron deficiency anemia, unspecified: Secondary | ICD-10-CM | POA: Diagnosis not present

## 2023-12-20 DIAGNOSIS — E8770 Fluid overload, unspecified: Secondary | ICD-10-CM | POA: Diagnosis not present

## 2023-12-20 DIAGNOSIS — E1122 Type 2 diabetes mellitus with diabetic chronic kidney disease: Secondary | ICD-10-CM | POA: Diagnosis not present

## 2023-12-20 DIAGNOSIS — N2581 Secondary hyperparathyroidism of renal origin: Secondary | ICD-10-CM | POA: Diagnosis not present

## 2023-12-20 DIAGNOSIS — N186 End stage renal disease: Secondary | ICD-10-CM | POA: Diagnosis not present

## 2023-12-20 DIAGNOSIS — Z992 Dependence on renal dialysis: Secondary | ICD-10-CM | POA: Diagnosis not present

## 2023-12-20 DIAGNOSIS — E039 Hypothyroidism, unspecified: Secondary | ICD-10-CM | POA: Diagnosis not present

## 2023-12-20 DIAGNOSIS — D631 Anemia in chronic kidney disease: Secondary | ICD-10-CM | POA: Diagnosis not present

## 2023-12-22 DIAGNOSIS — E1122 Type 2 diabetes mellitus with diabetic chronic kidney disease: Secondary | ICD-10-CM | POA: Diagnosis not present

## 2023-12-22 DIAGNOSIS — E8770 Fluid overload, unspecified: Secondary | ICD-10-CM | POA: Diagnosis not present

## 2023-12-22 DIAGNOSIS — Z992 Dependence on renal dialysis: Secondary | ICD-10-CM | POA: Diagnosis not present

## 2023-12-22 DIAGNOSIS — D509 Iron deficiency anemia, unspecified: Secondary | ICD-10-CM | POA: Diagnosis not present

## 2023-12-22 DIAGNOSIS — N2581 Secondary hyperparathyroidism of renal origin: Secondary | ICD-10-CM | POA: Diagnosis not present

## 2023-12-22 DIAGNOSIS — D689 Coagulation defect, unspecified: Secondary | ICD-10-CM | POA: Diagnosis not present

## 2023-12-22 DIAGNOSIS — D631 Anemia in chronic kidney disease: Secondary | ICD-10-CM | POA: Diagnosis not present

## 2023-12-22 DIAGNOSIS — E039 Hypothyroidism, unspecified: Secondary | ICD-10-CM | POA: Diagnosis not present

## 2023-12-22 DIAGNOSIS — N186 End stage renal disease: Secondary | ICD-10-CM | POA: Diagnosis not present

## 2023-12-25 DIAGNOSIS — N186 End stage renal disease: Secondary | ICD-10-CM | POA: Diagnosis not present

## 2023-12-25 DIAGNOSIS — E1122 Type 2 diabetes mellitus with diabetic chronic kidney disease: Secondary | ICD-10-CM | POA: Diagnosis not present

## 2023-12-25 DIAGNOSIS — Z992 Dependence on renal dialysis: Secondary | ICD-10-CM | POA: Diagnosis not present

## 2023-12-25 DIAGNOSIS — N2581 Secondary hyperparathyroidism of renal origin: Secondary | ICD-10-CM | POA: Diagnosis not present

## 2023-12-25 DIAGNOSIS — D689 Coagulation defect, unspecified: Secondary | ICD-10-CM | POA: Diagnosis not present

## 2023-12-25 DIAGNOSIS — D509 Iron deficiency anemia, unspecified: Secondary | ICD-10-CM | POA: Diagnosis not present

## 2023-12-25 DIAGNOSIS — D631 Anemia in chronic kidney disease: Secondary | ICD-10-CM | POA: Diagnosis not present

## 2023-12-27 DIAGNOSIS — D509 Iron deficiency anemia, unspecified: Secondary | ICD-10-CM | POA: Diagnosis not present

## 2023-12-27 DIAGNOSIS — N2581 Secondary hyperparathyroidism of renal origin: Secondary | ICD-10-CM | POA: Diagnosis not present

## 2023-12-27 DIAGNOSIS — D631 Anemia in chronic kidney disease: Secondary | ICD-10-CM | POA: Diagnosis not present

## 2023-12-27 DIAGNOSIS — D689 Coagulation defect, unspecified: Secondary | ICD-10-CM | POA: Diagnosis not present

## 2023-12-27 DIAGNOSIS — N186 End stage renal disease: Secondary | ICD-10-CM | POA: Diagnosis not present

## 2023-12-27 DIAGNOSIS — E1122 Type 2 diabetes mellitus with diabetic chronic kidney disease: Secondary | ICD-10-CM | POA: Diagnosis not present

## 2023-12-27 DIAGNOSIS — Z992 Dependence on renal dialysis: Secondary | ICD-10-CM | POA: Diagnosis not present

## 2023-12-29 DIAGNOSIS — Z992 Dependence on renal dialysis: Secondary | ICD-10-CM | POA: Diagnosis not present

## 2023-12-29 DIAGNOSIS — D509 Iron deficiency anemia, unspecified: Secondary | ICD-10-CM | POA: Diagnosis not present

## 2023-12-29 DIAGNOSIS — E1122 Type 2 diabetes mellitus with diabetic chronic kidney disease: Secondary | ICD-10-CM | POA: Diagnosis not present

## 2023-12-29 DIAGNOSIS — D631 Anemia in chronic kidney disease: Secondary | ICD-10-CM | POA: Diagnosis not present

## 2023-12-29 DIAGNOSIS — D689 Coagulation defect, unspecified: Secondary | ICD-10-CM | POA: Diagnosis not present

## 2023-12-29 DIAGNOSIS — N2581 Secondary hyperparathyroidism of renal origin: Secondary | ICD-10-CM | POA: Diagnosis not present

## 2023-12-29 DIAGNOSIS — N186 End stage renal disease: Secondary | ICD-10-CM | POA: Diagnosis not present

## 2023-12-31 ENCOUNTER — Other Ambulatory Visit: Payer: Self-pay

## 2023-12-31 ENCOUNTER — Encounter (HOSPITAL_COMMUNITY): Payer: Self-pay

## 2023-12-31 ENCOUNTER — Encounter (HOSPITAL_COMMUNITY)
Admission: RE | Admit: 2023-12-31 | Discharge: 2023-12-31 | Disposition: A | Discharge status: Home / Self Care | Source: Ambulatory Visit | Attending: Internal Medicine | Admitting: Internal Medicine

## 2023-12-31 VITALS — Ht 71.0 in | Wt 274.0 lb

## 2023-12-31 DIAGNOSIS — E1121 Type 2 diabetes mellitus with diabetic nephropathy: Secondary | ICD-10-CM

## 2023-12-31 DIAGNOSIS — N186 End stage renal disease: Secondary | ICD-10-CM

## 2023-12-31 DIAGNOSIS — I10 Essential (primary) hypertension: Secondary | ICD-10-CM

## 2023-12-31 NOTE — Pre-Procedure Instructions (Signed)
 Attempted pre-op phone call. Left VM for him to call us back.

## 2024-01-01 DIAGNOSIS — D689 Coagulation defect, unspecified: Secondary | ICD-10-CM | POA: Diagnosis not present

## 2024-01-01 DIAGNOSIS — D509 Iron deficiency anemia, unspecified: Secondary | ICD-10-CM | POA: Diagnosis not present

## 2024-01-01 DIAGNOSIS — D631 Anemia in chronic kidney disease: Secondary | ICD-10-CM | POA: Diagnosis not present

## 2024-01-01 DIAGNOSIS — Z992 Dependence on renal dialysis: Secondary | ICD-10-CM | POA: Diagnosis not present

## 2024-01-01 DIAGNOSIS — N186 End stage renal disease: Secondary | ICD-10-CM | POA: Diagnosis not present

## 2024-01-01 DIAGNOSIS — E1122 Type 2 diabetes mellitus with diabetic chronic kidney disease: Secondary | ICD-10-CM | POA: Diagnosis not present

## 2024-01-01 DIAGNOSIS — N2581 Secondary hyperparathyroidism of renal origin: Secondary | ICD-10-CM | POA: Diagnosis not present

## 2024-01-01 NOTE — Anesthesia Preprocedure Evaluation (Signed)
 Anesthesia Evaluation  Patient identified by MRN, date of birth, ID band Patient awake    Reviewed: Allergy & Precautions, NPO status , Patient's Chart, lab work & pertinent test results  Airway Mallampati: III  TM Distance: >3 FB Neck ROM: Full    Dental no notable dental hx. (+) Teeth Intact, Dental Advisory Given   Pulmonary    breath sounds clear to auscultation       Cardiovascular hypertension, Pt. on medications and Pt. on home beta blockers +CHF  Normal cardiovascular exam Rhythm:Regular Rate:Normal  Good EF. Grade 1 diastolic dysfunction   Neuro/Psych negative neurological ROS  negative psych ROS   GI/Hepatic negative GI ROS, Neg liver ROS,,,  Endo/Other  diabetes, Type 2, Oral Hypoglycemic Agents    Renal/GU ESRFRenal disease     Musculoskeletal negative musculoskeletal ROS (+)    Abdominal Normal abdominal exam  (+)   Peds  Hematology negative hematology ROS (+)   Anesthesia Other Findings   Reproductive/Obstetrics                             Anesthesia Physical Anesthesia Plan  ASA: 3  Anesthesia Plan: General   Post-op Pain Management: Minimal or no pain anticipated   Induction: Intravenous  PONV Risk Score and Plan: Propofol  infusion  Airway Management Planned: Nasal Cannula and Natural Airway  Additional Equipment: None  Intra-op Plan:   Post-operative Plan:   Informed Consent: I have reviewed the patients History and Physical, chart, labs and discussed the procedure including the risks, benefits and alternatives for the proposed anesthesia with the patient or authorized representative who has indicated his/her understanding and acceptance.     Dental advisory given  Plan Discussed with: CRNA  Anesthesia Plan Comments:         Anesthesia Quick Evaluation

## 2024-01-02 ENCOUNTER — Ambulatory Visit (HOSPITAL_COMMUNITY)
Admission: RE | Admit: 2024-01-02 | Discharge: 2024-01-02 | Disposition: A | Discharge status: Home / Self Care | Attending: Internal Medicine | Admitting: Internal Medicine

## 2024-01-02 ENCOUNTER — Encounter (HOSPITAL_COMMUNITY): Payer: Self-pay | Admitting: Internal Medicine

## 2024-01-02 ENCOUNTER — Encounter (HOSPITAL_COMMUNITY)
Admission: RE | Disposition: A | Discharge status: Home / Self Care | Payer: Self-pay | Source: Home / Self Care | Attending: Internal Medicine

## 2024-01-02 ENCOUNTER — Ambulatory Visit (HOSPITAL_COMMUNITY): Payer: Self-pay | Admitting: Anesthesiology

## 2024-01-02 ENCOUNTER — Ambulatory Visit (HOSPITAL_BASED_OUTPATIENT_CLINIC_OR_DEPARTMENT_OTHER): Payer: Self-pay | Admitting: Anesthesiology

## 2024-01-02 DIAGNOSIS — Z992 Dependence on renal dialysis: Secondary | ICD-10-CM | POA: Diagnosis not present

## 2024-01-02 DIAGNOSIS — Z1211 Encounter for screening for malignant neoplasm of colon: Secondary | ICD-10-CM

## 2024-01-02 DIAGNOSIS — K635 Polyp of colon: Secondary | ICD-10-CM | POA: Diagnosis not present

## 2024-01-02 DIAGNOSIS — I132 Hypertensive heart and chronic kidney disease with heart failure and with stage 5 chronic kidney disease, or end stage renal disease: Secondary | ICD-10-CM | POA: Insufficient documentation

## 2024-01-02 DIAGNOSIS — Z7984 Long term (current) use of oral hypoglycemic drugs: Secondary | ICD-10-CM | POA: Insufficient documentation

## 2024-01-02 DIAGNOSIS — D123 Benign neoplasm of transverse colon: Secondary | ICD-10-CM

## 2024-01-02 DIAGNOSIS — I10 Essential (primary) hypertension: Secondary | ICD-10-CM

## 2024-01-02 DIAGNOSIS — I5032 Chronic diastolic (congestive) heart failure: Secondary | ICD-10-CM | POA: Insufficient documentation

## 2024-01-02 DIAGNOSIS — I509 Heart failure, unspecified: Secondary | ICD-10-CM | POA: Diagnosis not present

## 2024-01-02 DIAGNOSIS — D125 Benign neoplasm of sigmoid colon: Secondary | ICD-10-CM

## 2024-01-02 DIAGNOSIS — E1121 Type 2 diabetes mellitus with diabetic nephropathy: Secondary | ICD-10-CM

## 2024-01-02 DIAGNOSIS — E1122 Type 2 diabetes mellitus with diabetic chronic kidney disease: Secondary | ICD-10-CM | POA: Diagnosis not present

## 2024-01-02 DIAGNOSIS — N186 End stage renal disease: Secondary | ICD-10-CM | POA: Insufficient documentation

## 2024-01-02 DIAGNOSIS — Z139 Encounter for screening, unspecified: Secondary | ICD-10-CM | POA: Diagnosis not present

## 2024-01-02 HISTORY — PX: COLONOSCOPY: SHX5424

## 2024-01-02 LAB — POCT I-STAT, CHEM 8
BUN: 42 mg/dL — ABNORMAL HIGH (ref 6–20)
Calcium, Ion: 1.03 mmol/L — ABNORMAL LOW (ref 1.15–1.40)
Chloride: 93 mmol/L — ABNORMAL LOW (ref 98–111)
Creatinine, Ser: 9.1 mg/dL — ABNORMAL HIGH (ref 0.61–1.24)
Glucose, Bld: 123 mg/dL — ABNORMAL HIGH (ref 70–99)
HCT: 38 % — ABNORMAL LOW (ref 39.0–52.0)
Hemoglobin: 12.9 g/dL — ABNORMAL LOW (ref 13.0–17.0)
Potassium: 4 mmol/L (ref 3.5–5.1)
Sodium: 134 mmol/L — ABNORMAL LOW (ref 135–145)
TCO2: 29 mmol/L (ref 22–32)

## 2024-01-02 SURGERY — COLONOSCOPY
Anesthesia: General

## 2024-01-02 MED ORDER — PROPOFOL 10 MG/ML IV BOLUS
INTRAVENOUS | Status: DC | PRN
  Administered 2024-01-02: 100 mg via INTRAVENOUS

## 2024-01-02 MED ORDER — SODIUM CHLORIDE 0.9 % IV SOLN: INTRAVENOUS | Status: DC | PRN

## 2024-01-02 MED ORDER — PROPOFOL 500 MG/50ML IV EMUL
INTRAVENOUS | Status: DC | PRN
  Administered 2024-01-02: 150 ug/kg/min via INTRAVENOUS

## 2024-01-02 MED ORDER — LIDOCAINE 2% (20 MG/ML) 5 ML SYRINGE
INTRAMUSCULAR | Status: DC | PRN
  Administered 2024-01-02: 100 mg via INTRAVENOUS

## 2024-01-02 MED ORDER — PHENYLEPHRINE 80 MCG/ML (10ML) SYRINGE FOR IV PUSH (FOR BLOOD PRESSURE SUPPORT)
PREFILLED_SYRINGE | INTRAVENOUS | Status: AC
Start: 2024-01-02 — End: 2024-01-02
  Filled 2024-01-02: qty 10

## 2024-01-02 NOTE — Discharge Instructions (Addendum)
  Colonoscopy Discharge Instructions  Read the instructions outlined below and refer to this sheet in the next few weeks. These discharge instructions provide you with general information on caring for yourself after you leave the hospital. Your doctor may also give you specific instructions. While your treatment has been planned according to the most current medical practices available, unavoidable complications occasionally occur. If you have any problems or questions after discharge, call Dr. Riley Cheadle at [phone]. ACTIVITY You may resume your regular activity, but move at a slower pace for the next 24 hours.  Take frequent rest periods for the next 24 hours.  Walking will help get rid of the air and reduce the bloated feeling in your belly (abdomen).  No driving for 24 hours (because of the medicine (anesthesia) used during the test).   Do not sign any important legal documents or operate any machinery for 24 hours (because of the anesthesia used during the test).  NUTRITION Drink plenty of fluids.  You may resume your normal diet as instructed by your doctor.  Begin with a light meal and progress to your normal diet. Heavy or fried foods are harder to digest and may make you feel sick to your stomach (nauseated).  Avoid alcoholic beverages for 24 hours or as instructed.  MEDICATIONS You may resume your normal medications unless your doctor tells you otherwise.  WHAT YOU CAN EXPECT TODAY Some feelings of bloating in the abdomen.  Passage of more gas than usual.  Spotting of blood in your stool or on the toilet paper.  IF YOU HAD POLYPS REMOVED DURING THE COLONOSCOPY: No aspirin products for 7 days or as instructed.  No alcohol for 7 days or as instructed.  Eat a soft diet for the next 24 hours.  FINDING OUT THE RESULTS OF YOUR TEST Not all test results are available during your visit. If your test results are not back during the visit, make an appointment with your caregiver to find out the  results. Do not assume everything is normal if you have not heard from your caregiver or the medical facility. It is important for you to follow up on all of your test results.  SEEK IMMEDIATE MEDICAL ATTENTION IF: You have more than a spotting of blood in your stool.  Your belly is swollen (abdominal distention).  You are nauseated or vomiting.  You have a temperature over 101.  You have abdominal pain or discomfort that is severe or gets worse throughout the day.      2 polyps found and removed  Further recommendations to follow pending review of pathology report   at patient request I called [facility] at [phone] -  reviewed findings and recommendations

## 2024-01-02 NOTE — H&P (Signed)
 @LOGO @   Primary Care Physician:  Tobi Fortes, MD Primary Gastroenterologist:  Dr. Riley Cheadle  Pre-Procedure History & Physical: HPI:  [name] is a 58 y.o. male here for  first-ever screening colonoscopy.    No bowel symptoms.  No family history of colon cancer.  Past Medical History:  Diagnosis Date   CHF (congestive heart failure) (HCC)    Diabetes mellitus    ESRD (end stage renal disease) (HCC)    on dialysis- Tues- Thur- Sat @ [place] in [place].   Hypertension     Past Surgical History:  Procedure Laterality Date   A/V FISTULAGRAM Right 11/21/2021   Procedure: A/V Fistulagram;  Surgeon: Adine Hoof, MD;  Location: [facility] INVASIVE CV LAB;  Service: Cardiovascular;  Laterality: Right;   A/V FISTULAGRAM N/A 06/19/2023   Procedure: A/V Fistulagram;  Surgeon: Philipp Brawn, MD;  Location: [facility] INVASIVE CV LAB;  Service: Vascular;  Laterality: N/A;   A/V SHUNT INTERVENTION N/A 11/30/2023   Procedure: A/V SHUNT INTERVENTION;  Surgeon: Patrick Boor, MD;  Location: [facility] INVASIVE CV LAB;  Service: Cardiovascular;  Laterality: N/A;   AV FISTULA PLACEMENT Right 07/22/2021   Procedure: RIGHT  ARM FISTULA;  Surgeon: Margherita Shell, MD;  Location: [facility] OR;  Service: Vascular;  Laterality: Right;   INCISION AND DRAINAGE     INSERTION OF DIALYSIS CATHETER  07/22/2021   Procedure: INSERTION OF DIALYSIS CATHETER;  Surgeon: Margherita Shell, MD;  Location: [facility] OR;  Service: Vascular;;   KNEE SURGERY Left    PERIPHERAL VASCULAR BALLOON ANGIOPLASTY  06/19/2023   Procedure: PERIPHERAL VASCULAR BALLOON ANGIOPLASTY;  Surgeon: Philipp Brawn, MD;  Location: [facility] INVASIVE CV LAB;  Service: Vascular;;    Prior to Admission medications   Medication Sig Start Date End Date Taking? Authorizing Provider  atorvastatin  (LIPITOR) 40 MG tablet Take 1 tablet (40 mg total) by mouth daily. 11/02/23  Yes Tobi Fortes, MD  GARLIC PO Take 1 capsule by mouth daily.   Yes [provider]   Magnesium 400 MG CAPS Take by mouth.   Yes [provider]  Multiple Vitamin (MULTIVITAMIN WITH MINERALS) TABS tablet Take 1 tablet by mouth daily.   Yes [provider]  VELPHORO 500 MG chewable tablet Chew 2 tablets by mouth 3 (three) times daily with meals. 09/04/23 09/03/24 Yes [provider]  vitamin C (ASCORBIC ACID) 500 MG tablet Take 500 mg by mouth daily.   Yes [provider]  Zinc 50 MG CAPS Take 50 mg by mouth every morning.   Yes [provider]    Allergies as of 11/26/2023   (No Known Allergies)    Family History  Problem Relation Age of Onset   Dementia Mother    Diabetes Father    Cancer Maternal Uncle    Colon cancer Neg Hx     Social History   Socioeconomic History   Marital status: Married    Spouse name: Not on file   Number of children: Not on file   Years of education: Not on file   Highest education level: Not on file  Occupational History   Not on file  Tobacco Use   Smoking status: Never   Smokeless tobacco: Never  Vaping Use   Vaping status: Never Used  Substance and Sexual Activity   Alcohol use: No   Drug use: No   Sexual activity: Not on file  Other Topics Concern   Not on file  Social  History Narrative   Not on file   Social Drivers of Health   Financial Resource Strain: High Risk (11/29/2020)   Overall Financial Resource Strain (CARDIA)    Difficulty of Paying Living Expenses: Hard  Food Insecurity: Food Insecurity Present (12/29/2020)   Hunger Vital Sign    Worried About Running Out of Food in the Last Year: Sometimes true    Ran Out of Food in the Last Year: Never true  Transportation Needs: No Transportation Needs (11/29/2020)   PRAPARE - Administrator, Civil Service (Medical): No    Lack of Transportation (Non-Medical): No  Physical Activity: Not on file  Stress: Not on file  Social Connections: Not on file  Intimate Partner Violence: Not on file    Review of  Systems: See HPI, otherwise negative ROS  Physical Exam: BP (!) 155/84 (BP Location: Left Arm)   Temp 98.1 F (36.7 C)   Resp 18   SpO2 100%  General:   Alert,  Well-developed, well-nourished, pleasant and cooperative in NAD Mouth:  No deformity or lesions. Neck:  Supple; no masses or thyromegaly. No significant cervical adenopathy. Lungs:  Clear throughout to auscultation.   No wheezes, crackles, or rhonchi. No acute distress. Heart:  Regular rate and rhythm; no murmurs, clicks, rubs,  or gallops. Abdomen: Non-distended, normal bowel sounds.  Soft and nontender without appreciable mass or hepatosplenomegaly.   Impression/Plan:    58 year old gentleman here for first-ever screening colonoscopy.  The risks, benefits, limitations, alternatives and imponderables have been reviewed with the patient. Questions have been answered. All parties are agreeable.       Notice: This dictation was prepared with Dragon dictation along with smaller phrase technology. Any transcriptional errors that result from this process are unintentional and may not be corrected upon review.

## 2024-01-02 NOTE — Anesthesia Procedure Notes (Signed)
 Date/Time: 01/02/2024 9:14 AM  Performed by: Sherwin Donate, CRNAPre-anesthesia Checklist: Patient identified, Suction available, Patient being monitored and Emergency Drugs available Patient Re-evaluated:Patient Re-evaluated prior to induction Oxygen Delivery Method: Nasal cannula Induction Type: IV induction Placement Confirmation: positive ETCO2 Comments: Optiflow High Flow Seibert O2 used.

## 2024-01-02 NOTE — Transfer of Care (Signed)
 Immediate Anesthesia Transfer of Care Note  Patient: [name]  Procedure(s) Performed: COLONOSCOPY  Patient Location: Short Stay  Anesthesia Type:General  Level of Consciousness: awake  Airway & Oxygen Therapy: Patient Spontanous Breathing  Post-op Assessment: Report given to RN and Post -op Vital signs reviewed and stable  Post vital signs: Reviewed and stable  Last Vitals:  Vitals Value Taken Time  BP 135/71 01/02/24 0937  Temp 36.4 C 01/02/24 0937  Pulse 80 01/02/24 0937  Resp 24 01/02/24 0937  SpO2 100 % 01/02/24 0937    Last Pain:  Vitals:   01/02/24 0937  TempSrc: Oral  PainSc: 0-No pain         Complications: No notable events documented.

## 2024-01-02 NOTE — Op Note (Signed)
 [facility] Patient Name: [name] Procedure Date: 01/02/2024 8:58 AM MRN: [id number] Date of Birth: [birth year] Attending MD: Gemma Kelp , MD, [id number] CSN: [id number] Age: 58 Admit Type: Outpatient Procedure:                Colonoscopy Indications:              Screening for colorectal malignant neoplasm Providers:                Gemma Kelp, MD, Graydon Lazier RN, RN,                            Jolee Naval, Technician Referring MD:              Medicines:                Propofol  per Anesthesia Complications:            No immediate complications. Estimated Blood Loss:     Estimated blood loss was minimal. Procedure:                Pre-Anesthesia Assessment:                           - Prior to the procedure, a History and Physical                            was performed, and patient medications and                            allergies were reviewed. The patient's tolerance of                            previous anesthesia was also reviewed. The risks                            and benefits of the procedure and the sedation                            options and risks were discussed with the patient.                            All questions were answered, and informed consent                            was obtained. Prior Anticoagulants: The patient has                            taken no anticoagulant or antiplatelet agents. ASA                            Grade Assessment: II - A patient with mild systemic                            disease. After reviewing the risks and benefits,  the patient was deemed in satisfactory condition to                            undergo the procedure.                           After obtaining informed consent, the colonoscope                            was passed under direct vision. Throughout the                            procedure, the patient's blood pressure, pulse, and                             oxygen saturations were monitored continuously. The                            [id number]) scope was introduced through the                            anus and advanced to the the cecum, identified by                            appendiceal orifice and ileocecal valve. The                            colonoscopy was performed without difficulty. The                            patient tolerated the procedure well. The quality                            of the bowel preparation was adequate. The                            ileocecal valve, appendiceal orifice, and rectum                            were photographed. Scope In: 9:20:18 AM Scope Out: 9:34:24 AM Scope Withdrawal Time: 0 hours 8 minutes 18 seconds  Total Procedure Duration: 0 hours 14 minutes 6 seconds  Findings:      The perianal and digital rectal examinations were normal.      Two sessile polyps were found in the sigmoid colon and hepatic flexure.       The polyps were 4 to 5 mm in size. These polyps were removed with a cold       snare. Resection and retrieval were complete. Estimated blood loss was       minimal.      The exam was otherwise without abnormality on direct and retroflexion       views. Impression:               - Two 4 to 5 mm polyps in the sigmoid colon and at  the hepatic flexure, removed with a cold snare.                            Resected and retrieved.                           - The examination was otherwise normal on direct                            and retroflexion views. Moderate Sedation:      Moderate (conscious) sedation was personally administered by an       anesthesia professional. The following parameters were monitored: oxygen       saturation, heart rate, blood pressure, respiratory rate, EKG, adequacy       of pulmonary ventilation, and response to care. Recommendation:           - Patient has a contact number available for                             emergencies. The signs and symptoms of potential                            delayed complications were discussed with the                            patient. Return to normal activities tomorrow.                            Written discharge instructions were provided to the                            patient.                           - Advance diet as tolerated.                           - Continue present medications.                           - Repeat colonoscopy date to be determined after                            pending pathology results are reviewed for                            surveillance.                           - Return to GI office (date not yet determined). Procedure Code(s):        --- Professional ---                           [phone], Colonoscopy, flexible; with removal of  tumor(s), polyp(s), or other lesion(s) by snare                            technique Diagnosis Code(s):        --- Professional ---                           Z12.11, Encounter for screening for malignant                            neoplasm of colon                           D12.5, Benign neoplasm of sigmoid colon                           D12.3, Benign neoplasm of transverse colon (hepatic                            flexure or splenic flexure) CPT copyright 2022 American Medical Association. All rights reserved. The codes documented in this report are preliminary and upon coder review may  be revised to meet current compliance requirements. Windsor Hatcher. Paizlee Kinder, MD Gemma Kelp, MD 01/02/2024 9:44:24 AM This report has been signed electronically. Number of Addenda: 0

## 2024-01-02 NOTE — Anesthesia Postprocedure Evaluation (Signed)
 Anesthesia Post Note  Patient: [name]  Procedure(s) Performed: COLONOSCOPY  Patient location during evaluation: PACU Anesthesia Type: General Level of consciousness: awake and alert Pain management: pain level controlled Vital Signs Assessment: post-procedure vital signs reviewed and stable Respiratory status: spontaneous breathing, nonlabored ventilation and respiratory function stable Cardiovascular status: blood pressure returned to baseline and stable Postop Assessment: no apparent nausea or vomiting Anesthetic complications: no   There were no known notable events for this encounter.   Last Vitals:  Vitals:   01/02/24 0839 01/02/24 0937  BP: (!) 155/84 135/71  Pulse:  80  Resp: 18 (!) 24  Temp: 36.7 C 36.4 C  SpO2: 100% 100%    Last Pain:  Vitals:   01/02/24 0937  TempSrc: Oral  PainSc: 0-No pain                 Gustin Zobrist L Payden Docter

## 2024-01-03 ENCOUNTER — Encounter (HOSPITAL_COMMUNITY): Payer: Self-pay | Admitting: Internal Medicine

## 2024-01-03 ENCOUNTER — Ambulatory Visit: Payer: Self-pay | Admitting: Internal Medicine

## 2024-01-03 DIAGNOSIS — D689 Coagulation defect, unspecified: Secondary | ICD-10-CM | POA: Diagnosis not present

## 2024-01-03 DIAGNOSIS — D631 Anemia in chronic kidney disease: Secondary | ICD-10-CM | POA: Diagnosis not present

## 2024-01-03 DIAGNOSIS — Z992 Dependence on renal dialysis: Secondary | ICD-10-CM | POA: Diagnosis not present

## 2024-01-03 DIAGNOSIS — E1122 Type 2 diabetes mellitus with diabetic chronic kidney disease: Secondary | ICD-10-CM | POA: Diagnosis not present

## 2024-01-03 DIAGNOSIS — N2581 Secondary hyperparathyroidism of renal origin: Secondary | ICD-10-CM | POA: Diagnosis not present

## 2024-01-03 DIAGNOSIS — D509 Iron deficiency anemia, unspecified: Secondary | ICD-10-CM | POA: Diagnosis not present

## 2024-01-03 DIAGNOSIS — N186 End stage renal disease: Secondary | ICD-10-CM | POA: Diagnosis not present

## 2024-01-03 LAB — SURGICAL PATHOLOGY

## 2024-01-05 DIAGNOSIS — D631 Anemia in chronic kidney disease: Secondary | ICD-10-CM | POA: Diagnosis not present

## 2024-01-05 DIAGNOSIS — E1122 Type 2 diabetes mellitus with diabetic chronic kidney disease: Secondary | ICD-10-CM | POA: Diagnosis not present

## 2024-01-05 DIAGNOSIS — Z992 Dependence on renal dialysis: Secondary | ICD-10-CM | POA: Diagnosis not present

## 2024-01-05 DIAGNOSIS — D689 Coagulation defect, unspecified: Secondary | ICD-10-CM | POA: Diagnosis not present

## 2024-01-05 DIAGNOSIS — N186 End stage renal disease: Secondary | ICD-10-CM | POA: Diagnosis not present

## 2024-01-05 DIAGNOSIS — D509 Iron deficiency anemia, unspecified: Secondary | ICD-10-CM | POA: Diagnosis not present

## 2024-01-05 DIAGNOSIS — N2581 Secondary hyperparathyroidism of renal origin: Secondary | ICD-10-CM | POA: Diagnosis not present

## 2024-01-08 DIAGNOSIS — D689 Coagulation defect, unspecified: Secondary | ICD-10-CM | POA: Diagnosis not present

## 2024-01-08 DIAGNOSIS — N2581 Secondary hyperparathyroidism of renal origin: Secondary | ICD-10-CM | POA: Diagnosis not present

## 2024-01-08 DIAGNOSIS — D509 Iron deficiency anemia, unspecified: Secondary | ICD-10-CM | POA: Diagnosis not present

## 2024-01-08 DIAGNOSIS — E1122 Type 2 diabetes mellitus with diabetic chronic kidney disease: Secondary | ICD-10-CM | POA: Diagnosis not present

## 2024-01-08 DIAGNOSIS — Z992 Dependence on renal dialysis: Secondary | ICD-10-CM | POA: Diagnosis not present

## 2024-01-08 DIAGNOSIS — N186 End stage renal disease: Secondary | ICD-10-CM | POA: Diagnosis not present

## 2024-01-08 DIAGNOSIS — D631 Anemia in chronic kidney disease: Secondary | ICD-10-CM | POA: Diagnosis not present

## 2024-01-10 DIAGNOSIS — E1122 Type 2 diabetes mellitus with diabetic chronic kidney disease: Secondary | ICD-10-CM | POA: Diagnosis not present

## 2024-01-10 DIAGNOSIS — D631 Anemia in chronic kidney disease: Secondary | ICD-10-CM | POA: Diagnosis not present

## 2024-01-10 DIAGNOSIS — N2581 Secondary hyperparathyroidism of renal origin: Secondary | ICD-10-CM | POA: Diagnosis not present

## 2024-01-10 DIAGNOSIS — D689 Coagulation defect, unspecified: Secondary | ICD-10-CM | POA: Diagnosis not present

## 2024-01-10 DIAGNOSIS — D509 Iron deficiency anemia, unspecified: Secondary | ICD-10-CM | POA: Diagnosis not present

## 2024-01-10 DIAGNOSIS — N186 End stage renal disease: Secondary | ICD-10-CM | POA: Diagnosis not present

## 2024-01-10 DIAGNOSIS — Z992 Dependence on renal dialysis: Secondary | ICD-10-CM | POA: Diagnosis not present

## 2024-01-12 DIAGNOSIS — D689 Coagulation defect, unspecified: Secondary | ICD-10-CM | POA: Diagnosis not present

## 2024-01-12 DIAGNOSIS — Z992 Dependence on renal dialysis: Secondary | ICD-10-CM | POA: Diagnosis not present

## 2024-01-12 DIAGNOSIS — D631 Anemia in chronic kidney disease: Secondary | ICD-10-CM | POA: Diagnosis not present

## 2024-01-12 DIAGNOSIS — E1122 Type 2 diabetes mellitus with diabetic chronic kidney disease: Secondary | ICD-10-CM | POA: Diagnosis not present

## 2024-01-12 DIAGNOSIS — N186 End stage renal disease: Secondary | ICD-10-CM | POA: Diagnosis not present

## 2024-01-12 DIAGNOSIS — N2581 Secondary hyperparathyroidism of renal origin: Secondary | ICD-10-CM | POA: Diagnosis not present

## 2024-01-12 DIAGNOSIS — D509 Iron deficiency anemia, unspecified: Secondary | ICD-10-CM | POA: Diagnosis not present

## 2024-01-15 DIAGNOSIS — N2581 Secondary hyperparathyroidism of renal origin: Secondary | ICD-10-CM | POA: Diagnosis not present

## 2024-01-15 DIAGNOSIS — D509 Iron deficiency anemia, unspecified: Secondary | ICD-10-CM | POA: Diagnosis not present

## 2024-01-15 DIAGNOSIS — N186 End stage renal disease: Secondary | ICD-10-CM | POA: Diagnosis not present

## 2024-01-15 DIAGNOSIS — Z992 Dependence on renal dialysis: Secondary | ICD-10-CM | POA: Diagnosis not present

## 2024-01-15 DIAGNOSIS — E1122 Type 2 diabetes mellitus with diabetic chronic kidney disease: Secondary | ICD-10-CM | POA: Diagnosis not present

## 2024-01-15 DIAGNOSIS — D689 Coagulation defect, unspecified: Secondary | ICD-10-CM | POA: Diagnosis not present

## 2024-01-15 DIAGNOSIS — D631 Anemia in chronic kidney disease: Secondary | ICD-10-CM | POA: Diagnosis not present

## 2024-01-17 DIAGNOSIS — Z992 Dependence on renal dialysis: Secondary | ICD-10-CM | POA: Diagnosis not present

## 2024-01-17 DIAGNOSIS — D509 Iron deficiency anemia, unspecified: Secondary | ICD-10-CM | POA: Diagnosis not present

## 2024-01-17 DIAGNOSIS — N2581 Secondary hyperparathyroidism of renal origin: Secondary | ICD-10-CM | POA: Diagnosis not present

## 2024-01-17 DIAGNOSIS — N186 End stage renal disease: Secondary | ICD-10-CM | POA: Diagnosis not present

## 2024-01-17 DIAGNOSIS — D689 Coagulation defect, unspecified: Secondary | ICD-10-CM | POA: Diagnosis not present

## 2024-01-17 DIAGNOSIS — D631 Anemia in chronic kidney disease: Secondary | ICD-10-CM | POA: Diagnosis not present

## 2024-01-17 DIAGNOSIS — E1122 Type 2 diabetes mellitus with diabetic chronic kidney disease: Secondary | ICD-10-CM | POA: Diagnosis not present

## 2024-01-19 DIAGNOSIS — D631 Anemia in chronic kidney disease: Secondary | ICD-10-CM | POA: Diagnosis not present

## 2024-01-19 DIAGNOSIS — D509 Iron deficiency anemia, unspecified: Secondary | ICD-10-CM | POA: Diagnosis not present

## 2024-01-19 DIAGNOSIS — D689 Coagulation defect, unspecified: Secondary | ICD-10-CM | POA: Diagnosis not present

## 2024-01-19 DIAGNOSIS — Z992 Dependence on renal dialysis: Secondary | ICD-10-CM | POA: Diagnosis not present

## 2024-01-19 DIAGNOSIS — N2581 Secondary hyperparathyroidism of renal origin: Secondary | ICD-10-CM | POA: Diagnosis not present

## 2024-01-19 DIAGNOSIS — N186 End stage renal disease: Secondary | ICD-10-CM | POA: Diagnosis not present

## 2024-01-19 DIAGNOSIS — E1122 Type 2 diabetes mellitus with diabetic chronic kidney disease: Secondary | ICD-10-CM | POA: Diagnosis not present

## 2024-01-21 DIAGNOSIS — E1122 Type 2 diabetes mellitus with diabetic chronic kidney disease: Secondary | ICD-10-CM | POA: Diagnosis not present

## 2024-01-21 DIAGNOSIS — N186 End stage renal disease: Secondary | ICD-10-CM | POA: Diagnosis not present

## 2024-01-21 DIAGNOSIS — Z992 Dependence on renal dialysis: Secondary | ICD-10-CM | POA: Diagnosis not present

## 2024-01-22 DIAGNOSIS — E1122 Type 2 diabetes mellitus with diabetic chronic kidney disease: Secondary | ICD-10-CM | POA: Diagnosis not present

## 2024-01-22 DIAGNOSIS — N186 End stage renal disease: Secondary | ICD-10-CM | POA: Diagnosis not present

## 2024-01-22 DIAGNOSIS — D631 Anemia in chronic kidney disease: Secondary | ICD-10-CM | POA: Diagnosis not present

## 2024-01-22 DIAGNOSIS — N2581 Secondary hyperparathyroidism of renal origin: Secondary | ICD-10-CM | POA: Diagnosis not present

## 2024-01-22 DIAGNOSIS — D689 Coagulation defect, unspecified: Secondary | ICD-10-CM | POA: Diagnosis not present

## 2024-01-22 DIAGNOSIS — Z992 Dependence on renal dialysis: Secondary | ICD-10-CM | POA: Diagnosis not present

## 2024-01-23 DIAGNOSIS — H52223 Regular astigmatism, bilateral: Secondary | ICD-10-CM | POA: Diagnosis not present

## 2024-01-24 DIAGNOSIS — D631 Anemia in chronic kidney disease: Secondary | ICD-10-CM | POA: Diagnosis not present

## 2024-01-24 DIAGNOSIS — E1122 Type 2 diabetes mellitus with diabetic chronic kidney disease: Secondary | ICD-10-CM | POA: Diagnosis not present

## 2024-01-24 DIAGNOSIS — N2581 Secondary hyperparathyroidism of renal origin: Secondary | ICD-10-CM | POA: Diagnosis not present

## 2024-01-24 DIAGNOSIS — Z992 Dependence on renal dialysis: Secondary | ICD-10-CM | POA: Diagnosis not present

## 2024-01-24 DIAGNOSIS — D689 Coagulation defect, unspecified: Secondary | ICD-10-CM | POA: Diagnosis not present

## 2024-01-24 DIAGNOSIS — N186 End stage renal disease: Secondary | ICD-10-CM | POA: Diagnosis not present

## 2024-01-26 DIAGNOSIS — N186 End stage renal disease: Secondary | ICD-10-CM | POA: Diagnosis not present

## 2024-01-26 DIAGNOSIS — N2581 Secondary hyperparathyroidism of renal origin: Secondary | ICD-10-CM | POA: Diagnosis not present

## 2024-01-26 DIAGNOSIS — Z992 Dependence on renal dialysis: Secondary | ICD-10-CM | POA: Diagnosis not present

## 2024-01-26 DIAGNOSIS — D631 Anemia in chronic kidney disease: Secondary | ICD-10-CM | POA: Diagnosis not present

## 2024-01-26 DIAGNOSIS — D689 Coagulation defect, unspecified: Secondary | ICD-10-CM | POA: Diagnosis not present

## 2024-01-26 DIAGNOSIS — E1122 Type 2 diabetes mellitus with diabetic chronic kidney disease: Secondary | ICD-10-CM | POA: Diagnosis not present

## 2024-01-29 DIAGNOSIS — N2581 Secondary hyperparathyroidism of renal origin: Secondary | ICD-10-CM | POA: Diagnosis not present

## 2024-01-29 DIAGNOSIS — E1122 Type 2 diabetes mellitus with diabetic chronic kidney disease: Secondary | ICD-10-CM | POA: Diagnosis not present

## 2024-01-29 DIAGNOSIS — Z992 Dependence on renal dialysis: Secondary | ICD-10-CM | POA: Diagnosis not present

## 2024-01-29 DIAGNOSIS — N186 End stage renal disease: Secondary | ICD-10-CM | POA: Diagnosis not present

## 2024-01-29 DIAGNOSIS — D631 Anemia in chronic kidney disease: Secondary | ICD-10-CM | POA: Diagnosis not present

## 2024-01-29 DIAGNOSIS — D689 Coagulation defect, unspecified: Secondary | ICD-10-CM | POA: Diagnosis not present

## 2024-01-31 DIAGNOSIS — Z992 Dependence on renal dialysis: Secondary | ICD-10-CM | POA: Diagnosis not present

## 2024-01-31 DIAGNOSIS — N186 End stage renal disease: Secondary | ICD-10-CM | POA: Diagnosis not present

## 2024-01-31 DIAGNOSIS — N2581 Secondary hyperparathyroidism of renal origin: Secondary | ICD-10-CM | POA: Diagnosis not present

## 2024-01-31 DIAGNOSIS — D689 Coagulation defect, unspecified: Secondary | ICD-10-CM | POA: Diagnosis not present

## 2024-01-31 DIAGNOSIS — E1122 Type 2 diabetes mellitus with diabetic chronic kidney disease: Secondary | ICD-10-CM | POA: Diagnosis not present

## 2024-01-31 DIAGNOSIS — D631 Anemia in chronic kidney disease: Secondary | ICD-10-CM | POA: Diagnosis not present

## 2024-02-02 DIAGNOSIS — N2581 Secondary hyperparathyroidism of renal origin: Secondary | ICD-10-CM | POA: Diagnosis not present

## 2024-02-02 DIAGNOSIS — D689 Coagulation defect, unspecified: Secondary | ICD-10-CM | POA: Diagnosis not present

## 2024-02-02 DIAGNOSIS — Z992 Dependence on renal dialysis: Secondary | ICD-10-CM | POA: Diagnosis not present

## 2024-02-02 DIAGNOSIS — N186 End stage renal disease: Secondary | ICD-10-CM | POA: Diagnosis not present

## 2024-02-02 DIAGNOSIS — D631 Anemia in chronic kidney disease: Secondary | ICD-10-CM | POA: Diagnosis not present

## 2024-02-02 DIAGNOSIS — E1122 Type 2 diabetes mellitus with diabetic chronic kidney disease: Secondary | ICD-10-CM | POA: Diagnosis not present

## 2024-02-04 ENCOUNTER — Ambulatory Visit

## 2024-02-04 VITALS — BP 144/81 | HR 90 | Ht 71.0 in | Wt 289.1 lb

## 2024-02-04 DIAGNOSIS — E1121 Type 2 diabetes mellitus with diabetic nephropathy: Secondary | ICD-10-CM | POA: Diagnosis not present

## 2024-02-04 DIAGNOSIS — I10 Essential (primary) hypertension: Secondary | ICD-10-CM

## 2024-02-04 DIAGNOSIS — E785 Hyperlipidemia, unspecified: Secondary | ICD-10-CM

## 2024-02-04 DIAGNOSIS — R252 Cramp and spasm: Secondary | ICD-10-CM | POA: Diagnosis not present

## 2024-02-04 DIAGNOSIS — E039 Hypothyroidism, unspecified: Secondary | ICD-10-CM

## 2024-02-04 DIAGNOSIS — E559 Vitamin D deficiency, unspecified: Secondary | ICD-10-CM

## 2024-02-04 DIAGNOSIS — D509 Iron deficiency anemia, unspecified: Secondary | ICD-10-CM | POA: Diagnosis not present

## 2024-02-04 DIAGNOSIS — E1169 Type 2 diabetes mellitus with other specified complication: Secondary | ICD-10-CM | POA: Diagnosis not present

## 2024-02-04 NOTE — Progress Notes (Signed)
 Established Patient Office Visit  Subjective   Patient ID: [name], male    DOB: 04-May-1966  Age: 58 y.o. MRN: [id number]  Chief Complaint  Patient presents with   Medical Management of Chronic Issues    3 month follow up    HPI  Patient Active Problem List   Diagnosis Date Noted   Vitamin D  deficiency 02/04/2024   Chronic diastolic heart failure (HCC) 05/02/2023   ESRD (end stage renal disease) on dialysis (HCC) 05/02/2023   Hyperlipidemia associated with type 2 diabetes mellitus (HCC) 05/02/2023   OSA (obstructive sleep apnea) 05/02/2023   Colon cancer screening 05/02/2023   ESRD on hemodialysis (HCC) 10/16/2022   Other disorders of phosphorus metabolism 05/31/2022   Dyspnea, unspecified 08/02/2021   Hypertensive heart and chronic kidney disease with heart failure and with stage 5 chronic kidney disease, or end stage renal disease (HCC) 08/02/2021   Hypothyroidism, unspecified 08/02/2021   Iron deficiency anemia, unspecified 08/02/2021   Nausea 08/02/2021   Renal osteodystrophy 08/02/2021   Pruritus, unspecified 08/02/2021   Class 2 obesity due to excess calories with body mass index (BMI) of 38.0 to 38.9 in adult    Protein calorie malnutrition (HCC) 06/14/2020   Essential hypertension    Type 2 diabetes with nephropathy (HCC)    Class 2 obesity       ROS    Objective:     BP (!) 144/81   Pulse 90   Ht 5' 11 (1.803 m)   Wt 289 lb 1.9 oz (131.1 kg)   SpO2 (!) 85%   BMI 40.32 kg/m  BP Readings from Last 3 Encounters:  02/04/24 (!) 144/81  01/02/24 135/71  11/30/23 (!) 156/89   Wt Readings from Last 3 Encounters:  02/04/24 289 lb 1.9 oz (131.1 kg)  12/31/23 274 lb (124.3 kg)  11/26/23 292 lb 12.8 oz (132.8 kg)     Physical Exam Vitals and nursing note reviewed.  Constitutional:      Appearance: Normal appearance. He is obese.  HENT:     Head: Normocephalic.  Eyes:     Extraocular Movements: Extraocular movements intact.     Pupils:  Pupils are equal, round, and reactive to light.  Cardiovascular:     Rate and Rhythm: Normal rate and regular rhythm.  Pulmonary:     Effort: Pulmonary effort is normal.     Breath sounds: Normal breath sounds.  Musculoskeletal:     Cervical back: Normal range of motion and neck supple.  Neurological:     Mental Status: He is alert and oriented to person, place, and time.  Psychiatric:        Mood and Affect: Mood normal.        Thought Content: Thought content normal.    No results found for any visits on 02/04/24.  Last CBC Lab Results  Component Value Date   WBC 6.7 07/27/2023   HGB 12.9 (L) 01/02/2024   HCT 38.0 (L) 01/02/2024   MCV 87 07/27/2023   MCH 28.3 07/27/2023   RDW 14.2 07/27/2023   PLT 269 07/27/2023   Last metabolic panel Lab Results  Component Value Date   GLUCOSE 123 (H) 01/02/2024   NA 134 (L) 01/02/2024   K 4.0 01/02/2024   CL 93 (L) 01/02/2024   CO2 29 07/27/2023   BUN 42 (H) 01/02/2024   CREATININE 9.10 (H) 01/02/2024   EGFR 7 (L) 07/27/2023   CALCIUM  9.4 07/27/2023   PHOS 3.8 08/02/2021  PROT 7.4 07/27/2023   ALBUMIN 4.1 07/27/2023   LABGLOB 3.3 07/27/2023   AGRATIO 1.0 12/29/2020   BILITOT 0.2 07/27/2023   ALKPHOS 90 07/27/2023   AST 12 07/27/2023   ALT 13 07/27/2023   ANIONGAP 10 08/02/2021   Last lipids Lab Results  Component Value Date   CHOL 246 (H) 07/27/2023   HDL 62 07/27/2023   LDLCALC 168 (H) 07/27/2023   TRIG 94 07/27/2023   CHOLHDL 4.0 07/27/2023   Last hemoglobin A1c Lab Results  Component Value Date   HGBA1C 8.5 (H) 07/27/2023      The 10-year ASCVD risk score (Arnett DK, et al., 2019) is: 16.9%    Assessment & Plan:   Problem List Items Addressed This Visit       Cardiovascular and Mediastinum   Essential hypertension   Remains mildly elevated today but acceptable.  He experiences borderline low readings with HD.  Per cardiology, would not add any antihypertensive therapy.        Endocrine   Type 2  diabetes with nephropathy (HCC) - Primary   Repeat A1c today.  Not on medication for this.  Follow-up according to results.       Relevant Orders   CMP14+EGFR   HgB A1c   Hyperlipidemia associated with type 2 diabetes mellitus (HCC)   Check fasting labs today.  He endorsed cramping, but states that he has had this all his life, so not related to statin.  No change in treatment at this time.        Relevant Orders   CMP14+EGFR   Lipid Profile   Hypothyroidism, unspecified   Recheck thyroid  levels.  Not on medication for this.         Other   Iron deficiency anemia, unspecified   Recheck levels.        Vitamin D  deficiency   Recheck levels.        Relevant Orders   Vitamin D  (25 hydroxy)   Other Visit Diagnoses       Muscle cramping       Relevant Orders   Magnesium       Return in about 3 months (around 05/06/2024).    Leita Longs, FNP

## 2024-02-04 NOTE — Assessment & Plan Note (Signed)
 Recheck thyroid  levels.  Not on medication for this.

## 2024-02-04 NOTE — Assessment & Plan Note (Signed)
 Recheck levels

## 2024-02-04 NOTE — Assessment & Plan Note (Signed)
 Remains mildly elevated today but acceptable.  He experiences borderline low readings with HD.  Per cardiology, would not add any antihypertensive therapy.

## 2024-02-04 NOTE — Assessment & Plan Note (Signed)
 Check fasting labs today.  He endorsed cramping, but states that he has had this all his life, so not related to statin.  No change in treatment at this time.

## 2024-02-04 NOTE — Assessment & Plan Note (Signed)
 Repeat A1c today.  Not on medication for this.  Follow-up according to results.

## 2024-02-05 DIAGNOSIS — E1122 Type 2 diabetes mellitus with diabetic chronic kidney disease: Secondary | ICD-10-CM | POA: Diagnosis not present

## 2024-02-05 DIAGNOSIS — Z992 Dependence on renal dialysis: Secondary | ICD-10-CM | POA: Diagnosis not present

## 2024-02-05 DIAGNOSIS — D689 Coagulation defect, unspecified: Secondary | ICD-10-CM | POA: Diagnosis not present

## 2024-02-05 DIAGNOSIS — N2581 Secondary hyperparathyroidism of renal origin: Secondary | ICD-10-CM | POA: Diagnosis not present

## 2024-02-05 DIAGNOSIS — N186 End stage renal disease: Secondary | ICD-10-CM | POA: Diagnosis not present

## 2024-02-05 DIAGNOSIS — D631 Anemia in chronic kidney disease: Secondary | ICD-10-CM | POA: Diagnosis not present

## 2024-02-05 LAB — CMP14+EGFR
ALT: 10 IU/L (ref 0–44)
AST: 13 IU/L (ref 0–40)
Albumin: 4.3 g/dL (ref 3.8–4.9)
Alkaline Phosphatase: 102 IU/L (ref 44–121)
BUN/Creatinine Ratio: 8 — ABNORMAL LOW (ref 9–20)
BUN: 71 mg/dL — ABNORMAL HIGH (ref 6–24)
Bilirubin Total: 0.2 mg/dL (ref 0.0–1.2)
CO2: 20 mmol/L (ref 20–29)
Calcium: 9.7 mg/dL (ref 8.7–10.2)
Chloride: 96 mmol/L (ref 96–106)
Creatinine, Ser: 9.18 mg/dL — ABNORMAL HIGH (ref 0.76–1.27)
Globulin, Total: 2.8 g/dL (ref 1.5–4.5)
Glucose: 173 mg/dL — ABNORMAL HIGH (ref 70–99)
Potassium: 4.9 mmol/L (ref 3.5–5.2)
Sodium: 137 mmol/L (ref 134–144)
Total Protein: 7.1 g/dL (ref 6.0–8.5)
eGFR: 6 mL/min/1.73 — ABNORMAL LOW (ref >59)

## 2024-02-05 LAB — VITAMIN D 25 HYDROXY (VIT D DEFICIENCY, FRACTURES): Vit D, 25-Hydroxy: 19.1 ng/mL — ABNORMAL LOW (ref 30.0–100.0)

## 2024-02-05 LAB — LIPID PANEL
Chol/HDL Ratio: 4.1 ratio (ref 0.0–5.0)
Cholesterol, Total: 230 mg/dL — ABNORMAL HIGH (ref 100–199)
HDL: 56 mg/dL (ref >39)
LDL Chol Calc (NIH): 160 mg/dL — ABNORMAL HIGH (ref 0–99)
Triglycerides: 81 mg/dL (ref 0–149)
VLDL Cholesterol Cal: 14 mg/dL (ref 5–40)

## 2024-02-05 LAB — MAGNESIUM: Magnesium: 2.3 mg/dL (ref 1.6–2.3)

## 2024-02-05 LAB — HEMOGLOBIN A1C
Est. average glucose Bld gHb Est-mCnc: 180 mg/dL
Hgb A1c MFr Bld: 7.9 % — ABNORMAL HIGH (ref 4.8–5.6)

## 2024-02-07 DIAGNOSIS — N2581 Secondary hyperparathyroidism of renal origin: Secondary | ICD-10-CM | POA: Diagnosis not present

## 2024-02-07 DIAGNOSIS — Z992 Dependence on renal dialysis: Secondary | ICD-10-CM | POA: Diagnosis not present

## 2024-02-07 DIAGNOSIS — D689 Coagulation defect, unspecified: Secondary | ICD-10-CM | POA: Diagnosis not present

## 2024-02-07 DIAGNOSIS — N186 End stage renal disease: Secondary | ICD-10-CM | POA: Diagnosis not present

## 2024-02-07 DIAGNOSIS — E1122 Type 2 diabetes mellitus with diabetic chronic kidney disease: Secondary | ICD-10-CM | POA: Diagnosis not present

## 2024-02-07 DIAGNOSIS — D631 Anemia in chronic kidney disease: Secondary | ICD-10-CM | POA: Diagnosis not present

## 2024-02-09 DIAGNOSIS — E1122 Type 2 diabetes mellitus with diabetic chronic kidney disease: Secondary | ICD-10-CM | POA: Diagnosis not present

## 2024-02-09 DIAGNOSIS — D689 Coagulation defect, unspecified: Secondary | ICD-10-CM | POA: Diagnosis not present

## 2024-02-09 DIAGNOSIS — N186 End stage renal disease: Secondary | ICD-10-CM | POA: Diagnosis not present

## 2024-02-09 DIAGNOSIS — D631 Anemia in chronic kidney disease: Secondary | ICD-10-CM | POA: Diagnosis not present

## 2024-02-09 DIAGNOSIS — N2581 Secondary hyperparathyroidism of renal origin: Secondary | ICD-10-CM | POA: Diagnosis not present

## 2024-02-09 DIAGNOSIS — Z992 Dependence on renal dialysis: Secondary | ICD-10-CM | POA: Diagnosis not present

## 2024-02-12 DIAGNOSIS — N2581 Secondary hyperparathyroidism of renal origin: Secondary | ICD-10-CM | POA: Diagnosis not present

## 2024-02-12 DIAGNOSIS — N186 End stage renal disease: Secondary | ICD-10-CM | POA: Diagnosis not present

## 2024-02-12 DIAGNOSIS — D689 Coagulation defect, unspecified: Secondary | ICD-10-CM | POA: Diagnosis not present

## 2024-02-12 DIAGNOSIS — Z992 Dependence on renal dialysis: Secondary | ICD-10-CM | POA: Diagnosis not present

## 2024-02-12 DIAGNOSIS — E1122 Type 2 diabetes mellitus with diabetic chronic kidney disease: Secondary | ICD-10-CM | POA: Diagnosis not present

## 2024-02-12 DIAGNOSIS — D631 Anemia in chronic kidney disease: Secondary | ICD-10-CM | POA: Diagnosis not present

## 2024-02-14 DIAGNOSIS — D689 Coagulation defect, unspecified: Secondary | ICD-10-CM | POA: Diagnosis not present

## 2024-02-14 DIAGNOSIS — E1122 Type 2 diabetes mellitus with diabetic chronic kidney disease: Secondary | ICD-10-CM | POA: Diagnosis not present

## 2024-02-14 DIAGNOSIS — Z992 Dependence on renal dialysis: Secondary | ICD-10-CM | POA: Diagnosis not present

## 2024-02-14 DIAGNOSIS — N2581 Secondary hyperparathyroidism of renal origin: Secondary | ICD-10-CM | POA: Diagnosis not present

## 2024-02-14 DIAGNOSIS — D631 Anemia in chronic kidney disease: Secondary | ICD-10-CM | POA: Diagnosis not present

## 2024-02-14 DIAGNOSIS — N186 End stage renal disease: Secondary | ICD-10-CM | POA: Diagnosis not present

## 2024-02-16 DIAGNOSIS — D631 Anemia in chronic kidney disease: Secondary | ICD-10-CM | POA: Diagnosis not present

## 2024-02-16 DIAGNOSIS — N186 End stage renal disease: Secondary | ICD-10-CM | POA: Diagnosis not present

## 2024-02-16 DIAGNOSIS — E1122 Type 2 diabetes mellitus with diabetic chronic kidney disease: Secondary | ICD-10-CM | POA: Diagnosis not present

## 2024-02-16 DIAGNOSIS — D689 Coagulation defect, unspecified: Secondary | ICD-10-CM | POA: Diagnosis not present

## 2024-02-16 DIAGNOSIS — Z992 Dependence on renal dialysis: Secondary | ICD-10-CM | POA: Diagnosis not present

## 2024-02-16 DIAGNOSIS — N2581 Secondary hyperparathyroidism of renal origin: Secondary | ICD-10-CM | POA: Diagnosis not present

## 2024-02-19 DIAGNOSIS — D631 Anemia in chronic kidney disease: Secondary | ICD-10-CM | POA: Diagnosis not present

## 2024-02-19 DIAGNOSIS — Z992 Dependence on renal dialysis: Secondary | ICD-10-CM | POA: Diagnosis not present

## 2024-02-19 DIAGNOSIS — N186 End stage renal disease: Secondary | ICD-10-CM | POA: Diagnosis not present

## 2024-02-19 DIAGNOSIS — N2581 Secondary hyperparathyroidism of renal origin: Secondary | ICD-10-CM | POA: Diagnosis not present

## 2024-02-19 DIAGNOSIS — E1122 Type 2 diabetes mellitus with diabetic chronic kidney disease: Secondary | ICD-10-CM | POA: Diagnosis not present

## 2024-02-19 DIAGNOSIS — D689 Coagulation defect, unspecified: Secondary | ICD-10-CM | POA: Diagnosis not present

## 2024-02-21 DIAGNOSIS — D689 Coagulation defect, unspecified: Secondary | ICD-10-CM | POA: Diagnosis not present

## 2024-02-21 DIAGNOSIS — N2581 Secondary hyperparathyroidism of renal origin: Secondary | ICD-10-CM | POA: Diagnosis not present

## 2024-02-21 DIAGNOSIS — Z992 Dependence on renal dialysis: Secondary | ICD-10-CM | POA: Diagnosis not present

## 2024-02-21 DIAGNOSIS — E1122 Type 2 diabetes mellitus with diabetic chronic kidney disease: Secondary | ICD-10-CM | POA: Diagnosis not present

## 2024-02-21 DIAGNOSIS — N186 End stage renal disease: Secondary | ICD-10-CM | POA: Diagnosis not present

## 2024-02-21 DIAGNOSIS — D631 Anemia in chronic kidney disease: Secondary | ICD-10-CM | POA: Diagnosis not present

## 2024-02-23 DIAGNOSIS — Z992 Dependence on renal dialysis: Secondary | ICD-10-CM | POA: Diagnosis not present

## 2024-02-23 DIAGNOSIS — E039 Hypothyroidism, unspecified: Secondary | ICD-10-CM | POA: Diagnosis not present

## 2024-02-23 DIAGNOSIS — E1122 Type 2 diabetes mellitus with diabetic chronic kidney disease: Secondary | ICD-10-CM | POA: Diagnosis not present

## 2024-02-23 DIAGNOSIS — D689 Coagulation defect, unspecified: Secondary | ICD-10-CM | POA: Diagnosis not present

## 2024-02-23 DIAGNOSIS — N186 End stage renal disease: Secondary | ICD-10-CM | POA: Diagnosis not present

## 2024-02-23 DIAGNOSIS — N2581 Secondary hyperparathyroidism of renal origin: Secondary | ICD-10-CM | POA: Diagnosis not present

## 2024-02-25 DIAGNOSIS — N186 End stage renal disease: Secondary | ICD-10-CM | POA: Diagnosis not present

## 2024-02-25 DIAGNOSIS — N2581 Secondary hyperparathyroidism of renal origin: Secondary | ICD-10-CM | POA: Diagnosis not present

## 2024-02-25 DIAGNOSIS — E039 Hypothyroidism, unspecified: Secondary | ICD-10-CM | POA: Diagnosis not present

## 2024-02-25 DIAGNOSIS — E1122 Type 2 diabetes mellitus with diabetic chronic kidney disease: Secondary | ICD-10-CM | POA: Diagnosis not present

## 2024-02-25 DIAGNOSIS — D689 Coagulation defect, unspecified: Secondary | ICD-10-CM | POA: Diagnosis not present

## 2024-02-25 DIAGNOSIS — Z992 Dependence on renal dialysis: Secondary | ICD-10-CM | POA: Diagnosis not present

## 2024-02-26 ENCOUNTER — Other Ambulatory Visit (HOSPITAL_COMMUNITY): Payer: Self-pay

## 2024-03-04 DIAGNOSIS — N2581 Secondary hyperparathyroidism of renal origin: Secondary | ICD-10-CM | POA: Diagnosis not present

## 2024-03-04 DIAGNOSIS — E1122 Type 2 diabetes mellitus with diabetic chronic kidney disease: Secondary | ICD-10-CM | POA: Diagnosis not present

## 2024-03-04 DIAGNOSIS — N186 End stage renal disease: Secondary | ICD-10-CM | POA: Diagnosis not present

## 2024-03-04 DIAGNOSIS — Z992 Dependence on renal dialysis: Secondary | ICD-10-CM | POA: Diagnosis not present

## 2024-03-04 DIAGNOSIS — D689 Coagulation defect, unspecified: Secondary | ICD-10-CM | POA: Diagnosis not present

## 2024-03-04 DIAGNOSIS — E039 Hypothyroidism, unspecified: Secondary | ICD-10-CM | POA: Diagnosis not present

## 2024-03-06 DIAGNOSIS — Z992 Dependence on renal dialysis: Secondary | ICD-10-CM | POA: Diagnosis not present

## 2024-03-06 DIAGNOSIS — N2581 Secondary hyperparathyroidism of renal origin: Secondary | ICD-10-CM | POA: Diagnosis not present

## 2024-03-06 DIAGNOSIS — N186 End stage renal disease: Secondary | ICD-10-CM | POA: Diagnosis not present

## 2024-03-06 DIAGNOSIS — E039 Hypothyroidism, unspecified: Secondary | ICD-10-CM | POA: Diagnosis not present

## 2024-03-06 DIAGNOSIS — D689 Coagulation defect, unspecified: Secondary | ICD-10-CM | POA: Diagnosis not present

## 2024-03-06 DIAGNOSIS — E1122 Type 2 diabetes mellitus with diabetic chronic kidney disease: Secondary | ICD-10-CM | POA: Diagnosis not present

## 2024-03-08 DIAGNOSIS — N2581 Secondary hyperparathyroidism of renal origin: Secondary | ICD-10-CM | POA: Diagnosis not present

## 2024-03-08 DIAGNOSIS — E1122 Type 2 diabetes mellitus with diabetic chronic kidney disease: Secondary | ICD-10-CM | POA: Diagnosis not present

## 2024-03-08 DIAGNOSIS — E039 Hypothyroidism, unspecified: Secondary | ICD-10-CM | POA: Diagnosis not present

## 2024-03-08 DIAGNOSIS — Z992 Dependence on renal dialysis: Secondary | ICD-10-CM | POA: Diagnosis not present

## 2024-03-08 DIAGNOSIS — N186 End stage renal disease: Secondary | ICD-10-CM | POA: Diagnosis not present

## 2024-03-08 DIAGNOSIS — D689 Coagulation defect, unspecified: Secondary | ICD-10-CM | POA: Diagnosis not present

## 2024-03-10 ENCOUNTER — Encounter (INDEPENDENT_AMBULATORY_CARE_PROVIDER_SITE_OTHER): Payer: Self-pay | Admitting: Ophthalmology

## 2024-03-11 ENCOUNTER — Ambulatory Visit: Payer: Self-pay

## 2024-03-11 DIAGNOSIS — N2581 Secondary hyperparathyroidism of renal origin: Secondary | ICD-10-CM | POA: Diagnosis not present

## 2024-03-11 DIAGNOSIS — E1122 Type 2 diabetes mellitus with diabetic chronic kidney disease: Secondary | ICD-10-CM | POA: Diagnosis not present

## 2024-03-11 DIAGNOSIS — E039 Hypothyroidism, unspecified: Secondary | ICD-10-CM | POA: Diagnosis not present

## 2024-03-11 DIAGNOSIS — D689 Coagulation defect, unspecified: Secondary | ICD-10-CM | POA: Diagnosis not present

## 2024-03-11 DIAGNOSIS — N186 End stage renal disease: Secondary | ICD-10-CM | POA: Diagnosis not present

## 2024-03-11 DIAGNOSIS — Z992 Dependence on renal dialysis: Secondary | ICD-10-CM | POA: Diagnosis not present

## 2024-03-12 DIAGNOSIS — E1122 Type 2 diabetes mellitus with diabetic chronic kidney disease: Secondary | ICD-10-CM | POA: Diagnosis not present

## 2024-03-12 DIAGNOSIS — D689 Coagulation defect, unspecified: Secondary | ICD-10-CM | POA: Diagnosis not present

## 2024-03-12 DIAGNOSIS — E039 Hypothyroidism, unspecified: Secondary | ICD-10-CM | POA: Diagnosis not present

## 2024-03-12 DIAGNOSIS — Z992 Dependence on renal dialysis: Secondary | ICD-10-CM | POA: Diagnosis not present

## 2024-03-12 DIAGNOSIS — N186 End stage renal disease: Secondary | ICD-10-CM | POA: Diagnosis not present

## 2024-03-12 DIAGNOSIS — N2581 Secondary hyperparathyroidism of renal origin: Secondary | ICD-10-CM | POA: Diagnosis not present

## 2024-03-13 DIAGNOSIS — D689 Coagulation defect, unspecified: Secondary | ICD-10-CM | POA: Diagnosis not present

## 2024-03-13 DIAGNOSIS — N2581 Secondary hyperparathyroidism of renal origin: Secondary | ICD-10-CM | POA: Diagnosis not present

## 2024-03-13 DIAGNOSIS — E039 Hypothyroidism, unspecified: Secondary | ICD-10-CM | POA: Diagnosis not present

## 2024-03-13 DIAGNOSIS — N186 End stage renal disease: Secondary | ICD-10-CM | POA: Diagnosis not present

## 2024-03-13 DIAGNOSIS — E1122 Type 2 diabetes mellitus with diabetic chronic kidney disease: Secondary | ICD-10-CM | POA: Diagnosis not present

## 2024-03-13 DIAGNOSIS — Z992 Dependence on renal dialysis: Secondary | ICD-10-CM | POA: Diagnosis not present

## 2024-03-15 DIAGNOSIS — E1122 Type 2 diabetes mellitus with diabetic chronic kidney disease: Secondary | ICD-10-CM | POA: Diagnosis not present

## 2024-03-15 DIAGNOSIS — N2581 Secondary hyperparathyroidism of renal origin: Secondary | ICD-10-CM | POA: Diagnosis not present

## 2024-03-15 DIAGNOSIS — Z992 Dependence on renal dialysis: Secondary | ICD-10-CM | POA: Diagnosis not present

## 2024-03-15 DIAGNOSIS — N186 End stage renal disease: Secondary | ICD-10-CM | POA: Diagnosis not present

## 2024-03-15 DIAGNOSIS — E039 Hypothyroidism, unspecified: Secondary | ICD-10-CM | POA: Diagnosis not present

## 2024-03-15 DIAGNOSIS — D689 Coagulation defect, unspecified: Secondary | ICD-10-CM | POA: Diagnosis not present

## 2024-03-18 DIAGNOSIS — E1122 Type 2 diabetes mellitus with diabetic chronic kidney disease: Secondary | ICD-10-CM | POA: Diagnosis not present

## 2024-03-18 DIAGNOSIS — D689 Coagulation defect, unspecified: Secondary | ICD-10-CM | POA: Diagnosis not present

## 2024-03-18 DIAGNOSIS — N186 End stage renal disease: Secondary | ICD-10-CM | POA: Diagnosis not present

## 2024-03-18 DIAGNOSIS — E039 Hypothyroidism, unspecified: Secondary | ICD-10-CM | POA: Diagnosis not present

## 2024-03-18 DIAGNOSIS — Z992 Dependence on renal dialysis: Secondary | ICD-10-CM | POA: Diagnosis not present

## 2024-03-18 DIAGNOSIS — N2581 Secondary hyperparathyroidism of renal origin: Secondary | ICD-10-CM | POA: Diagnosis not present

## 2024-03-20 DIAGNOSIS — D689 Coagulation defect, unspecified: Secondary | ICD-10-CM | POA: Diagnosis not present

## 2024-03-20 DIAGNOSIS — N2581 Secondary hyperparathyroidism of renal origin: Secondary | ICD-10-CM | POA: Diagnosis not present

## 2024-03-20 DIAGNOSIS — E1122 Type 2 diabetes mellitus with diabetic chronic kidney disease: Secondary | ICD-10-CM | POA: Diagnosis not present

## 2024-03-20 DIAGNOSIS — N186 End stage renal disease: Secondary | ICD-10-CM | POA: Diagnosis not present

## 2024-03-20 DIAGNOSIS — Z992 Dependence on renal dialysis: Secondary | ICD-10-CM | POA: Diagnosis not present

## 2024-03-20 DIAGNOSIS — E039 Hypothyroidism, unspecified: Secondary | ICD-10-CM | POA: Diagnosis not present

## 2024-03-22 DIAGNOSIS — E039 Hypothyroidism, unspecified: Secondary | ICD-10-CM | POA: Diagnosis not present

## 2024-03-22 DIAGNOSIS — E1122 Type 2 diabetes mellitus with diabetic chronic kidney disease: Secondary | ICD-10-CM | POA: Diagnosis not present

## 2024-03-22 DIAGNOSIS — D689 Coagulation defect, unspecified: Secondary | ICD-10-CM | POA: Diagnosis not present

## 2024-03-22 DIAGNOSIS — N2581 Secondary hyperparathyroidism of renal origin: Secondary | ICD-10-CM | POA: Diagnosis not present

## 2024-03-22 DIAGNOSIS — Z992 Dependence on renal dialysis: Secondary | ICD-10-CM | POA: Diagnosis not present

## 2024-03-22 DIAGNOSIS — N186 End stage renal disease: Secondary | ICD-10-CM | POA: Diagnosis not present

## 2024-03-23 DIAGNOSIS — Z992 Dependence on renal dialysis: Secondary | ICD-10-CM | POA: Diagnosis not present

## 2024-03-23 DIAGNOSIS — N186 End stage renal disease: Secondary | ICD-10-CM | POA: Diagnosis not present

## 2024-03-23 DIAGNOSIS — E1122 Type 2 diabetes mellitus with diabetic chronic kidney disease: Secondary | ICD-10-CM | POA: Diagnosis not present

## 2024-03-25 DIAGNOSIS — N186 End stage renal disease: Secondary | ICD-10-CM | POA: Diagnosis not present

## 2024-03-25 DIAGNOSIS — D689 Coagulation defect, unspecified: Secondary | ICD-10-CM | POA: Diagnosis not present

## 2024-03-25 DIAGNOSIS — D631 Anemia in chronic kidney disease: Secondary | ICD-10-CM | POA: Diagnosis not present

## 2024-03-25 DIAGNOSIS — N2581 Secondary hyperparathyroidism of renal origin: Secondary | ICD-10-CM | POA: Diagnosis not present

## 2024-03-25 DIAGNOSIS — E1122 Type 2 diabetes mellitus with diabetic chronic kidney disease: Secondary | ICD-10-CM | POA: Diagnosis not present

## 2024-03-25 DIAGNOSIS — Z992 Dependence on renal dialysis: Secondary | ICD-10-CM | POA: Diagnosis not present

## 2024-03-26 ENCOUNTER — Encounter (INDEPENDENT_AMBULATORY_CARE_PROVIDER_SITE_OTHER): Admitting: Ophthalmology

## 2024-03-27 DIAGNOSIS — N186 End stage renal disease: Secondary | ICD-10-CM | POA: Diagnosis not present

## 2024-03-27 DIAGNOSIS — E1122 Type 2 diabetes mellitus with diabetic chronic kidney disease: Secondary | ICD-10-CM | POA: Diagnosis not present

## 2024-03-27 DIAGNOSIS — D689 Coagulation defect, unspecified: Secondary | ICD-10-CM | POA: Diagnosis not present

## 2024-03-27 DIAGNOSIS — Z992 Dependence on renal dialysis: Secondary | ICD-10-CM | POA: Diagnosis not present

## 2024-03-27 DIAGNOSIS — N2581 Secondary hyperparathyroidism of renal origin: Secondary | ICD-10-CM | POA: Diagnosis not present

## 2024-03-27 DIAGNOSIS — D631 Anemia in chronic kidney disease: Secondary | ICD-10-CM | POA: Diagnosis not present

## 2024-03-29 DIAGNOSIS — D689 Coagulation defect, unspecified: Secondary | ICD-10-CM | POA: Diagnosis not present

## 2024-03-29 DIAGNOSIS — D631 Anemia in chronic kidney disease: Secondary | ICD-10-CM | POA: Diagnosis not present

## 2024-03-29 DIAGNOSIS — N186 End stage renal disease: Secondary | ICD-10-CM | POA: Diagnosis not present

## 2024-03-29 DIAGNOSIS — Z992 Dependence on renal dialysis: Secondary | ICD-10-CM | POA: Diagnosis not present

## 2024-03-29 DIAGNOSIS — N2581 Secondary hyperparathyroidism of renal origin: Secondary | ICD-10-CM | POA: Diagnosis not present

## 2024-03-29 DIAGNOSIS — E1122 Type 2 diabetes mellitus with diabetic chronic kidney disease: Secondary | ICD-10-CM | POA: Diagnosis not present

## 2024-04-01 DIAGNOSIS — N186 End stage renal disease: Secondary | ICD-10-CM | POA: Diagnosis not present

## 2024-04-01 DIAGNOSIS — Z992 Dependence on renal dialysis: Secondary | ICD-10-CM | POA: Diagnosis not present

## 2024-04-01 DIAGNOSIS — N2581 Secondary hyperparathyroidism of renal origin: Secondary | ICD-10-CM | POA: Diagnosis not present

## 2024-04-01 DIAGNOSIS — E1122 Type 2 diabetes mellitus with diabetic chronic kidney disease: Secondary | ICD-10-CM | POA: Diagnosis not present

## 2024-04-01 DIAGNOSIS — D631 Anemia in chronic kidney disease: Secondary | ICD-10-CM | POA: Diagnosis not present

## 2024-04-01 DIAGNOSIS — D689 Coagulation defect, unspecified: Secondary | ICD-10-CM | POA: Diagnosis not present

## 2024-04-03 DIAGNOSIS — Z992 Dependence on renal dialysis: Secondary | ICD-10-CM | POA: Diagnosis not present

## 2024-04-03 DIAGNOSIS — D631 Anemia in chronic kidney disease: Secondary | ICD-10-CM | POA: Diagnosis not present

## 2024-04-03 DIAGNOSIS — E1122 Type 2 diabetes mellitus with diabetic chronic kidney disease: Secondary | ICD-10-CM | POA: Diagnosis not present

## 2024-04-03 DIAGNOSIS — N186 End stage renal disease: Secondary | ICD-10-CM | POA: Diagnosis not present

## 2024-04-03 DIAGNOSIS — N2581 Secondary hyperparathyroidism of renal origin: Secondary | ICD-10-CM | POA: Diagnosis not present

## 2024-04-03 DIAGNOSIS — D689 Coagulation defect, unspecified: Secondary | ICD-10-CM | POA: Diagnosis not present

## 2024-04-05 DIAGNOSIS — Z992 Dependence on renal dialysis: Secondary | ICD-10-CM | POA: Diagnosis not present

## 2024-04-05 DIAGNOSIS — D631 Anemia in chronic kidney disease: Secondary | ICD-10-CM | POA: Diagnosis not present

## 2024-04-05 DIAGNOSIS — N186 End stage renal disease: Secondary | ICD-10-CM | POA: Diagnosis not present

## 2024-04-05 DIAGNOSIS — N2581 Secondary hyperparathyroidism of renal origin: Secondary | ICD-10-CM | POA: Diagnosis not present

## 2024-04-05 DIAGNOSIS — D689 Coagulation defect, unspecified: Secondary | ICD-10-CM | POA: Diagnosis not present

## 2024-04-05 DIAGNOSIS — E1122 Type 2 diabetes mellitus with diabetic chronic kidney disease: Secondary | ICD-10-CM | POA: Diagnosis not present

## 2024-04-08 DIAGNOSIS — N186 End stage renal disease: Secondary | ICD-10-CM | POA: Diagnosis not present

## 2024-04-08 DIAGNOSIS — D689 Coagulation defect, unspecified: Secondary | ICD-10-CM | POA: Diagnosis not present

## 2024-04-08 DIAGNOSIS — E1122 Type 2 diabetes mellitus with diabetic chronic kidney disease: Secondary | ICD-10-CM | POA: Diagnosis not present

## 2024-04-08 DIAGNOSIS — D631 Anemia in chronic kidney disease: Secondary | ICD-10-CM | POA: Diagnosis not present

## 2024-04-08 DIAGNOSIS — Z992 Dependence on renal dialysis: Secondary | ICD-10-CM | POA: Diagnosis not present

## 2024-04-08 DIAGNOSIS — N2581 Secondary hyperparathyroidism of renal origin: Secondary | ICD-10-CM | POA: Diagnosis not present

## 2024-04-10 DIAGNOSIS — Z992 Dependence on renal dialysis: Secondary | ICD-10-CM | POA: Diagnosis not present

## 2024-04-10 DIAGNOSIS — N2581 Secondary hyperparathyroidism of renal origin: Secondary | ICD-10-CM | POA: Diagnosis not present

## 2024-04-10 DIAGNOSIS — D689 Coagulation defect, unspecified: Secondary | ICD-10-CM | POA: Diagnosis not present

## 2024-04-10 DIAGNOSIS — E1122 Type 2 diabetes mellitus with diabetic chronic kidney disease: Secondary | ICD-10-CM | POA: Diagnosis not present

## 2024-04-10 DIAGNOSIS — N186 End stage renal disease: Secondary | ICD-10-CM | POA: Diagnosis not present

## 2024-04-10 DIAGNOSIS — D631 Anemia in chronic kidney disease: Secondary | ICD-10-CM | POA: Diagnosis not present

## 2024-04-12 DIAGNOSIS — N186 End stage renal disease: Secondary | ICD-10-CM | POA: Diagnosis not present

## 2024-04-12 DIAGNOSIS — D689 Coagulation defect, unspecified: Secondary | ICD-10-CM | POA: Diagnosis not present

## 2024-04-12 DIAGNOSIS — E1122 Type 2 diabetes mellitus with diabetic chronic kidney disease: Secondary | ICD-10-CM | POA: Diagnosis not present

## 2024-04-12 DIAGNOSIS — Z992 Dependence on renal dialysis: Secondary | ICD-10-CM | POA: Diagnosis not present

## 2024-04-12 DIAGNOSIS — D631 Anemia in chronic kidney disease: Secondary | ICD-10-CM | POA: Diagnosis not present

## 2024-04-12 DIAGNOSIS — N2581 Secondary hyperparathyroidism of renal origin: Secondary | ICD-10-CM | POA: Diagnosis not present

## 2024-04-15 DIAGNOSIS — N2581 Secondary hyperparathyroidism of renal origin: Secondary | ICD-10-CM | POA: Diagnosis not present

## 2024-04-15 DIAGNOSIS — D689 Coagulation defect, unspecified: Secondary | ICD-10-CM | POA: Diagnosis not present

## 2024-04-15 DIAGNOSIS — D631 Anemia in chronic kidney disease: Secondary | ICD-10-CM | POA: Diagnosis not present

## 2024-04-15 DIAGNOSIS — Z992 Dependence on renal dialysis: Secondary | ICD-10-CM | POA: Diagnosis not present

## 2024-04-15 DIAGNOSIS — E1122 Type 2 diabetes mellitus with diabetic chronic kidney disease: Secondary | ICD-10-CM | POA: Diagnosis not present

## 2024-04-15 DIAGNOSIS — N186 End stage renal disease: Secondary | ICD-10-CM | POA: Diagnosis not present

## 2024-04-17 DIAGNOSIS — N186 End stage renal disease: Secondary | ICD-10-CM | POA: Diagnosis not present

## 2024-04-17 DIAGNOSIS — E1122 Type 2 diabetes mellitus with diabetic chronic kidney disease: Secondary | ICD-10-CM | POA: Diagnosis not present

## 2024-04-17 DIAGNOSIS — Z992 Dependence on renal dialysis: Secondary | ICD-10-CM | POA: Diagnosis not present

## 2024-04-17 DIAGNOSIS — N2581 Secondary hyperparathyroidism of renal origin: Secondary | ICD-10-CM | POA: Diagnosis not present

## 2024-04-17 DIAGNOSIS — D631 Anemia in chronic kidney disease: Secondary | ICD-10-CM | POA: Diagnosis not present

## 2024-04-17 DIAGNOSIS — D689 Coagulation defect, unspecified: Secondary | ICD-10-CM | POA: Diagnosis not present

## 2024-04-19 DIAGNOSIS — E1122 Type 2 diabetes mellitus with diabetic chronic kidney disease: Secondary | ICD-10-CM | POA: Diagnosis not present

## 2024-04-19 DIAGNOSIS — N186 End stage renal disease: Secondary | ICD-10-CM | POA: Diagnosis not present

## 2024-04-19 DIAGNOSIS — Z992 Dependence on renal dialysis: Secondary | ICD-10-CM | POA: Diagnosis not present

## 2024-04-19 DIAGNOSIS — N2581 Secondary hyperparathyroidism of renal origin: Secondary | ICD-10-CM | POA: Diagnosis not present

## 2024-04-19 DIAGNOSIS — D689 Coagulation defect, unspecified: Secondary | ICD-10-CM | POA: Diagnosis not present

## 2024-04-19 DIAGNOSIS — D631 Anemia in chronic kidney disease: Secondary | ICD-10-CM | POA: Diagnosis not present

## 2024-04-22 DIAGNOSIS — Z992 Dependence on renal dialysis: Secondary | ICD-10-CM | POA: Diagnosis not present

## 2024-04-22 DIAGNOSIS — N2581 Secondary hyperparathyroidism of renal origin: Secondary | ICD-10-CM | POA: Diagnosis not present

## 2024-04-22 DIAGNOSIS — N186 End stage renal disease: Secondary | ICD-10-CM | POA: Diagnosis not present

## 2024-04-22 DIAGNOSIS — D689 Coagulation defect, unspecified: Secondary | ICD-10-CM | POA: Diagnosis not present

## 2024-04-22 DIAGNOSIS — E1122 Type 2 diabetes mellitus with diabetic chronic kidney disease: Secondary | ICD-10-CM | POA: Diagnosis not present

## 2024-04-22 DIAGNOSIS — D631 Anemia in chronic kidney disease: Secondary | ICD-10-CM | POA: Diagnosis not present

## 2024-04-24 DIAGNOSIS — N2581 Secondary hyperparathyroidism of renal origin: Secondary | ICD-10-CM | POA: Diagnosis not present

## 2024-04-24 DIAGNOSIS — D631 Anemia in chronic kidney disease: Secondary | ICD-10-CM | POA: Diagnosis not present

## 2024-04-24 DIAGNOSIS — D689 Coagulation defect, unspecified: Secondary | ICD-10-CM | POA: Diagnosis not present

## 2024-04-24 DIAGNOSIS — N186 End stage renal disease: Secondary | ICD-10-CM | POA: Diagnosis not present

## 2024-04-24 DIAGNOSIS — E1122 Type 2 diabetes mellitus with diabetic chronic kidney disease: Secondary | ICD-10-CM | POA: Diagnosis not present

## 2024-04-24 DIAGNOSIS — Z992 Dependence on renal dialysis: Secondary | ICD-10-CM | POA: Diagnosis not present

## 2024-04-26 DIAGNOSIS — N2581 Secondary hyperparathyroidism of renal origin: Secondary | ICD-10-CM | POA: Diagnosis not present

## 2024-04-26 DIAGNOSIS — D631 Anemia in chronic kidney disease: Secondary | ICD-10-CM | POA: Diagnosis not present

## 2024-04-26 DIAGNOSIS — E1122 Type 2 diabetes mellitus with diabetic chronic kidney disease: Secondary | ICD-10-CM | POA: Diagnosis not present

## 2024-04-26 DIAGNOSIS — Z992 Dependence on renal dialysis: Secondary | ICD-10-CM | POA: Diagnosis not present

## 2024-04-26 DIAGNOSIS — D689 Coagulation defect, unspecified: Secondary | ICD-10-CM | POA: Diagnosis not present

## 2024-04-26 DIAGNOSIS — N186 End stage renal disease: Secondary | ICD-10-CM | POA: Diagnosis not present

## 2024-04-29 DIAGNOSIS — D631 Anemia in chronic kidney disease: Secondary | ICD-10-CM | POA: Diagnosis not present

## 2024-04-29 DIAGNOSIS — N186 End stage renal disease: Secondary | ICD-10-CM | POA: Diagnosis not present

## 2024-04-29 DIAGNOSIS — Z992 Dependence on renal dialysis: Secondary | ICD-10-CM | POA: Diagnosis not present

## 2024-04-29 DIAGNOSIS — N2581 Secondary hyperparathyroidism of renal origin: Secondary | ICD-10-CM | POA: Diagnosis not present

## 2024-04-29 DIAGNOSIS — D689 Coagulation defect, unspecified: Secondary | ICD-10-CM | POA: Diagnosis not present

## 2024-04-29 DIAGNOSIS — E1122 Type 2 diabetes mellitus with diabetic chronic kidney disease: Secondary | ICD-10-CM | POA: Diagnosis not present

## 2024-05-01 DIAGNOSIS — N2581 Secondary hyperparathyroidism of renal origin: Secondary | ICD-10-CM | POA: Diagnosis not present

## 2024-05-01 DIAGNOSIS — D631 Anemia in chronic kidney disease: Secondary | ICD-10-CM | POA: Diagnosis not present

## 2024-05-01 DIAGNOSIS — Z992 Dependence on renal dialysis: Secondary | ICD-10-CM | POA: Diagnosis not present

## 2024-05-01 DIAGNOSIS — N186 End stage renal disease: Secondary | ICD-10-CM | POA: Diagnosis not present

## 2024-05-01 DIAGNOSIS — E1122 Type 2 diabetes mellitus with diabetic chronic kidney disease: Secondary | ICD-10-CM | POA: Diagnosis not present

## 2024-05-01 DIAGNOSIS — D689 Coagulation defect, unspecified: Secondary | ICD-10-CM | POA: Diagnosis not present

## 2024-05-03 DIAGNOSIS — Z992 Dependence on renal dialysis: Secondary | ICD-10-CM | POA: Diagnosis not present

## 2024-05-03 DIAGNOSIS — E1122 Type 2 diabetes mellitus with diabetic chronic kidney disease: Secondary | ICD-10-CM | POA: Diagnosis not present

## 2024-05-03 DIAGNOSIS — N2581 Secondary hyperparathyroidism of renal origin: Secondary | ICD-10-CM | POA: Diagnosis not present

## 2024-05-03 DIAGNOSIS — D689 Coagulation defect, unspecified: Secondary | ICD-10-CM | POA: Diagnosis not present

## 2024-05-03 DIAGNOSIS — N186 End stage renal disease: Secondary | ICD-10-CM | POA: Diagnosis not present

## 2024-05-03 DIAGNOSIS — D631 Anemia in chronic kidney disease: Secondary | ICD-10-CM | POA: Diagnosis not present

## 2024-05-06 DIAGNOSIS — N186 End stage renal disease: Secondary | ICD-10-CM | POA: Diagnosis not present

## 2024-05-06 DIAGNOSIS — D689 Coagulation defect, unspecified: Secondary | ICD-10-CM | POA: Diagnosis not present

## 2024-05-06 DIAGNOSIS — D631 Anemia in chronic kidney disease: Secondary | ICD-10-CM | POA: Diagnosis not present

## 2024-05-06 DIAGNOSIS — N2581 Secondary hyperparathyroidism of renal origin: Secondary | ICD-10-CM | POA: Diagnosis not present

## 2024-05-06 DIAGNOSIS — E1122 Type 2 diabetes mellitus with diabetic chronic kidney disease: Secondary | ICD-10-CM | POA: Diagnosis not present

## 2024-05-06 DIAGNOSIS — Z992 Dependence on renal dialysis: Secondary | ICD-10-CM | POA: Diagnosis not present

## 2024-05-08 ENCOUNTER — Ambulatory Visit

## 2024-05-08 DIAGNOSIS — N186 End stage renal disease: Secondary | ICD-10-CM | POA: Diagnosis not present

## 2024-05-08 DIAGNOSIS — E1122 Type 2 diabetes mellitus with diabetic chronic kidney disease: Secondary | ICD-10-CM | POA: Diagnosis not present

## 2024-05-08 DIAGNOSIS — D631 Anemia in chronic kidney disease: Secondary | ICD-10-CM | POA: Diagnosis not present

## 2024-05-08 DIAGNOSIS — Z992 Dependence on renal dialysis: Secondary | ICD-10-CM | POA: Diagnosis not present

## 2024-05-08 DIAGNOSIS — D689 Coagulation defect, unspecified: Secondary | ICD-10-CM | POA: Diagnosis not present

## 2024-05-08 DIAGNOSIS — N2581 Secondary hyperparathyroidism of renal origin: Secondary | ICD-10-CM | POA: Diagnosis not present

## 2024-05-10 DIAGNOSIS — N2581 Secondary hyperparathyroidism of renal origin: Secondary | ICD-10-CM | POA: Diagnosis not present

## 2024-05-10 DIAGNOSIS — Z992 Dependence on renal dialysis: Secondary | ICD-10-CM | POA: Diagnosis not present

## 2024-05-10 DIAGNOSIS — N186 End stage renal disease: Secondary | ICD-10-CM | POA: Diagnosis not present

## 2024-05-10 DIAGNOSIS — D689 Coagulation defect, unspecified: Secondary | ICD-10-CM | POA: Diagnosis not present

## 2024-05-10 DIAGNOSIS — D631 Anemia in chronic kidney disease: Secondary | ICD-10-CM | POA: Diagnosis not present

## 2024-05-10 DIAGNOSIS — E1122 Type 2 diabetes mellitus with diabetic chronic kidney disease: Secondary | ICD-10-CM | POA: Diagnosis not present

## 2024-05-13 DIAGNOSIS — D689 Coagulation defect, unspecified: Secondary | ICD-10-CM | POA: Diagnosis not present

## 2024-05-13 DIAGNOSIS — E1122 Type 2 diabetes mellitus with diabetic chronic kidney disease: Secondary | ICD-10-CM | POA: Diagnosis not present

## 2024-05-13 DIAGNOSIS — N186 End stage renal disease: Secondary | ICD-10-CM | POA: Diagnosis not present

## 2024-05-13 DIAGNOSIS — N2581 Secondary hyperparathyroidism of renal origin: Secondary | ICD-10-CM | POA: Diagnosis not present

## 2024-05-13 DIAGNOSIS — D631 Anemia in chronic kidney disease: Secondary | ICD-10-CM | POA: Diagnosis not present

## 2024-05-13 DIAGNOSIS — Z992 Dependence on renal dialysis: Secondary | ICD-10-CM | POA: Diagnosis not present

## 2024-05-15 DIAGNOSIS — Z992 Dependence on renal dialysis: Secondary | ICD-10-CM | POA: Diagnosis not present

## 2024-05-15 DIAGNOSIS — D631 Anemia in chronic kidney disease: Secondary | ICD-10-CM | POA: Diagnosis not present

## 2024-05-15 DIAGNOSIS — N186 End stage renal disease: Secondary | ICD-10-CM | POA: Diagnosis not present

## 2024-05-15 DIAGNOSIS — E1122 Type 2 diabetes mellitus with diabetic chronic kidney disease: Secondary | ICD-10-CM | POA: Diagnosis not present

## 2024-05-15 DIAGNOSIS — D689 Coagulation defect, unspecified: Secondary | ICD-10-CM | POA: Diagnosis not present

## 2024-05-15 DIAGNOSIS — N2581 Secondary hyperparathyroidism of renal origin: Secondary | ICD-10-CM | POA: Diagnosis not present

## 2024-05-17 DIAGNOSIS — E1122 Type 2 diabetes mellitus with diabetic chronic kidney disease: Secondary | ICD-10-CM | POA: Diagnosis not present

## 2024-05-17 DIAGNOSIS — D689 Coagulation defect, unspecified: Secondary | ICD-10-CM | POA: Diagnosis not present

## 2024-05-17 DIAGNOSIS — D631 Anemia in chronic kidney disease: Secondary | ICD-10-CM | POA: Diagnosis not present

## 2024-05-17 DIAGNOSIS — N2581 Secondary hyperparathyroidism of renal origin: Secondary | ICD-10-CM | POA: Diagnosis not present

## 2024-05-17 DIAGNOSIS — N186 End stage renal disease: Secondary | ICD-10-CM | POA: Diagnosis not present

## 2024-05-17 DIAGNOSIS — Z992 Dependence on renal dialysis: Secondary | ICD-10-CM | POA: Diagnosis not present

## 2024-05-20 DIAGNOSIS — D631 Anemia in chronic kidney disease: Secondary | ICD-10-CM | POA: Diagnosis not present

## 2024-05-20 DIAGNOSIS — N2581 Secondary hyperparathyroidism of renal origin: Secondary | ICD-10-CM | POA: Diagnosis not present

## 2024-05-20 DIAGNOSIS — D689 Coagulation defect, unspecified: Secondary | ICD-10-CM | POA: Diagnosis not present

## 2024-05-20 DIAGNOSIS — E1122 Type 2 diabetes mellitus with diabetic chronic kidney disease: Secondary | ICD-10-CM | POA: Diagnosis not present

## 2024-05-20 DIAGNOSIS — N186 End stage renal disease: Secondary | ICD-10-CM | POA: Diagnosis not present

## 2024-05-20 DIAGNOSIS — Z992 Dependence on renal dialysis: Secondary | ICD-10-CM | POA: Diagnosis not present

## 2024-05-21 ENCOUNTER — Ambulatory Visit: Attending: Student | Admitting: Student

## 2024-05-21 NOTE — Progress Notes (Deleted)
   Cardiology Office Note    Date:  05/21/2024  ID:  [name], DOB 03/24/1966, MRN [id number] Cardiologist: Alvan Carrier, MD Advanced Heart Failure:  Toribio Fuel, MD { :  History of Present Illness:    [name] is a 58 y.o. male with past medical history of chronic HFpEF, HTN, HLD, Type II DM and ESRD who presents to the office today for overdue 75-month follow-up.  He was last examined by Dr. Alvan in 07/2023 and he denied any chest pain, shortness of breath or lower extremity edema at that time. Fluid was being managed by dialysis. Due to hypotension during dialysis, Labetalol  and Hydralazine  had been discontinued. He had mistakenly been taking both Pravastatin  and Atorvastatin , therefore Pravastatin  was discontinued. No additional changes were made to his medications at that time.   - titrate Atorva?  ROS: ***  Studies Reviewed:   EKG: EKG is*** ordered today and demonstrates ***   EKG Interpretation Date/Time:    Ventricular Rate:    PR Interval:    QRS Duration:    QT Interval:    QTC Calculation:   R Axis:      Text Interpretation:         Echocardiogram: 09/2022 IMPRESSIONS     1. Left ventricular ejection fraction, by estimation, is 55 to 60%. The  left ventricle has normal function. Left ventricular endocardial border  not optimally defined to evaluate regional wall motion. Left ventricular  diastolic parameters are consistent  with Grade I diastolic dysfunction (impaired relaxation).   2. Right ventricular systolic function is normal. The right ventricular  size is normal. Tricuspid regurgitation signal is inadequate for assessing  PA pressure.   3. The mitral valve is normal in structure. Trivial mitral valve  regurgitation.   4. The aortic valve is tricuspid. Aortic valve regurgitation is not  visualized. Aortic valve sclerosis is present, with no evidence of aortic  valve stenosis.   5. The inferior vena cava is normal in size  with greater than 50%  respiratory variability, suggesting right atrial pressure of 3 mmHg.   Comparison(s): No significant change from prior study.    Risk Assessment/Calculations:   {Does this patient have ATRIAL FIBRILLATION?:[phone]} No BP recorded.  {Refresh Note OR Click here to enter BP  :1}***         Physical Exam:   VS:  There were no vitals taken for this visit.   Wt Readings from Last 3 Encounters:  02/04/24 289 lb 1.9 oz (131.1 kg)  12/31/23 274 lb (124.3 kg)  11/26/23 292 lb 12.8 oz (132.8 kg)     GEN: Well nourished, well developed in no acute distress NECK: No JVD; No carotid bruits CARDIAC: ***RRR, no murmurs, rubs, gallops RESPIRATORY:  Clear to auscultation without rales, wheezing or rhonchi  ABDOMEN: Appears non-distended. No obvious abdominal masses. EXTREMITIES: No clubbing or cyanosis. No edema.  Distal pedal pulses are 2+ bilaterally.   Assessment and Plan:   1. Chronic heart failure with preserved ejection fraction (HFpEF) (HCC) - Most recent echocardiogram in 09/2022 showed a preserved EF of 55 to 60% with grade 1 diastolic dysfunction and normal RV function. ***  2. History of Essential hypertension - BP is at *** during today's visit.   3. Mixed hyperlipidemia - FLP in 01/2024 showed total cholesterol 230, triglycerides 81, HDL 56 and LDL 160. ***  4. ESRD (end stage renal disease) on dialysis [facility]) ***   Signed, Laymon CHRISTELLA Qua, PA-C

## 2024-05-22 DIAGNOSIS — E1122 Type 2 diabetes mellitus with diabetic chronic kidney disease: Secondary | ICD-10-CM | POA: Diagnosis not present

## 2024-05-22 DIAGNOSIS — D689 Coagulation defect, unspecified: Secondary | ICD-10-CM | POA: Diagnosis not present

## 2024-05-22 DIAGNOSIS — N186 End stage renal disease: Secondary | ICD-10-CM | POA: Diagnosis not present

## 2024-05-22 DIAGNOSIS — Z992 Dependence on renal dialysis: Secondary | ICD-10-CM | POA: Diagnosis not present

## 2024-05-22 DIAGNOSIS — N2581 Secondary hyperparathyroidism of renal origin: Secondary | ICD-10-CM | POA: Diagnosis not present

## 2024-05-22 DIAGNOSIS — D631 Anemia in chronic kidney disease: Secondary | ICD-10-CM | POA: Diagnosis not present

## 2024-05-23 DIAGNOSIS — E1122 Type 2 diabetes mellitus with diabetic chronic kidney disease: Secondary | ICD-10-CM | POA: Diagnosis not present

## 2024-05-23 DIAGNOSIS — Z992 Dependence on renal dialysis: Secondary | ICD-10-CM | POA: Diagnosis not present

## 2024-05-23 DIAGNOSIS — N186 End stage renal disease: Secondary | ICD-10-CM | POA: Diagnosis not present

## 2024-05-24 DIAGNOSIS — D631 Anemia in chronic kidney disease: Secondary | ICD-10-CM | POA: Diagnosis not present

## 2024-05-24 DIAGNOSIS — D689 Coagulation defect, unspecified: Secondary | ICD-10-CM | POA: Diagnosis not present

## 2024-05-24 DIAGNOSIS — D509 Iron deficiency anemia, unspecified: Secondary | ICD-10-CM | POA: Diagnosis not present

## 2024-05-24 DIAGNOSIS — N186 End stage renal disease: Secondary | ICD-10-CM | POA: Diagnosis not present

## 2024-05-24 DIAGNOSIS — N2581 Secondary hyperparathyroidism of renal origin: Secondary | ICD-10-CM | POA: Diagnosis not present

## 2024-05-24 DIAGNOSIS — Z992 Dependence on renal dialysis: Secondary | ICD-10-CM | POA: Diagnosis not present

## 2024-05-27 DIAGNOSIS — N2581 Secondary hyperparathyroidism of renal origin: Secondary | ICD-10-CM | POA: Diagnosis not present

## 2024-05-27 DIAGNOSIS — D689 Coagulation defect, unspecified: Secondary | ICD-10-CM | POA: Diagnosis not present

## 2024-05-27 DIAGNOSIS — D509 Iron deficiency anemia, unspecified: Secondary | ICD-10-CM | POA: Diagnosis not present

## 2024-05-27 DIAGNOSIS — D631 Anemia in chronic kidney disease: Secondary | ICD-10-CM | POA: Diagnosis not present

## 2024-05-27 DIAGNOSIS — Z992 Dependence on renal dialysis: Secondary | ICD-10-CM | POA: Diagnosis not present

## 2024-05-27 DIAGNOSIS — N186 End stage renal disease: Secondary | ICD-10-CM | POA: Diagnosis not present

## 2024-05-29 DIAGNOSIS — D689 Coagulation defect, unspecified: Secondary | ICD-10-CM | POA: Diagnosis not present

## 2024-05-29 DIAGNOSIS — N186 End stage renal disease: Secondary | ICD-10-CM | POA: Diagnosis not present

## 2024-05-29 DIAGNOSIS — Z992 Dependence on renal dialysis: Secondary | ICD-10-CM | POA: Diagnosis not present

## 2024-05-29 DIAGNOSIS — D631 Anemia in chronic kidney disease: Secondary | ICD-10-CM | POA: Diagnosis not present

## 2024-05-29 DIAGNOSIS — N2581 Secondary hyperparathyroidism of renal origin: Secondary | ICD-10-CM | POA: Diagnosis not present

## 2024-05-29 DIAGNOSIS — D509 Iron deficiency anemia, unspecified: Secondary | ICD-10-CM | POA: Diagnosis not present

## 2024-05-30 NOTE — Progress Notes (Signed)
 [name]                                          MRN: [id number]   05/30/2024   The [facility] Quality Team Specialist reviewed this patient medical record for the purposes of chart review for care gap closure. The following were reviewed: abstraction for care gap closure-glycemic status assessment.    [facility]BCI Quality Team

## 2024-05-31 DIAGNOSIS — N2581 Secondary hyperparathyroidism of renal origin: Secondary | ICD-10-CM | POA: Diagnosis not present

## 2024-05-31 DIAGNOSIS — D689 Coagulation defect, unspecified: Secondary | ICD-10-CM | POA: Diagnosis not present

## 2024-05-31 DIAGNOSIS — D509 Iron deficiency anemia, unspecified: Secondary | ICD-10-CM | POA: Diagnosis not present

## 2024-05-31 DIAGNOSIS — Z992 Dependence on renal dialysis: Secondary | ICD-10-CM | POA: Diagnosis not present

## 2024-05-31 DIAGNOSIS — N186 End stage renal disease: Secondary | ICD-10-CM | POA: Diagnosis not present

## 2024-05-31 DIAGNOSIS — D631 Anemia in chronic kidney disease: Secondary | ICD-10-CM | POA: Diagnosis not present

## 2024-06-03 DIAGNOSIS — N186 End stage renal disease: Secondary | ICD-10-CM | POA: Diagnosis not present

## 2024-06-03 DIAGNOSIS — D689 Coagulation defect, unspecified: Secondary | ICD-10-CM | POA: Diagnosis not present

## 2024-06-03 DIAGNOSIS — N2581 Secondary hyperparathyroidism of renal origin: Secondary | ICD-10-CM | POA: Diagnosis not present

## 2024-06-03 DIAGNOSIS — D509 Iron deficiency anemia, unspecified: Secondary | ICD-10-CM | POA: Diagnosis not present

## 2024-06-03 DIAGNOSIS — Z992 Dependence on renal dialysis: Secondary | ICD-10-CM | POA: Diagnosis not present

## 2024-06-03 DIAGNOSIS — D631 Anemia in chronic kidney disease: Secondary | ICD-10-CM | POA: Diagnosis not present

## 2024-06-05 DIAGNOSIS — D689 Coagulation defect, unspecified: Secondary | ICD-10-CM | POA: Diagnosis not present

## 2024-06-05 DIAGNOSIS — D631 Anemia in chronic kidney disease: Secondary | ICD-10-CM | POA: Diagnosis not present

## 2024-06-05 DIAGNOSIS — N2581 Secondary hyperparathyroidism of renal origin: Secondary | ICD-10-CM | POA: Diagnosis not present

## 2024-06-05 DIAGNOSIS — N186 End stage renal disease: Secondary | ICD-10-CM | POA: Diagnosis not present

## 2024-06-05 DIAGNOSIS — D509 Iron deficiency anemia, unspecified: Secondary | ICD-10-CM | POA: Diagnosis not present

## 2024-06-05 DIAGNOSIS — Z992 Dependence on renal dialysis: Secondary | ICD-10-CM | POA: Diagnosis not present

## 2024-06-07 DIAGNOSIS — Z992 Dependence on renal dialysis: Secondary | ICD-10-CM | POA: Diagnosis not present

## 2024-06-07 DIAGNOSIS — D631 Anemia in chronic kidney disease: Secondary | ICD-10-CM | POA: Diagnosis not present

## 2024-06-07 DIAGNOSIS — N186 End stage renal disease: Secondary | ICD-10-CM | POA: Diagnosis not present

## 2024-06-07 DIAGNOSIS — D689 Coagulation defect, unspecified: Secondary | ICD-10-CM | POA: Diagnosis not present

## 2024-06-07 DIAGNOSIS — N2581 Secondary hyperparathyroidism of renal origin: Secondary | ICD-10-CM | POA: Diagnosis not present

## 2024-06-07 DIAGNOSIS — D509 Iron deficiency anemia, unspecified: Secondary | ICD-10-CM | POA: Diagnosis not present

## 2024-06-10 DIAGNOSIS — D689 Coagulation defect, unspecified: Secondary | ICD-10-CM | POA: Diagnosis not present

## 2024-06-10 DIAGNOSIS — N186 End stage renal disease: Secondary | ICD-10-CM | POA: Diagnosis not present

## 2024-06-10 DIAGNOSIS — D631 Anemia in chronic kidney disease: Secondary | ICD-10-CM | POA: Diagnosis not present

## 2024-06-10 DIAGNOSIS — Z992 Dependence on renal dialysis: Secondary | ICD-10-CM | POA: Diagnosis not present

## 2024-06-10 DIAGNOSIS — N2581 Secondary hyperparathyroidism of renal origin: Secondary | ICD-10-CM | POA: Diagnosis not present

## 2024-06-10 DIAGNOSIS — D509 Iron deficiency anemia, unspecified: Secondary | ICD-10-CM | POA: Diagnosis not present

## 2024-06-12 DIAGNOSIS — N186 End stage renal disease: Secondary | ICD-10-CM | POA: Diagnosis not present

## 2024-06-12 DIAGNOSIS — N2581 Secondary hyperparathyroidism of renal origin: Secondary | ICD-10-CM | POA: Diagnosis not present

## 2024-06-12 DIAGNOSIS — D631 Anemia in chronic kidney disease: Secondary | ICD-10-CM | POA: Diagnosis not present

## 2024-06-12 DIAGNOSIS — Z992 Dependence on renal dialysis: Secondary | ICD-10-CM | POA: Diagnosis not present

## 2024-06-12 DIAGNOSIS — D509 Iron deficiency anemia, unspecified: Secondary | ICD-10-CM | POA: Diagnosis not present

## 2024-06-12 DIAGNOSIS — D689 Coagulation defect, unspecified: Secondary | ICD-10-CM | POA: Diagnosis not present

## 2024-06-14 DIAGNOSIS — D509 Iron deficiency anemia, unspecified: Secondary | ICD-10-CM | POA: Diagnosis not present

## 2024-06-14 DIAGNOSIS — N186 End stage renal disease: Secondary | ICD-10-CM | POA: Diagnosis not present

## 2024-06-14 DIAGNOSIS — D689 Coagulation defect, unspecified: Secondary | ICD-10-CM | POA: Diagnosis not present

## 2024-06-14 DIAGNOSIS — D631 Anemia in chronic kidney disease: Secondary | ICD-10-CM | POA: Diagnosis not present

## 2024-06-14 DIAGNOSIS — Z992 Dependence on renal dialysis: Secondary | ICD-10-CM | POA: Diagnosis not present

## 2024-06-14 DIAGNOSIS — N2581 Secondary hyperparathyroidism of renal origin: Secondary | ICD-10-CM | POA: Diagnosis not present

## 2024-06-16 DIAGNOSIS — D689 Coagulation defect, unspecified: Secondary | ICD-10-CM | POA: Diagnosis not present

## 2024-06-16 DIAGNOSIS — D509 Iron deficiency anemia, unspecified: Secondary | ICD-10-CM | POA: Diagnosis not present

## 2024-06-16 DIAGNOSIS — Z992 Dependence on renal dialysis: Secondary | ICD-10-CM | POA: Diagnosis not present

## 2024-06-16 DIAGNOSIS — N186 End stage renal disease: Secondary | ICD-10-CM | POA: Diagnosis not present

## 2024-06-16 DIAGNOSIS — N2581 Secondary hyperparathyroidism of renal origin: Secondary | ICD-10-CM | POA: Diagnosis not present

## 2024-06-16 DIAGNOSIS — D631 Anemia in chronic kidney disease: Secondary | ICD-10-CM | POA: Diagnosis not present

## 2024-06-18 DIAGNOSIS — D509 Iron deficiency anemia, unspecified: Secondary | ICD-10-CM | POA: Diagnosis not present

## 2024-06-18 DIAGNOSIS — D689 Coagulation defect, unspecified: Secondary | ICD-10-CM | POA: Diagnosis not present

## 2024-06-18 DIAGNOSIS — Z992 Dependence on renal dialysis: Secondary | ICD-10-CM | POA: Diagnosis not present

## 2024-06-18 DIAGNOSIS — N186 End stage renal disease: Secondary | ICD-10-CM | POA: Diagnosis not present

## 2024-06-18 DIAGNOSIS — N2581 Secondary hyperparathyroidism of renal origin: Secondary | ICD-10-CM | POA: Diagnosis not present

## 2024-06-18 DIAGNOSIS — D631 Anemia in chronic kidney disease: Secondary | ICD-10-CM | POA: Diagnosis not present

## 2024-06-21 DIAGNOSIS — N2581 Secondary hyperparathyroidism of renal origin: Secondary | ICD-10-CM | POA: Diagnosis not present

## 2024-06-21 DIAGNOSIS — Z992 Dependence on renal dialysis: Secondary | ICD-10-CM | POA: Diagnosis not present

## 2024-06-21 DIAGNOSIS — D631 Anemia in chronic kidney disease: Secondary | ICD-10-CM | POA: Diagnosis not present

## 2024-06-21 DIAGNOSIS — N186 End stage renal disease: Secondary | ICD-10-CM | POA: Diagnosis not present

## 2024-06-21 DIAGNOSIS — D509 Iron deficiency anemia, unspecified: Secondary | ICD-10-CM | POA: Diagnosis not present

## 2024-06-21 DIAGNOSIS — D689 Coagulation defect, unspecified: Secondary | ICD-10-CM | POA: Diagnosis not present

## 2024-06-22 DIAGNOSIS — N186 End stage renal disease: Secondary | ICD-10-CM | POA: Diagnosis not present

## 2024-06-22 DIAGNOSIS — Z992 Dependence on renal dialysis: Secondary | ICD-10-CM | POA: Diagnosis not present

## 2024-06-22 DIAGNOSIS — E1122 Type 2 diabetes mellitus with diabetic chronic kidney disease: Secondary | ICD-10-CM | POA: Diagnosis not present

## 2024-07-21 ENCOUNTER — Telehealth: Payer: Self-pay | Admitting: Pharmacy Technician

## 2024-07-21 ENCOUNTER — Other Ambulatory Visit (HOSPITAL_COMMUNITY): Payer: Self-pay

## 2024-07-21 NOTE — Telephone Encounter (Signed)
 Pharmacy Patient Advocate Encounter   Received notification from Onbase that prior authorization for Ozempic  (0.25 or 0.5 MG/DOSE) 2MG /3ML pen-injectors is due for renewal.   Insurance verification completed.   The patient is insured through [employer].  Action: Medication has been discontinued. Archived Key: [id number]

## 2024-09-23 ENCOUNTER — Ambulatory Visit: Payer: Self-pay
